# Patient Record
Sex: Male | Born: 1960
Health system: Southern US, Community
[De-identification: ages and names within clinical notes are randomized; demographics above are authoritative.]

## PROBLEM LIST (undated history)

## (undated) DIAGNOSIS — K219 Gastro-esophageal reflux disease without esophagitis: Secondary | ICD-10-CM

## (undated) DIAGNOSIS — E785 Hyperlipidemia, unspecified: Secondary | ICD-10-CM

## (undated) DIAGNOSIS — F319 Bipolar disorder, unspecified: Secondary | ICD-10-CM

## (undated) DIAGNOSIS — I10 Essential (primary) hypertension: Secondary | ICD-10-CM

## (undated) DIAGNOSIS — T7840XA Allergy, unspecified, initial encounter: Secondary | ICD-10-CM

## (undated) HISTORY — DX: Essential (primary) hypertension: I10

## (undated) HISTORY — PX: EYE SURGERY: SHX253

## (undated) HISTORY — DX: Hyperlipidemia, unspecified: E78.5

## (undated) HISTORY — PX: COLONOSCOPY: SHX174

## (undated) HISTORY — DX: Bipolar disorder, unspecified: F31.9

## (undated) HISTORY — DX: Gastro-esophageal reflux disease without esophagitis: K21.9

## (undated) HISTORY — PX: UPPER GASTROINTESTINAL ENDOSCOPY: SHX188

## (undated) HISTORY — DX: Allergy, unspecified, initial encounter: T78.40XA

---

## 1995-11-17 HISTORY — PX: BACK SURGERY: SHX140

## 2002-02-25 ENCOUNTER — Encounter: Payer: Self-pay | Admitting: Internal Medicine

## 2007-04-19 HISTORY — PX: HERNIA REPAIR: SHX51

## 2007-05-29 ENCOUNTER — Ambulatory Visit: Payer: Self-pay | Admitting: Internal Medicine

## 2007-05-29 DIAGNOSIS — J329 Chronic sinusitis, unspecified: Secondary | ICD-10-CM | POA: Insufficient documentation

## 2007-05-29 DIAGNOSIS — M545 Low back pain, unspecified: Secondary | ICD-10-CM | POA: Insufficient documentation

## 2007-05-29 DIAGNOSIS — K219 Gastro-esophageal reflux disease without esophagitis: Secondary | ICD-10-CM | POA: Insufficient documentation

## 2007-05-29 DIAGNOSIS — F339 Major depressive disorder, recurrent, unspecified: Secondary | ICD-10-CM | POA: Insufficient documentation

## 2007-05-29 DIAGNOSIS — K222 Esophageal obstruction: Secondary | ICD-10-CM | POA: Insufficient documentation

## 2007-05-29 DIAGNOSIS — E785 Hyperlipidemia, unspecified: Secondary | ICD-10-CM | POA: Insufficient documentation

## 2007-05-29 DIAGNOSIS — F329 Major depressive disorder, single episode, unspecified: Secondary | ICD-10-CM

## 2007-05-29 DIAGNOSIS — K449 Diaphragmatic hernia without obstruction or gangrene: Secondary | ICD-10-CM | POA: Insufficient documentation

## 2007-06-12 ENCOUNTER — Ambulatory Visit: Payer: Self-pay | Admitting: Internal Medicine

## 2007-06-12 ENCOUNTER — Encounter: Payer: Self-pay | Admitting: Internal Medicine

## 2007-08-27 ENCOUNTER — Telehealth: Payer: Self-pay | Admitting: Internal Medicine

## 2008-03-05 ENCOUNTER — Encounter: Payer: Self-pay | Admitting: Internal Medicine

## 2010-08-31 NOTE — Assessment & Plan Note (Signed)
[name]                         GASTROENTEROLOGY OFFICE NOTE   HAWKINS, SEAMAN                    MRN:          [id number]  DATE:05/29/2007                            DOB:          12-Apr-1961    CHIEF COMPLAINT:  Dysphagia, reflux.   ASSESSMENT:  Chronic recurrent reflux and dysphagia symptoms.  Sounds  like he has had some nocturnal aspiration.  He has a known ring-like  esophageal stricture dilated in 2003 as well as a small hiatal hernia.   PLAN:  1. Try Prilosec OTC b.i.d. to see if that improves things.  2. Schedule EGD with possible esophageal dilation.  I think stricture      dilation helped in the past for a while anyway.  I may need to try      to disrupt this membranous stricture with biopsy forceps, etc.  3. Reflux precautions including trying to avoid late meals (he works a      second shift and this is somewhat problematic).  Consider elevating      the head of the bed.  We may consider metoclopramide at that time.  4. May need to change his PPI depending upon his formulary and cost      effectiveness.   HISTORY:  [name] has known reflux and GERD with stricture.  He  is having recurrent problems where he chokes and aspirates occasionally.  This seems to be worsening. He had an episode one where they actually  called the rescue squad, though he did not go to the hospital but he had  very much in the way of difficulty with breathing.  He does describe  some vague impact dysphagia. He is avoiding spicy foods.  He drinks a  lot of caffeine, 5 or 6 cups of coffee a day. He does not smoke. He is  counseled to reduce the caffeine. He has had some periumbilical pain as  well.   When he gets home from work at about 1:15 he will often eat a bowl of  cereal, perhaps some more food and fall asleep on the couch.  The  Prilosec seems to work during the day but he thinks it seems to be  wearing off.  He had a brief sample of  Nexium which works better.  He  used to be on Protonix but that became too expensive and that was  stopped and changed to the Prilosec and it sounds like these symptoms  have worsened since that time.   REVIEW OF SYSTEMS:  He does attribute some stress about the current  financial climate, etc. to some of his symptoms.  He wears eye glasses.  He has had some sinusitis problems.  He has been a little anxious as  well. Had some sore throat and chronic low back pain treated with  Neurontin.   PAST MEDICAL HISTORY:  1. Depression.  2. Chronic back pain.  3. Sinusitis.  4. Dyslipidemia.  5. Hiatal hernia with esophageal stricture.  6. Gastroesophageal reflux disease.   MEDICATIONS:  1. Neurontin.  2. Lithobid.  3. Prilosec 20 mg daily.  4. Fish oil  daily.   FAMILY HISTORY:  Mother had breast cancer.  Lung cancer in the family as  well.  No colon cancer.   SOCIAL HISTORY:  He is married.  He is a Curator at [employer].  Three sons,  two daughters.  Lives with his wife, two daughters and a grandchild.  No  alcohol, tobacco or drugs.   PHYSICAL EXAMINATION:  Physical exam reveals a well-developed, well-  nourished white man.  Height 6 feet.  Weight 223 pounds.  Blood pressure  132/92. Pulse 60.  He is at least overweight.  The eyes are anicteric.  Mouth, posterior pharynx free of lesions.  Dentition in good repair.  The neck is supple without thyromegaly or masses.  The lungs are clear  and resonant.  The heart, S1, S2.  I hear no murmurs, rubs or gallops.  The abdomen is soft, nontender.  No organomegaly or masses.  He is  somewhat ticklish on exam.  Lymphatics with no neck or supraclavicular  nodes palpated.  Lower extremities free of edema.  Skin of the upper  trunk is without rash.  Neurological with cranial nerves II-XII intact.  Psychiatric he is alert and orient x3.  Appropriate affect.   I have reviewed the office notes sent from Dr. Kathi Der office.  They  describe his  problems as I have described above.  He was seen on May 02, 2007.  He was treated for sinusitis at that time.  He was given Depo-  Medrol, Omnicef and Mucinex for a while and that is when he received the  samples of Nexium.   I appreciate the opportunity to care for this patient.     Iva Boop, MD,FACG  Electronically Signed    CEG/MedQ  DD: 05/29/2007  DT: 05/30/2007  Job #: [id number]   cc:   Ernestina Penna, M.D.

## 2012-08-17 ENCOUNTER — Other Ambulatory Visit: Payer: Self-pay | Admitting: Family Medicine

## 2012-09-19 ENCOUNTER — Encounter: Payer: Self-pay | Admitting: Family Medicine

## 2012-11-01 ENCOUNTER — Other Ambulatory Visit: Payer: Self-pay

## 2012-11-01 MED ORDER — METAXALONE 800 MG PO TABS: 800.0000 mg | ORAL_TABLET | Freq: Three times a day (TID) | ORAL | Status: DC

## 2012-11-01 NOTE — Telephone Encounter (Signed)
Last seen 05/22/12  DWM 

## 2012-11-14 ENCOUNTER — Other Ambulatory Visit: Payer: Self-pay | Admitting: Family Medicine

## 2012-11-16 ENCOUNTER — Other Ambulatory Visit (INDEPENDENT_AMBULATORY_CARE_PROVIDER_SITE_OTHER): Payer: Managed Care, Other (non HMO)

## 2012-11-16 DIAGNOSIS — Z79899 Other long term (current) drug therapy: Secondary | ICD-10-CM

## 2012-11-16 DIAGNOSIS — E785 Hyperlipidemia, unspecified: Secondary | ICD-10-CM

## 2012-11-16 DIAGNOSIS — E559 Vitamin D deficiency, unspecified: Secondary | ICD-10-CM

## 2012-11-16 LAB — POCT CBC
HCT, POC: 42.6 % — AB (ref 43.5–53.7)
Lymph, poc: 1.8 (ref 0.6–3.4)
MCH, POC: 29.2 pg (ref 27–31.2)
MCV: 87.1 fL (ref 80–97)
Platelet Count, POC: 241 10*3/uL (ref 142–424)
RBC: 4.9 M/uL (ref 4.69–6.13)
RDW, POC: 13.7 %
WBC: 8.3 10*3/uL (ref 4.6–10.2)

## 2012-11-16 NOTE — Progress Notes (Signed)
Patient came in for labs only.

## 2012-11-16 NOTE — Telephone Encounter (Signed)
Last seen 05/22/12  DWM

## 2012-11-17 LAB — NMR, LIPOPROFILE
HDL Cholesterol by NMR: 35 mg/dL — ABNORMAL LOW (ref >=40)
HDL Particle Number: 27.6 umol/L — ABNORMAL LOW (ref >=30.5)
LDL Size: 19.6 nm — ABNORMAL LOW (ref >20.5)
LP-IR Score: 73 — ABNORMAL HIGH (ref <=45)
Small LDL Particle Number: 1508 nmol/L — ABNORMAL HIGH (ref <=527)

## 2012-11-17 LAB — BMP8+EGFR
BUN/Creatinine Ratio: 14 (ref 9–20)
BUN: 14 mg/dL (ref 6–24)
CO2: 24 mmol/L (ref 18–29)
Creatinine, Ser: 1 mg/dL (ref 0.76–1.27)
GFR calc Af Amer: 100 mL/min/{1.73_m2} (ref >59)
Sodium: 142 mmol/L (ref 134–144)

## 2012-11-17 LAB — HEPATIC FUNCTION PANEL
ALT: 30 IU/L (ref 0–44)
AST: 39 IU/L (ref 0–40)
Albumin: 4.6 g/dL (ref 3.5–5.5)
Alkaline Phosphatase: 64 IU/L (ref 39–117)
Total Bilirubin: 0.4 mg/dL (ref 0.0–1.2)

## 2012-11-17 LAB — VITAMIN D 25 HYDROXY (VIT D DEFICIENCY, FRACTURES): Vit D, 25-Hydroxy: 32.5 ng/mL (ref 30.0–100.0)

## 2012-11-20 ENCOUNTER — Telehealth: Payer: Self-pay | Admitting: *Deleted

## 2012-11-20 NOTE — Telephone Encounter (Signed)
Pt notified of results

## 2012-11-20 NOTE — Telephone Encounter (Signed)
Message copied by Bearl Mulberry on Tue Nov 20, 2012  6:00 PM ------      Message from: Ernestina Penna      Created: Sat Nov 17, 2012  8:47 PM       Blood sugar renal and electrolytes were all within normal limit      Liver Function tests were within normal limit      ON Advanced lipid testing the total LDL particle number was very elevated at 1729. This number should be less than 1000. Triglycerides were very elevated at 281. The HDL particle number was low. Small particle number was high.----------- confirm whether or not patient is adhering to diet and exercise.Confirm current medication-------If not taking anything , he should start crestor 10 one daily. Give him sample of medication which has coupon card in it. LFTs should be checked in 4 weeks. He must do better with therapeutic lifestyle changes which include diet and exercise.      Vitamin D level is low at 32.5------- confirm current dose of vitamin D. He should increase vitamin D3 x 1000 daily ------

## 2012-11-26 ENCOUNTER — Telehealth: Payer: Self-pay | Admitting: Family Medicine

## 2012-11-27 ENCOUNTER — Encounter: Payer: Self-pay | Admitting: Family Medicine

## 2012-11-27 ENCOUNTER — Ambulatory Visit (INDEPENDENT_AMBULATORY_CARE_PROVIDER_SITE_OTHER): Payer: Managed Care, Other (non HMO) | Admitting: Family Medicine

## 2012-11-27 VITALS — BP 136/84 | HR 68 | Temp 97.8°F | Wt 222.2 lb

## 2012-11-27 DIAGNOSIS — J329 Chronic sinusitis, unspecified: Secondary | ICD-10-CM

## 2012-11-27 MED ORDER — METHYLPREDNISOLONE (PAK) 4 MG PO TABS: ORAL_TABLET | ORAL | Status: DC

## 2012-11-27 MED ORDER — AMOXICILLIN-POT CLAVULANATE 875-125 MG PO TABS: 1.0000 | ORAL_TABLET | Freq: Two times a day (BID) | ORAL | Status: DC

## 2012-11-27 NOTE — Patient Instructions (Signed)

## 2012-11-27 NOTE — Progress Notes (Signed)
  Subjective:    Patient ID: Melvin Roberts, male    DOB: 21-Sep-1960, 52 y.o.   MRN: [id number]  HPI This 52 y.o. male presents for evaluation of cough and congestion for a week. He has  Hx of sinus problems.   Review of Systems No chest pain, SOB, HA, dizziness, vision change, N/V, diarrhea, constipation, dysuria, urinary urgency or frequency, myalgias, arthralgias or rash.     Objective:   Physical Exam Vital signs noted  Well developed well nourished male.  HEENT - Head atraumatic Normocephalic                Eyes - PERRLA, Conjuctiva - clear Sclera- Clear EOMI                Ears - EAC's Wnl TM's Wnl Gross Hearing WNL                Nose - Nares patent                 Throat - oropharanx wnl Respiratory - Lungs CTA bilateral Cardiac - RRR S1 and S2 without murmur GI - Abdomen soft Nontender and bowel sounds active x 4 Extremities - No edema. Neuro - Grossly intact.       Assessment & Plan:  Sinusitis - Plan: amoxicillin-clavulanate (AUGMENTIN) 875-125 MG per tablet, methylPREDNIsolone (MEDROL DOSPACK) 4 MG tablet.

## 2012-11-29 ENCOUNTER — Ambulatory Visit: Payer: Self-pay | Admitting: Family Medicine

## 2012-12-18 ENCOUNTER — Other Ambulatory Visit: Payer: Self-pay | Admitting: Family Medicine

## 2012-12-20 NOTE — Telephone Encounter (Signed)
Last lithium level 12/28/11   DWM

## 2013-01-02 ENCOUNTER — Telehealth: Payer: Self-pay | Admitting: Family Medicine

## 2013-01-02 MED ORDER — AMOXICILLIN 500 MG PO CAPS: 500.0000 mg | ORAL_CAPSULE | Freq: Three times a day (TID) | ORAL | Status: DC

## 2013-01-02 NOTE — Addendum Note (Signed)
Addended by: Bearl Mulberry on: 01/02/2013 01:17 PM   Modules accepted: Orders

## 2013-01-02 NOTE — Telephone Encounter (Signed)
If not allergic to penicillin may call in amoxicillin 500 mg 3 times daily for 10 day

## 2013-01-02 NOTE — Telephone Encounter (Signed)
Pt having recurring sinus infection x 4 days Some ear pain Drainage(yellow-green) cough productive Wants RX Please call to advise

## 2013-01-02 NOTE — Telephone Encounter (Signed)
Pt notified RX sent in 

## 2013-01-04 NOTE — Telephone Encounter (Signed)
Be happy to see him.

## 2013-01-16 ENCOUNTER — Other Ambulatory Visit: Payer: Self-pay | Admitting: Family Medicine

## 2013-02-15 ENCOUNTER — Other Ambulatory Visit: Payer: Self-pay | Admitting: Family Medicine

## 2013-02-18 ENCOUNTER — Other Ambulatory Visit: Payer: Self-pay | Admitting: *Deleted

## 2013-02-18 NOTE — Telephone Encounter (Signed)
Patient last seen on 11-27-12 for an acute visit. Had labs done on 11-16-12. Do not see a lithium level. Please advise

## 2013-02-18 NOTE — Telephone Encounter (Signed)
This is okay for 6 month 

## 2013-03-11 ENCOUNTER — Ambulatory Visit (INDEPENDENT_AMBULATORY_CARE_PROVIDER_SITE_OTHER): Payer: Managed Care, Other (non HMO)

## 2013-03-11 ENCOUNTER — Ambulatory Visit (INDEPENDENT_AMBULATORY_CARE_PROVIDER_SITE_OTHER): Payer: Managed Care, Other (non HMO) | Admitting: Family Medicine

## 2013-03-11 ENCOUNTER — Encounter: Payer: Self-pay | Admitting: Family Medicine

## 2013-03-11 VITALS — BP 143/94 | HR 62 | Temp 97.1°F | Ht 71.0 in | Wt 229.0 lb

## 2013-03-11 DIAGNOSIS — N400 Enlarged prostate without lower urinary tract symptoms: Secondary | ICD-10-CM | POA: Insufficient documentation

## 2013-03-11 DIAGNOSIS — E559 Vitamin D deficiency, unspecified: Secondary | ICD-10-CM

## 2013-03-11 DIAGNOSIS — F329 Major depressive disorder, single episode, unspecified: Secondary | ICD-10-CM

## 2013-03-11 DIAGNOSIS — Z23 Encounter for immunization: Secondary | ICD-10-CM

## 2013-03-11 DIAGNOSIS — Z0000 Encounter for general adult medical examination without abnormal findings: Secondary | ICD-10-CM

## 2013-03-11 DIAGNOSIS — E785 Hyperlipidemia, unspecified: Secondary | ICD-10-CM

## 2013-03-11 DIAGNOSIS — J309 Allergic rhinitis, unspecified: Secondary | ICD-10-CM | POA: Insufficient documentation

## 2013-03-11 DIAGNOSIS — K219 Gastro-esophageal reflux disease without esophagitis: Secondary | ICD-10-CM

## 2013-03-11 DIAGNOSIS — J329 Chronic sinusitis, unspecified: Secondary | ICD-10-CM

## 2013-03-11 DIAGNOSIS — F319 Bipolar disorder, unspecified: Secondary | ICD-10-CM | POA: Insufficient documentation

## 2013-03-11 MED ORDER — FLUTICASONE PROPIONATE 50 MCG/ACT NA SUSP: NASAL | Status: DC

## 2013-03-11 NOTE — Progress Notes (Signed)
Subjective:    Patient ID: Melvin Roberts, male    DOB: 08/05/1960, 52 y.o.   MRN: [id number]  HPI Pt here for follow up and management of chronic medical problems AND complete wellness exam. Patient is getting made to have some surgery on his left knee. This is going to be an arthroscopy by Dr. Farris Has. He continues to have chronic sinus problems. Health maintenance he is also due to receive a colonoscopy, but prefers to wait until after his pending knee surgery in January.         Patient Active Problem List   Diagnosis Date Noted  . DYSLIPIDEMIA 05/29/2007  . DEPRESSION, CHRONIC 05/29/2007  . SINUSITIS, RECURRENT 05/29/2007  . ESOPHAGEAL STRICTURE 05/29/2007  . GERD 05/29/2007  . HIATAL HERNIA 05/29/2007  . LOW BACK PAIN, CHRONIC 05/29/2007   Outpatient Encounter Prescriptions as of 03/11/2013  Medication Sig  . cholecalciferol (VITAMIN D) 1000 UNITS tablet Take 1,000 Units by mouth daily.  Marland Kitchen lithium 300 MG tablet TAKE 1 TABLET 3 TIMES A DAY  . metaxalone (SKELAXIN) 800 MG tablet Take 1 tablet (800 mg total) by mouth 3 (three) times daily.  . rosuvastatin (CRESTOR) 10 MG tablet Take 10 mg by mouth daily.  . fish oil-omega-3 fatty acids 1000 MG capsule Take 1 g by mouth daily.  . [DISCONTINUED] amoxicillin (AMOXIL) 500 MG capsule Take 1 capsule (500 mg total) by mouth 3 (three) times daily.  . [DISCONTINUED] amoxicillin-clavulanate (AUGMENTIN) 875-125 MG per tablet Take 1 tablet by mouth 2 (two) times daily.  . [DISCONTINUED] methylPREDNIsolone (MEDROL DOSPACK) 4 MG tablet follow package directions    Review of Systems  Constitutional: Negative.   HENT: Positive for sinus pressure (frequent sinus infections).   Eyes: Negative.   Respiratory: Negative.   Cardiovascular: Negative.   Gastrointestinal: Negative.   Endocrine: Negative.   Genitourinary: Negative.   Musculoskeletal: Positive for arthralgias (left knee pain).  Skin: Negative.   Allergic/Immunologic: Negative.    Neurological: Negative.   Hematological: Negative.   Psychiatric/Behavioral: Negative.        Objective:   Physical Exam  Nursing note and vitals reviewed. Constitutional: He is oriented to person, place, and time. He appears well-developed and well-nourished.  HENT:  Head: Normocephalic and atraumatic.  Right Ear: External ear normal.  Left Ear: External ear normal.  Nose: Nose normal.  Mouth/Throat: Oropharynx is clear and moist. No oropharyngeal exudate.  Eyes: Conjunctivae and EOM are normal. Pupils are equal, round, and reactive to light. Right eye exhibits no discharge. Left eye exhibits no discharge. No scleral icterus.  Neck: Normal range of motion. Neck supple. No tracheal deviation present. No thyromegaly present.  No carotid bruits  Cardiovascular: Normal rate, regular rhythm, normal heart sounds and intact distal pulses.  Exam reveals no gallop and no friction rub.   No murmur heard. At 60 per minute  Pulmonary/Chest: Effort normal and breath sounds normal. No respiratory distress. He has no wheezes. He has no rales. He exhibits no tenderness.  Abdominal: Soft. Bowel sounds are normal. He exhibits no mass. There is no tenderness. There is no rebound and no guarding.  Genitourinary: Rectum normal and penis normal.  Prostate was slightly enlarged bilaterally but smooth without polyps. No rectal masses. No inguinal hernia normal external genitalia  Musculoskeletal: Normal range of motion. He exhibits no edema and no tenderness.  Some discomfort left knee pain pending arthroscopy  Lymphadenopathy:    He has no cervical adenopathy.  Neurological: He is alert and oriented  to person, place, and time. He has normal reflexes. No cranial nerve deficit.  Skin: Skin is warm and dry. No rash noted. No erythema. No pallor.  Psychiatric: He has a normal mood and affect. His behavior is normal. Judgment and thought content normal.   BP 143/94  Pulse 62  Temp(Src) 97.1 F (36.2 C)  (Oral)  Ht 5\' 11"  (1.803 m)  Wt 229 lb (103.874 kg)  BMI 31.95 kg/m2         Assessment & Plan:   1. Annual physical exam   2. DYSLIPIDEMIA   3. GERD   4. DEPRESSION, CHRONIC   5. Vitamin D deficiency   6. Bipolar disorder   7. Allergic rhinitis   8. BPH (benign prostatic hyperplasia)    Orders Placed This Encounter  Procedures  . DG Chest 2 View    Standing Status: Future     Number of Occurrences: 1     Standing Expiration Date: 05/11/2014    Order Specific Question:  Reason for Exam (SYMPTOM  OR DIAGNOSIS REQUIRED)    Answer:  annual    Order Specific Question:  Preferred imaging location?    Answer:  Internal  . Hepatic function panel    Standing Status: Future     Number of Occurrences:      Standing Expiration Date: 03/11/2014  . BMP8+EGFR    Standing Status: Future     Number of Occurrences:      Standing Expiration Date: 03/11/2014  . NMR, lipoprofile    Standing Status: Future     Number of Occurrences:      Standing Expiration Date: 03/11/2014  . Thyroid Panel With TSH    Standing Status: Future     Number of Occurrences:      Standing Expiration Date: 03/11/2014  . Vit D  25 hydroxy (rtn osteoporosis monitoring)    Standing Status: Future     Number of Occurrences:      Standing Expiration Date: 03/11/2014  . PSA, total and free    Standing Status: Future     Number of Occurrences:      Standing Expiration Date: 03/11/2014  . POCT CBC    Standing Status: Future     Number of Occurrences:      Standing Expiration Date: 04/10/2013   Meds ordered this encounter  Medications  . cholecalciferol (VITAMIN D) 1000 UNITS tablet    Sig: Take 1,000 Units by mouth daily.  . fluticasone (FLONASE) 50 MCG/ACT nasal spray    Sig: 1-2 sprays in each nostril daily at bedtime    Dispense:  16 g    Refill:  6   Patient Instructions  Continue current medications. Continue good therapeutic lifestyle changes which include good diet and exercise. Fall  precautions discussed with patient. Schedule your flu vaccine if you haven't had it yet If you are over 51 years old - you may need Prevnar 13 or the adult Pneumonia vaccine. Please check to see if your insurance will pay for a Prevnar vaccine that is due at age 21 Remember, that you are due to get a colonoscopy for screening purposes. Also this winter always keep the house cooler, use a cool mist humidifier, and use saline nose sprays to help keep congestion loose Return the FOBT Use nose spray as directed Monitor blood pressures at home, bring readings for review in 3-4 weeks, a good blood pressure cuff at the drug store to purchase is omron,   Nyra Capes MD

## 2013-03-11 NOTE — Patient Instructions (Addendum)
Continue current medications. Continue good therapeutic lifestyle changes which include good diet and exercise. Fall precautions discussed with patient. Schedule your flu vaccine if you haven't had it yet If you are over 51 years old - you may need Prevnar 13 or the adult Pneumonia vaccine. Please check to see if your insurance will pay for a Prevnar vaccine that is due at age 65 Remember, that you are due to get a colonoscopy for screening purposes. Also this winter always keep the house cooler, use a cool mist humidifier, and use saline nose sprays to help keep congestion loose Return the FOBT Use nose spray as directed Monitor blood pressures at home, bring readings for review in 3-4 weeks, a good blood pressure cuff at the drug store to purchase is omron,

## 2013-03-11 NOTE — Addendum Note (Signed)
Addended by: Magdalene River on: 03/11/2013 11:58 AM   Modules accepted: Orders

## 2013-03-18 ENCOUNTER — Other Ambulatory Visit: Payer: Self-pay | Admitting: Family Medicine

## 2013-03-19 NOTE — Telephone Encounter (Signed)
Last seen 03/11/13  DWM

## 2013-03-21 ENCOUNTER — Other Ambulatory Visit (INDEPENDENT_AMBULATORY_CARE_PROVIDER_SITE_OTHER): Payer: Managed Care, Other (non HMO)

## 2013-03-21 DIAGNOSIS — Z1212 Encounter for screening for malignant neoplasm of rectum: Secondary | ICD-10-CM

## 2013-03-21 NOTE — Progress Notes (Signed)
Pt came in for labs only 

## 2013-03-22 ENCOUNTER — Other Ambulatory Visit (INDEPENDENT_AMBULATORY_CARE_PROVIDER_SITE_OTHER): Payer: Managed Care, Other (non HMO)

## 2013-03-22 DIAGNOSIS — Z0000 Encounter for general adult medical examination without abnormal findings: Secondary | ICD-10-CM

## 2013-03-22 DIAGNOSIS — K219 Gastro-esophageal reflux disease without esophagitis: Secondary | ICD-10-CM

## 2013-03-22 DIAGNOSIS — F329 Major depressive disorder, single episode, unspecified: Secondary | ICD-10-CM

## 2013-03-22 DIAGNOSIS — E785 Hyperlipidemia, unspecified: Secondary | ICD-10-CM

## 2013-03-22 DIAGNOSIS — F3289 Other specified depressive episodes: Secondary | ICD-10-CM

## 2013-03-22 DIAGNOSIS — E559 Vitamin D deficiency, unspecified: Secondary | ICD-10-CM

## 2013-03-22 LAB — POCT CBC
Granulocyte percent: 75.3 %G (ref 37–80)
Lymph, poc: 1.7 (ref 0.6–3.4)
POC Granulocyte: 5.7 (ref 2–6.9)
Platelet Count, POC: 228 10*3/uL (ref 142–424)
RDW, POC: 13.4 %
WBC: 7.6 10*3/uL (ref 4.6–10.2)

## 2013-03-22 NOTE — Progress Notes (Signed)
PATIENT CAME IN FOR LABS ONLY 

## 2013-03-24 LAB — BMP8+EGFR
Calcium: 9.6 mg/dL (ref 8.7–10.2)
Creatinine, Ser: 1.11 mg/dL (ref 0.76–1.27)
GFR calc Af Amer: 88 mL/min/{1.73_m2} (ref >59)
GFR calc non Af Amer: 76 mL/min/{1.73_m2} (ref >59)
Glucose: 93 mg/dL (ref 65–99)
Potassium: 4.6 mmol/L (ref 3.5–5.2)

## 2013-03-24 LAB — HEPATIC FUNCTION PANEL
ALT: 33 IU/L (ref 0–44)
AST: 31 IU/L (ref 0–40)
Bilirubin, Direct: 0.1 mg/dL (ref 0.00–0.40)
Total Bilirubin: 0.3 mg/dL (ref 0.0–1.2)
Total Protein: 6.6 g/dL (ref 6.0–8.5)

## 2013-03-24 LAB — VITAMIN D 25 HYDROXY (VIT D DEFICIENCY, FRACTURES): Vit D, 25-Hydroxy: 35.2 ng/mL (ref 30.0–100.0)

## 2013-03-24 LAB — NMR, LIPOPROFILE
Cholesterol: 159 mg/dL (ref <200)
HDL Cholesterol by NMR: 35 mg/dL — ABNORMAL LOW (ref >=40)
HDL Particle Number: 27 umol/L — ABNORMAL LOW (ref >=30.5)
LDL Particle Number: 1246 nmol/L — ABNORMAL HIGH (ref <1000)
LDL Size: 19.7 nm — ABNORMAL LOW (ref >20.5)
LDLC SERPL CALC-MCNC: 59 mg/dL (ref <100)
Triglycerides by NMR: 323 mg/dL — ABNORMAL HIGH (ref <150)

## 2013-03-24 LAB — THYROID PANEL WITH TSH
Free Thyroxine Index: 1.7 (ref 1.2–4.9)
T3 Uptake Ratio: 26 % (ref 24–39)
T4, Total: 6.7 ug/dL (ref 4.5–12.0)

## 2013-03-24 LAB — PSA, TOTAL AND FREE
PSA, Free: 0.31 ng/mL
PSA: 0.8 ng/mL (ref 0.0–4.0)

## 2013-03-25 ENCOUNTER — Encounter: Payer: Self-pay | Admitting: *Deleted

## 2013-03-25 NOTE — Progress Notes (Signed)
Quick Note:  Copy of labs sent to patient ______ 

## 2013-03-27 ENCOUNTER — Telehealth: Payer: Self-pay | Admitting: *Deleted

## 2013-03-27 NOTE — Telephone Encounter (Signed)
Need to go over labs 

## 2013-03-27 NOTE — Telephone Encounter (Signed)
Message copied by Magdalene River on Wed Mar 27, 2013  2:57 PM ------      Message from: Ernestina Penna      Created: Fri Mar 22, 2013  1:38 PM       The CBC has a normal white blood cell count. The hemoglobin is slightly decreased from before at 13.8, but this is okay. The platelet count is adequate ------

## 2013-04-09 ENCOUNTER — Other Ambulatory Visit: Payer: Self-pay | Admitting: Family Medicine

## 2013-04-25 ENCOUNTER — Other Ambulatory Visit: Payer: Self-pay | Admitting: *Deleted

## 2013-04-25 ENCOUNTER — Encounter: Payer: Self-pay | Admitting: *Deleted

## 2013-04-25 MED ORDER — ANTARA 30 MG PO CAPS: 30.0000 mg | ORAL_CAPSULE | Freq: Every day | ORAL | Status: DC

## 2013-04-25 NOTE — Progress Notes (Signed)
I reviewed you recommendations on his recent labs but I needed clarification. Do you want to add Antara to the Crestor or replace Crestor with Antara?

## 2013-04-25 NOTE — Progress Notes (Signed)
Add Antara to the Crestor

## 2013-05-01 NOTE — Telephone Encounter (Signed)
Will try to call again.  

## 2013-05-02 ENCOUNTER — Encounter: Payer: Self-pay | Admitting: *Deleted

## 2013-05-07 ENCOUNTER — Encounter: Payer: Self-pay | Admitting: Internal Medicine

## 2013-05-07 NOTE — Progress Notes (Signed)
   Subjective:    Patient ID: Melvin Roberts, male    DOB: 02/28/1961, 53 y.o.   MRN: [id number]  HPI    Review of Systems     Objective:   Physical Exam        Assessment & Plan:  erroneous

## 2013-05-27 ENCOUNTER — Ambulatory Visit (AMBULATORY_SURGERY_CENTER): Payer: Self-pay | Admitting: *Deleted

## 2013-05-27 VITALS — Ht 71.0 in | Wt 228.0 lb

## 2013-05-27 DIAGNOSIS — Z1211 Encounter for screening for malignant neoplasm of colon: Secondary | ICD-10-CM

## 2013-05-27 MED ORDER — NA SULFATE-K SULFATE-MG SULF 17.5-3.13-1.6 GM/177ML PO SOLN: ORAL | Status: DC

## 2013-05-29 ENCOUNTER — Encounter: Payer: Self-pay | Admitting: Internal Medicine

## 2013-06-05 ENCOUNTER — Encounter: Payer: Self-pay | Admitting: Internal Medicine

## 2013-06-07 ENCOUNTER — Encounter: Payer: Self-pay | Admitting: Internal Medicine

## 2013-06-07 ENCOUNTER — Ambulatory Visit (AMBULATORY_SURGERY_CENTER): Payer: BC Managed Care – PPO | Admitting: Internal Medicine

## 2013-06-07 VITALS — BP 137/85 | HR 55 | Temp 98.0°F | Resp 15 | Ht 71.0 in | Wt 228.0 lb

## 2013-06-07 DIAGNOSIS — Z1211 Encounter for screening for malignant neoplasm of colon: Secondary | ICD-10-CM

## 2013-06-07 MED ORDER — SODIUM CHLORIDE 0.9 % IV SOLN: 500.0000 mL | INTRAVENOUS | Status: DC

## 2013-06-07 NOTE — Op Note (Signed)
[facility] [address] [place], [id number]   COLONOSCOPY PROCEDURE REPORT  PATIENT: Melvin BallerKalinowski, Melvin D.  MR#: [id number] BIRTHDATE: 1960/08/11 , 52  yrs. old GENDER: Male ENDOSCOPIST: Iva Booparl E Ashlea Dusing, MD, [facility] PROCEDURE DATE:  06/07/2013 PROCEDURE:   Colonoscopy, screening First Screening Colonoscopy - Avg.  risk and is 50 yrs.  old or older Yes.  Prior Negative Screening - Now for repeat screening. N/A  History of Adenoma - Now for follow-up colonoscopy & has been > or = to 3 yrs.  N/A  Polyps Removed Today? No.  Recommend repeat exam, <10 yrs? No. ASA CLASS:   Class II INDICATIONS:average risk screening and first colonoscopy. MEDICATIONS: Propofol (Diprivan) 260 mg IV, MAC sedation, administered by CRNA, and These medications were titrated to patient response per physician's verbal order  DESCRIPTION OF PROCEDURE:   After the risks benefits and alternatives of the procedure were thoroughly explained, informed consent was obtained.  A digital rectal exam revealed no abnormalities of the rectum, A digital rectal exam revealed no prostatic nodules, and A digital rectal exam revealed the prostate was not enlarged.   The LB [id number]  endoscope was introduced through the anus and advanced to the cecum, which was identified by both the appendix and ileocecal valve. No adverse events experienced.   The quality of the prep was Suprep good  The instrument was then slowly withdrawn as the colon was fully examined.      COLON FINDINGS: A normal appearing cecum, ileocecal valve, and appendiceal orifice were identified.  The ascending, hepatic flexure, transverse, splenic flexure, descending, sigmoid colon and rectum appeared unremarkable.  No polyps or cancers were seen.   A right colon retroflexion was performed.  Retroflexed views revealed no abnormalities. The time to cecum=1 minutes 58 seconds. Withdrawal time=10 minutes 54 seconds.  The scope was  withdrawn and the procedure completed. COMPLICATIONS: There were no complications.  ENDOSCOPIC IMPRESSION: Normal colonoscopy - good prep - first screening  RECOMMENDATIONS: Repeat colonoscopy 10 years - 2025   eSigned:  Iva Booparl E Jamesrobert Ohanesian, MD, [facility] 06/07/2013 4:49 PM   cc: Rudi Heaponald Moore, MD and The Patient

## 2013-06-07 NOTE — Progress Notes (Signed)
Procedure ends, to recvery, report given and VSS.

## 2013-06-07 NOTE — Patient Instructions (Addendum)
No polyps or cancer detected!  Next routine colonoscopy in 10 years - 2025  I appreciate the opportunity to care for you. Iva Booparl E. Julissa Browning, MD, FACG  YOU HAD AN ENDOSCOPIC PROCEDURE TODAY AT THE  ENDOSCOPY CENTER: Refer to the procedure report that was given to you for any specific questions about what was found during the examination.  If the procedure report does not answer your questions, please call your gastroenterologist to clarify.  If you requested that your care partner not be given the details of your procedure findings, then the procedure report has been included in a sealed envelope for you to review at your convenience later.  YOU SHOULD EXPECT: Some feelings of bloating in the abdomen. Passage of more gas than usual.  Walking can help get rid of the air that was put into your GI tract during the procedure and reduce the bloating. If you had a lower endoscopy (such as a colonoscopy or flexible sigmoidoscopy) you may notice spotting of blood in your stool or on the toilet paper. If you underwent a bowel prep for your procedure, then you may not have a normal bowel movement for a few days.  DIET: Your first meal following the procedure should be a light meal and then it is ok to progress to your normal diet.  A half-sandwich or bowl of soup is an example of a good first meal.  Heavy or fried foods are harder to digest and may make you feel nauseous or bloated.  Likewise meals heavy in dairy and vegetables can cause extra gas to form and this can also increase the bloating.  Drink plenty of fluids but you should avoid alcoholic beverages for 24 hours.  ACTIVITY: Your care partner should take you home directly after the procedure.  You should plan to take it easy, moving slowly for the rest of the day.  You can resume normal activity the day after the procedure however you should NOT DRIVE or use heavy machinery for 24 hours (because of the sedation medicines used during the test).     SYMPTOMS TO REPORT IMMEDIATELY: A gastroenterologist can be reached at any hour.  During normal business hours, 8:30 AM to 5:00 PM Monday through Friday, call [phone].  After hours and on weekends, please call the GI answering service at [phone] who will take a message and have the physician on call contact you.   Following lower endoscopy (colonoscopy or flexible sigmoidoscopy):  Excessive amounts of blood in the stool  Significant tenderness or worsening of abdominal pains  Swelling of the abdomen that is new, acute  Fever of 100F or higher  FOLLOW UP: If any biopsies were taken you will be contacted by phone or by letter within the next 1-3 weeks.  Call your gastroenterologist if you have not heard about the biopsies in 3 weeks.  Our staff will call the home number listed on your records the next business day following your procedure to check on you and address any questions or concerns that you may have at that time regarding the information given to you following your procedure. This is a courtesy call and so if there is no answer at the home number and we have not heard from you through the emergency physician on call, we will assume that you have returned to your regular daily activities without incident.  SIGNATURES/CONFIDENTIALITY: You and/or your care partner have signed paperwork which will be entered into your electronic medical record.  These  signatures attest to the fact that that the information above on your After Visit Summary has been reviewed and is understood.  Full responsibility of the confidentiality of this discharge information lies with you and/or your care-partner.  Recommendations Repeat colonoscopy in 10 years-2025

## 2013-06-10 ENCOUNTER — Telehealth: Payer: Self-pay | Admitting: *Deleted

## 2013-06-10 NOTE — Telephone Encounter (Signed)
  Follow up Call-  Call back number 06/07/2013  Post procedure Call Back phone  # [phone]  Permission to leave phone message Yes     Patient questions:  Do you have a fever, pain , or abdominal swelling? no Pain Score  0 *  Have you tolerated food without any problems? yes  Have you been able to return to your normal activities? yes  Do you have any questions about your discharge instructions: Diet   no Medications  no Follow up visit  no  Do you have questions or concerns about your Care? no  Actions: * If pain score is 4 or above: No action needed, pain <4.

## 2013-06-15 ENCOUNTER — Other Ambulatory Visit: Payer: Self-pay | Admitting: Family Medicine

## 2013-06-17 ENCOUNTER — Telehealth: Payer: Self-pay | Admitting: Family Medicine

## 2013-06-17 ENCOUNTER — Encounter: Payer: Self-pay | Admitting: *Deleted

## 2013-06-17 ENCOUNTER — Ambulatory Visit (INDEPENDENT_AMBULATORY_CARE_PROVIDER_SITE_OTHER): Payer: BC Managed Care – PPO

## 2013-06-17 ENCOUNTER — Ambulatory Visit (INDEPENDENT_AMBULATORY_CARE_PROVIDER_SITE_OTHER): Payer: BC Managed Care – PPO | Admitting: Family Medicine

## 2013-06-17 ENCOUNTER — Encounter: Payer: Self-pay | Admitting: Family Medicine

## 2013-06-17 VITALS — BP 130/90 | HR 57 | Temp 97.9°F | Ht 71.0 in | Wt 219.0 lb

## 2013-06-17 DIAGNOSIS — S4350XA Sprain of unspecified acromioclavicular joint, initial encounter: Secondary | ICD-10-CM

## 2013-06-17 DIAGNOSIS — S46919A Strain of unspecified muscle, fascia and tendon at shoulder and upper arm level, unspecified arm, initial encounter: Secondary | ICD-10-CM

## 2013-06-17 DIAGNOSIS — M25512 Pain in left shoulder: Secondary | ICD-10-CM

## 2013-06-17 DIAGNOSIS — M25519 Pain in unspecified shoulder: Secondary | ICD-10-CM

## 2013-06-17 MED ORDER — MELOXICAM 15 MG PO TABS: ORAL_TABLET | ORAL | Status: DC

## 2013-06-17 NOTE — Telephone Encounter (Signed)
appt made for today 

## 2013-06-17 NOTE — Progress Notes (Signed)
Subjective:    Patient ID: Debroah BallerMike D Baughman, male    DOB: 04/08/1961, 53 y.o.   MRN: [id number]  HPI Patient here today for left shoulder pain- fell on ice yesterday morning.      Patient Active Problem List   Diagnosis Date Noted  . Bipolar disorder 03/11/2013  . Allergic rhinitis 03/11/2013  . BPH (benign prostatic hyperplasia) 03/11/2013  . DYSLIPIDEMIA 05/29/2007  . DEPRESSION, CHRONIC 05/29/2007  . SINUSITIS, RECURRENT 05/29/2007  . ESOPHAGEAL STRICTURE 05/29/2007  . GERD 05/29/2007  . HIATAL HERNIA 05/29/2007  . LOW BACK PAIN, CHRONIC 05/29/2007   Outpatient Encounter Prescriptions as of 06/17/2013  Medication Sig  . ANTARA 30 MG CAPS Take 30 mg by mouth daily.  . cholecalciferol (VITAMIN D) 1000 UNITS tablet Take 1,000 Units by mouth daily.  . fluticasone (FLONASE) 50 MCG/ACT nasal spray 1-2 sprays in each nostril daily at bedtime  . lithium 300 MG tablet TAKE 1 TABLET 3 TIMES A DAY  . metaxalone (SKELAXIN) 800 MG tablet TAKE 1 TABLET (800 MG TOTAL) BY MOUTH 3 (THREE) TIMES DAILY.  . Naproxen Sodium (ALEVE PO) Take 1-2 tablets by mouth as needed.  Marland Kitchen. omeprazole (PRILOSEC) 20 MG capsule Take 20 mg by mouth daily.  . rosuvastatin (CRESTOR) 10 MG tablet Take 10 mg by mouth daily.  . Zinc 50 MG CAPS Take 1 capsule by mouth daily.    Review of Systems  Constitutional: Negative.   HENT: Negative.   Eyes: Negative.   Respiratory: Negative.   Cardiovascular: Negative.   Gastrointestinal: Negative.   Endocrine: Negative.   Genitourinary: Negative.   Musculoskeletal: Positive for arthralgias (left shoulder pain).  Skin: Negative.   Allergic/Immunologic: Negative.   Neurological: Negative.   Hematological: Negative.   Psychiatric/Behavioral: Negative.        Objective:   Physical Exam  Nursing note and vitals reviewed. Constitutional: He is oriented to person, place, and time. He appears well-developed and well-nourished. No distress.  HENT:  Head: Normocephalic  and atraumatic.  Eyes: Conjunctivae and EOM are normal. Pupils are equal, round, and reactive to light. Right eye exhibits no discharge. Left eye exhibits no discharge.  Neck: Normal range of motion. Neck supple.  Musculoskeletal: Normal range of motion.  Tender at left a.c. joint  Neurological: He is alert and oriented to person, place, and time.  Skin: Skin is warm and dry. No rash noted.  Psychiatric: He has a normal mood and affect. His behavior is normal. Thought content normal.   BP 130/90  Pulse 57  Temp(Src) 97.9 F (36.6 C) (Oral)  Ht 5\' 11"  (1.803 m)  Wt 219 lb (99.338 kg)  BMI 30.56 kg/m2   [facility] reading (PRIMARY) by  Dr.Moore-no acute findings of left shoulder                                       Assessment & Plan:  1. Left shoulder pain - DG Shoulder Left; Future - meloxicam (MOBIC) 15 MG tablet; One half to one daily after eating as directed  Dispense: 30 tablet; Refill: 0  2. Strain of acromioclavicular joint  Patient Instructions  Use ice off and on for the next 24 hours then change to warm wet compresses 20 minutes 3 or 4 times daily to the affected area Take medication as directed Hold the cholesterol medicine and the antara while you are taking the meloxicam Rest the shoulder  for one to 2 days   Nyra Capes MD

## 2013-06-17 NOTE — Patient Instructions (Signed)
Use ice off and on for the next 24 hours then change to warm wet compresses 20 minutes 3 or 4 times daily to the affected area Take medication as directed Hold the cholesterol medicine and the antara while you are taking the meloxicam Rest the shoulder for one to 2 days

## 2013-06-18 NOTE — Telephone Encounter (Signed)
Last seen 06/17/13  DWM

## 2013-06-20 ENCOUNTER — Other Ambulatory Visit: Payer: Self-pay | Admitting: Family Medicine

## 2013-07-24 ENCOUNTER — Other Ambulatory Visit: Payer: Self-pay | Admitting: *Deleted

## 2013-07-24 MED ORDER — LISINOPRIL 10 MG PO TABS: 10.0000 mg | ORAL_TABLET | Freq: Every day | ORAL | Status: DC

## 2013-08-26 ENCOUNTER — Other Ambulatory Visit (INDEPENDENT_AMBULATORY_CARE_PROVIDER_SITE_OTHER): Payer: BC Managed Care – PPO

## 2013-08-26 DIAGNOSIS — K219 Gastro-esophageal reflux disease without esophagitis: Secondary | ICD-10-CM

## 2013-08-26 DIAGNOSIS — E559 Vitamin D deficiency, unspecified: Secondary | ICD-10-CM

## 2013-08-26 DIAGNOSIS — E785 Hyperlipidemia, unspecified: Secondary | ICD-10-CM

## 2013-08-26 DIAGNOSIS — N400 Enlarged prostate without lower urinary tract symptoms: Secondary | ICD-10-CM

## 2013-08-26 LAB — POCT CBC
Granulocyte percent: 77.9 %G (ref 37–80)
HEMATOCRIT: 40.2 % — AB (ref 43.5–53.7)
HEMOGLOBIN: 12.8 g/dL — AB (ref 14.1–18.1)
Lymph, poc: 1.6 (ref 0.6–3.4)
MCH: 28.3 pg (ref 27–31.2)
MCHC: 31.9 g/dL (ref 31.8–35.4)
MCV: 88.5 fL (ref 80–97)
MPV: 8 fL (ref 0–99.8)
POC Granulocyte: 5.8 (ref 2–6.9)
POC LYMPH PERCENT: 21 %L (ref 10–50)
Platelet Count, POC: 242 10*3/uL (ref 142–424)
RBC: 4.6 M/uL — AB (ref 4.69–6.13)
RDW, POC: 14.4 %
WBC: 7.5 10*3/uL (ref 4.6–10.2)

## 2013-08-26 NOTE — Progress Notes (Signed)
Pt came in for labs only 

## 2013-08-27 ENCOUNTER — Telehealth: Payer: Self-pay | Admitting: Family Medicine

## 2013-08-27 LAB — NMR, LIPOPROFILE
Cholesterol: 148 mg/dL (ref 100–199)
HDL Cholesterol by NMR: 35 mg/dL — ABNORMAL LOW (ref >39)
HDL Particle Number: 32 umol/L (ref >=30.5)
LDL Particle Number: 1074 nmol/L — ABNORMAL HIGH (ref <1000)
LDL Size: 20 nm (ref >20.5)
LDLC SERPL CALC-MCNC: 72 mg/dL (ref 0–99)
LP-IR Score: 83 — ABNORMAL HIGH (ref <=45)
Small LDL Particle Number: 793 nmol/L — ABNORMAL HIGH (ref <=527)
TRIGLYCERIDES BY NMR: 206 mg/dL — AB (ref 0–149)

## 2013-08-27 LAB — BMP8+EGFR
BUN / CREAT RATIO: 16 (ref 9–20)
BUN: 18 mg/dL (ref 6–24)
CALCIUM: 9.8 mg/dL (ref 8.7–10.2)
CO2: 23 mmol/L (ref 18–29)
CREATININE: 1.1 mg/dL (ref 0.76–1.27)
Chloride: 104 mmol/L (ref 97–108)
GFR, EST AFRICAN AMERICAN: 89 mL/min/{1.73_m2} (ref >59)
GFR, EST NON AFRICAN AMERICAN: 77 mL/min/{1.73_m2} (ref >59)
Glucose: 101 mg/dL — ABNORMAL HIGH (ref 65–99)
Potassium: 4.8 mmol/L (ref 3.5–5.2)
Sodium: 140 mmol/L (ref 134–144)

## 2013-08-27 LAB — HEPATIC FUNCTION PANEL
ALT: 28 IU/L (ref 0–44)
AST: 31 IU/L (ref 0–40)
Albumin: 4.7 g/dL (ref 3.5–5.5)
Alkaline Phosphatase: 60 IU/L (ref 39–117)
BILIRUBIN DIRECT: 0.1 mg/dL (ref 0.00–0.40)
Total Bilirubin: 0.2 mg/dL (ref 0.0–1.2)
Total Protein: 6.7 g/dL (ref 6.0–8.5)

## 2013-08-27 LAB — VITAMIN D 25 HYDROXY (VIT D DEFICIENCY, FRACTURES): Vit D, 25-Hydroxy: 41.7 ng/mL (ref 30.0–100.0)

## 2013-08-27 NOTE — Telephone Encounter (Signed)
Message copied by Azalee CourseFULP, Gearldene Fiorenza on Tue Aug 27, 2013  9:29 AM ------      Message from: Ernestina PennaMOORE, DONALD W      Created: Tue Aug 27, 2013  8:41 AM       The blood sugar slightly elevated at 101. The creatinine, the most important kidney function test is within normal limits. The electrolytes including potassium were within normal limits.      Liver function tests are within normal limits      With advanced lipid testing, a total LDL particle number is improved at lower than it was 5 months ago. It is now 1074. Previously it was 1246. The LDL C. is also good at 72. The triglycerides remain elevated but not as high as they were 5 months ago. They're now 206. The patient should continue Antara and Crestor and aggressive therapeutic lifestyle changes       The vitamin D level is good, continue current treatment ------

## 2013-09-10 ENCOUNTER — Ambulatory Visit: Payer: Managed Care, Other (non HMO) | Admitting: Family Medicine

## 2013-09-16 ENCOUNTER — Ambulatory Visit (INDEPENDENT_AMBULATORY_CARE_PROVIDER_SITE_OTHER): Payer: BC Managed Care – PPO | Admitting: Family Medicine

## 2013-09-16 ENCOUNTER — Encounter: Payer: Self-pay | Admitting: Family Medicine

## 2013-09-16 VITALS — BP 102/67 | HR 63 | Temp 99.2°F | Ht 71.0 in | Wt 220.0 lb

## 2013-09-16 DIAGNOSIS — E785 Hyperlipidemia, unspecified: Secondary | ICD-10-CM

## 2013-09-16 DIAGNOSIS — I10 Essential (primary) hypertension: Secondary | ICD-10-CM | POA: Insufficient documentation

## 2013-09-16 DIAGNOSIS — N400 Enlarged prostate without lower urinary tract symptoms: Secondary | ICD-10-CM

## 2013-09-16 DIAGNOSIS — R710 Precipitous drop in hematocrit: Secondary | ICD-10-CM

## 2013-09-16 DIAGNOSIS — F319 Bipolar disorder, unspecified: Secondary | ICD-10-CM

## 2013-09-16 DIAGNOSIS — K219 Gastro-esophageal reflux disease without esophagitis: Secondary | ICD-10-CM

## 2013-09-16 DIAGNOSIS — D649 Anemia, unspecified: Secondary | ICD-10-CM

## 2013-09-16 MED ORDER — ROSUVASTATIN CALCIUM 10 MG PO TABS: 10.0000 mg | ORAL_TABLET | Freq: Every day | ORAL | Status: DC

## 2013-09-16 MED ORDER — ANTARA 30 MG PO CAPS: 30.0000 mg | ORAL_CAPSULE | Freq: Every day | ORAL | Status: DC

## 2013-09-16 MED ORDER — LITHIUM CARBONATE 300 MG PO TABS: ORAL_TABLET | ORAL | Status: DC

## 2013-09-16 NOTE — Patient Instructions (Addendum)
Continue current medications. Continue good therapeutic lifestyle changes which include good diet and exercise. Fall precautions discussed with patient. If an FOBT was given today- please return it to our front desk. If you are over 53 years old - you may need Prevnar 13 or the adult Pneumonia vaccine.  Try to drink more water and eat less bread. This will help the triglycerides. Continue regular exercise. Because the hemoglobin is slightly decreased, we will ask you to return in another FOBT. We will plan to repeat the CBC at your next visit.

## 2013-09-16 NOTE — Progress Notes (Signed)
Subjective:    Patient ID: Melvin Roberts, male    DOB: [birth year], 53 y.o.   MRN: [id number]  HPI Pt here for follow up and management of chronic medical problems. The patient comes to the visit today with no complaints. He says he feels very good. He is taking lisinopril 10 mg daily. His blood pressure is at the low end of the range today. A repeat blood pressure by me in the office was 108/72. He indicates that his blood pressures at work he been running in the 120/80 range. His lab work was reviewed with him at the visit today.         Patient Active Problem List   Diagnosis Date Noted  . Bipolar disorder 03/11/2013  . Allergic rhinitis 03/11/2013  . BPH (benign prostatic hyperplasia) 03/11/2013  . DYSLIPIDEMIA 05/29/2007  . DEPRESSION, CHRONIC 05/29/2007  . SINUSITIS, RECURRENT 05/29/2007  . ESOPHAGEAL STRICTURE 05/29/2007  . GERD 05/29/2007  . HIATAL HERNIA 05/29/2007  . LOW BACK PAIN, CHRONIC 05/29/2007   Outpatient Encounter Prescriptions as of 09/16/2013  Medication Sig  . ANTARA 30 MG CAPS Take 30 mg by mouth daily.  . cholecalciferol (VITAMIN D) 1000 UNITS tablet Take 1,000 Units by mouth daily.  Marland Kitchen lisinopril (PRINIVIL,ZESTRIL) 10 MG tablet Take 1 tablet (10 mg total) by mouth daily.  Marland Kitchen lithium 300 MG tablet TAKE 1 TABLET 3 TIMES A DAY  . omeprazole (PRILOSEC) 20 MG capsule Take 20 mg by mouth daily.  . rosuvastatin (CRESTOR) 10 MG tablet Take 10 mg by mouth daily.  . Zinc 50 MG CAPS Take 1 capsule by mouth daily.  . [DISCONTINUED] meloxicam (MOBIC) 15 MG tablet One half to one daily after eating as directed  . metaxalone (SKELAXIN) 800 MG tablet TAKE 1 TABLET (800 MG TOTAL) BY MOUTH 3 (THREE) TIMES DAILY.  . [DISCONTINUED] fluticasone (FLONASE) 50 MCG/ACT nasal spray 1-2 sprays in each nostril daily at bedtime    Review of Systems  Constitutional: Negative.   HENT: Negative.   Eyes: Negative.   Respiratory: Negative.   Cardiovascular: Negative.     Gastrointestinal: Negative.   Endocrine: Negative.   Genitourinary: Negative.   Musculoskeletal: Negative.   Skin: Negative.   Allergic/Immunologic: Negative.   Neurological: Negative.   Hematological: Negative.   Psychiatric/Behavioral: Negative.        Objective:   Physical Exam  Nursing note and vitals reviewed. Constitutional: He is oriented to person, place, and time. He appears well-developed and well-nourished. No distress.  HENT:  Head: Normocephalic and atraumatic.  Right Ear: External ear normal.  Left Ear: External ear normal.  Nose: Nose normal.  Mouth/Throat: Oropharynx is clear and moist. No oropharyngeal exudate.  Eyes: Conjunctivae and EOM are normal. Pupils are equal, round, and reactive to light. Right eye exhibits no discharge. Left eye exhibits no discharge. No scleral icterus.  Neck: Normal range of motion. Neck supple. No thyromegaly present.  Cardiovascular: Normal rate, regular rhythm, normal heart sounds and intact distal pulses.  Exam reveals no gallop and no friction rub.   No murmur heard. At 72 per minute  Pulmonary/Chest: Effort normal and breath sounds normal. No respiratory distress. He has no wheezes. He has no rales. He exhibits no tenderness.  Abdominal: Soft. Bowel sounds are normal. He exhibits no mass. There is no tenderness. There is no rebound and no guarding.  Musculoskeletal: Normal range of motion. He exhibits no edema and no tenderness.  Lymphadenopathy:    He has no cervical  adenopathy.  Neurological: He is alert and oriented to person, place, and time. He has normal reflexes. No cranial nerve deficit.  Skin: Skin is warm and dry. No rash noted. No pallor.  Psychiatric: He has a normal mood and affect. His behavior is normal. Judgment and thought content normal.   BP 102/67  Pulse 63  Temp(Src) 99.2 F (37.3 C) (Oral)  Ht 5\' 11"  (1.803 m)  Wt 220 lb (99.791 kg)  BMI 30.70 kg/m2        Assessment & Plan:  1. Bipolar  disorder  2. BPH (benign prostatic hyperplasia)  3. DYSLIPIDEMIA  4. GERD  5. Decreased hemoglobin -Repeat FOBT -Avoid NSAIDs  Patient Instructions  Continue current medications. Continue good therapeutic lifestyle changes which include good diet and exercise. Fall precautions discussed with patient. If an FOBT was given today- please return it to our front desk. If you are over 53 years old - you may need Prevnar 13 or the adult Pneumonia vaccine.  Try to drink more water and eat less bread. This will help the triglycerides. Continue regular exercise. Because the hemoglobin is slightly decreased, we will ask you to return in another FOBT. We will plan to repeat the CBC at your next visit.     Nyra Capeson W. Deetra Booton MD

## 2013-09-18 ENCOUNTER — Other Ambulatory Visit: Payer: BC Managed Care – PPO

## 2013-09-18 DIAGNOSIS — Z1212 Encounter for screening for malignant neoplasm of rectum: Secondary | ICD-10-CM

## 2013-09-18 NOTE — Progress Notes (Signed)
Patient dropped off fobt 

## 2013-09-19 LAB — FECAL OCCULT BLOOD, IMMUNOCHEMICAL: Fecal Occult Bld: NEGATIVE

## 2013-10-31 ENCOUNTER — Other Ambulatory Visit: Payer: Self-pay | Admitting: Family Medicine

## 2013-11-18 ENCOUNTER — Other Ambulatory Visit: Payer: Self-pay | Admitting: Family Medicine

## 2013-11-19 ENCOUNTER — Other Ambulatory Visit: Payer: Self-pay | Admitting: Family Medicine

## 2013-12-05 ENCOUNTER — Ambulatory Visit (INDEPENDENT_AMBULATORY_CARE_PROVIDER_SITE_OTHER): Payer: BC Managed Care – PPO | Admitting: Family Medicine

## 2013-12-05 ENCOUNTER — Encounter: Payer: Self-pay | Admitting: *Deleted

## 2013-12-05 ENCOUNTER — Encounter: Payer: Self-pay | Admitting: Family Medicine

## 2013-12-05 ENCOUNTER — Ambulatory Visit: Payer: BC Managed Care – PPO | Admitting: Nurse Practitioner

## 2013-12-05 VITALS — BP 116/71 | HR 59 | Temp 97.4°F | Ht 71.0 in | Wt 224.0 lb

## 2013-12-05 DIAGNOSIS — M545 Low back pain, unspecified: Secondary | ICD-10-CM

## 2013-12-05 DIAGNOSIS — M19019 Primary osteoarthritis, unspecified shoulder: Secondary | ICD-10-CM

## 2013-12-05 MED ORDER — MELOXICAM 15 MG PO TABS: 15.0000 mg | ORAL_TABLET | Freq: Every day | ORAL | Status: DC

## 2013-12-05 NOTE — Patient Instructions (Addendum)
Warm wet compresses to area as needed Take meds as directed Remain out of work through Saturday Avoid heavy lifting pushing straining etc. Avoid birthday parties!

## 2013-12-05 NOTE — Progress Notes (Signed)
Subjective:    Patient ID: Melvin Roberts, male    DOB: 12/23/1960, 53 y.o.   MRN: [id number]  HPI Patient here today for left shoulder and back pain that is an on-going issue. The patient indicates that Melvin Roberts shoulder pain and low back pain were aggravated about 2 weeks ago by having to move furniture into Melvin Roberts daughter's time. This coupled with extra work duties are in Melvin Roberts opinion the reason for Melvin Roberts increased pain and discomfort. It is of note that he had left shoulder films in the spring which were normal.      Patient Active Problem List   Diagnosis Date Noted  . Hypertension 09/16/2013  . Bipolar disorder 03/11/2013  . Allergic rhinitis 03/11/2013  . BPH (benign prostatic hyperplasia) 03/11/2013  . DYSLIPIDEMIA 05/29/2007  . DEPRESSION, CHRONIC 05/29/2007  . SINUSITIS, RECURRENT 05/29/2007  . ESOPHAGEAL STRICTURE 05/29/2007  . GERD 05/29/2007  . HIATAL HERNIA 05/29/2007  . LOW BACK PAIN, CHRONIC 05/29/2007   Outpatient Encounter Prescriptions as of 12/05/2013  Medication Sig  . ANTARA 30 MG CAPS Take 30 mg by mouth daily.  . cholecalciferol (VITAMIN D) 1000 UNITS tablet Take 1,000 Units by mouth daily.  Marland Kitchen. lisinopril (PRINIVIL,ZESTRIL) 10 MG tablet TAKE 1 TABLET (10 MG TOTAL) BY MOUTH DAILY.  Marland Kitchen. lithium 300 MG tablet TAKE 1 TABLET 3 TIMES A DAY  . metaxalone (SKELAXIN) 800 MG tablet TAKE 1 TABLET (800 MG TOTAL) BY MOUTH 3 (THREE) TIMES DAILY.  Marland Kitchen. omeprazole (PRILOSEC) 20 MG capsule Take 20 mg by mouth daily.  . rosuvastatin (CRESTOR) 10 MG tablet Take 1 tablet (10 mg total) by mouth daily.  . Zinc 50 MG CAPS Take 1 capsule by mouth daily.    Review of Systems  Constitutional: Negative.   HENT: Negative.   Eyes: Negative.   Respiratory: Negative.   Cardiovascular: Negative.   Gastrointestinal: Negative.   Endocrine: Negative.   Genitourinary: Negative.   Musculoskeletal: Positive for arthralgias (left shoulder) and back pain.  Skin: Negative.   Allergic/Immunologic:  Negative.   Neurological: Negative.   Hematological: Negative.   Psychiatric/Behavioral: Negative.        Objective:   Physical Exam  Nursing note and vitals reviewed. Constitutional: He is oriented to person, place, and time. He appears well-developed and well-nourished. No distress.  HENT:  Head: Normocephalic and atraumatic.  Eyes: Conjunctivae and EOM are normal. Pupils are equal, round, and reactive to light. Right eye exhibits no discharge. Left eye exhibits no discharge. No scleral icterus.  Neck: Normal range of motion. Neck supple.  Musculoskeletal: He exhibits tenderness. He exhibits no edema.  There is limited range of motion and abduction of the left shoulder. There was specific joint tenderness at the left a.c. joint. There was minimal deltoid tendon tenderness and suprascapular tenderness. There was some slight discomfort in the paravertebral muscles of the lumbar area.  Neurological: He is alert and oriented to person, place, and time.  Skin: Skin is warm and dry. No rash noted.  Psychiatric: He has a normal mood and affect. Melvin Roberts behavior is normal. Judgment and thought content normal.   BP 116/71  Pulse 59  Temp(Src) 97.4 F (36.3 C) (Oral)  Ht 5\' 11"  (1.803 m)  Wt 224 lb (101.606 kg)  BMI 31.26 kg/m2        Assessment & Plan:  1. Acromioclavicular joint arthritis  2. Bilateral low back pain without sciatica  Meds ordered this encounter  Medications  . meloxicam (MOBIC) 15 MG tablet  Sig: Take 1 tablet (15 mg total) by mouth daily.    Dispense:  30 tablet    Refill:  1   Patient Instructions  Warm wet compresses to area as needed Take meds as directed Remain out of work through Saturday Avoid heavy lifting pushing straining etc. Avoid birthday parties!   Nyra Capes MD

## 2014-02-19 ENCOUNTER — Ambulatory Visit (INDEPENDENT_AMBULATORY_CARE_PROVIDER_SITE_OTHER): Payer: BC Managed Care – PPO | Admitting: Sports Medicine

## 2014-02-19 ENCOUNTER — Encounter: Payer: Self-pay | Admitting: Sports Medicine

## 2014-02-19 VITALS — BP 117/76 | HR 65 | Ht 71.0 in | Wt 220.0 lb

## 2014-02-19 DIAGNOSIS — M2142 Flat foot [pes planus] (acquired), left foot: Secondary | ICD-10-CM | POA: Diagnosis not present

## 2014-02-19 DIAGNOSIS — M216X9 Other acquired deformities of unspecified foot: Secondary | ICD-10-CM | POA: Diagnosis not present

## 2014-02-19 DIAGNOSIS — M2141 Flat foot [pes planus] (acquired), right foot: Secondary | ICD-10-CM | POA: Diagnosis not present

## 2014-02-20 NOTE — Progress Notes (Signed)
   Subjective:    Patient ID: Melvin Roberts, male    DOB: 09/08/1960, 53 y.o.   MRN: [id number]  HPI chief complaint: Bilateral feet pain  Patient is a very pleasant 53 year old male with comes in today for orthotics. 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 He has tried off-the-shelf orthotics with some symptom relief but would like to try custom orthotics. His pain is diffuse throughout the foot. Worse at the end of the day. No swelling. No trauma. No prior foot or ankle surgeries. No numbness or tingling. Past medical history reviewed Medications reviewed Allergies reviewed Social history reviewed    Review of Systems     Objective:   Physical Exam Well-developed, well-nourished. No acute distress. Awake alert and oriented 3. Vital signs reviewed.  Examination of each foot in the standing position shows mild pes planus. Patient does pronate with walking. He has full ankle range of motion. Full subtalar motion. No soft tissue swelling. No tenderness to palpation. No leg length discrepancy. Neurovascularly intact distally. Walking without a limp.       Assessment & Plan:  Pes planus/pronation  Custom orthotics were made today. 45 minutes was spent with the patient with greater than 50% of the time in face-to-face consultation discussing orthotic instruction, gait analysis, orthotic construction, and orthotic fitting. Patient found orthotics comfortable prior to leaving the office. Follow-up when necessary.  Patient was fitted for a : standard, cushioned, semi-rigid orthotic. The orthotic was heated and afterward the patient stood on the orthotic blank positioned on the orthotic stand. The patient was positioned in subtalar neutral position and 10 degrees of ankle dorsiflexion in a weight bearing stance. After completion of molding, a stable base was applied to the orthotic blank. The blank was ground to a stable position for weight  bearing. Size:12 Base: blue EVA Posting: none Additional orthotic padding: none

## 2014-02-24 ENCOUNTER — Other Ambulatory Visit: Payer: Self-pay | Admitting: Family Medicine

## 2014-03-19 ENCOUNTER — Other Ambulatory Visit: Payer: Self-pay | Admitting: Family Medicine

## 2014-03-24 ENCOUNTER — Encounter: Payer: Self-pay | Admitting: Family Medicine

## 2014-03-24 ENCOUNTER — Ambulatory Visit (INDEPENDENT_AMBULATORY_CARE_PROVIDER_SITE_OTHER): Payer: BC Managed Care – PPO | Admitting: Family Medicine

## 2014-03-24 VITALS — BP 117/75 | HR 68 | Temp 97.2°F | Ht 71.0 in | Wt 225.6 lb

## 2014-03-24 DIAGNOSIS — Z0000 Encounter for general adult medical examination without abnormal findings: Secondary | ICD-10-CM

## 2014-03-24 DIAGNOSIS — R5383 Other fatigue: Secondary | ICD-10-CM

## 2014-03-24 DIAGNOSIS — Z79899 Other long term (current) drug therapy: Secondary | ICD-10-CM

## 2014-03-24 DIAGNOSIS — I10 Essential (primary) hypertension: Secondary | ICD-10-CM

## 2014-03-24 DIAGNOSIS — Z23 Encounter for immunization: Secondary | ICD-10-CM

## 2014-03-24 DIAGNOSIS — E785 Hyperlipidemia, unspecified: Secondary | ICD-10-CM

## 2014-03-24 DIAGNOSIS — N400 Enlarged prostate without lower urinary tract symptoms: Secondary | ICD-10-CM

## 2014-03-24 LAB — POCT CBC
GRANULOCYTE PERCENT: 75.9 % (ref 37–80)
HCT, POC: 39 % — AB (ref 43.5–53.7)
HEMOGLOBIN: 12.7 g/dL — AB (ref 14.1–18.1)
Lymph, poc: 2 (ref 0.6–3.4)
MCH: 28.8 pg (ref 27–31.2)
MCHC: 32.5 g/dL (ref 31.8–35.4)
MCV: 88.4 fL (ref 80–97)
MPV: 7 fL (ref 0–99.8)
POC Granulocyte: 6.8 (ref 2–6.9)
POC LYMPH PERCENT: 22 %L (ref 10–50)
Platelet Count, POC: 294 10*3/uL (ref 142–424)
RBC: 4.4 M/uL — AB (ref 4.69–6.13)
RDW, POC: 13.9 %
WBC: 8.9 10*3/uL (ref 4.6–10.2)

## 2014-03-24 NOTE — Patient Instructions (Addendum)
Continue current medications. Continue good therapeutic lifestyle changes which include good diet and exercise. Fall precautions discussed with patient. If an FOBT was given today- please return it to our front desk. If you are over 53 years old - you may need Prevnar 13 or the adult Pneumonia vaccine.  Flu Shots will be available at our office starting mid- September. Please call and schedule a FLU CLINIC APPOINTMENT.   Please make an appointment with orthopedic surgeon to look at your shoulder in January Continue to work on your weight and watch your sodium intake Drink plenty of water Take meloxicam as needed

## 2014-03-24 NOTE — Progress Notes (Signed)
Subjective:    Patient ID: Melvin Roberts, male    DOB: [birth year], 53 y.o.   MRN: [id number]  HPI Pt is here today for follow up of chronic medical problems which include hypertension, hyperlipidemia, BPH, GERD and bipolar disorder. The patient is also here for his yearly exam. He will need a rectal exam today and a PSA. He will get his routine lab work. He does complain of fatigue and left shoulder pain. He is now working day shift. He is up-to-date on his FOBT. He will get lab work today.           Review of Systems  Constitutional: Positive for fatigue.  HENT: Negative for congestion and ear pain.   Eyes: Negative.   Respiratory: Negative for cough and shortness of breath.   Gastrointestinal: Negative.   Endocrine: Negative.   Genitourinary: Negative for dysuria and frequency.  Musculoskeletal: Positive for arthralgias (L shoulder pain).  Skin: Negative.   Neurological: Negative for dizziness and headaches.  Psychiatric/Behavioral: Negative.    Patient Active Problem List   Diagnosis Date Noted  . Hypertension 09/16/2013  . Bipolar disorder 03/11/2013  . Allergic rhinitis 03/11/2013  . BPH (benign prostatic hyperplasia) 03/11/2013  . DYSLIPIDEMIA 05/29/2007  . DEPRESSION, CHRONIC 05/29/2007  . SINUSITIS, RECURRENT 05/29/2007  . ESOPHAGEAL STRICTURE 05/29/2007  . GERD 05/29/2007  . HIATAL HERNIA 05/29/2007  . LOW BACK PAIN, CHRONIC 05/29/2007   Outpatient Encounter Prescriptions as of 03/24/2014  Medication Sig  . ANTARA 30 MG CAPS Take 30 mg by mouth daily.  . cholecalciferol (VITAMIN D) 1000 UNITS tablet Take 1,000 Units by mouth daily.  Marland Kitchen lisinopril (PRINIVIL,ZESTRIL) 10 MG tablet TAKE 1 TABLET (10 MG TOTAL) BY MOUTH DAILY.  Marland Kitchen lithium 300 MG tablet TAKE 1 TABLET 3 TIMES A DAY  . meloxicam (MOBIC) 15 MG tablet Take 1 tablet (15 mg total) by mouth daily.  . metaxalone (SKELAXIN) 800 MG tablet TAKE 1 TABLET (800 MG TOTAL) BY MOUTH 3 (THREE) TIMES DAILY.  Marland Kitchen  omeprazole (PRILOSEC) 20 MG capsule Take 20 mg by mouth daily.  . [DISCONTINUED] rosuvastatin (CRESTOR) 10 MG tablet Take 1 tablet (10 mg total) by mouth daily. (Patient not taking: Reported on 03/24/2014)  . [DISCONTINUED] Zinc 50 MG CAPS Take 1 capsule by mouth daily.      Objective:   Physical Exam  Constitutional: He is oriented to person, place, and time. He appears well-developed and well-nourished. No distress.  Patient is pleasant alert and cooperative  HENT:  Head: Normocephalic and atraumatic.  Right Ear: External ear normal.  Left Ear: External ear normal.  Mouth/Throat: Oropharynx is clear and moist. No oropharyngeal exudate.  Some nasal congestion bilaterally  Eyes: Conjunctivae and EOM are normal. Pupils are equal, round, and reactive to light. Right eye exhibits no discharge. Left eye exhibits no discharge. No scleral icterus.  Neck: Normal range of motion. Neck supple. No thyromegaly present.  No carotid bruits or adenopathy  Cardiovascular: Normal rate, regular rhythm and intact distal pulses.   No murmur heard. At 72/m  Pulmonary/Chest: Effort normal and breath sounds normal. No respiratory distress. He has no wheezes. He has no rales. He exhibits no tenderness.  Axillary is negative for adenopathy  Abdominal: Soft. Bowel sounds are normal. He exhibits no mass. There is no tenderness. There is no rebound and no guarding.  No abdominal tenderness or inguinal adenopathy  Genitourinary: Rectum normal and penis normal. No penile tenderness.  The prostate is slightly enlarged but smooth  right greater than left no rectal masses. There were no inguinal hernias. External genitalia were within normal limits.  Musculoskeletal: Normal range of motion. He exhibits no edema or tenderness.  Left Shoulder discomfort with movements in certain positions.  Lymphadenopathy:    He has no cervical adenopathy.  Neurological: He is alert and oriented to person, place, and time. He has normal  reflexes. No cranial nerve deficit.  Skin: Skin is warm and dry. No rash noted. No erythema. No pallor.  Psychiatric: He has a normal mood and affect. His behavior is normal. Judgment and thought content normal.  Mood appears upbeat and positive  Nursing note and vitals reviewed.   BP 117/75 mmHg  Pulse 68  Temp(Src) 97.2 F (36.2 C) (Oral)  Ht 5' 11" (1.803 m)  Wt 225 lb 9.6 oz (102.331 kg)  BMI 31.48 kg/m2       Assessment & Plan:  1. Hyperlipemia - BMP8+EGFR - Hepatic function panel - NMR, lipoprofile  2. Other fatigue - POCT CBC - Vit D  25 hydroxy (rtn osteoporosis monitoring) - Thyroid Panel With TSH  3. Essential hypertension, benign  4. BPH (benign prostatic hypertrophy) - POCT CBC - PSA, total and free  5. High risk medication use - Lithium level  6. Encounter for immunization  7. Annual physical exam  No orders of the defined types were placed in this encounter.   Patient Instructions  Continue current medications. Continue good therapeutic lifestyle changes which include good diet and exercise. Fall precautions discussed with patient. If an FOBT was given today- please return it to our front desk. If you are over 50 years old - you may need Prevnar 13 or the adult Pneumonia vaccine.  Flu Shots will be available at our office starting mid- September. Please call and schedule a FLU CLINIC APPOINTMENT.   Please make an appointment with orthopedic surgeon to look at your shoulder in January Continue to work on your weight and watch your sodium intake Drink plenty of water Take meloxicam as needed   Don W. Moore MD   

## 2014-03-25 ENCOUNTER — Telehealth: Payer: Self-pay | Admitting: *Deleted

## 2014-03-25 LAB — HEPATIC FUNCTION PANEL
ALBUMIN: 4.5 g/dL (ref 3.5–5.5)
ALT: 41 IU/L (ref 0–44)
AST: 38 IU/L (ref 0–40)
Alkaline Phosphatase: 56 IU/L (ref 39–117)
BILIRUBIN DIRECT: 0.09 mg/dL (ref 0.00–0.40)
TOTAL PROTEIN: 7 g/dL (ref 6.0–8.5)
Total Bilirubin: 0.4 mg/dL (ref 0.0–1.2)

## 2014-03-25 LAB — BMP8+EGFR
BUN/Creatinine Ratio: 12 (ref 9–20)
BUN: 13 mg/dL (ref 6–24)
CALCIUM: 9.8 mg/dL (ref 8.7–10.2)
CO2: 23 mmol/L (ref 18–29)
CREATININE: 1.11 mg/dL (ref 0.76–1.27)
Chloride: 103 mmol/L (ref 97–108)
GFR calc non Af Amer: 75 mL/min/{1.73_m2} (ref >59)
GFR, EST AFRICAN AMERICAN: 87 mL/min/{1.73_m2} (ref >59)
GLUCOSE: 90 mg/dL (ref 65–99)
Potassium: 4.8 mmol/L (ref 3.5–5.2)
Sodium: 139 mmol/L (ref 134–144)

## 2014-03-25 LAB — THYROID PANEL WITH TSH
FREE THYROXINE INDEX: 1.8 (ref 1.2–4.9)
T3 UPTAKE RATIO: 25 % (ref 24–39)
T4 TOTAL: 7 ug/dL (ref 4.5–12.0)
TSH: 0.992 u[IU]/mL (ref 0.450–4.500)

## 2014-03-25 LAB — NMR, LIPOPROFILE
CHOLESTEROL: 186 mg/dL (ref 100–199)
HDL CHOLESTEROL BY NMR: 38 mg/dL — AB (ref >39)
HDL Particle Number: 28 umol/L — ABNORMAL LOW (ref >=30.5)
LDL PARTICLE NUMBER: 1422 nmol/L — AB (ref <1000)
LDL Size: 20.1 nm (ref >20.5)
LDL-C: 108 mg/dL — AB (ref 0–99)
LP-IR Score: 82 — ABNORMAL HIGH (ref <=45)
Small LDL Particle Number: 935 nmol/L — ABNORMAL HIGH (ref <=527)
TRIGLYCERIDES BY NMR: 200 mg/dL — AB (ref 0–149)

## 2014-03-25 LAB — PSA, TOTAL AND FREE
PSA, Free Pct: 19.2 %
PSA, Free: 0.46 ng/mL
PSA: 2.4 ng/mL (ref 0.0–4.0)

## 2014-03-25 LAB — VITAMIN D 25 HYDROXY (VIT D DEFICIENCY, FRACTURES): Vit D, 25-Hydroxy: 41.4 ng/mL (ref 30.0–100.0)

## 2014-03-25 LAB — LITHIUM LEVEL: LITHIUM LVL: 0.8 mmol/L (ref 0.6–1.4)

## 2014-03-25 MED ORDER — PRAVASTATIN SODIUM 40 MG PO TABS: 40.0000 mg | ORAL_TABLET | Freq: Every day | ORAL | Status: DC

## 2014-03-25 NOTE — Addendum Note (Signed)
Addended by: Almeta MonasSTONE, Luverna Degenhart M on: 03/25/2014 04:51 PM   Modules accepted: Orders

## 2014-03-25 NOTE — Addendum Note (Signed)
Addended by: Tommas OlpHANDY, ASHLEY N on: 03/25/2014 05:03 PM   Modules accepted: Orders

## 2014-03-25 NOTE — Telephone Encounter (Signed)
Please call.   Prostate number is going up a little each year but still in the normal range. Cholesterol is still up some .  You can start a fairly cheap medication to help with this.  Pravastatin  40 mg  will be called in if you are willing to start it.     Would like you to come back in a month for a repeat PSA,  Urine.    Should do a  Liver  Test if you begin the cholesterol medications.(  These orders can be put in for just a lab visit.)

## 2014-05-23 ENCOUNTER — Encounter: Payer: Self-pay | Admitting: Family Medicine

## 2014-05-23 ENCOUNTER — Ambulatory Visit (INDEPENDENT_AMBULATORY_CARE_PROVIDER_SITE_OTHER): Payer: BLUE CROSS/BLUE SHIELD | Admitting: Family Medicine

## 2014-05-23 VITALS — BP 119/79 | HR 58 | Temp 97.0°F | Ht 71.0 in | Wt 231.4 lb

## 2014-05-23 DIAGNOSIS — J0100 Acute maxillary sinusitis, unspecified: Secondary | ICD-10-CM

## 2014-05-23 MED ORDER — METHYLPREDNISOLONE (PAK) 4 MG PO TABS: ORAL_TABLET | ORAL | Status: DC

## 2014-05-23 MED ORDER — AMOXICILLIN-POT CLAVULANATE 875-125 MG PO TABS
1.0000 | ORAL_TABLET | Freq: Two times a day (BID) | ORAL | Status: DC
Start: 2014-05-23 — End: 2014-09-29

## 2014-05-23 NOTE — Progress Notes (Signed)
   Subjective:    Patient ID: Melvin Roberts, male    DOB: 12/25/1960, 54 y.o.   MRN: [id number]  HPI C/o uri sx's and facial pain.  He has hx of chronic sinusitis.  He is seeing ENT.  Review of Systems  Constitutional: Negative for fever.  HENT: Negative for ear pain.   Eyes: Negative for discharge.  Respiratory: Negative for cough.   Cardiovascular: Negative for chest pain.  Gastrointestinal: Negative for abdominal distention.  Endocrine: Negative for polyuria.  Genitourinary: Negative for difficulty urinating.  Musculoskeletal: Negative for gait problem and neck pain.  Skin: Negative for color change and rash.  Neurological: Negative for speech difficulty and headaches.  Psychiatric/Behavioral: Negative for agitation.       Objective:    BP 119/79 mmHg  Pulse 58  Temp(Src) 97 F (36.1 C) (Oral)  Ht 5\' 11"  (1.803 m)  Wt 231 lb 6.4 oz (104.962 kg)  BMI 32.29 kg/m2 Physical Exam  Constitutional: He is oriented to person, place, and time. He appears well-developed and well-nourished.  HENT:  Head: Normocephalic and atraumatic.  Mouth/Throat: Oropharynx is clear and moist.  Eyes: Pupils are equal, round, and reactive to light.  Neck: Normal range of motion. Neck supple.  Cardiovascular: Normal rate and regular rhythm.   No murmur heard. Pulmonary/Chest: Effort normal and breath sounds normal.  Abdominal: Soft. Bowel sounds are normal. There is no tenderness.  Neurological: He is alert and oriented to person, place, and time.  Skin: Skin is warm and dry.  Psychiatric: He has a normal mood and affect.          Assessment & Plan:     ICD-9-CM ICD-10-CM   1. Subacute maxillary sinusitis 461.0 J01.00 amoxicillin-clavulanate (AUGMENTIN) 875-125 MG per tablet     methylPREDNIsolone (MEDROL DOSPACK) 4 MG tablet   Push po fluids, rest, tylenol and motrin otc prn as directed for fever, arthralgias, and myalgias.  Follow up prn if sx's continue or persist.  No  Follow-up on file.  Deatra CanterWilliam J Harlo Jaso FNP

## 2014-05-28 ENCOUNTER — Other Ambulatory Visit: Payer: Self-pay | Admitting: Family Medicine

## 2014-06-24 ENCOUNTER — Other Ambulatory Visit: Payer: Self-pay | Admitting: Family Medicine

## 2014-09-08 ENCOUNTER — Other Ambulatory Visit (INDEPENDENT_AMBULATORY_CARE_PROVIDER_SITE_OTHER): Payer: BLUE CROSS/BLUE SHIELD

## 2014-09-08 DIAGNOSIS — I10 Essential (primary) hypertension: Secondary | ICD-10-CM

## 2014-09-08 DIAGNOSIS — N400 Enlarged prostate without lower urinary tract symptoms: Secondary | ICD-10-CM | POA: Diagnosis not present

## 2014-09-08 LAB — POCT UA - MICROSCOPIC ONLY
Bacteria, U Microscopic: NEGATIVE
CASTS, UR, LPF, POC: NEGATIVE
Crystals, Ur, HPF, POC: NEGATIVE
Mucus, UA: NEGATIVE
RBC, URINE, MICROSCOPIC: NEGATIVE
Yeast, UA: NEGATIVE

## 2014-09-08 LAB — POCT URINALYSIS DIPSTICK
BILIRUBIN UA: NEGATIVE
Glucose, UA: NEGATIVE
KETONES UA: NEGATIVE
LEUKOCYTES UA: NEGATIVE
NITRITE UA: NEGATIVE
PH UA: 6.5
Protein, UA: NEGATIVE
RBC UA: NEGATIVE
Spec Grav, UA: 1.015
UROBILINOGEN UA: NEGATIVE

## 2014-09-08 NOTE — Progress Notes (Signed)
Lab only 

## 2014-09-09 LAB — HEPATIC FUNCTION PANEL
ALBUMIN: 4.6 g/dL (ref 3.5–5.5)
ALK PHOS: 68 IU/L (ref 39–117)
ALT: 43 IU/L (ref 0–44)
AST: 32 IU/L (ref 0–40)
Bilirubin Total: 0.2 mg/dL (ref 0.0–1.2)
Bilirubin, Direct: 0.07 mg/dL (ref 0.00–0.40)
Total Protein: 6.8 g/dL (ref 6.0–8.5)

## 2014-09-09 LAB — PSA, TOTAL AND FREE
PSA, Free Pct: 21.8 %
PSA, Free: 0.37 ng/mL
Prostate Specific Ag, Serum: 1.7 ng/mL (ref 0.0–4.0)

## 2014-09-14 ENCOUNTER — Other Ambulatory Visit: Payer: Self-pay | Admitting: Family Medicine

## 2014-09-15 ENCOUNTER — Other Ambulatory Visit: Payer: Self-pay | Admitting: Family Medicine

## 2014-09-22 ENCOUNTER — Other Ambulatory Visit: Payer: Self-pay | Admitting: Family Medicine

## 2014-09-26 ENCOUNTER — Other Ambulatory Visit: Payer: Self-pay | Admitting: Family Medicine

## 2014-09-29 ENCOUNTER — Ambulatory Visit (INDEPENDENT_AMBULATORY_CARE_PROVIDER_SITE_OTHER): Payer: BLUE CROSS/BLUE SHIELD | Admitting: Family Medicine

## 2014-09-29 ENCOUNTER — Encounter: Payer: Self-pay | Admitting: Family Medicine

## 2014-09-29 VITALS — BP 109/74 | HR 62 | Temp 97.5°F | Ht 71.0 in | Wt 228.0 lb

## 2014-09-29 DIAGNOSIS — N400 Enlarged prostate without lower urinary tract symptoms: Secondary | ICD-10-CM | POA: Diagnosis not present

## 2014-09-29 DIAGNOSIS — J301 Allergic rhinitis due to pollen: Secondary | ICD-10-CM | POA: Diagnosis not present

## 2014-09-29 DIAGNOSIS — K219 Gastro-esophageal reflux disease without esophagitis: Secondary | ICD-10-CM

## 2014-09-29 DIAGNOSIS — E785 Hyperlipidemia, unspecified: Secondary | ICD-10-CM

## 2014-09-29 DIAGNOSIS — I10 Essential (primary) hypertension: Secondary | ICD-10-CM

## 2014-09-29 DIAGNOSIS — F3161 Bipolar disorder, current episode mixed, mild: Secondary | ICD-10-CM | POA: Diagnosis not present

## 2014-09-29 MED ORDER — LISINOPRIL 10 MG PO TABS: ORAL_TABLET | ORAL | Status: DC

## 2014-09-29 MED ORDER — FLUTICASONE PROPIONATE 50 MCG/ACT NA SUSP: NASAL | Status: DC

## 2014-09-29 NOTE — Patient Instructions (Addendum)
Continue current medications. Continue good therapeutic lifestyle changes which include good diet and exercise. Fall precautions discussed with patient. If an FOBT was given today- please return it to our front desk. If you are over 54 years old - you may need Prevnar 13 or the adult Pneumonia vaccine.  Flu Shots are still available at our office. If you still haven't had one please call to set up a nurse visit to get one.   After your visit with [place] today you will receive a survey in the mail or online from [employer] regarding your care with [place]. Please take a moment to fill this out. Your feedback is very important to [place] as you can help [place] better understand your patient needs as well as improve your experience and satisfaction. WE CARE ABOUT YOU!!!   The patient should take either Allegra or Claritin once daily as an antihistamine He should use nasal Flonase 1 or 2 sprays each nostril once daily and this prescription will be called in for him. He should avoid irritating environments as much as possible

## 2014-09-29 NOTE — Addendum Note (Signed)
Addended by: Magdalene River on: 09/29/2014 09:53 AM   Modules accepted: Orders

## 2014-09-29 NOTE — Progress Notes (Signed)
Subjective:    Patient ID: Melvin Roberts, male    DOB: 1960-06-17, 54 y.o.   MRN: [id number]  HPI Pt here for follow up and management of chronic medical problems which includes hypertension and hyperlipidemia. He is taking medications regularly. The patient comes in today and is feeling well other than continued sinus problems and congestion and allergy. He is recently been on Augmentin and prednisone. He is taking an antihistamine. He is not using a nasal steroid. He denies chest pain shortness of breath trouble swallowing blood in the stool or black tarry bowel movements. He is passing his water without problems. He is doing well with his bipolar disorder. His recent lab work was reviewed with him and he is missing having a BMP and a traditional lipid panel and return to the office for this all of the other lab work including a urinalysis was good.      Patient Active Problem List   Diagnosis Date Noted  . Hypertension 09/16/2013  . Bipolar disorder 03/11/2013  . Allergic rhinitis 03/11/2013  . BPH (benign prostatic hyperplasia) 03/11/2013  . DYSLIPIDEMIA 05/29/2007  . DEPRESSION, CHRONIC 05/29/2007  . SINUSITIS, RECURRENT 05/29/2007  . ESOPHAGEAL STRICTURE 05/29/2007  . GERD 05/29/2007  . HIATAL HERNIA 05/29/2007  . LOW BACK PAIN, CHRONIC 05/29/2007   Outpatient Encounter Prescriptions as of 09/29/2014  Medication Sig  . cholecalciferol (VITAMIN D) 1000 UNITS tablet Take 1,000 Units by mouth daily.  Marland Kitchen lisinopril (PRINIVIL,ZESTRIL) 10 MG tablet TAKE 1 TABLET (10 MG TOTAL) BY MOUTH DAILY.  Marland Kitchen lithium 300 MG tablet TAKE 1 TABLET 3 TIMES A DAY  . metaxalone (SKELAXIN) 800 MG tablet TAKE 1 TABLET (800 MG TOTAL) BY MOUTH 3 (THREE) TIMES DAILY.  Marland Kitchen omeprazole (PRILOSEC) 20 MG capsule Take 20 mg by mouth daily.  . [DISCONTINUED] amoxicillin-clavulanate (AUGMENTIN) 875-125 MG per tablet Take 1 tablet by mouth 2 (two) times daily.  . [DISCONTINUED] ANTARA 30 MG CAPS Take 30 mg by mouth  daily.  . [DISCONTINUED] meloxicam (MOBIC) 15 MG tablet Take 1 tablet (15 mg total) by mouth daily.  . [DISCONTINUED] methylPREDNIsolone (MEDROL DOSPACK) 4 MG tablet follow package directions  . [DISCONTINUED] pravastatin (PRAVACHOL) 40 MG tablet Take 1 tablet (40 mg total) by mouth daily.   No facility-administered encounter medications on file as of 09/29/2014.      Review of Systems  Constitutional: Negative.   HENT: Positive for sinus pressure (frequently occuring).   Eyes: Negative.   Respiratory: Negative.   Cardiovascular: Negative.   Gastrointestinal: Negative.   Endocrine: Negative.   Genitourinary: Negative.   Musculoskeletal: Negative.   Skin: Negative.   Allergic/Immunologic: Negative.   Neurological: Negative.   Hematological: Negative.   Psychiatric/Behavioral: Negative.        Objective:   Physical Exam  Constitutional: He is oriented to person, place, and time. He appears well-developed and well-nourished. No distress.  HENT:  Head: Normocephalic and atraumatic.  Right Ear: External ear normal.  Left Ear: External ear normal.  Mouth/Throat: Oropharynx is clear and moist. No oropharyngeal exudate.  There is some nasal congestion left greater than right  Eyes: Conjunctivae and EOM are normal. Pupils are equal, round, and reactive to light. Right eye exhibits no discharge. Left eye exhibits no discharge. No scleral icterus.  His last eye exam was in the past year.  Neck: Normal range of motion. Neck supple. No tracheal deviation present. No thyromegaly present.  There are no anterior or posterior cervical nodes. There is no thyromegaly.  Cardiovascular: Normal rate, regular rhythm, normal heart sounds and intact distal pulses.  Exam reveals no gallop and no friction rub.   No murmur heard. The heart had a regular rate and rhythm without murmurs at 72/m  Pulmonary/Chest: Effort normal and breath sounds normal. No respiratory distress. He has no wheezes. He has no  rales. He exhibits no tenderness.  Lungs were clear anteriorly and posteriorly  Abdominal: Soft. Bowel sounds are normal. He exhibits no mass. There is no tenderness. There is no rebound and no guarding.  Normal bowel sounds without bruits  Musculoskeletal: Normal range of motion. He exhibits no edema or tenderness.  Lymphadenopathy:    He has no cervical adenopathy.  Neurological: He is alert and oriented to person, place, and time. He has normal reflexes. No cranial nerve deficit.  Skin: Skin is warm and dry. No rash noted. No erythema. No pallor.  Psychiatric: He has a normal mood and affect. His behavior is normal. Judgment and thought content normal.  Nursing note and vitals reviewed.  BP 109/74 mmHg  Pulse 62  Temp(Src) 97.5 F (36.4 C) (Oral)  Ht  (1.803 m)  Wt 228 lb (103.42 kg)  BMI 31.81 kg/m2        Assessment & Plan:  1. Hyperlipemia -Patient is currently just following his diet to control his cholesterol and whether we add medication will be dependent upon the lab work that we get when he returns.  2. Essential hypertension, benign -The patient should continue with his current blood pressure medication and the blood pressure readings today were good.  3. BPH (benign prostatic hypertrophy) -He is having no problems with voiding and his PSA was recently checked and is back to a more normal level and the rate of rise is within normal limits.  4. Gastroesophageal reflux disease, esophagitis presence not specified -As long as he takes his omeprazole his GERD is well controlled  5. Bipolar disorder, current episode mixed, mild -He is doing well with his bipolar disorder he will continue with his lithium  6. Allergic rhinitis due to pollen -We will add Flonase along with taking Claritin or Allegra for his allergic rhinitis - fluticasone (FLONASE) 50 MCG/ACT nasal spray; One to 2 sprays each nostril daily  Dispense: 16 g; Refill: 6  Patient Instructions     Continue current medications. Continue good therapeutic lifestyle changes which include good diet and exercise. Fall precautions discussed with patient. If an FOBT was given today- please return it to our front desk. If you are over 33 years old - you may need Prevnar 13 or the adult Pneumonia vaccine.  Flu Shots are still available at our office. If you still haven't had one please call to set up a nurse visit to get one.   After your visit with [place] today you will receive a survey in the mail or online from [employer] regarding your care with [place]. Please take a moment to fill this out. Your feedback is very important to [place] as you can help [place] better understand your patient needs as well as improve your experience and satisfaction. WE CARE ABOUT YOU!!!   The patient should take either Allegra or Claritin once daily as an antihistamine He should use nasal Flonase 1 or 2 sprays each nostril once daily and this prescription will be called in for him. He should avoid irritating environments as much as possible   Nyra Capes MD

## 2014-10-06 ENCOUNTER — Other Ambulatory Visit (INDEPENDENT_AMBULATORY_CARE_PROVIDER_SITE_OTHER): Payer: BLUE CROSS/BLUE SHIELD

## 2014-10-06 DIAGNOSIS — F3161 Bipolar disorder, current episode mixed, mild: Secondary | ICD-10-CM

## 2014-10-06 DIAGNOSIS — I10 Essential (primary) hypertension: Secondary | ICD-10-CM | POA: Diagnosis not present

## 2014-10-06 DIAGNOSIS — E785 Hyperlipidemia, unspecified: Secondary | ICD-10-CM

## 2014-10-06 DIAGNOSIS — Z1212 Encounter for screening for malignant neoplasm of rectum: Secondary | ICD-10-CM

## 2014-10-06 NOTE — Progress Notes (Signed)
Lab only 

## 2014-10-07 LAB — LIPID PANEL
CHOLESTEROL TOTAL: 184 mg/dL (ref 100–199)
Chol/HDL Ratio: 5 ratio units (ref 0.0–5.0)
HDL: 37 mg/dL — AB (ref >39)
LDL CALC: 98 mg/dL (ref 0–99)
TRIGLYCERIDES: 247 mg/dL — AB (ref 0–149)
VLDL CHOLESTEROL CAL: 49 mg/dL — AB (ref 5–40)

## 2014-10-07 LAB — BMP8+EGFR
BUN / CREAT RATIO: 11 (ref 9–20)
BUN: 13 mg/dL (ref 6–24)
CO2: 23 mmol/L (ref 18–29)
CREATININE: 1.2 mg/dL (ref 0.76–1.27)
Calcium: 9.7 mg/dL (ref 8.7–10.2)
Chloride: 103 mmol/L (ref 97–108)
GFR calc Af Amer: 79 mL/min/{1.73_m2} (ref >59)
GFR calc non Af Amer: 69 mL/min/{1.73_m2} (ref >59)
GLUCOSE: 96 mg/dL (ref 65–99)
Potassium: 5 mmol/L (ref 3.5–5.2)
SODIUM: 142 mmol/L (ref 134–144)

## 2014-10-07 LAB — NMR, LIPOPROFILE

## 2014-10-07 LAB — LITHIUM LEVEL: LITHIUM LVL: 0.5 mmol/L — AB (ref 0.6–1.4)

## 2014-10-07 LAB — SPECIMEN STATUS REPORT

## 2014-10-08 LAB — FECAL OCCULT BLOOD, IMMUNOCHEMICAL: Fecal Occult Bld: NEGATIVE

## 2014-10-10 ENCOUNTER — Encounter: Payer: Self-pay | Admitting: Family Medicine

## 2014-10-21 ENCOUNTER — Telehealth: Payer: Self-pay | Admitting: *Deleted

## 2014-10-21 NOTE — Telephone Encounter (Signed)
Pt notified of results Verbalizes understanding Pt was fasting Informed of Dr Kathi DerMoore's recommendation

## 2014-10-21 NOTE — Telephone Encounter (Signed)
-----   Message from Ernestina Pennaonald W Moore, MD sent at 10/07/2014  2:47 PM EDT ----- The blood sugar is good at 96. The creatinine, the most important kidney function test was within normal limits. The electrolytes including potassium are good. Cholesterol numbers with advanced lipid testing were canceled.-------- please repeat this if necessary.LAB+++++ The lithium level was slightly decreased.------ as long as the patient is doing well and not having any problems we will not need to repeat this but check it again in 6 months April---specimen status report

## 2014-11-12 ENCOUNTER — Ambulatory Visit (INDEPENDENT_AMBULATORY_CARE_PROVIDER_SITE_OTHER): Payer: Worker's Compensation | Admitting: Family

## 2014-11-12 ENCOUNTER — Encounter: Payer: Self-pay | Admitting: Family

## 2014-11-12 VITALS — BP 122/80 | HR 61 | Temp 97.8°F | Ht 71.0 in | Wt 230.0 lb

## 2014-11-12 DIAGNOSIS — M545 Low back pain, unspecified: Secondary | ICD-10-CM

## 2014-11-12 MED ORDER — MELOXICAM 15 MG PO TABS: 15.0000 mg | ORAL_TABLET | Freq: Every day | ORAL | Status: DC

## 2014-11-12 MED ORDER — KETOROLAC TROMETHAMINE 60 MG/2ML IM SOLN
60.0000 mg | Freq: Once | INTRAMUSCULAR | Status: AC
  Administered 2014-11-12: 60 mg via INTRAMUSCULAR

## 2014-11-12 MED ORDER — METHYLPREDNISOLONE ACETATE 80 MG/ML IJ SUSP
80.0000 mg | Freq: Once | INTRAMUSCULAR | Status: AC
  Administered 2014-11-12: 80 mg via INTRAMUSCULAR

## 2014-11-12 NOTE — Patient Instructions (Signed)
Back Pain, Adult Low back pain is very common. About 1 in 5 people have back pain.The cause of low back pain is rarely dangerous. The pain often gets better over time.About half of people with a sudden onset of back pain feel better in just 2 weeks. About 8 in 10 people feel better by 6 weeks.  CAUSES Some common causes of back pain include:  Strain of the muscles or ligaments supporting the spine.  Wear and tear (degeneration) of the spinal discs.  Arthritis.  Direct injury to the back. DIAGNOSIS Most of the time, the direct cause of low back pain is not known.However, back pain can be treated effectively even when the exact cause of the pain is unknown.Answering your caregiver's questions about your overall health and symptoms is one of the most accurate ways to make sure the cause of your pain is not dangerous. If your caregiver needs more information, he or she may order lab work or imaging tests (X-rays or MRIs).However, even if imaging tests show changes in your back, this usually does not require surgery. HOME CARE INSTRUCTIONS For many people, back pain returns.Since low back pain is rarely dangerous, it is often a condition that people can learn to manageon their own.   Remain active. It is stressful on the back to sit or stand in one place. Do not sit, drive, or stand in one place for more than 30 minutes at a time. Take short walks on level surfaces as soon as pain allows.Try to increase the length of time you walk each day.  Do not stay in bed.Resting more than 1 or 2 days can delay your recovery.  Do not avoid exercise or work.Your body is made to move.It is not dangerous to be active, even though your back may hurt.Your back will likely heal faster if you return to being active before your pain is gone.  Pay attention to your body when you bend and lift. Many people have less discomfortwhen lifting if they bend their knees, keep the load close to their bodies,and  avoid twisting. Often, the most comfortable positions are those that put less stress on your recovering back.  Find a comfortable position to sleep. Use a firm mattress and lie on your side with your knees slightly bent. If you lie on your back, put a pillow under your knees.  Only take over-the-counter or prescription medicines as directed by your caregiver. Over-the-counter medicines to reduce pain and inflammation are often the most helpful.Your caregiver may prescribe muscle relaxant drugs.These medicines help dull your pain so you can more quickly return to your normal activities and healthy exercise.  Put ice on the injured area.  Put ice in a plastic bag.  Place a towel between your skin and the bag.  Leave the ice on for 15-20 minutes, 03-04 times a day for the first 2 to 3 days. After that, ice and heat may be alternated to reduce pain and spasms.  Ask your caregiver about trying back exercises and gentle massage. This may be of some benefit.  Avoid feeling anxious or stressed.Stress increases muscle tension and can worsen back pain.It is important to recognize when you are anxious or stressed and learn ways to manage it.Exercise is a great option. SEEK MEDICAL CARE IF:  You have pain that is not relieved with rest or medicine.  You have pain that does not improve in 1 week.  You have new symptoms.  You are generally not feeling well. SEEK   IMMEDIATE MEDICAL CARE IF:   You have pain that radiates from your back into your legs.  You develop new bowel or bladder control problems.  You have unusual weakness or numbness in your arms or legs.  You develop nausea or vomiting.  You develop abdominal pain.  You feel faint. Document Released: 04/04/2005 Document Revised: 10/04/2011 Document Reviewed: 08/06/2013 ExitCare Patient Information 2015 [employer]. This information is not intended to replace advice given to you by your health care provider. Make sure you  discuss any questions you have with your health care provider.  

## 2014-11-12 NOTE — Progress Notes (Signed)
   Subjective:    Patient ID: Melvin Roberts, male    DOB: 1960-05-05, 54 y.o.   MRN: [id number]  Back Pain This is a new problem. The current episode started yesterday. The problem occurs constantly. The problem is unchanged. The pain is present in the gluteal and lumbar spine. The quality of the pain is described as aching. The pain is at a severity of 7/10. The symptoms are aggravated by bending. Pertinent negatives include no bladder incontinence, bowel incontinence, dysuria, leg pain, numbness, tingling or weakness. He has tried muscle relaxant for the symptoms. The treatment provided mild relief.      Review of Systems  Constitutional: Negative.   HENT: Negative.   Respiratory: Negative.   Cardiovascular: Negative.   Gastrointestinal: Negative.  Negative for bowel incontinence.  Endocrine: Negative.   Genitourinary: Negative.  Negative for bladder incontinence and dysuria.  Musculoskeletal: Positive for back pain.  Neurological: Negative.  Negative for tingling, weakness and numbness.  Hematological: Negative.   Psychiatric/Behavioral: Negative.   All other systems reviewed and are negative.      Objective:   Physical Exam  Constitutional: He is oriented to person, place, and time. He appears well-developed and well-nourished. No distress.  HENT:  Head: Normocephalic.  Right Ear: External ear normal.  Left Ear: External ear normal.  Nose: Nose normal.  Mouth/Throat: Oropharynx is clear and moist.  Eyes: Pupils are equal, round, and reactive to light. Right eye exhibits no discharge. Left eye exhibits no discharge.  Neck: Normal range of motion. Neck supple. No thyromegaly present.  Cardiovascular: Normal rate, regular rhythm, normal heart sounds and intact distal pulses.   No murmur heard. Pulmonary/Chest: Effort normal and breath sounds normal. No respiratory distress. He has no wheezes.  Abdominal: Soft. Bowel sounds are normal. He exhibits no distension. There is no  tenderness.  Musculoskeletal: Normal range of motion. He exhibits no edema or tenderness.  Neurological: He is alert and oriented to person, place, and time. He has normal reflexes. No cranial nerve deficit.  Skin: Skin is warm and dry. No rash noted. No erythema.  Psychiatric: He has a normal mood and affect. His behavior is normal. Judgment and thought content normal.  Vitals reviewed.    BP 122/80 mmHg  Pulse 61  Temp(Src) 97.8 F (36.6 C) (Oral)  Ht  (1.803 m)  Wt 230 lb (104.327 kg)  BMI 32.09 kg/m2      Assessment & Plan:  1. Left-sided low back pain without sciatica -Rest -Ice and heat as needed -NO other NSAID"s while taking Mobic -Continue skelaxin as needed -RTO prn  - ketorolac (TORADOL) injection 60 mg; Inject 2 mLs (60 mg total) into the muscle once. - methylPREDNISolone acetate (DEPO-MEDROL) injection 80 mg; Inject 1 mL (80 mg total) into the muscle once. - meloxicam (MOBIC) 15 MG tablet; Take 1 tablet (15 mg total) by mouth daily.  Dispense: 30 tablet; Refill: 0  Jannifer Rodney, FNP

## 2014-11-13 ENCOUNTER — Encounter: Payer: Self-pay | Admitting: *Deleted

## 2014-11-14 ENCOUNTER — Encounter: Payer: Self-pay | Admitting: Nurse Practitioner

## 2014-11-14 ENCOUNTER — Ambulatory Visit: Payer: Worker's Compensation | Admitting: Nurse Practitioner

## 2014-11-14 VITALS — BP 119/78 | HR 61 | Temp 97.4°F | Ht 71.0 in | Wt 232.0 lb

## 2014-11-14 DIAGNOSIS — M545 Low back pain, unspecified: Secondary | ICD-10-CM

## 2014-11-14 NOTE — Patient Instructions (Signed)
Back Pain, Adult °Back pain is very common. The pain often gets better over time. The cause of back pain is usually not dangerous. Most people can learn to manage their back pain on their own.  °HOME CARE  °· Stay active. Start with short walks on flat ground if you can. Try to walk farther each day. °· Do not sit, drive, or stand in one place for more than 30 minutes. Do not stay in bed. °· Do not avoid exercise or work. Activity can help your back heal faster. °· Be careful when you bend or lift an object. Bend at your knees, keep the object close to you, and do not twist. °· Sleep on a firm mattress. Lie on your side, and bend your knees. If you lie on your back, put a pillow under your knees. °· Only take medicines as told by your doctor. °· Put ice on the injured area. °¨ Put ice in a plastic bag. °¨ Place a towel between your skin and the bag. °¨ Leave the ice on for 15-20 minutes, 03-04 times a day for the first 2 to 3 days. After that, you can switch between ice and heat packs. °· Ask your doctor about back exercises or massage. °· Avoid feeling anxious or stressed. Find good ways to deal with stress, such as exercise. °GET HELP RIGHT AWAY IF:  °· Your pain does not go away with rest or medicine. °· Your pain does not go away in 1 week. °· You have new problems. °· You do not feel well. °· The pain spreads into your legs. °· You cannot control when you poop (bowel movement) or pee (urinate). °· Your arms or legs feel weak or lose feeling (numbness). °· You feel sick to your stomach (nauseous) or throw up (vomit). °· You have belly (abdominal) pain. °· You feel like you may pass out (faint). °MAKE SURE YOU:  °· Understand these instructions. °· Will watch your condition. °· Will get help right away if you are not doing well or get worse. °Document Released: 09/21/2007 Document Revised: 06/27/2011 Document Reviewed: 08/06/2013 °ExitCare® Patient Information ©2015 [employer]. This information is not intended  to replace advice given to you by your health care provider. Make sure you discuss any questions you have with your health care provider. ° °

## 2014-11-14 NOTE — Progress Notes (Signed)
   Subjective:    Patient ID: Uziel Covault, male    DOB: 12-Oct-1960, 54 y.o.   MRN: [id number]  HPI Patient in today c/o low back pain- saw C.Hawks 2 days ago and was given steroid shot, toradol shot and mobic rx. Was also told to continue skelexin as rx. 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 Still having pain in mid lower back- describes it as a burning sensation that does not radiate down leg. he really just needs a note to be out of work.    Review of Systems  Constitutional: Negative.   HENT: Negative.   Respiratory: Negative.   Cardiovascular: Negative.   Genitourinary: Negative.   Musculoskeletal: Positive for back pain.  Neurological: Negative.   Psychiatric/Behavioral: Negative.   All other systems reviewed and are negative.      Objective:   Physical Exam  Constitutional: He is oriented to person, place, and time. He appears well-developed and well-nourished. No distress.  Cardiovascular: Normal rate, regular rhythm and normal heart sounds.   Pulmonary/Chest: Effort normal and breath sounds normal.  Musculoskeletal:  FROM of lumbar spine with pain on flexion (+) SLR on left at 90 degrees Motor strength and sensation distally intact  Neurological: He is alert and oriented to person, place, and time.  Skin: Skin is warm.  Psychiatric: He has a normal mood and affect. His behavior is normal. Judgment and thought content normal.    BP 119/78 mmHg  Pulse 61  Temp(Src) 97.4 F (36.3 C) (Oral)  Ht  (1.803 m)  Wt 232 lb (105.235 kg)  BMI 32.37 kg/m2       Assessment & Plan:   1. Midline low back pain without sciatica    Continue all meds as rx Rest Moist heat to back as needed RTO Prn  Mary-Margaret Daphine Deutscher, FNP

## 2014-12-15 ENCOUNTER — Ambulatory Visit (INDEPENDENT_AMBULATORY_CARE_PROVIDER_SITE_OTHER): Payer: Worker's Compensation

## 2014-12-15 ENCOUNTER — Encounter: Payer: Self-pay | Admitting: Family Medicine

## 2014-12-15 ENCOUNTER — Ambulatory Visit: Payer: Worker's Compensation | Admitting: Family Medicine

## 2014-12-15 VITALS — BP 137/78 | HR 58 | Temp 97.0°F | Ht 71.0 in | Wt 227.0 lb

## 2014-12-15 DIAGNOSIS — M545 Low back pain, unspecified: Secondary | ICD-10-CM

## 2014-12-15 DIAGNOSIS — M4120 Other idiopathic scoliosis, site unspecified: Secondary | ICD-10-CM

## 2014-12-15 DIAGNOSIS — M5136 Other intervertebral disc degeneration, lumbar region: Secondary | ICD-10-CM

## 2014-12-15 MED ORDER — MELOXICAM 15 MG PO TABS: 15.0000 mg | ORAL_TABLET | Freq: Every day | ORAL | Status: DC

## 2014-12-15 NOTE — Progress Notes (Signed)
Subjective:    Patient ID: Melvin Roberts, male    DOB: Jun 16, 1960, 54 y.o.   MRN: [id number]  HPI Patient here today for lower left side back pain. This happened about 3 weeks ago as well, but had gotten better. It has however started bothering him again. The pain is mostly located in the back. It does not radiate down the leg currently. It did radiate down the leg initially. He ran out of anti-inflammatory medicine.      Patient Active Problem List   Diagnosis Date Noted  . Hypertension 09/16/2013  . Bipolar disorder 03/11/2013  . Allergic rhinitis 03/11/2013  . BPH (benign prostatic hyperplasia) 03/11/2013  . DYSLIPIDEMIA 05/29/2007  . DEPRESSION, CHRONIC 05/29/2007  . SINUSITIS, RECURRENT 05/29/2007  . ESOPHAGEAL STRICTURE 05/29/2007  . GERD 05/29/2007  . HIATAL HERNIA 05/29/2007  . LOW BACK PAIN, CHRONIC 05/29/2007   Outpatient Encounter Prescriptions as of 12/15/2014  Medication Sig  . cholecalciferol (VITAMIN D) 1000 UNITS tablet Take 1,000 Units by mouth daily.  . fluticasone (FLONASE) 50 MCG/ACT nasal spray One to 2 sprays each nostril daily  . lisinopril (PRINIVIL,ZESTRIL) 10 MG tablet TAKE 1 TABLET (10 MG TOTAL) BY MOUTH DAILY.  Marland Kitchen lithium 300 MG tablet TAKE 1 TABLET 3 TIMES A DAY  . meloxicam (MOBIC) 15 MG tablet Take 1 tablet (15 mg total) by mouth daily.  . metaxalone (SKELAXIN) 800 MG tablet TAKE 1 TABLET (800 MG TOTAL) BY MOUTH 3 (THREE) TIMES DAILY.  Marland Kitchen Omega-3 Fatty Acids (FISH OIL PO) Take by mouth.  Marland Kitchen omeprazole (PRILOSEC) 20 MG capsule Take 20 mg by mouth daily.   No facility-administered encounter medications on file as of 12/15/2014.     Review of Systems  Constitutional: Negative.   HENT: Negative.   Eyes: Negative.   Respiratory: Negative.   Cardiovascular: Negative.   Gastrointestinal: Negative.   Endocrine: Negative.   Genitourinary: Negative.   Musculoskeletal: Positive for back pain (lower left side ).  Skin: Negative.     Allergic/Immunologic: Negative.   Neurological: Negative.   Hematological: Negative.   Psychiatric/Behavioral: Negative.        Objective:   Physical Exam  Constitutional: He is oriented to person, place, and time. He appears well-developed and well-nourished. No distress.  HENT:  Head: Normocephalic and atraumatic.  Eyes: Conjunctivae and EOM are normal. Pupils are equal, round, and reactive to light. Right eye exhibits no discharge. Left eye exhibits no discharge. No scleral icterus.  Neck: Normal range of motion.  Musculoskeletal: Normal range of motion. He exhibits tenderness. He exhibits no edema.  There is tenderness to the lateral aspect of the lumbar spine in the L4 L5 area.  Neurological: He is alert and oriented to person, place, and time. He has normal reflexes.  Skin: Skin is warm and dry. No rash noted.  Psychiatric: He has a normal mood and affect. His behavior is normal. Judgment and thought content normal.  Nursing note and vitals reviewed.   BP 137/78 mmHg  Pulse 58  Temp(Src) 97 F (36.1 C) (Oral)  Ht  (1.803 m)  Wt 227 lb (102.967 kg)  BMI 31.67 kg/m2       Assessment & Plan:  1. Left low back pain, with sciatica presence unspecified - DG Lumbar Spine 2-3 Views; Future  2. Left-sided low back pain without sciatica - meloxicam (MOBIC) 15 MG tablet; Take 1 tablet (15 mg total) by mouth daily. Actually take one half pills as directed Dispense: 30 tablet; Refill:  0  3. Idiopathic scoliosis  4. Degenerative disc disease, lumbar -Use warm wet compresses and take NSAID as directed  Patient Instructions  Take medicine regularly for 4 weeks after eating and only take one half of a.pill after eating. Be sure and watch stomach closely and if this bothers his stomach the patient should stop taking the medication. To protect the stomach he can take and omeprazole before the meal and take the medication after the meal Use warm wet compresses 20 minutes 3  or 4 times daily if possible Try to do the lifting with the knees and not with the back   Nyra Capes MD

## 2014-12-15 NOTE — Patient Instructions (Addendum)
Take medicine regularly for 4 weeks after eating and only take one half of a.pill after eating. Be sure and watch stomach closely and if this bothers his stomach the patient should stop taking the medication. To protect the stomach he can take and omeprazole before the meal and take the medication after the meal Use warm wet compresses 20 minutes 3 or 4 times daily if possible Try to do the lifting with the knees and not with the back

## 2015-01-06 ENCOUNTER — Other Ambulatory Visit: Payer: Self-pay | Admitting: Family Medicine

## 2015-01-07 ENCOUNTER — Telehealth: Payer: Self-pay | Admitting: Family Medicine

## 2015-01-07 DIAGNOSIS — M545 Low back pain: Secondary | ICD-10-CM

## 2015-01-07 NOTE — Telephone Encounter (Signed)
Pt called and aware referral was placed

## 2015-01-09 ENCOUNTER — Telehealth: Payer: Self-pay | Admitting: Family Medicine

## 2015-01-09 ENCOUNTER — Ambulatory Visit (INDEPENDENT_AMBULATORY_CARE_PROVIDER_SITE_OTHER): Payer: Worker's Compensation | Admitting: Family Medicine

## 2015-01-09 ENCOUNTER — Encounter: Payer: Self-pay | Admitting: Family Medicine

## 2015-01-09 VITALS — BP 113/74 | HR 64 | Temp 97.2°F | Ht 71.0 in | Wt 226.0 lb

## 2015-01-09 DIAGNOSIS — M545 Low back pain, unspecified: Secondary | ICD-10-CM

## 2015-01-09 MED ORDER — PREDNISONE 20 MG PO TABS: ORAL_TABLET | ORAL | Status: DC

## 2015-01-09 NOTE — Patient Instructions (Signed)
Back Pain, Adult Low back pain is very common. About 1 in 5 people have back pain.The cause of low back pain is rarely dangerous. The pain often gets better over time.About half of people with a sudden onset of back pain feel better in just 2 weeks. About 8 in 10 people feel better by 6 weeks.  CAUSES Some common causes of back pain include:  Strain of the muscles or ligaments supporting the spine.  Wear and tear (degeneration) of the spinal discs.  Arthritis.  Direct injury to the back. DIAGNOSIS Most of the time, the direct cause of low back pain is not known.However, back pain can be treated effectively even when the exact cause of the pain is unknown.Answering your caregiver's questions about your overall health and symptoms is one of the most accurate ways to make sure the cause of your pain is not dangerous. If your caregiver needs more information, he or she may order lab work or imaging tests (X-rays or MRIs).However, even if imaging tests show changes in your back, this usually does not require surgery. HOME CARE INSTRUCTIONS For many people, back pain returns.Since low back pain is rarely dangerous, it is often a condition that people can learn to manageon their own.   Remain active. It is stressful on the back to sit or stand in one place. Do not sit, drive, or stand in one place for more than 30 minutes at a time. Take short walks on level surfaces as soon as pain allows.Try to increase the length of time you walk each day.  Do not stay in bed.Resting more than 1 or 2 days can delay your recovery.  Do not avoid exercise or work.Your body is made to move.It is not dangerous to be active, even though your back may hurt.Your back will likely heal faster if you return to being active before your pain is gone.  Pay attention to your body when you bend and lift. Many people have less discomfortwhen lifting if they bend their knees, keep the load close to their bodies,and  avoid twisting. Often, the most comfortable positions are those that put less stress on your recovering back.  Find a comfortable position to sleep. Use a firm mattress and lie on your side with your knees slightly bent. If you lie on your back, put a pillow under your knees.  Only take over-the-counter or prescription medicines as directed by your caregiver. Over-the-counter medicines to reduce pain and inflammation are often the most helpful.Your caregiver may prescribe muscle relaxant drugs.These medicines help dull your pain so you can more quickly return to your normal activities and healthy exercise.  Put ice on the injured area.  Put ice in a plastic bag.  Place a towel between your skin and the bag.  Leave the ice on for 15-20 minutes, 03-04 times a day for the first 2 to 3 days. After that, ice and heat may be alternated to reduce pain and spasms.  Ask your caregiver about trying back exercises and gentle massage. This may be of some benefit.  Avoid feeling anxious or stressed.Stress increases muscle tension and can worsen back pain.It is important to recognize when you are anxious or stressed and learn ways to manage it.Exercise is a great option. SEEK MEDICAL CARE IF:  You have pain that is not relieved with rest or medicine.  You have pain that does not improve in 1 week.  You have new symptoms.  You are generally not feeling well. SEEK   IMMEDIATE MEDICAL CARE IF:   You have pain that radiates from your back into your legs.  You develop new bowel or bladder control problems.  You have unusual weakness or numbness in your arms or legs.  You develop nausea or vomiting.  You develop abdominal pain.  You feel faint. Document Released: 04/04/2005 Document Revised: 10/04/2011 Document Reviewed: 08/06/2013 ExitCare Patient Information 2015 [employer]. This information is not intended to replace advice given to you by your health care provider. Make sure you  discuss any questions you have with your health care provider.  

## 2015-01-09 NOTE — Assessment & Plan Note (Signed)
Likely muscular strain. Patient had x-ray and would like referral to orthopedics. No red flags of numbness or weakness or tingling. Continue physical therapy and gave a short course of steroids.

## 2015-01-09 NOTE — Progress Notes (Signed)
BP 113/74 mmHg  Pulse 64  Temp(Src) 97.2 F (36.2 C) (Oral)  Ht '5\' 11"'  (1.803 m)  Wt 226 lb (102.513 kg)  BMI 31.53 kg/m2   Subjective:    Patient ID: Melvin Roberts, male    DOB: [birth year], 54 y.o.   MRN: [id number]  HPI: Melvin Roberts is a 54 y.o. male presenting on 01/09/2015 for Back Pain   HPI Back pain Injured 11/11/2014. Heavy block of steel( engine cradle) slipped off and tried to catch it before smashing onto his feet. He felt a tight pull in his back right away and has pain since. He works at [employer]. He has a referral for orthopedic already but has not had a chance to go there. He also had x-rays of his lower back which showed some mild degenerative changes but no other abnormalities. He has been using Mobic and Skelaxin with some help. Feels like a steroid shot helped the most when they gave him a few months ago. He has no radiation down into his legs and there is also no numbness or weakness down his legs either. He feels like he can lift still up to 40-50 pounds just not consistently.  Relevant past medical, surgical, family and social history reviewed and updated as indicated. Interim medical history since our last visit reviewed. Allergies and medications reviewed and updated.  Review of Systems  Constitutional: Negative for fever and chills.  HENT: Negative for ear discharge and ear pain.   Eyes: Negative for discharge and visual disturbance.  Respiratory: Negative for shortness of breath and wheezing.   Cardiovascular: Negative for chest pain and leg swelling.  Gastrointestinal: Negative for abdominal pain, diarrhea and constipation.  Genitourinary: Negative for difficulty urinating.  Musculoskeletal: Positive for back pain. Negative for gait problem.  Skin: Negative for rash.  Neurological: Negative for syncope, light-headedness and headaches.  All other systems reviewed and are negative.   Per HPI unless specifically indicated above     Medication List         This list is accurate as of: 01/09/15 11:28 AM.  Always use your most recent med list.               cholecalciferol 1000 UNITS tablet  Commonly known as:  VITAMIN D  Take 1,000 Units by mouth daily.     FISH OIL PO  Take by mouth.     fluticasone 50 MCG/ACT nasal spray  Commonly known as:  FLONASE  One to 2 sprays each nostril daily     lisinopril 10 MG tablet  Commonly known as:  PRINIVIL,ZESTRIL  TAKE 1 TABLET (10 MG TOTAL) BY MOUTH DAILY.     lithium 300 MG tablet  TAKE 1 TABLET 3 TIMES A DAY     meloxicam 15 MG tablet  Commonly known as:  MOBIC  Take 1 tablet (15 mg total) by mouth daily.     metaxalone 800 MG tablet  Commonly known as:  SKELAXIN  TAKE 1 TABLET (800 MG TOTAL) BY MOUTH 3 (THREE) TIMES DAILY.     omeprazole 20 MG capsule  Commonly known as:  PRILOSEC  Take 20 mg by mouth daily.     predniSONE 20 MG tablet  Commonly known as:  DELTASONE  2 po at same time daily for 5 days           Objective:    BP 113/74 mmHg  Pulse 64  Temp(Src) 97.2 F (36.2 C) (Oral)  Ht '5\' 11"'  (1.803 m)  Wt 226 lb (102.513 kg)  BMI 31.53 kg/m2  Wt Readings from Last 3 Encounters:  01/09/15 226 lb (102.513 kg)  12/15/14 227 lb (102.967 kg)  11/14/14 232 lb (105.235 kg)    Physical Exam  Constitutional: He is oriented to person, place, and time. He appears well-developed and well-nourished. No distress.  Eyes: Conjunctivae and EOM are normal. Right eye exhibits no discharge. No scleral icterus.  Cardiovascular: Normal rate, regular rhythm, normal heart sounds and intact distal pulses.   No murmur heard. Pulmonary/Chest: Effort normal and breath sounds normal. No respiratory distress. He has no wheezes.  Abdominal: He exhibits no distension.  Musculoskeletal: Normal range of motion. He exhibits no edema.       Lumbar back: He exhibits tenderness (Left lower back, negative straight leg raise). He exhibits no bony tenderness, no swelling and no edema.        Back:  Neurological: He is alert and oriented to person, place, and time. Coordination normal.  Skin: Skin is warm and dry. No rash noted. He is not diaphoretic.  Psychiatric: He has a normal mood and affect. His behavior is normal.  Vitals reviewed.   Results for orders placed or performed in visit on 10/06/14  Fecal occult blood, imunochemical  Result Value Ref Range   Fecal Occult Bld Negative Negative  BMP8+EGFR  Result Value Ref Range   Glucose 96 65 - 99 mg/dL   BUN 13 6 - 24 mg/dL   Creatinine, Ser 1.20 0.76 - 1.27 mg/dL   GFR calc non Af Amer 69 >59 mL/min/1.73   GFR calc Af Amer 79 >59 mL/min/1.73   BUN/Creatinine Ratio 11 9 - 20   Sodium 142 134 - 144 mmol/L   Potassium 5.0 3.5 - 5.2 mmol/L   Chloride 103 97 - 108 mmol/L   CO2 23 18 - 29 mmol/L   Calcium 9.7 8.7 - 10.2 mg/dL  NMR, lipoprofile  Result Value Ref Range   LDL Particle Number CANCELED nmol/L   LDL-C CANCELED mg/dL   HDL Cholesterol by NMR CANCELED    Triglycerides by NMR CANCELED    Cholesterol CANCELED    HDL Particle Number CANCELED   Lithium level  Result Value Ref Range   Lithium Lvl 0.5 (L) 0.6 - 1.4 mmol/L  Specimen status report  Result Value Ref Range   specimen status report Comment   Lipid panel  Result Value Ref Range   Cholesterol, Total 184 100 - 199 mg/dL   Triglycerides 247 (H) 0 - 149 mg/dL   HDL 37 (L) >39 mg/dL   VLDL Cholesterol Cal 49 (H) 5 - 40 mg/dL   LDL Calculated 98 0 - 99 mg/dL   Chol/HDL Ratio 5.0 0.0 - 5.0 ratio units  Specimen status report  Result Value Ref Range   specimen status report Comment       Assessment & Plan:   Problem List Items Addressed This Visit      Other   LOW BACK PAIN, CHRONIC - Primary    Likely muscular strain. Patient had x-ray and would like referral to orthopedics. No red flags of numbness or weakness or tingling. Continue physical therapy and gave a short course of steroids.      Relevant Medications   predniSONE (DELTASONE) 20  MG tablet       Follow up plan: Return if symptoms worsen or fail to improve.  Caryl Pina, MD [facility] 01/09/2015, 11:28 AM

## 2015-01-19 ENCOUNTER — Telehealth: Payer: Self-pay | Admitting: Family Medicine

## 2015-01-19 NOTE — Telephone Encounter (Signed)
Patient was informed that referral had been requested but we have not had a reply yet with an appointment.  Melvin Roberts, will you please check on this and contact the patient with an update.

## 2015-03-30 ENCOUNTER — Ambulatory Visit: Payer: BLUE CROSS/BLUE SHIELD | Admitting: Family Medicine

## 2015-03-31 ENCOUNTER — Ambulatory Visit: Payer: BLUE CROSS/BLUE SHIELD | Admitting: Family Medicine

## 2015-04-06 ENCOUNTER — Ambulatory Visit: Payer: BLUE CROSS/BLUE SHIELD | Admitting: Family Medicine

## 2015-04-16 ENCOUNTER — Ambulatory Visit (INDEPENDENT_AMBULATORY_CARE_PROVIDER_SITE_OTHER): Payer: BLUE CROSS/BLUE SHIELD | Admitting: Family Medicine

## 2015-04-16 ENCOUNTER — Encounter: Payer: Self-pay | Admitting: Family Medicine

## 2015-04-16 VITALS — BP 128/87 | HR 57 | Temp 99.4°F | Ht 71.0 in | Wt 235.2 lb

## 2015-04-16 DIAGNOSIS — J019 Acute sinusitis, unspecified: Secondary | ICD-10-CM

## 2015-04-16 MED ORDER — AZITHROMYCIN 250 MG PO TABS: ORAL_TABLET | ORAL | Status: DC

## 2015-04-16 NOTE — Progress Notes (Addendum)
BP 128/87 mmHg  Pulse 57  Temp(Src) 99.4 F (37.4 C) (Oral)  Ht 5\' 11"  (1.803 m)  Wt 235 lb 3.2 oz (106.686 kg)  BMI 32.82 kg/m2   Subjective:    Patient ID: Melvin Roberts, male    DOB: 05/16/1960, 54 y.o.   MRN: [id number]  HPI: Melvin Roberts is a 54 y.o. male presenting on 04/16/2015 for Sinusitis   HPI Sinus congestion Patient has developed sinus congestion and pressure especially under his eyes. This is like when he usually gets when he gets his sinus infections recurrently. He is going to see an ENT Dr. and an allergist. The ENT doctor did confirm that he gets recurrent chronic sinusitis. After the last round of antibiotics since that everything cleared up but this current episode has been going on for a week and a half to 2 weeks. He denies any fevers or chills. He is getting a lot of postnasal drainage and drainage front of his nose. His drainage is mostly thick and yellow.  Relevant past medical, surgical, family and social history reviewed and updated as indicated. Interim medical history since our last visit reviewed. Allergies and medications reviewed and updated.  Review of Systems  Constitutional: Negative for fever and chills.  HENT: Positive for congestion, postnasal drip, rhinorrhea, sinus pressure and sore throat. Negative for ear discharge, ear pain, sneezing and voice change.   Eyes: Negative for pain, discharge, redness and visual disturbance.  Respiratory: Positive for cough. Negative for shortness of breath and wheezing.   Cardiovascular: Negative for chest pain and leg swelling.  Gastrointestinal: Negative for abdominal pain, diarrhea and constipation.  Genitourinary: Negative for difficulty urinating.  Musculoskeletal: Negative for back pain and gait problem.  Skin: Negative for rash.  Neurological: Negative for syncope, light-headedness and headaches.  All other systems reviewed and are negative.   Per HPI unless specifically indicated above       Medication List       This list is accurate as of: 04/16/15 10:22 AM.  Always use your most recent med list.               azithromycin 250 MG tablet  Commonly known as:  ZITHROMAX  Take 2 the first day and then one each day after.     cholecalciferol 1000 units tablet  Commonly known as:  VITAMIN D  Take 1,000 Units by mouth daily.     FISH OIL PO  Take by mouth.     fluticasone 50 MCG/ACT nasal spray  Commonly known as:  FLONASE  One to 2 sprays each nostril daily     lisinopril 10 MG tablet  Commonly known as:  PRINIVIL,ZESTRIL  TAKE 1 TABLET (10 MG TOTAL) BY MOUTH DAILY.     lithium 300 MG tablet  TAKE 1 TABLET 3 TIMES A DAY     meloxicam 15 MG tablet  Commonly known as:  MOBIC  Take 1 tablet (15 mg total) by mouth daily.     metaxalone 800 MG tablet  Commonly known as:  SKELAXIN  TAKE 1 TABLET (800 MG TOTAL) BY MOUTH 3 (THREE) TIMES DAILY.     omeprazole 20 MG capsule  Commonly known as:  PRILOSEC  Take 20 mg by mouth daily.           Objective:    BP 128/87 mmHg  Pulse 57  Temp(Src) 99.4 F (37.4 C) (Oral)  Ht 5\' 11"  (1.803 m)  Wt 235 lb 3.2 oz (106.686 kg)  BMI 32.82 kg/m2  Wt Readings from Last 3 Encounters:  04/16/15 235 lb 3.2 oz (106.686 kg)  01/09/15 226 lb (102.513 kg)  12/15/14 227 lb (102.967 kg)    Physical Exam  Constitutional: He is oriented to person, place, and time. He appears well-developed and well-nourished. No distress.  HENT:  Right Ear: Tympanic membrane, external ear and ear canal normal.  Left Ear: Tympanic membrane, external ear and ear canal normal.  Nose: Mucosal edema and rhinorrhea present. No sinus tenderness. No epistaxis. Right sinus exhibits maxillary sinus tenderness. Right sinus exhibits no frontal sinus tenderness. Left sinus exhibits maxillary sinus tenderness. Left sinus exhibits no frontal sinus tenderness.  Mouth/Throat: Uvula is midline and mucous membranes are normal. Posterior oropharyngeal edema and  posterior oropharyngeal erythema present. No oropharyngeal exudate or tonsillar abscesses.  Eyes: Conjunctivae and EOM are normal. Pupils are equal, round, and reactive to light. Right eye exhibits no discharge. No scleral icterus.  Neck: Neck supple. No thyromegaly present.  Cardiovascular: Normal rate, regular rhythm, normal heart sounds and intact distal pulses.   No murmur heard. Pulmonary/Chest: Effort normal and breath sounds normal. No respiratory distress. He has no wheezes.  Musculoskeletal: Normal range of motion. He exhibits no edema.  Lymphadenopathy:    He has no cervical adenopathy.  Neurological: He is alert and oriented to person, place, and time. Coordination normal.  Skin: Skin is warm and dry. No rash noted. He is not diaphoretic.  Psychiatric: He has a normal mood and affect. His behavior is normal.  Vitals reviewed.     Assessment & Plan:   Problem List Items Addressed This Visit    None    Visit Diagnoses    Acute rhinosinusitis    -  Primary    Patient gets this chronically but has temperature elevation, has been using Flonase, will try azithromycin    Relevant Medications    azithromycin (ZITHROMAX) 250 MG tablet        Follow up plan: Return if symptoms worsen or fail to improve.  Counseling provided for all of the vaccine components No orders of the defined types were placed in this encounter.    Arville Care, MD [facility] 04/16/2015, 10:22 AM

## 2015-05-04 ENCOUNTER — Ambulatory Visit: Payer: BLUE CROSS/BLUE SHIELD | Admitting: Family Medicine

## 2015-05-07 ENCOUNTER — Ambulatory Visit: Payer: BLUE CROSS/BLUE SHIELD | Admitting: Family Medicine

## 2015-05-18 ENCOUNTER — Encounter: Payer: Self-pay | Admitting: Family Medicine

## 2015-05-18 ENCOUNTER — Ambulatory Visit (INDEPENDENT_AMBULATORY_CARE_PROVIDER_SITE_OTHER): Payer: BLUE CROSS/BLUE SHIELD

## 2015-05-18 ENCOUNTER — Ambulatory Visit (INDEPENDENT_AMBULATORY_CARE_PROVIDER_SITE_OTHER): Payer: BLUE CROSS/BLUE SHIELD | Admitting: Family Medicine

## 2015-05-18 VITALS — BP 124/81 | HR 67 | Temp 97.7°F | Ht 71.0 in | Wt 232.0 lb

## 2015-05-18 DIAGNOSIS — I10 Essential (primary) hypertension: Secondary | ICD-10-CM

## 2015-05-18 DIAGNOSIS — F3170 Bipolar disorder, currently in remission, most recent episode unspecified: Secondary | ICD-10-CM | POA: Diagnosis not present

## 2015-05-18 DIAGNOSIS — N400 Enlarged prostate without lower urinary tract symptoms: Secondary | ICD-10-CM | POA: Diagnosis not present

## 2015-05-18 DIAGNOSIS — K219 Gastro-esophageal reflux disease without esophagitis: Secondary | ICD-10-CM

## 2015-05-18 DIAGNOSIS — E559 Vitamin D deficiency, unspecified: Secondary | ICD-10-CM

## 2015-05-18 DIAGNOSIS — E785 Hyperlipidemia, unspecified: Secondary | ICD-10-CM | POA: Diagnosis not present

## 2015-05-18 NOTE — Progress Notes (Signed)
 Subjective:    Patient ID: Melvin Roberts, male    DOB: 04/05/1961, 55 y.o.   MRN: [id number]  HPI  Pt here for follow up and management of chronic medical problems which includes bipolar, hyperlipidemia and hypertension. He is taking medications regularly. The patient has no complaints. He is happy at work. His moods are good. No complaints from his wife. His family history is stable he has an older sister and younger sister and they're in good health. His father died of small cell lung cancer and was a smoker. His mother still living and in good health. The patient denies chest pain shortness of breath trouble swallowing and heartburn indigestion and nausea vomiting diarrhea or blood in the stool. He is passing his water without problems.      Patient Active Problem List   Diagnosis Date Noted  . Hypertension 09/16/2013  . Bipolar disorder (HCC) 03/11/2013  . Allergic rhinitis 03/11/2013  . BPH (benign prostatic hyperplasia) 03/11/2013  . DYSLIPIDEMIA 05/29/2007  . DEPRESSION, CHRONIC 05/29/2007  . SINUSITIS, RECURRENT 05/29/2007  . ESOPHAGEAL STRICTURE 05/29/2007  . GERD 05/29/2007  . HIATAL HERNIA 05/29/2007  . LOW BACK PAIN, CHRONIC 05/29/2007   Outpatient Encounter Prescriptions as of 05/18/2015  Medication Sig  . cholecalciferol (VITAMIN D) 1000 UNITS tablet Take 1,000 Units by mouth daily.  . fluticasone (FLONASE) 50 MCG/ACT nasal spray One to 2 sprays each nostril daily  . lisinopril (PRINIVIL,ZESTRIL) 10 MG tablet TAKE 1 TABLET (10 MG TOTAL) BY MOUTH DAILY.  . lithium 300 MG tablet TAKE 1 TABLET 3 TIMES A DAY  . meloxicam (MOBIC) 15 MG tablet Take 1 tablet (15 mg total) by mouth daily. (Patient taking differently: Take 15 mg by mouth daily. As needed)  . metaxalone (SKELAXIN) 800 MG tablet TAKE 1 TABLET (800 MG TOTAL) BY MOUTH 3 (THREE) TIMES DAILY. (Patient taking differently: as needed)  . Omega-3 Fatty Acids (FISH OIL PO) Take by mouth.  . omeprazole (PRILOSEC) 20 MG  capsule Take 20 mg by mouth daily.  . [DISCONTINUED] azithromycin (ZITHROMAX) 250 MG tablet Take 2 the first day and then one each day after.   No facility-administered encounter medications on file as of 05/18/2015.      Review of Systems  Constitutional: Negative.   HENT: Negative.   Eyes: Negative.   Respiratory: Negative.   Cardiovascular: Negative.   Gastrointestinal: Negative.   Endocrine: Negative.   Genitourinary: Negative.   Musculoskeletal: Negative.   Skin: Negative.   Allergic/Immunologic: Negative.   Neurological: Negative.   Hematological: Negative.   Psychiatric/Behavioral: Negative.        Objective:   Physical Exam  Constitutional: He is oriented to person, place, and time. He appears well-developed and well-nourished. No distress.  HENT:  Head: Normocephalic and atraumatic.  Right Ear: External ear normal.  Left Ear: External ear normal.  Nose: Nose normal.  Mouth/Throat: Oropharynx is clear and moist. No oropharyngeal exudate.  Eyes: Conjunctivae and EOM are normal. Pupils are equal, round, and reactive to light. Right eye exhibits no discharge. Left eye exhibits no discharge. No scleral icterus.  Neck: Normal range of motion. Neck supple. No thyromegaly present.  Cardiovascular: Normal rate, regular rhythm, normal heart sounds and intact distal pulses.   No murmur heard. At 60/m  Pulmonary/Chest: Effort normal and breath sounds normal. No respiratory distress. He has no wheezes. He has no rales. He exhibits no tenderness.  Clear anteriorly and posteriorly no axillary adenopathy  Abdominal: Soft. Bowel sounds are normal.   He exhibits no mass. There is no tenderness. There is no rebound and no guarding.  No liver or spleen abnormality  and no masses. No inguinal adenopathy  Musculoskeletal: Normal range of motion. He exhibits no edema or tenderness.  Lymphadenopathy:    He has no cervical adenopathy.  Neurological: He is alert and oriented to person,  place, and time.  Skin: Skin is warm and dry. No rash noted.  Psychiatric: He has a normal mood and affect. His behavior is normal. Judgment and thought content normal.  Nursing note and vitals reviewed.  BP 124/81 mmHg  Pulse 67  Temp(Src) 97.7 F (36.5 C) (Oral)  Ht 5' 11" (1.803 m)  Wt 232 lb (105.235 kg)  BMI 32.37 kg/m2  [facility] reading (PRIMARY) by  Dr. Moore-chest x-ray-  no active disease                                      Assessment & Plan:  1. Essential hypertension, benign -The blood pressure is good today the patient will continue with current treatment - BMP8+EGFR - CBC with Differential/Platelet - Hepatic function panel - DG Chest 2 View; Future  2. Hyperlipemia -Patient will continue with omega-3 fatty acids and aggressive therapeutic lifestyle changes to help keep his cholesterol under control. - CBC with Differential/Platelet - Lipid panel  3. BPH (benign prostatic hyperplasia) -No complaints with voiding. - CBC with Differential/Platelet  4. Gastroesophageal reflux disease, esophagitis presence not specified -No GERD symptoms and he will continue with his omeprazole as doing - CBC with Differential/Platelet  5. Bipolar affective disorder in remission (HCC) -Blood levels are pending on lithium and patient is doing well currently and no change is planned at this time with medication - CBC with Differential/Platelet - Lithium level  6. Hyperlipidemia -Continue with aggressive therapeutic lifestyle changes and omega-3 fatty acids - CBC with Differential/Platelet - Lipid panel  7. Vitamin D deficiency -Continue vitamin D replacement pending results of lab work - CBC with Differential/Platelet - VITAMIN D 25 Hydroxy (Vit-D Deficiency, Fractures)  Patient Instructions  Continue current medications. Continue good therapeutic lifestyle changes which include good diet and exercise. Fall precautions discussed with patient. If an FOBT was given today-  please return it to our front desk. If you are over 50 years old - you may need Prevnar 13 or the adult Pneumonia vaccine.  **Flu shots are available--- please call and schedule a FLU-CLINIC appointment**  After your visit with us today you will receive a survey in the mail or online from [employer] regarding your care with us. Please take a moment to fill this out. Your feedback is very important to us as you can help us better understand your patient needs as well as improve your experience and satisfaction. WE CARE ABOUT YOU!!!   We will call with lab work results and chest x-ray results as soon as these results become available Check with your insurance regarding the Prevnar vaccine This winter continue to drink plenty of fluids and stay well hydrated and use nasal saline   Don W. Moore MD   

## 2015-05-18 NOTE — Patient Instructions (Addendum)
Continue current medications. Continue good therapeutic lifestyle changes which include good diet and exercise. Fall precautions discussed with patient. If an FOBT was given today- please return it to our front desk. If you are over 55 years old - you may need Prevnar 13 or the adult Pneumonia vaccine.  **Flu shots are available--- please call and schedule a FLU-CLINIC appointment**  After your visit with [place] today you will receive a survey in the mail or online from [employer] regarding your care with [place]. Please take a moment to fill this out. Your feedback is very important to [place] as you can help [place] better understand your patient needs as well as improve your experience and satisfaction. WE CARE ABOUT YOU!!!   We will call with lab work results and chest x-ray results as soon as these results become available Check with your insurance regarding the Prevnar vaccine This winter continue to drink plenty of fluids and stay well hydrated and use nasal saline

## 2015-05-19 LAB — HEPATIC FUNCTION PANEL
ALBUMIN: 4.5 g/dL (ref 3.5–5.5)
ALK PHOS: 67 IU/L (ref 39–117)
ALT: 34 IU/L (ref 0–44)
AST: 29 IU/L (ref 0–40)
Bilirubin, Direct: 0.07 mg/dL (ref 0.00–0.40)
TOTAL PROTEIN: 6.8 g/dL (ref 6.0–8.5)

## 2015-05-19 LAB — LIPID PANEL
CHOL/HDL RATIO: 6.8 ratio — AB (ref 0.0–5.0)
Cholesterol, Total: 203 mg/dL — ABNORMAL HIGH (ref 100–199)
HDL: 30 mg/dL — ABNORMAL LOW (ref >39)
TRIGLYCERIDES: 738 mg/dL — AB (ref 0–149)

## 2015-05-19 LAB — CBC WITH DIFFERENTIAL/PLATELET
BASOS: 0 %
Basophils Absolute: 0 10*3/uL (ref 0.0–0.2)
EOS (ABSOLUTE): 0.1 10*3/uL (ref 0.0–0.4)
EOS: 1 %
HEMATOCRIT: 39.2 % (ref 37.5–51.0)
Hemoglobin: 13.1 g/dL (ref 12.6–17.7)
IMMATURE GRANS (ABS): 0 10*3/uL (ref 0.0–0.1)
IMMATURE GRANULOCYTES: 0 %
LYMPHS: 26 %
Lymphocytes Absolute: 2.5 10*3/uL (ref 0.7–3.1)
MCH: 28.5 pg (ref 26.6–33.0)
MCHC: 33.4 g/dL (ref 31.5–35.7)
MCV: 85 fL (ref 79–97)
Monocytes Absolute: 0.4 10*3/uL (ref 0.1–0.9)
Monocytes: 5 %
NEUTROS PCT: 68 %
Neutrophils Absolute: 6.5 10*3/uL (ref 1.4–7.0)
PLATELETS: 265 10*3/uL (ref 150–379)
RBC: 4.59 x10E6/uL (ref 4.14–5.80)
RDW: 14.4 % (ref 12.3–15.4)
WBC: 9.7 10*3/uL (ref 3.4–10.8)

## 2015-05-19 LAB — LITHIUM LEVEL: Lithium Lvl: 0.7 mmol/L (ref 0.6–1.4)

## 2015-05-19 LAB — VITAMIN D 25 HYDROXY (VIT D DEFICIENCY, FRACTURES): VIT D 25 HYDROXY: 22 ng/mL — AB (ref 30.0–100.0)

## 2015-05-19 LAB — BMP8+EGFR
BUN/Creatinine Ratio: 15 (ref 9–20)
BUN: 13 mg/dL (ref 6–24)
CALCIUM: 9.7 mg/dL (ref 8.7–10.2)
CO2: 22 mmol/L (ref 18–29)
CREATININE: 0.89 mg/dL (ref 0.76–1.27)
Chloride: 100 mmol/L (ref 96–106)
GFR calc Af Amer: 112 mL/min/{1.73_m2} (ref >59)
GFR, EST NON AFRICAN AMERICAN: 97 mL/min/{1.73_m2} (ref >59)
Glucose: 92 mg/dL (ref 65–99)
Potassium: 4.2 mmol/L (ref 3.5–5.2)
Sodium: 138 mmol/L (ref 134–144)

## 2015-05-21 ENCOUNTER — Telehealth: Payer: Self-pay

## 2015-05-21 NOTE — Telephone Encounter (Signed)
[facility] to x-ray for CXR results

## 2015-05-27 ENCOUNTER — Telehealth: Payer: Self-pay | Admitting: Family Medicine

## 2015-06-02 ENCOUNTER — Encounter: Payer: Self-pay | Admitting: Pediatrics

## 2015-06-02 ENCOUNTER — Ambulatory Visit (INDEPENDENT_AMBULATORY_CARE_PROVIDER_SITE_OTHER): Payer: BLUE CROSS/BLUE SHIELD | Admitting: Pediatrics

## 2015-06-02 VITALS — BP 113/77 | HR 62 | Temp 97.0°F | Ht 71.0 in | Wt 233.0 lb

## 2015-06-02 DIAGNOSIS — J069 Acute upper respiratory infection, unspecified: Secondary | ICD-10-CM | POA: Diagnosis not present

## 2015-06-02 DIAGNOSIS — J309 Allergic rhinitis, unspecified: Secondary | ICD-10-CM | POA: Diagnosis not present

## 2015-06-02 MED ORDER — CETIRIZINE HCL 10 MG PO CAPS
10.0000 mg | ORAL_CAPSULE | Freq: Every day | ORAL | Status: DC
Start: 2015-06-02 — End: 2015-11-10

## 2015-06-02 MED ORDER — MECLIZINE HCL 12.5 MG PO TABS: 12.5000 mg | ORAL_TABLET | Freq: Three times a day (TID) | ORAL | Status: DC | PRN

## 2015-06-02 NOTE — Progress Notes (Signed)
Subjective:    Patient ID: Melvin Roberts, male    DOB: 1960/08/05, 55 y.o.   MRN: [id number]  CC: Sinusitis   HPI: Melvin Roberts is a 55 y.o. male presenting for Sinusitis  Started havign symptoms yesterday, felt slightly tired two days ago Head plugged up Feeling slightly dizzy at times, doesn't come on with head movements regularly, lasts for seconds No falls Had a sinus scan several years ago No fevers Some sore throat No cough Headaches sometimes  Had zpak a few weeks ago for congestion Using flonase regularly, has been helping   Depression screen [facility] 2/9 05/18/2015 04/16/2015 01/09/2015 09/29/2014 03/24/2014  Decreased Interest 0 0 0 0 0  Down, Depressed, Hopeless 0 0 0 1 0  PHQ - 2 Score 0 0 0 1 0     Relevant past medical, surgical, family and social history reviewed and updated as indicated. Interim medical history since our last visit reviewed. Allergies and medications reviewed and updated.    ROS: Per HPI unless specifically indicated above  History  Smoking status  . Never Smoker   Smokeless tobacco  . Never Used    Past Medical History Patient Active Problem List   Diagnosis Date Noted  . Hypertension 09/16/2013  . Bipolar disorder (HCC) 03/11/2013  . Allergic rhinitis 03/11/2013  . BPH (benign prostatic hyperplasia) 03/11/2013  . Hyperlipidemia 05/29/2007  . DEPRESSION, CHRONIC 05/29/2007  . SINUSITIS, RECURRENT 05/29/2007  . ESOPHAGEAL STRICTURE 05/29/2007  . GERD 05/29/2007  . HIATAL HERNIA 05/29/2007  . LOW BACK PAIN, CHRONIC 05/29/2007    Current Outpatient Prescriptions  Medication Sig Dispense Refill  . cholecalciferol (VITAMIN D) 1000 UNITS tablet Take 1,000 Units by mouth daily.    . fluticasone (FLONASE) 50 MCG/ACT nasal spray One to 2 sprays each nostril daily 16 g 6  . lisinopril (PRINIVIL,ZESTRIL) 10 MG tablet TAKE 1 TABLET (10 MG TOTAL) BY MOUTH DAILY. 30 tablet 11  . lithium 300 MG tablet TAKE 1 TABLET 3 TIMES A DAY 270  tablet 3  . meloxicam (MOBIC) 15 MG tablet Take 1 tablet (15 mg total) by mouth daily. (Patient taking differently: Take 15 mg by mouth daily. As needed) 30 tablet 0  . metaxalone (SKELAXIN) 800 MG tablet TAKE 1 TABLET (800 MG TOTAL) BY MOUTH 3 (THREE) TIMES DAILY. (Patient taking differently: as needed) 90 tablet 1  . Omega-3 Fatty Acids (FISH OIL PO) Take by mouth.    Marland Kitchen omeprazole (PRILOSEC) 20 MG capsule Take 20 mg by mouth daily.    . Cetirizine HCl 10 MG CAPS Take 1 capsule (10 mg total) by mouth daily. 30 capsule 6  . meclizine (ANTIVERT) 12.5 MG tablet Take 1 tablet (12.5 mg total) by mouth 3 (three) times daily as needed for dizziness. 30 tablet 0   No current facility-administered medications for this visit.       Objective:    BP 113/77 mmHg  Pulse 62  Temp(Src) 97 F (36.1 C) (Oral)  Ht  (1.803 m)  Wt 233 lb (105.688 kg)  BMI 32.51 kg/m2  Wt Readings from Last 3 Encounters:  06/02/15 233 lb (105.688 kg)  05/18/15 232 lb (105.235 kg)  04/16/15 235 lb 3.2 oz (106.686 kg)     Gen: NAD, alert, cooperative with exam, NCAT EYES: EOMI, no scleral injection or icterus ENT:  TMs slightly pink b/l with nl LR. OP without erythema, slighlty tender over frontal sinuses b/l LYMPH: no cervical LAD CV: NRRR, normal S1/S2, no murmur,  distal pulses 2+ b/l Resp: CTABL, no wheezes, normal WOB Abd: +BS, soft, NTND.  Ext: No edema, warm Neuro: Alert and oriented, strength equal b/l UE and LE, coordination grossly normal MSK: normal muscle bulk     Assessment & Plan:    Melvin Roberts was seen today for congestion for past two days, likely related to acute URI vs allergies. Discussed symptomatic care, start sinus rinses, treat allergies with flonase and zyrtec. If not improving next week let me know. No indications for antibiotics at this time.  Diagnoses and all orders for this visit:  Allergic rhinitis, unspecified allergic rhinitis type -     meclizine (ANTIVERT) 12.5 MG tablet;  Take 1 tablet (12.5 mg total) by mouth 3 (three) times daily as needed for dizziness. -     Cetirizine HCl 10 MG CAPS; Take 1 capsule (10 mg total) by mouth daily.  Acute URI     Follow up plan: prn  Melvin Kras, MD [facility] 06/02/2015, 6:03 PM

## 2015-06-02 NOTE — Patient Instructions (Signed)
Netipot with distilled water 2-3 times a day to clear out sinuses Or Normal saline nasal spray Flonase steroid nasal spray Ibuprofen 600mg three times a day Lots of fluids   

## 2015-06-04 IMAGING — CR DG CHEST 2V
2 series · 2 of 2 positions shown · non-contrast
Comparison: None.

CLINICAL DATA: Hyperlipidemia, bipolar disorder, annual exam

EXAM:
CHEST  2 VIEW

[view not recorded (1 of 2)]
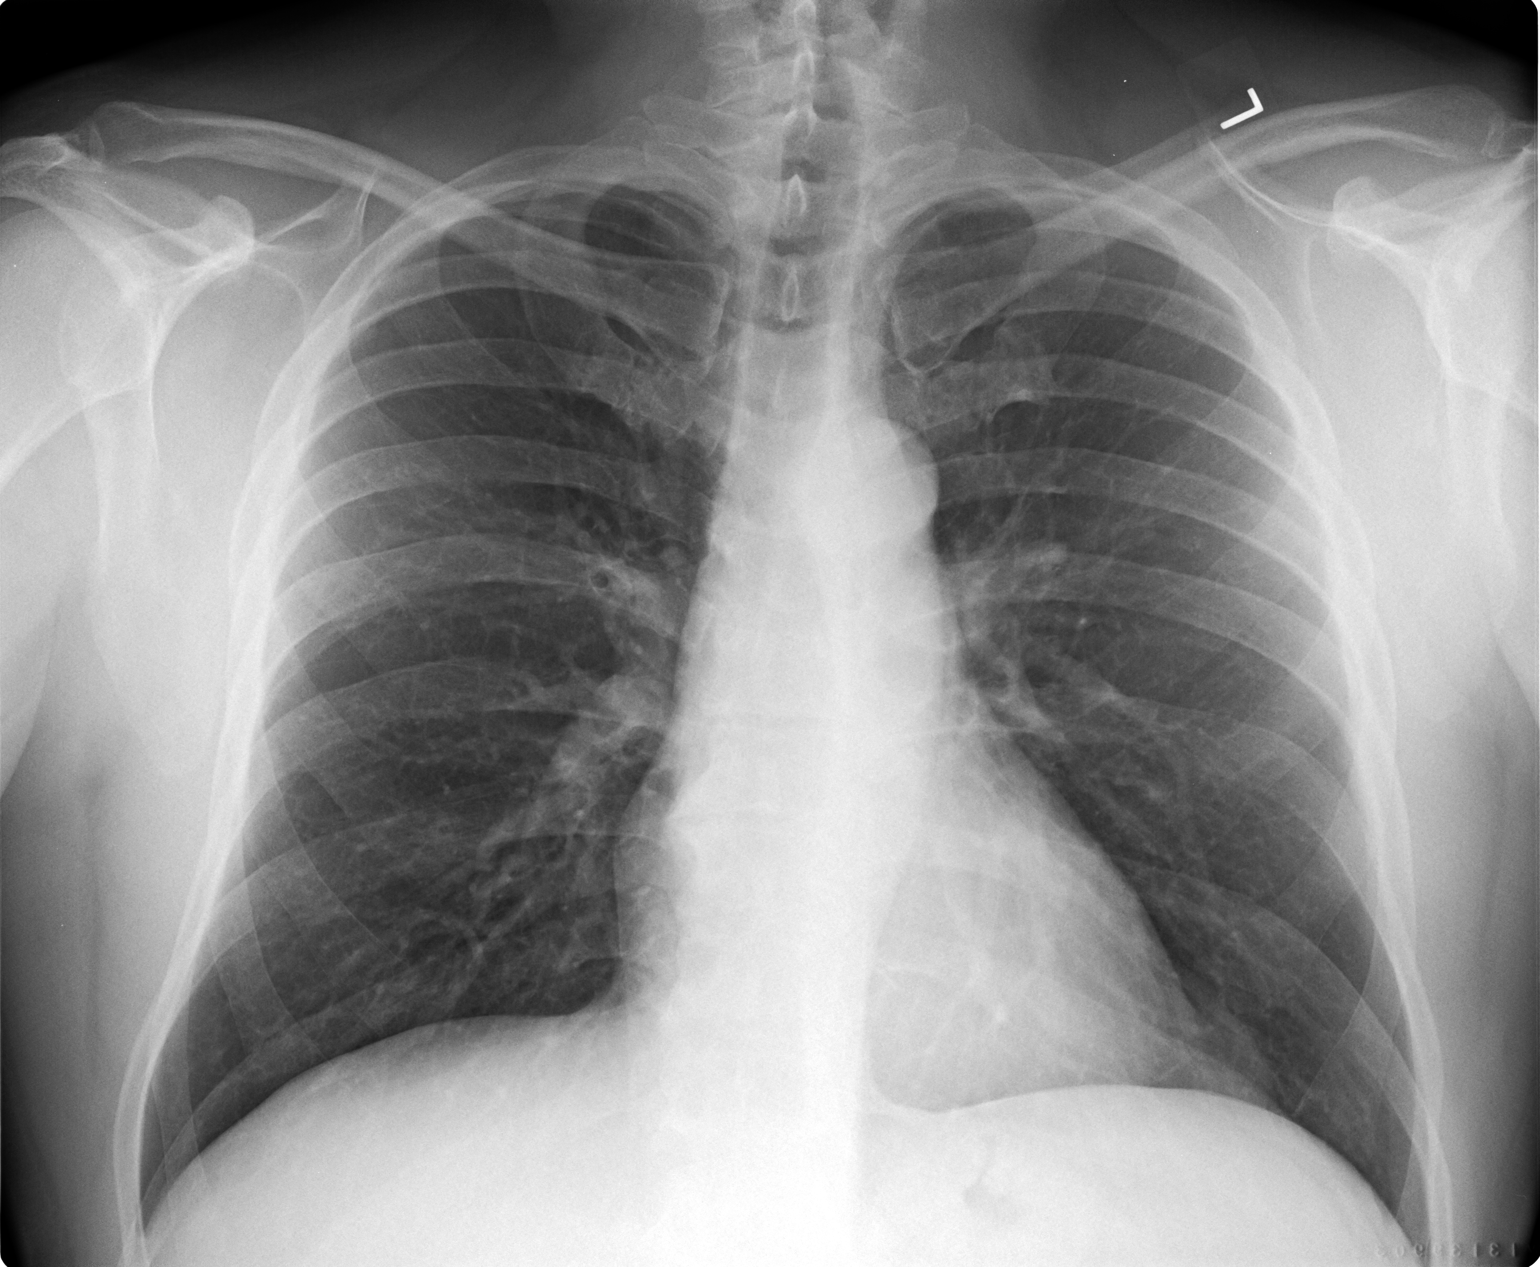

[view not recorded (2 of 2)]
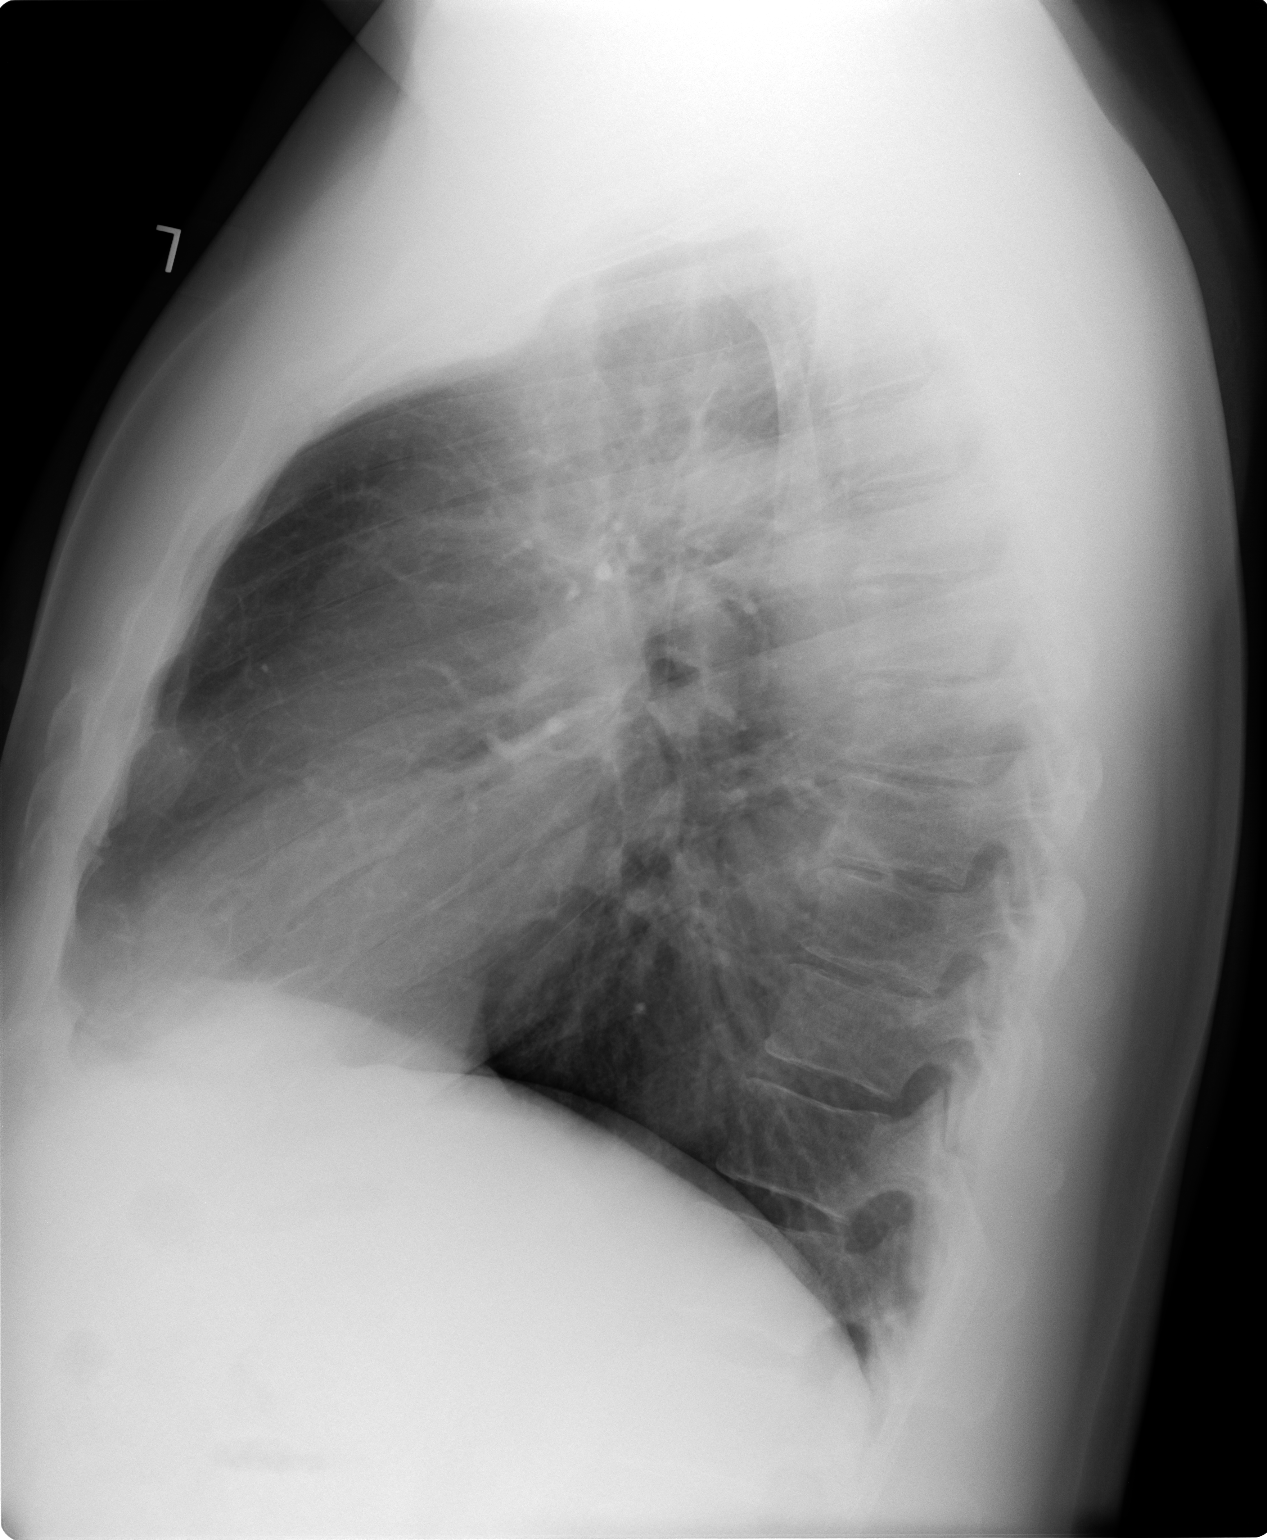

[2 of 2 positions shown; findings below may reference images not displayed]

FINDINGS: The heart size and mediastinal contours are within normal limits.
Both lungs are clear. The visualized skeletal structures are
unremarkable.
IMPRESSION: No active cardiopulmonary disease.

## 2015-07-21 ENCOUNTER — Encounter: Payer: Self-pay | Admitting: Family Medicine

## 2015-07-21 ENCOUNTER — Encounter: Payer: Self-pay | Admitting: *Deleted

## 2015-07-21 ENCOUNTER — Ambulatory Visit (INDEPENDENT_AMBULATORY_CARE_PROVIDER_SITE_OTHER): Payer: BLUE CROSS/BLUE SHIELD | Admitting: Family Medicine

## 2015-07-21 VITALS — BP 132/78 | HR 82 | Temp 96.7°F | Ht 71.0 in | Wt 229.0 lb

## 2015-07-21 DIAGNOSIS — J019 Acute sinusitis, unspecified: Secondary | ICD-10-CM | POA: Diagnosis not present

## 2015-07-21 MED ORDER — AZITHROMYCIN 250 MG PO TABS: ORAL_TABLET | ORAL | Status: DC

## 2015-07-21 MED ORDER — PREDNISONE 20 MG PO TABS: ORAL_TABLET | ORAL | Status: DC

## 2015-07-21 NOTE — Progress Notes (Signed)
BP 132/78 mmHg  Pulse 82  Temp(Src) 96.7 F (35.9 C) (Oral)  Ht  (1.803 m)  Wt 229 lb (103.874 kg)  BMI 31.95 kg/m2   Subjective:    Patient ID: Melvin Roberts, male    DOB: 13-Feb-1961, 55 y.o.   MRN: [id number]  HPI: Melvin Roberts is a 55 y.o. male presenting on 07/21/2015 for Cough and chest congestion   HPI Cough and chest congestion Patient comes in today with cough and chest congestion this been going on for 5 days. He denies any shortness of breath or wheezing or fevers or chills. His cough is worse at night and is productive of yellow-green sputum. He has been using Flonase and an antihistamine and Mucinex and local honey to try and help with the cough and nothing seems to be fully regaining right of it. He does feel like he is slightly improved on these but would like something stronger to help curative. He denies any sick contacts that he knows of. He does tend to get a lot of allergies this time year. His cough is worse at night. He also feels like he is having postnasal drainage.  Relevant past medical, surgical, family and social history reviewed and updated as indicated. Interim medical history since our last visit reviewed. Allergies and medications reviewed and updated.  Review of Systems  Constitutional: Negative for fever and chills.  HENT: Positive for congestion, postnasal drip, rhinorrhea, sinus pressure, sneezing and sore throat. Negative for ear discharge, ear pain and voice change.   Eyes: Negative for pain, discharge, redness and visual disturbance.  Respiratory: Positive for cough. Negative for shortness of breath and wheezing.   Cardiovascular: Negative for chest pain and leg swelling.  Gastrointestinal: Negative for abdominal pain, diarrhea and constipation.  Genitourinary: Negative for difficulty urinating.  Musculoskeletal: Negative for back pain and gait problem.  Skin: Negative for rash.  Neurological: Negative for syncope, light-headedness and  headaches.  All other systems reviewed and are negative.   Per HPI unless specifically indicated above     Medication List       This list is accurate as of: 07/21/15 10:09 AM.  Always use your most recent med list.               azithromycin 250 MG tablet  Commonly known as:  ZITHROMAX  Take 2 the first day and then one each day after.     Cetirizine HCl 10 MG Caps  Take 1 capsule (10 mg total) by mouth daily.     cholecalciferol 1000 units tablet  Commonly known as:  VITAMIN D  Take 1,000 Units by mouth daily.     FISH OIL PO  Take by mouth.     fluticasone 50 MCG/ACT nasal spray  Commonly known as:  FLONASE  One to 2 sprays each nostril daily     lisinopril 10 MG tablet  Commonly known as:  PRINIVIL,ZESTRIL  TAKE 1 TABLET (10 MG TOTAL) BY MOUTH DAILY.     lithium 300 MG tablet  TAKE 1 TABLET 3 TIMES A DAY     metaxalone 800 MG tablet  Commonly known as:  SKELAXIN  TAKE 1 TABLET (800 MG TOTAL) BY MOUTH 3 (THREE) TIMES DAILY.     omeprazole 20 MG capsule  Commonly known as:  PRILOSEC  Take 20 mg by mouth daily.     predniSONE 20 MG tablet  Commonly known as:  DELTASONE  2 po at same time daily for 5  days           Objective:    BP 132/78 mmHg  Pulse 82  Temp(Src) 96.7 F (35.9 C) (Oral)  Ht 5\' 11"  (1.803 m)  Wt 229 lb (103.874 kg)  BMI 31.95 kg/m2  Wt Readings from Last 3 Encounters:  07/21/15 229 lb (103.874 kg)  06/02/15 233 lb (105.688 kg)  05/18/15 232 lb (105.235 kg)    Physical Exam  Constitutional: He is oriented to person, place, and time. He appears well-developed and well-nourished. No distress.  HENT:  Right Ear: Tympanic membrane, external ear and ear canal normal.  Left Ear: Tympanic membrane, external ear and ear canal normal.  Nose: Mucosal edema and rhinorrhea present. No sinus tenderness. No epistaxis. Right sinus exhibits maxillary sinus tenderness. Right sinus exhibits no frontal sinus tenderness. Left sinus exhibits  maxillary sinus tenderness. Left sinus exhibits no frontal sinus tenderness.  Mouth/Throat: Uvula is midline and mucous membranes are normal. Posterior oropharyngeal edema and posterior oropharyngeal erythema present. No oropharyngeal exudate or tonsillar abscesses.  Eyes: Conjunctivae and EOM are normal. Pupils are equal, round, and reactive to light. Right eye exhibits no discharge. No scleral icterus.  Neck: Neck supple. No thyromegaly present.  Cardiovascular: Normal rate, regular rhythm, normal heart sounds and intact distal pulses.   No murmur heard. Pulmonary/Chest: Effort normal and breath sounds normal. No respiratory distress. He has no wheezes. He has no rales.  Abdominal: He exhibits no distension.  Musculoskeletal: Normal range of motion. He exhibits no edema.  Lymphadenopathy:    He has no cervical adenopathy.  Neurological: He is alert and oriented to person, place, and time. Coordination normal.  Skin: Skin is warm and dry. No rash noted. He is not diaphoretic.  Psychiatric: He has a normal mood and affect. His behavior is normal.  Nursing note and vitals reviewed.     Assessment & Plan:   Problem List Items Addressed This Visit    None    Visit Diagnoses    Acute rhinosinusitis    -  Primary    Relevant Medications    azithromycin (ZITHROMAX) 250 MG tablet    predniSONE (DELTASONE) 20 MG tablet        Follow up plan: Return if symptoms worsen or fail to improve.  Counseling provided for all of the vaccine components No orders of the defined types were placed in this encounter.    Arville CareJoshua Jeanmarc Viernes, MD [facility] 07/21/2015, 10:09 AM

## 2015-08-17 ENCOUNTER — Other Ambulatory Visit: Payer: Self-pay | Admitting: Family Medicine

## 2015-08-24 ENCOUNTER — Encounter: Payer: Self-pay | Admitting: *Deleted

## 2015-09-03 ENCOUNTER — Other Ambulatory Visit: Payer: Self-pay | Admitting: Family Medicine

## 2015-09-24 DIAGNOSIS — S83242A Other tear of medial meniscus, current injury, left knee, initial encounter: Secondary | ICD-10-CM | POA: Diagnosis not present

## 2015-11-10 ENCOUNTER — Ambulatory Visit (INDEPENDENT_AMBULATORY_CARE_PROVIDER_SITE_OTHER): Payer: BLUE CROSS/BLUE SHIELD | Admitting: Family Medicine

## 2015-11-10 ENCOUNTER — Encounter: Payer: Self-pay | Admitting: Family Medicine

## 2015-11-10 VITALS — BP 106/69 | HR 76 | Temp 97.7°F | Ht 71.0 in | Wt 231.0 lb

## 2015-11-10 DIAGNOSIS — N400 Enlarged prostate without lower urinary tract symptoms: Secondary | ICD-10-CM

## 2015-11-10 DIAGNOSIS — K219 Gastro-esophageal reflux disease without esophagitis: Secondary | ICD-10-CM

## 2015-11-10 DIAGNOSIS — F3170 Bipolar disorder, currently in remission, most recent episode unspecified: Secondary | ICD-10-CM

## 2015-11-10 DIAGNOSIS — E559 Vitamin D deficiency, unspecified: Secondary | ICD-10-CM

## 2015-11-10 DIAGNOSIS — Z0000 Encounter for general adult medical examination without abnormal findings: Secondary | ICD-10-CM

## 2015-11-10 DIAGNOSIS — E785 Hyperlipidemia, unspecified: Secondary | ICD-10-CM

## 2015-11-10 DIAGNOSIS — Z1211 Encounter for screening for malignant neoplasm of colon: Secondary | ICD-10-CM

## 2015-11-10 DIAGNOSIS — I10 Essential (primary) hypertension: Secondary | ICD-10-CM

## 2015-11-10 NOTE — Progress Notes (Signed)
Subjective:    Patient ID: Melvin Roberts, male    DOB: [birth year], 55 y.o.   MRN: [id number]  HPI Pt here for annual physical and follow up and management of chronic medical problems which includes hyperlipidemia and bipolar. He is taking medications regularly.The patient is doing well overall. He complains of fatigue. This is especially been worse [place] and it this is been worse this summer. He's been drinking plenty of fluids. He denies any chest pain shortness of breath chest pressure trouble swallowing heartburn indigestion nausea vomiting diarrhea blood in the stool or black tarry bowel movements. He is passing his water without problems. His family history is positive for breast cancer and lung cancer. He has 2 sisters and he is in the middle and both of his sisters are in good health. He is up-to-date on his colonoscopies. He denies any problems with his sex life.    Patient Active Problem List   Diagnosis Date Noted  . Hypertension 09/16/2013  . Bipolar disorder ([place]) 03/11/2013  . Allergic rhinitis 03/11/2013  . BPH (benign prostatic hyperplasia) 03/11/2013  . Hyperlipidemia 05/29/2007  . DEPRESSION, CHRONIC 05/29/2007  . SINUSITIS, RECURRENT 05/29/2007  . ESOPHAGEAL STRICTURE 05/29/2007  . GERD 05/29/2007  . HIATAL HERNIA 05/29/2007  . LOW BACK PAIN, CHRONIC 05/29/2007   Outpatient Encounter Prescriptions as of 11/10/2015  Medication Sig  . cholecalciferol (VITAMIN D) 1000 UNITS tablet Take 1,000 Units by mouth daily.  . fluticasone (FLONASE) 50 MCG/ACT nasal spray One to 2 sprays each nostril daily  . lisinopril (PRINIVIL,ZESTRIL) 10 MG tablet TAKE 1 TABLET (10 MG TOTAL) BY MOUTH DAILY.  Marland Kitchen lithium 300 MG tablet TAKE 1 TABLET 3 TIMES A DAY  . metaxalone (SKELAXIN) 800 MG tablet TAKE 1 TABLET (800 MG TOTAL) BY MOUTH 3 (THREE) TIMES DAILY.  Marland Kitchen Omega-3 Fatty Acids (FISH OIL PO) Take by mouth.  Marland Kitchen omeprazole (PRILOSEC) 20 MG capsule Take 20  mg by mouth daily.  . [DISCONTINUED] azithromycin (ZITHROMAX) 250 MG tablet Take 2 the first day and then one each day after.  . [DISCONTINUED] Cetirizine HCl 10 MG CAPS Take 1 capsule (10 mg total) by mouth daily.  . [DISCONTINUED] predniSONE (DELTASONE) 20 MG tablet 2 po at same time daily for 5 days   No facility-administered encounter medications on file as of 11/10/2015.        Review of Systems  Constitutional: Positive for fatigue (heat and overtime at work).  HENT: Negative.   Eyes: Negative.   Respiratory: Negative.   Cardiovascular: Negative.   Gastrointestinal: Negative.   Endocrine: Negative.   Genitourinary: Negative.   Musculoskeletal: Negative.   Skin: Negative.   Allergic/Immunologic: Negative.   Neurological: Negative.   Hematological: Negative.   Psychiatric/Behavioral: Negative.        Objective:   Physical Exam  Constitutional: He is oriented to person, place, and time. He appears well-developed and well-nourished. No distress.  Patient is alert and pleasant  HENT:  Head: Normocephalic and atraumatic.  Right Ear: External ear normal.  Left Ear: External ear normal.  Nose: Nose normal.  Mouth/Throat: Oropharynx is clear and moist. No oropharyngeal exudate.  Eyes: Conjunctivae and EOM are normal. Pupils are equal, round, and reactive to light. Right eye exhibits no discharge. Left eye exhibits no discharge. No scleral icterus.  Neck: Normal range of motion. Neck supple. No thyromegaly present.  No bruits or thyromegaly  Cardiovascular: Normal rate, regular rhythm, normal heart sounds and  intact distal pulses.   No murmur heard. The heart has a regular rate and rhythm at 72/m  Pulmonary/Chest: Effort normal and breath sounds normal. No respiratory distress. He has no wheezes. He has no rales. He exhibits no tenderness.  Clear anteriorly and posteriorly without axillary adenopathy  Abdominal: Soft. Bowel sounds are normal. He exhibits no mass. There is no  tenderness. There is no rebound and no guarding.  No liver or spleen enlargement no masses and normal bowel sounds  Genitourinary: Rectum normal and penis normal.  Genitourinary Comments: The prostate was enlarged but soft and smooth. There are no rectal masses. There are no inguinal hernias. External genitalia were within normal limits.  Musculoskeletal: Normal range of motion. He exhibits no edema.  Lymphadenopathy:    He has no cervical adenopathy.  Neurological: He is alert and oriented to person, place, and time. He has normal reflexes. No cranial nerve deficit.  Skin: Skin is warm and dry. No rash noted.  Psychiatric: He has a normal mood and affect. His behavior is normal. Judgment and thought content normal.  Nursing note and vitals reviewed.  BP 106/69 (BP Location: Left Arm)   Pulse 76   Temp 97.7 F (36.5 C) (Oral)   Ht '5\' 11"'  (1.803 m)   Wt 231 lb (104.8 kg)   BMI 32.22 kg/m         Assessment & Plan:  1. Annual physical exam -Stay well hydrated. We will call her lab work results as soon as these results become available. - Urinalysis, Complete; Future - BMP8+EGFR; Future - CBC with Differential/Platelet; Future - Hepatic function panel; Future - PSA, total and free; Future - Lipid panel; Future - VITAMIN D 25 Hydroxy (Vit-D Deficiency, Fractures); Future - Lithium level; Future  2. BPH (benign prostatic hyperplasia) -The prostate is enlarged but soft and smooth the patient is having no symptoms with this. - Urinalysis, Complete; Future - CBC with Differential/Platelet; Future - PSA, total and free; Future  3. Gastroesophageal reflux disease, esophagitis presence not specified -The patient is having no symptoms with his reflux. - CBC with Differential/Platelet; Future - Hepatic function panel; Future  4. Hyperlipidemia -Continue with aggressive therapeutic lifestyle changes and omega-3 fatty acids. - CBC with Differential/Platelet; Future - Lipid panel;  Future  5. Bipolar affective disorder in remission ([place]) -Continue with lithium pending results of blood levels - CBC with Differential/Platelet; Future - Lithium level; Future  6. Vitamin D deficiency -Continue current treatment pending results of lab work - CBC with Differential/Platelet; Future - VITAMIN D 25 Hydroxy (Vit-D Deficiency, Fractures); Future  7. Essential hypertension, benign -The blood pressure is good today and he will continue with current treatment - BMP8+EGFR; Future - CBC with Differential/Platelet; Future - Hepatic function panel; Future  8. BPH  Patient Instructions  Continue current medications. Continue good therapeutic lifestyle changes which include good diet and exercise. Fall precautions discussed with patient. If an FOBT was given today- please return it to our front desk. If you are over 76 years old - you may need Prevnar 29 or the adult Pneumonia vaccine.   After your visit with [place] today you will receive a survey in the mail or online from [employer] regarding your care with [place]. Please take a moment to fill this out. Your feedback is very important to [place] as you can help [place] better understand your patient needs as well as improve your experience and satisfaction. WE CARE ABOUT YOU!!!   Return to clinic for  fasting blood work In the heat of the summer continue to drink plenty of fluids and stay well hydrated We will call with lab work results as soon as these become available    Arrie Senate MD

## 2015-11-10 NOTE — Patient Instructions (Addendum)
Continue current medications. Continue good therapeutic lifestyle changes which include good diet and exercise. Fall precautions discussed with patient. If an FOBT was given today- please return it to our front desk. If you are over 55 years old - you may need Prevnar 13 or the adult Pneumonia vaccine.   After your visit with [place] today you will receive a survey in the mail or online from [employer] regarding your care with [place]. Please take a moment to fill this out. Your feedback is very important to [place] as you can help [place] better understand your patient needs as well as improve your experience and satisfaction. WE CARE ABOUT YOU!!!   Return to clinic for fasting blood work In the heat of the summer continue to drink plenty of fluids and stay well hydrated We will call with lab work results as soon as these become available

## 2015-11-13 ENCOUNTER — Ambulatory Visit: Payer: Self-pay | Admitting: Family Medicine

## 2015-11-14 ENCOUNTER — Other Ambulatory Visit: Payer: Self-pay | Admitting: Family Medicine

## 2015-11-16 DIAGNOSIS — K219 Gastro-esophageal reflux disease without esophagitis: Secondary | ICD-10-CM | POA: Diagnosis not present

## 2015-11-16 DIAGNOSIS — N400 Enlarged prostate without lower urinary tract symptoms: Secondary | ICD-10-CM | POA: Diagnosis not present

## 2015-11-16 DIAGNOSIS — I10 Essential (primary) hypertension: Secondary | ICD-10-CM | POA: Diagnosis not present

## 2015-11-16 DIAGNOSIS — E559 Vitamin D deficiency, unspecified: Secondary | ICD-10-CM | POA: Diagnosis not present

## 2015-11-16 DIAGNOSIS — Z0000 Encounter for general adult medical examination without abnormal findings: Secondary | ICD-10-CM | POA: Diagnosis not present

## 2015-11-16 DIAGNOSIS — F3170 Bipolar disorder, currently in remission, most recent episode unspecified: Secondary | ICD-10-CM | POA: Diagnosis not present

## 2015-11-16 DIAGNOSIS — E785 Hyperlipidemia, unspecified: Secondary | ICD-10-CM | POA: Diagnosis not present

## 2015-11-16 LAB — URINALYSIS, COMPLETE
Bilirubin, UA: NEGATIVE
GLUCOSE, UA: NEGATIVE
KETONES UA: NEGATIVE
LEUKOCYTES UA: NEGATIVE
Nitrite, UA: NEGATIVE
Protein, UA: NEGATIVE
RBC, UA: NEGATIVE
SPEC GRAV UA: 1.015 (ref 1.005–1.030)
Urobilinogen, Ur: 0.2 mg/dL (ref 0.2–1.0)
pH, UA: 7 (ref 5.0–7.5)

## 2015-11-16 LAB — MICROSCOPIC EXAMINATION
Bacteria, UA: NONE SEEN
EPITHELIAL CELLS (NON RENAL): NONE SEEN /HPF (ref 0–10)
RBC, UA: NONE SEEN /hpf (ref 0–?)
WBC, UA: NONE SEEN /hpf (ref 0–?)

## 2015-11-16 NOTE — Addendum Note (Signed)
Addended by: Prescott Gum on: 11/16/2015 08:40 AM   Modules accepted: Orders

## 2015-11-16 NOTE — Addendum Note (Signed)
Addended by: Magdalene River on: 11/16/2015 12:36 PM   Modules accepted: Orders

## 2015-11-16 NOTE — Addendum Note (Signed)
Addended by: Prescott Gum on: 11/16/2015 03:09 PM   Modules accepted: Orders

## 2015-11-17 LAB — LIPID PANEL
CHOLESTEROL TOTAL: 187 mg/dL (ref 100–199)
Chol/HDL Ratio: 5.3 ratio units — ABNORMAL HIGH (ref 0.0–5.0)
HDL: 35 mg/dL — AB (ref >39)
LDL Calculated: 94 mg/dL (ref 0–99)
TRIGLYCERIDES: 289 mg/dL — AB (ref 0–149)
VLDL CHOLESTEROL CAL: 58 mg/dL — AB (ref 5–40)

## 2015-11-17 LAB — CBC WITH DIFFERENTIAL/PLATELET
BASOS ABS: 0 10*3/uL (ref 0.0–0.2)
Basos: 0 %
EOS (ABSOLUTE): 0.1 10*3/uL (ref 0.0–0.4)
EOS: 2 %
HEMATOCRIT: 38.8 % (ref 37.5–51.0)
Hemoglobin: 12.8 g/dL (ref 12.6–17.7)
IMMATURE GRANULOCYTES: 0 %
Immature Grans (Abs): 0 10*3/uL (ref 0.0–0.1)
Lymphocytes Absolute: 1.5 10*3/uL (ref 0.7–3.1)
Lymphs: 21 %
MCH: 29 pg (ref 26.6–33.0)
MCHC: 33 g/dL (ref 31.5–35.7)
MCV: 88 fL (ref 79–97)
MONOS ABS: 0.3 10*3/uL (ref 0.1–0.9)
Monocytes: 5 %
NEUTROS PCT: 72 %
Neutrophils Absolute: 5.1 10*3/uL (ref 1.4–7.0)
PLATELETS: 246 10*3/uL (ref 150–379)
RBC: 4.42 x10E6/uL (ref 4.14–5.80)
RDW: 14.7 % (ref 12.3–15.4)
WBC: 7.1 10*3/uL (ref 3.4–10.8)

## 2015-11-17 LAB — BMP8+EGFR
BUN/Creatinine Ratio: 12 (ref 9–20)
BUN: 13 mg/dL (ref 6–24)
CHLORIDE: 104 mmol/L (ref 96–106)
CO2: 21 mmol/L (ref 18–29)
CREATININE: 1.06 mg/dL (ref 0.76–1.27)
Calcium: 9.7 mg/dL (ref 8.7–10.2)
GFR calc Af Amer: 92 mL/min/{1.73_m2} (ref >59)
GFR calc non Af Amer: 79 mL/min/{1.73_m2} (ref >59)
GLUCOSE: 96 mg/dL (ref 65–99)
Potassium: 4.9 mmol/L (ref 3.5–5.2)
SODIUM: 138 mmol/L (ref 134–144)

## 2015-11-17 LAB — HEPATIC FUNCTION PANEL
ALBUMIN: 4.3 g/dL (ref 3.5–5.5)
ALT: 39 IU/L (ref 0–44)
AST: 42 IU/L — ABNORMAL HIGH (ref 0–40)
Alkaline Phosphatase: 64 IU/L (ref 39–117)
BILIRUBIN TOTAL: 0.4 mg/dL (ref 0.0–1.2)
Bilirubin, Direct: 0.11 mg/dL (ref 0.00–0.40)
TOTAL PROTEIN: 6.7 g/dL (ref 6.0–8.5)

## 2015-11-17 LAB — FECAL OCCULT BLOOD, IMMUNOCHEMICAL: Fecal Occult Bld: NEGATIVE

## 2015-11-17 LAB — LITHIUM LEVEL: LITHIUM LVL: 0.8 mmol/L (ref 0.6–1.2)

## 2015-11-17 LAB — VITAMIN D 25 HYDROXY (VIT D DEFICIENCY, FRACTURES): Vit D, 25-Hydroxy: 46.7 ng/mL (ref 30.0–100.0)

## 2015-11-17 LAB — PSA, TOTAL AND FREE
PSA FREE PCT: 20 %
PSA FREE: 0.36 ng/mL
Prostate Specific Ag, Serum: 1.8 ng/mL (ref 0.0–4.0)

## 2015-11-23 ENCOUNTER — Telehealth: Payer: Self-pay | Admitting: Family Medicine

## 2015-11-23 NOTE — Telephone Encounter (Signed)
Pt aware.

## 2015-12-03 ENCOUNTER — Ambulatory Visit (INDEPENDENT_AMBULATORY_CARE_PROVIDER_SITE_OTHER): Payer: BLUE CROSS/BLUE SHIELD | Admitting: Family Medicine

## 2015-12-03 ENCOUNTER — Encounter: Payer: Self-pay | Admitting: Family Medicine

## 2015-12-03 VITALS — BP 122/74 | HR 65 | Temp 97.1°F | Ht 71.0 in | Wt 227.4 lb

## 2015-12-03 DIAGNOSIS — R040 Epistaxis: Secondary | ICD-10-CM

## 2015-12-03 NOTE — Progress Notes (Signed)
   Subjective:    Patient ID: Melvin Roberts, male    DOB: 12/28/1960, 55 y.o.   MRN: [id number]  HPI 55 year old gentleman with a two-week history of nosebleeds. Usually left-sided and bleeding is always anterior by history or there is no blood running down his throat if he is upright. He works with [employer] and work is strenuous at times. He also has some sinus congestion but the bleeding is not mucus blood-tinged but just bleeding.  Patient Active Problem List   Diagnosis Date Noted  . Hypertension 09/16/2013  . Bipolar disorder (HCC) 03/11/2013  . Allergic rhinitis 03/11/2013  . BPH (benign prostatic hyperplasia) 03/11/2013  . Hyperlipidemia 05/29/2007  . DEPRESSION, CHRONIC 05/29/2007  . SINUSITIS, RECURRENT 05/29/2007  . ESOPHAGEAL STRICTURE 05/29/2007  . GERD 05/29/2007  . HIATAL HERNIA 05/29/2007  . LOW BACK PAIN, CHRONIC 05/29/2007   Outpatient Encounter Prescriptions as of 12/03/2015  Medication Sig  . cholecalciferol (VITAMIN D) 1000 UNITS tablet Take 1,000 Units by mouth daily.  . fluticasone (FLONASE) 50 MCG/ACT nasal spray One to 2 sprays each nostril daily  . lisinopril (PRINIVIL,ZESTRIL) 10 MG tablet TAKE 1 TABLET (10 MG TOTAL) BY MOUTH DAILY.  Marland Kitchen. lithium 300 MG tablet TAKE 1 TABLET 3 TIMES A DAY  . metaxalone (SKELAXIN) 800 MG tablet TAKE 1 TABLET (800 MG TOTAL) BY MOUTH 3 (THREE) TIMES DAILY.  Marland Kitchen. Omega-3 Fatty Acids (FISH OIL PO) Take by mouth.  Marland Kitchen. omeprazole (PRILOSEC) 20 MG capsule Take 20 mg by mouth daily.   No facility-administered encounter medications on file as of 12/03/2015.       Review of Systems  Constitutional: Negative.   HENT: Positive for nosebleeds.   Respiratory: Negative.   Cardiovascular: Negative.   Hematological: Negative.        Objective:   Physical Exam  Constitutional: He appears well-developed and well-nourished.  HENT:  Small area and left nostril with a scab that is likely source of bleeding when the scab gets knocked  off and it rebleeds.  Cardiovascular: Normal rate.   Pulmonary/Chest: Effort normal.  Neurological: He is alert.   BP 122/74 (BP Location: Right Arm, Patient Position: Sitting, Cuff Size: Normal)   Pulse 65   Temp 97.1 F (36.2 C) (Oral)   Ht 5\' 11"  (1.803 m)   Wt 227 lb 6.4 oz (103.1 kg)   BMI 31.72 kg/m         Assessment & Plan:  1. Epistaxis No active bleeding today but scab suggested there was an area that bled. I think if he keeps is moist with some Polysporin or Neosporin and avoids strenuous activities for 1 week but the problem will resolve. If it does not would like him to see ENT  Frederica KusterStephen M Abron Neddo MD

## 2016-01-11 DIAGNOSIS — S83242A Other tear of medial meniscus, current injury, left knee, initial encounter: Secondary | ICD-10-CM | POA: Diagnosis not present

## 2016-02-24 ENCOUNTER — Encounter: Payer: Self-pay | Admitting: Nurse Practitioner

## 2016-02-24 ENCOUNTER — Ambulatory Visit (INDEPENDENT_AMBULATORY_CARE_PROVIDER_SITE_OTHER): Payer: BLUE CROSS/BLUE SHIELD | Admitting: Nurse Practitioner

## 2016-02-24 VITALS — BP 119/72 | HR 68 | Temp 97.3°F | Ht 71.0 in | Wt 233.2 lb

## 2016-02-24 DIAGNOSIS — J0101 Acute recurrent maxillary sinusitis: Secondary | ICD-10-CM | POA: Diagnosis not present

## 2016-02-24 MED ORDER — METHYLPREDNISOLONE ACETATE 80 MG/ML IJ SUSP
80.0000 mg | Freq: Once | INTRAMUSCULAR | Status: AC
  Administered 2016-02-24: 80 mg via INTRAMUSCULAR

## 2016-02-24 NOTE — Patient Instructions (Signed)

## 2016-02-24 NOTE — Progress Notes (Signed)
Subjective:     Melvin EldersMike Roberts is a 55 y.o. male who presents for evaluation of sinus pain. Symptoms include: congestion, facial pain, headaches, nasal congestion, sinus pressure and sore throat. Onset of symptoms was 4 days ago. Symptoms have been unchanged since that time. Past history is significant for no history of pneumonia or bronchitis. Patient is a non-smoker. His dentist called in rx  Of zithromax 4 dayas ago but he is no better. The following portions of the patient's history were reviewed and updated as appropriate: allergies, current medications, past family history, past medical history, past social history, past surgical history and problem list.  Review of Systems Pertinent items noted in HPI and remainder of comprehensive ROS otherwise negative.   Objective:    BP 119/72   Pulse 68   Temp 97.3 F (36.3 C) (Oral)   Ht 5\' 11"  (1.803 m)   Wt 233 lb 3.2 oz (105.8 kg)   BMI 32.52 kg/m  General appearance: alert and cooperative Eyes: conjunctivae/corneas clear. PERRL, EOM's intact. Fundi benign. Ears: normal TM's and external ear canals both ears Nose: clear discharge, moderate congestion, turbinates red, sinus tenderness bilateral Throat: lips, mucosa, and tongue normal; teeth and gums normal Neck: no adenopathy, no carotid bruit, no JVD, supple, symmetrical, trachea midline and thyroid not enlarged, symmetric, no tenderness/mass/nodules Lungs: clear to auscultation bilaterally and dry deep cough Heart: regular rate and rhythm, S1, S2 normal, no murmur, click, rub or gallop    Assessment:    Acute bacterial sinusitis.    Plan:   1. Take meds as prescribed 2. Use a cool mist humidifier especially during the winter months and when heat has been humid. 3. Use saline nose sprays frequently 4. Saline irrigations of the nose can be very helpful if done frequently.  * 4X daily for 1 week*  * Use of a nettie pot can be helpful with this. Follow directions with this* 5.  Drink plenty of fluids 6. Keep thermostat turn down low 7.For any cough or congestion  Use plain Mucinex- regular strength or max strength is fine   * Children- consult with Pharmacist for dosing 8. For fever or aces or pains- take tylenol or ibuprofen appropriate for age and weight.  * for fevers greater than 101 orally you may alternate ibuprofen and tylenol every  3 hours.   Meds ordered this encounter  Medications  . azithromycin (ZITHROMAX) 1 g powder    Sig: Take 1 g by mouth once.  . methylPREDNISolone acetate (DEPO-MEDROL) injection 80 mg   Finish zithromax previuously ordered RTO prn  Melvin Daphine DeutscherMartin, FNP

## 2016-03-21 ENCOUNTER — Other Ambulatory Visit: Payer: Self-pay | Admitting: Family Medicine

## 2016-04-19 ENCOUNTER — Ambulatory Visit (INDEPENDENT_AMBULATORY_CARE_PROVIDER_SITE_OTHER): Payer: BLUE CROSS/BLUE SHIELD | Admitting: Family Medicine

## 2016-04-19 ENCOUNTER — Encounter: Payer: Self-pay | Admitting: Family Medicine

## 2016-04-19 VITALS — BP 117/78 | HR 75 | Temp 97.7°F | Ht 71.0 in | Wt 230.0 lb

## 2016-04-19 DIAGNOSIS — J019 Acute sinusitis, unspecified: Secondary | ICD-10-CM | POA: Diagnosis not present

## 2016-04-19 MED ORDER — AMOXICILLIN-POT CLAVULANATE 875-125 MG PO TABS: 1.0000 | ORAL_TABLET | Freq: Two times a day (BID) | ORAL | 0 refills | Status: DC

## 2016-04-19 NOTE — Progress Notes (Signed)
Subjective:    Patient ID: Melvin Roberts, male    DOB: 02/03/1961, 56 y.o.   MRN: [id number]  HPI  Patient here today for sinus pressure and teeth hurting. He has had a recent cold, but the cough and congestion is better today.This is been going on for several days. The headaches have been frontal and nature    Patient Active Problem List   Diagnosis Date Noted  . Hypertension 09/16/2013  . Bipolar disorder (HCC) 03/11/2013  . Allergic rhinitis 03/11/2013  . BPH (benign prostatic hyperplasia) 03/11/2013  . Hyperlipidemia 05/29/2007  . DEPRESSION, CHRONIC 05/29/2007  . SINUSITIS, RECURRENT 05/29/2007  . ESOPHAGEAL STRICTURE 05/29/2007  . GERD 05/29/2007  . HIATAL HERNIA 05/29/2007  . LOW BACK PAIN, CHRONIC 05/29/2007   Outpatient Encounter Prescriptions as of 04/19/2016  Medication Sig  . azithromycin (ZITHROMAX) 1 g powder Take 1 g by mouth once.  . cholecalciferol (VITAMIN D) 1000 UNITS tablet Take 1,000 Units by mouth daily.  . fluticasone (FLONASE) 50 MCG/ACT nasal spray One to 2 sprays each nostril daily  . lisinopril (PRINIVIL,ZESTRIL) 10 MG tablet TAKE 1 TABLET (10 MG TOTAL) BY MOUTH DAILY.  Marland Kitchen. lithium 300 MG tablet TAKE 1 TABLET 3 TIMES A DAY  . metaxalone (SKELAXIN) 800 MG tablet TAKE 1 TABLET (800 MG TOTAL) BY MOUTH 3 (THREE) TIMES DAILY.  Marland Kitchen. Omega-3 Fatty Acids (FISH OIL PO) Take by mouth.  Marland Kitchen. omeprazole (PRILOSEC) 20 MG capsule Take 20 mg by mouth daily.   No facility-administered encounter medications on file as of 04/19/2016.      Review of Systems  Constitutional: Negative.   HENT: Positive for sinus pressure (teeth hurt).   Eyes: Negative.   Respiratory: Negative.   Cardiovascular: Negative.   Gastrointestinal: Negative.   Endocrine: Negative.   Genitourinary: Negative.   Musculoskeletal: Negative.   Skin: Negative.   Allergic/Immunologic: Negative.   Neurological: Negative.   Hematological: Negative.   Psychiatric/Behavioral: Negative.          Objective:   Physical Exam  Constitutional: He is oriented to person, place, and time. He appears well-developed and well-nourished. No distress.  HENT:  Head: Normocephalic and atraumatic.  Right Ear: External ear normal.  Left Ear: External ear normal.  Mouth/Throat: Oropharynx is clear and moist. No oropharyngeal exudate.  Nasal congestion and turbinate congestion bilaterally there is ethmoid and maxillary sinus tenderness to palpation  Eyes: Conjunctivae and EOM are normal. Pupils are equal, round, and reactive to light. Right eye exhibits no discharge. Left eye exhibits no discharge. No scleral icterus.  Neck: Normal range of motion. Neck supple. No thyromegaly present.  Cardiovascular: Normal rate, regular rhythm and normal heart sounds.  Exam reveals no friction rub.   No murmur heard. Pulmonary/Chest: Effort normal and breath sounds normal. No respiratory distress. He has no wheezes. He has no rales.  Dry cough with minimal congestion with coughing  Musculoskeletal: Normal range of motion. He exhibits no edema.  Lymphadenopathy:    He has no cervical adenopathy.  Neurological: He is alert and oriented to person, place, and time. No cranial nerve deficit.  Skin: Skin is warm and dry. No rash noted.  Psychiatric: He has a normal mood and affect. His behavior is normal. Judgment and thought content normal.  Nursing note and vitals reviewed.  BP 117/78 (BP Location: Left Arm)   Pulse 75   Temp 97.7 F (36.5 C) (Oral)   Ht 5\' 11"  (1.803 m)   Wt 230 lb (104.3 kg)  BMI 32.08 kg/m         Assessment & Plan:  1. Acute rhinosinusitis -Take Augmentin as directed with food until completed -Drink plenty of fluids, use nasal saline and continue with Mucinex -Use cool mist humidification and no overhead fan  Meds ordered this encounter  Medications  . amoxicillin-clavulanate (AUGMENTIN) 875-125 MG tablet    Sig: Take 1 tablet by mouth 2 (two) times daily.    Dispense:  20  tablet    Refill:  0   Patient Instructions  Continue to use Mucinex Use nasal saline frequently by spring in each nostril 45 Times daily Drink plenty of fluids  Keep the house as cool as possible and do not use any overhead fans Take Augmentin twice daily with food until completed  Nyra Capes MD

## 2016-04-19 NOTE — Patient Instructions (Signed)
Continue to use Mucinex Use nasal saline frequently by spring in each nostril 45 Times daily Drink plenty of fluids  Keep the house as cool as possible and do not use any overhead fans Take Augmentin twice daily with food until completed

## 2016-04-25 ENCOUNTER — Other Ambulatory Visit: Payer: Self-pay | Admitting: Family Medicine

## 2016-05-16 ENCOUNTER — Ambulatory Visit: Payer: Self-pay | Admitting: Family Medicine

## 2016-05-27 ENCOUNTER — Encounter: Payer: Self-pay | Admitting: Family Medicine

## 2016-05-27 ENCOUNTER — Ambulatory Visit (INDEPENDENT_AMBULATORY_CARE_PROVIDER_SITE_OTHER): Payer: BLUE CROSS/BLUE SHIELD | Admitting: Family Medicine

## 2016-05-27 VITALS — BP 119/77 | HR 76 | Temp 97.9°F | Ht 71.0 in | Wt 227.4 lb

## 2016-05-27 DIAGNOSIS — A084 Viral intestinal infection, unspecified: Secondary | ICD-10-CM

## 2016-05-27 MED ORDER — ONDANSETRON 4 MG PO TBDP: 4.0000 mg | ORAL_TABLET | Freq: Three times a day (TID) | ORAL | 0 refills | Status: DC | PRN

## 2016-05-27 NOTE — Progress Notes (Signed)
BP 119/77   Pulse 76   Temp 97.9 F (36.6 C) (Oral)   Ht 5\' 11"  (1.803 m)   Wt 227 lb 6.4 oz (103.1 kg)   BMI 31.72 kg/m    Subjective:    Patient ID: Melvin Roberts, male    DOB: 02/27/1961, 56 y.o.   MRN: [id number]  HPI: Melvin Roberts is a 56 y.o. male presenting on 05/27/2016 for Nausea (started Tuesday night - Wednesday, feels like its coming back today); Emesis; Diarrhea; and Chills   HPI Nausea and vomiting and diarrhea Patient is coming in for nausea and vomiting diarrhea that has been going on for the past 3 days. He felt like he was improving and the vomiting was gone and now the diarrhea has worsened over the past day. He also has some nausea coming back today. He denies any vomiting blood or any diarrhea with blood. He does have some epigastric abdominal discomfort and says it is low-grade and does not radiate anywhere else. He was the first when they got it and now there are other members of his family that has illness now including his wife and one of his sons.  Relevant past medical, surgical, family and social history reviewed and updated as indicated. Interim medical history since our last visit reviewed. Allergies and medications reviewed and updated.  Review of Systems  Constitutional: Negative for chills and fever.  Respiratory: Negative for shortness of breath and wheezing.   Cardiovascular: Negative for chest pain and leg swelling.  Gastrointestinal: Positive for abdominal pain, diarrhea, nausea and vomiting. Negative for blood in stool and constipation.  Musculoskeletal: Negative for back pain and gait problem.  Skin: Negative for rash.  All other systems reviewed and are negative.   Per HPI unless specifically indicated above   Allergies as of 05/27/2016      Reactions   Lipitor [atorvastatin]    Niaspan [niacin Er]       Medication List       Accurate as of 05/27/16  4:36 PM. Always use your most recent med list.          cholecalciferol 1000  units tablet Commonly known as:  VITAMIN D Take 1,000 Units by mouth daily.   FISH OIL PO Take by mouth.   fluticasone 50 MCG/ACT nasal spray Commonly known as:  FLONASE One to 2 sprays each nostril daily   lisinopril 10 MG tablet Commonly known as:  PRINIVIL,ZESTRIL TAKE 1 TABLET (10 MG TOTAL) BY MOUTH DAILY.   lithium 300 MG tablet TAKE 1 TABLET 3 TIMES A DAY   metaxalone 800 MG tablet Commonly known as:  SKELAXIN TAKE 1 TABLET (800 MG TOTAL) BY MOUTH 3 (THREE) TIMES DAILY.   omeprazole 20 MG capsule Commonly known as:  PRILOSEC Take 20 mg by mouth daily.   ondansetron 4 MG disintegrating tablet Commonly known as:  ZOFRAN ODT Take 1 tablet (4 mg total) by mouth every 8 (eight) hours as needed for nausea or vomiting.          Objective:    BP 119/77   Pulse 76   Temp 97.9 F (36.6 C) (Oral)   Ht 5\' 11"  (1.803 m)   Wt 227 lb 6.4 oz (103.1 kg)   BMI 31.72 kg/m   Wt Readings from Last 3 Encounters:  05/27/16 227 lb 6.4 oz (103.1 kg)  04/19/16 230 lb (104.3 kg)  02/24/16 233 lb 3.2 oz (105.8 kg)    Physical Exam  Constitutional: He is  oriented to person, place, and time. He appears well-developed and well-nourished. No distress.  Eyes: Conjunctivae are normal. Right eye exhibits no discharge. Left eye exhibits no discharge. No scleral icterus.  Cardiovascular: Normal rate, regular rhythm, normal heart sounds and intact distal pulses.   No murmur heard. Pulmonary/Chest: Effort normal and breath sounds normal. No respiratory distress. He has no wheezes. He has no rales.  Abdominal: Soft. Bowel sounds are normal. He exhibits no distension. There is tenderness in the epigastric area. There is no rigidity, no rebound, no guarding and no CVA tenderness.  Musculoskeletal: Normal range of motion. He exhibits no edema.  Neurological: He is alert and oriented to person, place, and time. Coordination normal.  Skin: Skin is warm and dry. No rash noted. He is not  diaphoretic.  Psychiatric: He has a normal mood and affect. His behavior is normal.  Nursing note and vitals reviewed.     Assessment & Plan:   Problem List Items Addressed This Visit    None    Visit Diagnoses    Viral gastroenteritis    -  Primary   Recommended Zofran and hydration and Imodium.   Relevant Medications   ondansetron (ZOFRAN ODT) 4 MG disintegrating tablet       Follow up plan: Return if symptoms worsen or fail to improve.  Counseling provided for all of the vaccine components No orders of the defined types were placed in this encounter.   Arville Care, MD [facility] 05/27/2016, 4:36 PM

## 2016-07-25 ENCOUNTER — Ambulatory Visit (INDEPENDENT_AMBULATORY_CARE_PROVIDER_SITE_OTHER): Payer: BLUE CROSS/BLUE SHIELD | Admitting: Family Medicine

## 2016-07-25 ENCOUNTER — Encounter: Payer: Self-pay | Admitting: Family Medicine

## 2016-07-25 VITALS — BP 111/75 | HR 71 | Temp 97.4°F | Ht 71.0 in | Wt 232.0 lb

## 2016-07-25 DIAGNOSIS — E7800 Pure hypercholesterolemia, unspecified: Secondary | ICD-10-CM

## 2016-07-25 DIAGNOSIS — F3170 Bipolar disorder, currently in remission, most recent episode unspecified: Secondary | ICD-10-CM | POA: Diagnosis not present

## 2016-07-25 DIAGNOSIS — K219 Gastro-esophageal reflux disease without esophagitis: Secondary | ICD-10-CM

## 2016-07-25 DIAGNOSIS — I10 Essential (primary) hypertension: Secondary | ICD-10-CM

## 2016-07-25 DIAGNOSIS — E559 Vitamin D deficiency, unspecified: Secondary | ICD-10-CM

## 2016-07-25 DIAGNOSIS — N400 Enlarged prostate without lower urinary tract symptoms: Secondary | ICD-10-CM | POA: Diagnosis not present

## 2016-07-25 NOTE — Progress Notes (Signed)
Subjective:    Patient ID: Melvin Roberts, male    DOB: [birth year], 56 y.o.   MRN: [id number]  HPI  Pt here for follow up and management of chronic medical problems which includes hyperlipidemia. He is taking medication regualry.The patient is doing well overall and has no specific complaints today. He does have bipolar disorder and we'll make sure that we get a lithium level on his blood work this time. He is taking omeprazole for stomach and lisinopril for his blood pressure. He is also taking omega-3 fatty acids and Prilosec for his reflux. Patient is pleasant and alert. He is been quite busy with helping care for his grandchildren as well as his work. He is going to try to take more time to relax. He denies any chest pain or shortness of breath. He denies any trouble with his stomach including nausea vomiting diarrhea or blood in the stool other than the episode he had with an intestinal bug this winter. He denies any trouble with passing his water. He says his mood has been a little bit irritable but not bad. His wife usually reminds him when he gets out of sorts. He will come back and get his lithium level along with his blood work.    Patient Active Problem List   Diagnosis Date Noted  . Hypertension 09/16/2013  . Bipolar disorder ([place]) 03/11/2013  . Allergic rhinitis 03/11/2013  . BPH (benign prostatic hyperplasia) 03/11/2013  . Hyperlipidemia 05/29/2007  . DEPRESSION, CHRONIC 05/29/2007  . SINUSITIS, RECURRENT 05/29/2007  . ESOPHAGEAL STRICTURE 05/29/2007  . GERD 05/29/2007  . HIATAL HERNIA 05/29/2007  . LOW BACK PAIN, CHRONIC 05/29/2007   Outpatient Encounter Prescriptions as of 07/25/2016  Medication Sig  . cholecalciferol (VITAMIN D) 1000 UNITS tablet Take 1,000 Units by mouth daily.  . fluticasone (FLONASE) 50 MCG/ACT nasal spray One to 2 sprays each nostril daily  . lisinopril (PRINIVIL,ZESTRIL) 10 MG tablet TAKE 1 TABLET (10 MG TOTAL) BY MOUTH DAILY.  Marland Kitchen lithium 300 MG  tablet TAKE 1 TABLET 3 TIMES A DAY  . metaxalone (SKELAXIN) 800 MG tablet TAKE 1 TABLET (800 MG TOTAL) BY MOUTH 3 (THREE) TIMES DAILY.  Marland Kitchen Omega-3 Fatty Acids (FISH OIL PO) Take by mouth.  Marland Kitchen omeprazole (PRILOSEC) 20 MG capsule Take 20 mg by mouth daily.  . [DISCONTINUED] ondansetron (ZOFRAN ODT) 4 MG disintegrating tablet Take 1 tablet (4 mg total) by mouth every 8 (eight) hours as needed for nausea or vomiting.   No facility-administered encounter medications on file as of 07/25/2016.       Review of Systems  Constitutional: Negative.   HENT: Negative.   Eyes: Negative.   Respiratory: Negative.   Cardiovascular: Negative.   Gastrointestinal: Negative.   Endocrine: Negative.   Genitourinary: Negative.   Musculoskeletal: Negative.   Skin: Negative.   Allergic/Immunologic: Negative.   Neurological: Negative.   Hematological: Negative.   Psychiatric/Behavioral: Negative.        Objective:   Physical Exam  Constitutional: He is oriented to person, place, and time. He appears well-developed and well-nourished.  The patient is pleasant and alert and has gained 5 pounds since the last visit  HENT:  Head: Normocephalic and atraumatic.  Right Ear: External ear normal.  Left Ear: External ear normal.  Nose: Nose normal.  Mouth/Throat: Oropharynx is clear and moist. No oropharyngeal exudate.  Eyes: Conjunctivae and EOM are normal. Pupils are equal, round, and reactive to light. Right eye exhibits no discharge. Left eye exhibits no discharge.  No scleral icterus.  Neck: Normal range of motion. Neck supple. No thyromegaly present.  No bruits thyromegaly or anterior cervical adenopathy  Cardiovascular: Normal rate, regular rhythm, normal heart sounds and intact distal pulses.   No murmur heard. The heart has a regular rate and rhythm at 72/m  Pulmonary/Chest: Effort normal and breath sounds normal. No respiratory distress. He has no wheezes. He has no rales. He exhibits no tenderness.  The  lungs are clear anteriorly and posteriorly and no axillary adenopathy  Abdominal: Soft. Bowel sounds are normal. He exhibits no mass. There is no tenderness. There is no rebound and no guarding.  No abdominal tenderness masses or organ enlargement or bruits or inguinal adenopathy  Musculoskeletal: Normal range of motion. He exhibits no edema.  Lymphadenopathy:    He has no cervical adenopathy.  Neurological: He is alert and oriented to person, place, and time. He has normal reflexes. No cranial nerve deficit.  Skin: Skin is warm and dry. No rash noted.  Psychiatric: He has a normal mood and affect. His behavior is normal. Judgment and thought content normal.  Nursing note and vitals reviewed.   BP 111/75 (BP Location: Left Arm)   Pulse 71   Temp 97.4 F (36.3 C) (Oral)   Ht '5\' 11"'$  (1.803 m)   Wt 232 lb (105.2 kg)   BMI 32.36 kg/m        Assessment & Plan:  1. Benign prostatic hyperplasia, unspecified whether lower urinary tract symptoms present -No complaints today with voiding. - CBC with Differential/Platelet; Future  2. Pure hypercholesterolemia -The patient is currently using omega-3 fatty acids and try to watch his diet even though he is gaining weight over the past few months. - CBC with Differential/Platelet; Future - Lipid panel; Future  3. Bipolar affective disorder in remission [facility]) -Patient will get a lithium level with his blood work when he comes in for the blood work. He is doing fairly well with the bipolar disorder although his wife says he gets a little moody at times. - CBC with Differential/Platelet; Future  4. Vitamin D deficiency -Continue current treatment pending results of lab work - CBC with Differential/Platelet; Future - VITAMIN D 25 Hydroxy (Vit-D Deficiency, Fractures); Future  5. Essential hypertension, benign -The blood pressure is excellent today and he will continue with current treatment - BMP8+EGFR; Future - CBC with  Differential/Platelet; Future - Hepatic function panel; Future  6. Gastroesophageal reflux disease, esophagitis presence not specified -Continue with omeprazole and watching his diet closely - CBC with Differential/Platelet; Future - Hepatic function panel; Future  Patient Instructions  Continue current medications. Continue good therapeutic lifestyle changes which include good diet and exercise. Fall precautions discussed with patient. If an FOBT was given today- please return it to our front desk. If you are over 71 years old - you may need Prevnar 31 or the adult Pneumonia vaccine.  **Flu shots are available--- please call and schedule a FLU-CLINIC appointment**  After your visit with [place] today you will receive a survey in the mail or online from [employer] regarding your care with [place]. Please take a moment to fill this out. Your feedback is very important to [place] as you can help [place] better understand your patient needs as well as improve your experience and satisfaction. WE CARE ABOUT YOU!!!   Work on losing the weight Try to get out and get more physical activity on the weekends Return to the office for fasting lab work is planned  Arrie Senate MD

## 2016-07-25 NOTE — Patient Instructions (Addendum)
Continue current medications. Continue good therapeutic lifestyle changes which include good diet and exercise. Fall precautions discussed with patient. If an FOBT was given today- please return it to our front desk. If you are over 55 years old - you may need Prevnar 13 or the adult Pneumonia vaccine.  **Flu shots are available--- please call and schedule a FLU-CLINIC appointment**  After your visit with [place] today you will receive a survey in the mail or online from [employer] regarding your care with [place]. Please take a moment to fill this out. Your feedback is very important to [place] as you can help [place] better understand your patient needs as well as improve your experience and satisfaction. WE CARE ABOUT YOU!!!   Work on losing the weight Try to get out and get more physical activity on the weekends Return to the office for fasting lab work is planned

## 2016-08-08 ENCOUNTER — Other Ambulatory Visit: Payer: BLUE CROSS/BLUE SHIELD

## 2016-08-08 DIAGNOSIS — K219 Gastro-esophageal reflux disease without esophagitis: Secondary | ICD-10-CM

## 2016-08-08 DIAGNOSIS — F3170 Bipolar disorder, currently in remission, most recent episode unspecified: Secondary | ICD-10-CM

## 2016-08-08 DIAGNOSIS — E7800 Pure hypercholesterolemia, unspecified: Secondary | ICD-10-CM | POA: Diagnosis not present

## 2016-08-08 DIAGNOSIS — I10 Essential (primary) hypertension: Secondary | ICD-10-CM | POA: Diagnosis not present

## 2016-08-08 DIAGNOSIS — E559 Vitamin D deficiency, unspecified: Secondary | ICD-10-CM | POA: Diagnosis not present

## 2016-08-08 DIAGNOSIS — N400 Enlarged prostate without lower urinary tract symptoms: Secondary | ICD-10-CM

## 2016-08-09 ENCOUNTER — Telehealth: Payer: Self-pay | Admitting: Family Medicine

## 2016-08-09 ENCOUNTER — Other Ambulatory Visit: Payer: Self-pay | Admitting: *Deleted

## 2016-08-09 DIAGNOSIS — D649 Anemia, unspecified: Secondary | ICD-10-CM

## 2016-08-09 DIAGNOSIS — E7800 Pure hypercholesterolemia, unspecified: Secondary | ICD-10-CM

## 2016-08-09 LAB — LIPID PANEL
CHOLESTEROL TOTAL: 178 mg/dL (ref 100–199)
Chol/HDL Ratio: 5.2 ratio — ABNORMAL HIGH (ref 0.0–5.0)
HDL: 34 mg/dL — AB (ref >39)
LDL Calculated: 89 mg/dL (ref 0–99)
TRIGLYCERIDES: 274 mg/dL — AB (ref 0–149)
VLDL CHOLESTEROL CAL: 55 mg/dL — AB (ref 5–40)

## 2016-08-09 LAB — CBC WITH DIFFERENTIAL/PLATELET
BASOS ABS: 0 10*3/uL (ref 0.0–0.2)
Basos: 0 %
EOS (ABSOLUTE): 0.1 10*3/uL (ref 0.0–0.4)
Eos: 2 %
Hematocrit: 36.7 % — ABNORMAL LOW (ref 37.5–51.0)
Hemoglobin: 11.3 g/dL — ABNORMAL LOW (ref 13.0–17.7)
Immature Grans (Abs): 0 10*3/uL (ref 0.0–0.1)
Immature Granulocytes: 0 %
LYMPHS ABS: 1.6 10*3/uL (ref 0.7–3.1)
Lymphs: 22 %
MCH: 24.5 pg — AB (ref 26.6–33.0)
MCHC: 30.8 g/dL — AB (ref 31.5–35.7)
MCV: 80 fL (ref 79–97)
MONOS ABS: 0.4 10*3/uL (ref 0.1–0.9)
Monocytes: 6 %
Neutrophils Absolute: 4.9 10*3/uL (ref 1.4–7.0)
Neutrophils: 70 %
Platelets: 312 10*3/uL (ref 150–379)
RBC: 4.61 x10E6/uL (ref 4.14–5.80)
RDW: 16.7 % — AB (ref 12.3–15.4)
WBC: 7.1 10*3/uL (ref 3.4–10.8)

## 2016-08-09 LAB — BMP8+EGFR
BUN / CREAT RATIO: 10 (ref 9–20)
BUN: 12 mg/dL (ref 6–24)
CHLORIDE: 104 mmol/L (ref 96–106)
CO2: 23 mmol/L (ref 18–29)
Calcium: 10 mg/dL (ref 8.7–10.2)
Creatinine, Ser: 1.18 mg/dL (ref 0.76–1.27)
GFR calc Af Amer: 80 mL/min/{1.73_m2} (ref >59)
GFR calc non Af Amer: 69 mL/min/{1.73_m2} (ref >59)
GLUCOSE: 100 mg/dL — AB (ref 65–99)
POTASSIUM: 5.4 mmol/L — AB (ref 3.5–5.2)
SODIUM: 139 mmol/L (ref 134–144)

## 2016-08-09 LAB — LITHIUM LEVEL: Lithium Lvl: 0.5 mmol/L — ABNORMAL LOW (ref 0.6–1.2)

## 2016-08-09 LAB — VITAMIN D 25 HYDROXY (VIT D DEFICIENCY, FRACTURES): VIT D 25 HYDROXY: 43.4 ng/mL (ref 30.0–100.0)

## 2016-08-09 LAB — HEPATIC FUNCTION PANEL
ALBUMIN: 4.4 g/dL (ref 3.5–5.5)
ALT: 25 IU/L (ref 0–44)
AST: 33 IU/L (ref 0–40)
Alkaline Phosphatase: 73 IU/L (ref 39–117)
Bilirubin Total: 0.3 mg/dL (ref 0.0–1.2)
Bilirubin, Direct: 0.07 mg/dL (ref 0.00–0.40)
TOTAL PROTEIN: 7 g/dL (ref 6.0–8.5)

## 2016-08-09 MED ORDER — FENOFIBRATE 150 MG PO CAPS: 150.0000 mg | ORAL_CAPSULE | Freq: Every day | ORAL | 5 refills | Status: DC

## 2016-08-09 NOTE — Telephone Encounter (Signed)
Call given to nurse °

## 2016-08-11 ENCOUNTER — Telehealth: Payer: Self-pay

## 2016-08-12 MED ORDER — FENOFIBRATE 145 MG PO TABS: 145.0000 mg | ORAL_TABLET | Freq: Every day | ORAL | 5 refills | Status: DC

## 2016-08-12 NOTE — Telephone Encounter (Signed)
Left message- medication has been change and to call back with details.

## 2016-08-12 NOTE — Telephone Encounter (Signed)
Changed fenofibrate to tricor due  To insurance

## 2016-08-15 ENCOUNTER — Other Ambulatory Visit: Payer: BLUE CROSS/BLUE SHIELD

## 2016-08-15 DIAGNOSIS — Z1211 Encounter for screening for malignant neoplasm of colon: Secondary | ICD-10-CM | POA: Diagnosis not present

## 2016-08-22 LAB — FECAL OCCULT BLOOD, IMMUNOCHEMICAL: Fecal Occult Bld: NEGATIVE

## 2016-09-02 ENCOUNTER — Other Ambulatory Visit: Payer: Self-pay | Admitting: Family Medicine

## 2016-10-17 ENCOUNTER — Other Ambulatory Visit: Payer: Self-pay | Admitting: Family Medicine

## 2016-10-17 ENCOUNTER — Telehealth: Payer: Self-pay | Admitting: Family Medicine

## 2016-10-17 MED ORDER — METAXALONE 800 MG PO TABS: ORAL_TABLET | ORAL | 1 refills | Status: DC

## 2016-10-17 NOTE — Telephone Encounter (Signed)
What is the name of the medication? metaxalone  Have you contacted your pharmacy to request a refill? yes  Which pharmacy would you like this sent to? [facility] in [place].   Patient notified that their request is being sent to the clinical staff for review and that they should receive a call once it is complete. If they do not receive a call within 24 hours they can check with their pharmacy or our office.

## 2016-10-17 NOTE — Telephone Encounter (Signed)
Refill sent in

## 2017-01-09 ENCOUNTER — Encounter: Payer: Self-pay | Admitting: *Deleted

## 2017-01-23 ENCOUNTER — Ambulatory Visit: Payer: BLUE CROSS/BLUE SHIELD | Admitting: Family Medicine

## 2017-02-01 ENCOUNTER — Ambulatory Visit: Payer: BLUE CROSS/BLUE SHIELD | Admitting: Family Medicine

## 2017-02-20 ENCOUNTER — Ambulatory Visit (INDEPENDENT_AMBULATORY_CARE_PROVIDER_SITE_OTHER): Payer: BLUE CROSS/BLUE SHIELD | Admitting: Family Medicine

## 2017-02-20 ENCOUNTER — Encounter: Payer: Self-pay | Admitting: Family Medicine

## 2017-02-20 ENCOUNTER — Encounter (INDEPENDENT_AMBULATORY_CARE_PROVIDER_SITE_OTHER): Payer: Self-pay

## 2017-02-20 DIAGNOSIS — E559 Vitamin D deficiency, unspecified: Secondary | ICD-10-CM

## 2017-02-20 DIAGNOSIS — I10 Essential (primary) hypertension: Secondary | ICD-10-CM

## 2017-02-20 DIAGNOSIS — Z0000 Encounter for general adult medical examination without abnormal findings: Secondary | ICD-10-CM

## 2017-02-20 DIAGNOSIS — N400 Enlarged prostate without lower urinary tract symptoms: Secondary | ICD-10-CM | POA: Diagnosis not present

## 2017-02-20 DIAGNOSIS — F3170 Bipolar disorder, currently in remission, most recent episode unspecified: Secondary | ICD-10-CM | POA: Diagnosis not present

## 2017-02-20 DIAGNOSIS — K219 Gastro-esophageal reflux disease without esophagitis: Secondary | ICD-10-CM

## 2017-02-20 LAB — URINALYSIS, COMPLETE
BILIRUBIN UA: NEGATIVE
Glucose, UA: NEGATIVE
Ketones, UA: NEGATIVE
Nitrite, UA: NEGATIVE
PH UA: 6 (ref 5.0–7.5)
Protein, UA: NEGATIVE
RBC UA: NEGATIVE
Specific Gravity, UA: 1.01 (ref 1.005–1.030)
UUROB: 0.2 mg/dL (ref 0.2–1.0)

## 2017-02-20 LAB — MICROSCOPIC EXAMINATION
BACTERIA UA: NONE SEEN
EPITHELIAL CELLS (NON RENAL): NONE SEEN /HPF (ref 0–10)
RBC, UA: NONE SEEN /hpf (ref 0–?)
RENAL EPITHEL UA: NONE SEEN /HPF

## 2017-02-20 NOTE — Progress Notes (Signed)
Subjective:    Patient ID: Melvin Roberts, male    DOB: 30-Oct-1960, 56 y.o.   MRN: [id number]  HPI Patient is here today for annual wellness exam and follow up of chronic medical problems which includes hypertension and hyperlipidemia. He is taking medication regularly.  Patient is here today for his yearly physical exam.  The patient is doing well today with no specific complaints.  Patient works with Hospital doctor.  There is always a lot of stress at work.  They have lost some good people recently to higher paying jobs.  Patient will be getting an eye exam soon and he understands to have [place] send a copy of that report.  He denies any chest pain or shortness of breath.  He denies any trouble with his stomach including nausea vomiting diarrhea blood in the stool or black tarry bowel movements.  He says his bowel habits have not changed any.  He is not having any trouble passing his water.  He will check with his insurance to see if and when he can take a shingles shot.  He had the flu shot at work.  The family history is positive for breast cancer in his mother and she is still living and his father died with lung cancer.      Patient Active Problem List   Diagnosis Date Noted  . Hypertension 09/16/2013  . Bipolar disorder ([place]) 03/11/2013  . Allergic rhinitis 03/11/2013  . BPH (benign prostatic hyperplasia) 03/11/2013  . Hyperlipidemia 05/29/2007  . DEPRESSION, CHRONIC 05/29/2007  . SINUSITIS, RECURRENT 05/29/2007  . ESOPHAGEAL STRICTURE 05/29/2007  . GERD 05/29/2007  . HIATAL HERNIA 05/29/2007  . LOW BACK PAIN, CHRONIC 05/29/2007   Outpatient Encounter Medications as of 02/20/2017  Medication Sig  . cholecalciferol (VITAMIN D) 1000 UNITS tablet Take 1,000 Units by mouth daily.  . fenofibrate (TRICOR) 145 MG tablet Take 1 tablet (145 mg total) by mouth daily.  . fluticasone (FLONASE) 50 MCG/ACT nasal spray One to 2 sprays each nostril daily  . lisinopril (PRINIVIL,ZESTRIL) 10  MG tablet TAKE 1 TABLET (10 MG TOTAL) BY MOUTH DAILY.  Marland Kitchen lithium 300 MG tablet TAKE 1 TABLET 3 TIMES A DAY  . metaxalone (SKELAXIN) 800 MG tablet TAKE 1 TABLET (800 MG TOTAL) BY MOUTH 3 (THREE) TIMES DAILY.  Marland Kitchen Omega-3 Fatty Acids (FISH OIL PO) Take by mouth.  Marland Kitchen omeprazole (PRILOSEC) 20 MG capsule Take 20 mg by mouth daily.   No facility-administered encounter medications on file as of 02/20/2017.      Review of Systems  Constitutional: Negative.   HENT: Negative.   Eyes: Negative.   Respiratory: Negative.   Cardiovascular: Negative.   Gastrointestinal: Negative.   Endocrine: Negative.   Genitourinary: Negative.   Musculoskeletal: Negative.   Skin: Negative.   Allergic/Immunologic: Negative.   Neurological: Negative.   Hematological: Negative.   Psychiatric/Behavioral: Negative.        Objective:   Physical Exam  Constitutional: He is oriented to person, place, and time. He appears well-developed and well-nourished. No distress.  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  HENT:  Head: Normocephalic and atraumatic.  Right Ear: External ear normal.  Left Ear: External ear normal.  Mouth/Throat: Oropharynx is clear and moist. No oropharyngeal exudate.  Slight nasal congestion bilaterally  Eyes: Conjunctivae and EOM are normal. Pupils are equal, round, and reactive to light. Right eye exhibits no  discharge. Left eye exhibits no discharge. No scleral icterus.  Neck: Normal range of motion. Neck supple. No thyromegaly present.  No bruits thyromegaly or anterior cervical adenopathy  Cardiovascular: Normal rate, regular rhythm, normal heart sounds and intact distal pulses.  No murmur heard. Heart is regular at 60/min  Pulmonary/Chest: Effort normal and breath sounds normal. No respiratory distress. He has no wheezes. He has no rales. He exhibits no tenderness.  Clear anteriorly and posteriorly with  no axillary adenopathy or chest wall tenderness or masses.  Abdominal: Soft. Bowel sounds are normal. He exhibits no mass. There is no tenderness. There is no rebound and no guarding.  No liver or spleen enlargement no organ enlargement no bruits no inguinal adenopathy  Genitourinary: Rectum normal and penis normal.  Genitourinary Comments: Prostate is enlarged but soft and smooth.  There were no hernias palpable.  Rectal exam was normal.  External genitalia was within normal limits.  Musculoskeletal: Normal range of motion. He exhibits no edema.  Lymphadenopathy:    He has no cervical adenopathy.  Neurological: He is alert and oriented to person, place, and time. He has normal reflexes. No cranial nerve deficit.  Skin: Skin is warm and dry. No rash noted.  Psychiatric: He has a normal mood and affect. His behavior is normal. Judgment and thought content normal.  Nursing note and vitals reviewed.   BP 117/78 (BP Location: Left Arm)   Pulse (!) 58   Temp 97.7 F (36.5 C) (Oral)   Ht '5\' 11"'  (1.803 m)   Wt 234 lb (106.1 kg)   BMI 32.64 kg/m    EKG with results pending===    Assessment & Plan:  1. Annual physical exam -Colonoscopy is not due until 2025 - CBC with Differential/Platelet - BMP8+EGFR - Lipid panel - VITAMIN D 25 Hydroxy (Vit-D Deficiency, Fractures) - Hepatic function panel - PSA, total and free - Lithium level - Urinalysis, Complete - EKG 12-Lead  2. Benign prostatic hyperplasia, unspecified whether lower urinary tract symptoms present -The prostate is enlarged but soft and smooth the patient is asymptomatic with this. - CBC with Differential/Platelet - PSA, total and free - Urinalysis, Complete  3. Vitamin D deficiency -Continue current treatment pending results of lab work - CBC with Differential/Platelet - VITAMIN D 25 Hydroxy (Vit-D Deficiency, Fractures)  4. Essential hypertension, benign -Blood pressure is good today and he will continue with current  treatment - CBC with Differential/Platelet - BMP8+EGFR - Hepatic function panel - EKG 12-Lead  5. Bipolar affective disorder in remission [facility]) -This is stable and in remission and the patient is doing well with his current treatment regimen - CBC with Differential/Platelet - Lithium level  6. Gastroesophageal reflux disease, esophagitis presence not specified - his omeprazole and watching his diet closely. - CBC with Differential/Platelet - Hepatic function panel  Patient Instructions  Continue current medications. Continue good therapeutic lifestyle changes which include good diet and exercise. Fall precautions discussed with patient. If an FOBT was given today- please return it to our front desk. If you are over 80 years old - you may need Prevnar 88 or the adult Pneumonia vaccine.  **Flu shots are available--- please call and schedule a FLU-CLINIC appointment**  After your visit with [place] today you will receive a survey in the mail or online from [employer] regarding your care with [place]. Please take a moment to fill this out. Your feedback is very important to [place] as you can help [place] better understand your patient needs  as well as improve your experience and satisfaction. WE CARE ABOUT YOU!!!  Continue to eat healthy stay active physically.  Drink plenty of water and lose as much weight as possible Will call you with your lab results as soon as they become available as well as with a chest x-ray reading once that becomes available   Arrie Senate MD

## 2017-02-20 NOTE — Patient Instructions (Addendum)
Continue current medications. Continue good therapeutic lifestyle changes which include good diet and exercise. Fall precautions discussed with patient. If an FOBT was given today- please return it to our front desk. If you are over 56 years old - you may need Prevnar 13 or the adult Pneumonia vaccine.  **Flu shots are available--- please call and schedule a FLU-CLINIC appointment**  After your visit with us today you will receive a survey in the mail or online from [employer] regarding your care with us. Please take a moment to fill this out. Your feedback is very important to us as you can help us better understand your patient needs as well as improve your experience and satisfaction. WE CARE ABOUT YOU!!!  Continue to eat healthy stay active physically.  Drink plenty of water and lose as much weight as possible Will call you with your lab results as soon as they become available

## 2017-02-21 LAB — BMP8+EGFR
BUN/Creatinine Ratio: 11 (ref 9–20)
BUN: 13 mg/dL (ref 6–24)
CALCIUM: 9.8 mg/dL (ref 8.7–10.2)
CHLORIDE: 100 mmol/L (ref 96–106)
CO2: 21 mmol/L (ref 20–29)
Creatinine, Ser: 1.14 mg/dL (ref 0.76–1.27)
GFR calc non Af Amer: 72 mL/min/{1.73_m2} (ref >59)
GFR, EST AFRICAN AMERICAN: 83 mL/min/{1.73_m2} (ref >59)
Glucose: 86 mg/dL (ref 65–99)
POTASSIUM: 4.8 mmol/L (ref 3.5–5.2)
Sodium: 136 mmol/L (ref 134–144)

## 2017-02-21 LAB — PSA, TOTAL AND FREE
PROSTATE SPECIFIC AG, SERUM: 1.7 ng/mL (ref 0.0–4.0)
PSA, Free Pct: 24.1 %
PSA, Free: 0.41 ng/mL

## 2017-02-21 LAB — CBC WITH DIFFERENTIAL/PLATELET
BASOS: 0 %
Basophils Absolute: 0 10*3/uL (ref 0.0–0.2)
EOS (ABSOLUTE): 0.1 10*3/uL (ref 0.0–0.4)
EOS: 1 %
Hematocrit: 37 % — ABNORMAL LOW (ref 37.5–51.0)
Hemoglobin: 12.3 g/dL — ABNORMAL LOW (ref 13.0–17.7)
IMMATURE GRANS (ABS): 0 10*3/uL (ref 0.0–0.1)
IMMATURE GRANULOCYTES: 0 %
LYMPHS: 25 %
Lymphocytes Absolute: 2.3 10*3/uL (ref 0.7–3.1)
MCH: 28.6 pg (ref 26.6–33.0)
MCHC: 33.2 g/dL (ref 31.5–35.7)
MCV: 86 fL (ref 79–97)
Monocytes Absolute: 0.5 10*3/uL (ref 0.1–0.9)
Monocytes: 5 %
NEUTROS PCT: 69 %
Neutrophils Absolute: 6.4 10*3/uL (ref 1.4–7.0)
PLATELETS: 267 10*3/uL (ref 150–379)
RBC: 4.3 x10E6/uL (ref 4.14–5.80)
RDW: 16.6 % — AB (ref 12.3–15.4)
WBC: 9.4 10*3/uL (ref 3.4–10.8)

## 2017-02-21 LAB — LIPID PANEL
CHOLESTEROL TOTAL: 192 mg/dL (ref 100–199)
Chol/HDL Ratio: 5.6 ratio — ABNORMAL HIGH (ref 0.0–5.0)
HDL: 34 mg/dL — AB (ref >39)
TRIGLYCERIDES: 445 mg/dL — AB (ref 0–149)

## 2017-02-21 LAB — HEPATIC FUNCTION PANEL
ALK PHOS: 68 IU/L (ref 39–117)
ALT: 28 IU/L (ref 0–44)
AST: 33 IU/L (ref 0–40)
Albumin: 4.7 g/dL (ref 3.5–5.5)
BILIRUBIN TOTAL: 0.2 mg/dL (ref 0.0–1.2)
BILIRUBIN, DIRECT: 0.05 mg/dL (ref 0.00–0.40)
Total Protein: 7.4 g/dL (ref 6.0–8.5)

## 2017-02-21 LAB — LITHIUM LEVEL: LITHIUM LVL: 0.8 mmol/L (ref 0.6–1.2)

## 2017-02-21 LAB — VITAMIN D 25 HYDROXY (VIT D DEFICIENCY, FRACTURES): VIT D 25 HYDROXY: 61.2 ng/mL (ref 30.0–100.0)

## 2017-03-07 ENCOUNTER — Other Ambulatory Visit: Payer: Self-pay | Admitting: Family Medicine

## 2017-03-07 ENCOUNTER — Encounter: Payer: Self-pay | Admitting: *Deleted

## 2017-04-26 ENCOUNTER — Other Ambulatory Visit: Payer: Self-pay | Admitting: Family Medicine

## 2017-04-27 ENCOUNTER — Other Ambulatory Visit: Payer: Self-pay | Admitting: Family Medicine

## 2017-06-05 ENCOUNTER — Other Ambulatory Visit: Payer: Self-pay

## 2017-06-05 MED ORDER — LITHIUM CARBONATE 300 MG PO TABS
300.0000 mg | ORAL_TABLET | Freq: Three times a day (TID) | ORAL | 0 refills | Status: DC
Start: 2017-06-05 — End: 2017-08-29

## 2017-06-05 NOTE — Telephone Encounter (Signed)
Last seen 02/20/17  DWM 

## 2017-07-25 ENCOUNTER — Other Ambulatory Visit: Payer: Self-pay | Admitting: Family Medicine

## 2017-08-21 ENCOUNTER — Encounter: Payer: Self-pay | Admitting: Family Medicine

## 2017-08-21 ENCOUNTER — Ambulatory Visit: Payer: BLUE CROSS/BLUE SHIELD | Admitting: Family Medicine

## 2017-08-21 ENCOUNTER — Ambulatory Visit (INDEPENDENT_AMBULATORY_CARE_PROVIDER_SITE_OTHER): Payer: BLUE CROSS/BLUE SHIELD

## 2017-08-21 VITALS — BP 125/79 | HR 58 | Temp 97.5°F | Ht 71.0 in | Wt 225.0 lb

## 2017-08-21 DIAGNOSIS — F3170 Bipolar disorder, currently in remission, most recent episode unspecified: Secondary | ICD-10-CM

## 2017-08-21 DIAGNOSIS — R6889 Other general symptoms and signs: Secondary | ICD-10-CM | POA: Diagnosis not present

## 2017-08-21 DIAGNOSIS — E7800 Pure hypercholesterolemia, unspecified: Secondary | ICD-10-CM | POA: Diagnosis not present

## 2017-08-21 DIAGNOSIS — F439 Reaction to severe stress, unspecified: Secondary | ICD-10-CM

## 2017-08-21 DIAGNOSIS — I10 Essential (primary) hypertension: Secondary | ICD-10-CM

## 2017-08-21 DIAGNOSIS — N400 Enlarged prostate without lower urinary tract symptoms: Secondary | ICD-10-CM | POA: Diagnosis not present

## 2017-08-21 DIAGNOSIS — R5383 Other fatigue: Secondary | ICD-10-CM

## 2017-08-21 DIAGNOSIS — E559 Vitamin D deficiency, unspecified: Secondary | ICD-10-CM | POA: Diagnosis not present

## 2017-08-21 DIAGNOSIS — K219 Gastro-esophageal reflux disease without esophagitis: Secondary | ICD-10-CM

## 2017-08-21 NOTE — Patient Instructions (Addendum)
Continue current medications. Continue good therapeutic lifestyle changes which include good diet and exercise. Fall precautions discussed with patient. If an FOBT was given today- please return it to our front desk. If you are over 57 years old - you may need Prevnar 13 or the adult Pneumonia vaccine.  **Flu shots are available--- please call and schedule a FLU-CLINIC appointment**  After your visit with [place] today you will receive a survey in the mail or online from [employer] regarding your care with [place]. Please take a moment to fill this out. Your feedback is very important to [place] as you can help [place] better understand your patient needs as well as improve your experience and satisfaction. WE CARE ABOUT YOU!!!   Reduce stress as much as possible Avoid caffeine and fried foods as much as possible Continue with omeprazole Pray regularly for your safety and that of others We will call with lab work results as soon as these results become available Return the FOBT

## 2017-08-21 NOTE — Progress Notes (Signed)
Subjective:    Patient ID: Melvin Roberts, male    DOB: 07/02/1960, 57 y.o.   MRN: [id number]  HPI Pt here for follow up and management of chronic medical problems which includes hyperlipidemia and hypertension. He is taking medication regularly.  The patient is doing well overall other than some complaints with fatigue which he thinks are work related.  He is due to return in FOBT.  His last colonoscopy was in 2015.  His chest x-ray is due today and lab work is due today.  He did not take a flu shot because he refused.  He will check with his insurance regarding the Prevnar vaccine.  The patient indicates there is a lot of stress at work and that this is very fatiguing.  A lot of people are retiring and there are a lot of new hires.  He denies any chest pain pressure tightness or shortness of breath.  He denies any trouble with his intestinal tract including heartburn indigestion nausea vomiting diarrhea blood in the stool or black tarry bowel movements.  He does indicate that he took some ibuprofen for an abscessed tooth and had some dark stools during that time but they are back to normal now.  His last colonoscopy was good in 2015 and he says the next was to be done 10 years after that time with no family history of colon cancer or colon polyps.  He is passing his water without problems.     Patient Active Problem List   Diagnosis Date Noted  . Hypertension 09/16/2013  . Bipolar disorder ([place]) 03/11/2013  . Allergic rhinitis 03/11/2013  . BPH (benign prostatic hyperplasia) 03/11/2013  . Hyperlipidemia 05/29/2007  . DEPRESSION, CHRONIC 05/29/2007  . SINUSITIS, RECURRENT 05/29/2007  . ESOPHAGEAL STRICTURE 05/29/2007  . GERD 05/29/2007  . HIATAL HERNIA 05/29/2007  . LOW BACK PAIN, CHRONIC 05/29/2007   Outpatient Encounter Medications as of 08/21/2017  Medication Sig  . cholecalciferol (VITAMIN D) 1000 UNITS tablet Take 1,000 Units by mouth daily.  . fenofibrate (TRICOR) 145 MG tablet  Take 1 tablet (145 mg total) by mouth daily.  . fluticasone (FLONASE) 50 MCG/ACT nasal spray One to 2 sprays each nostril daily  . lisinopril (PRINIVIL,ZESTRIL) 10 MG tablet TAKE 1 TABLET (10 MG TOTAL) BY MOUTH DAILY.  Marland Kitchen lithium 300 MG tablet Take 1 tablet (300 mg total) by mouth 3 (three) times daily.  . metaxalone (SKELAXIN) 800 MG tablet TAKE 1 TABLET BY MOUTH THREE TIMES A DAY  . Omega-3 Fatty Acids (FISH OIL PO) Take by mouth.  Marland Kitchen omeprazole (PRILOSEC) 20 MG capsule Take 20 mg by mouth daily.   No facility-administered encounter medications on file as of 08/21/2017.      Review of Systems  Constitutional: Positive for fatigue.  HENT: Negative.   Eyes: Negative.   Respiratory: Negative.   Cardiovascular: Negative.   Gastrointestinal: Negative.   Endocrine: Negative.   Genitourinary: Negative.   Musculoskeletal: Negative.   Skin: Negative.   Allergic/Immunologic: Negative.   Neurological: Negative.   Hematological: Negative.   Psychiatric/Behavioral: Negative.        Objective:   Physical Exam  Constitutional: He is oriented to person, place, and time. He appears well-developed and well-nourished. No distress.  Patient is pleasant and alert and somewhat stressed by his place of employment.  HENT:  Head: Normocephalic and atraumatic.  Right Ear: External ear normal.  Left Ear: External ear normal.  Mouth/Throat: Oropharynx is clear and moist. No oropharyngeal exudate.  Nasal  turbinate congestion right greater than left  Eyes: Pupils are equal, round, and reactive to light. Conjunctivae and EOM are normal. Right eye exhibits no discharge. Left eye exhibits no discharge.  Neck: Normal range of motion. Neck supple. No thyromegaly present.  No bruits thyromegaly or anterior cervical adenopathy  Cardiovascular: Normal rate, regular rhythm, normal heart sounds and intact distal pulses.  No murmur heard. Heart is regular at 60/min  Pulmonary/Chest: Effort normal and breath sounds  normal. No respiratory distress. He has no wheezes. He has no rales. He exhibits no tenderness.  Clear anteriorly and posteriorly with no chest wall masses or axillary adenopathy  Abdominal: Soft. Bowel sounds are normal. He exhibits no mass. There is no tenderness. There is no guarding.  No liver or spleen enlargement.  No epigastric tenderness.  No masses.  No bruits.  Musculoskeletal: Normal range of motion. He exhibits no edema.  Lymphadenopathy:    He has no cervical adenopathy.  Neurological: He is alert and oriented to person, place, and time. He has normal reflexes. No cranial nerve deficit.  Skin: Skin is warm and dry. No rash noted.  Psychiatric: He has a normal mood and affect. His behavior is normal. Judgment and thought content normal.  We discussed stress in his life and stress at work and the patient understands to reduce any contributing factors to the stress as much as possible.  Nursing note and vitals reviewed.  BP 125/79 (BP Location: Left Arm)   Pulse (!) 58   Temp (!) 97.5 F (36.4 C) (Oral)   Ht '5\' 11"'  (1.803 m)   Wt 225 lb (102.1 kg)   BMI 31.38 kg/m   Chest x-ray with results pending===      Assessment & Plan:  1. Pure hypercholesterolemia -Continue with aggressive therapeutic lifestyle changes and current treatment pending results of lab work - CBC with Differential/Platelet - Lipid panel - DG Chest 2 View; Future  2. Benign prostatic hyperplasia, unspecified whether lower urinary tract symptoms present -No complaints today with voiding.  Continue to drink plenty of fluids and stay well-hydrated and avoid caffeine as much as possible - CBC with Differential/Platelet  3. Vitamin D deficiency -Continue with current treatment pending results of lab work - CBC with Differential/Platelet - VITAMIN D 25 Hydroxy (Vit-D Deficiency, Fractures)  4. Essential hypertension, benign -Blood pressure is good today and patient is to be applauded with his efforts at  losing weight and watching sodium intake - BMP8+EGFR - CBC with Differential/Platelet - Hepatic function panel - DG Chest 2 View; Future  5. Gastroesophageal reflux disease, esophagitis presence not specified -Watch diet reduce caffeine and continue with omeprazole - CBC with Differential/Platelet - Hepatic function panel  6. Other fatigue - Thyroid Panel With TSH - Lithium level  7. Bipolar affective disorder in remission [facility]) -Continue with current treatment pending results of lab work - Lithium level  8. Situational stress -Reduce stress as much as possible through diet exercise and prayer  Patient Instructions  Continue current medications. Continue good therapeutic lifestyle changes which include good diet and exercise. Fall precautions discussed with patient. If an FOBT was given today- please return it to our front desk. If you are over 94 years old - you may need Prevnar 37 or the adult Pneumonia vaccine.  **Flu shots are available--- please call and schedule a FLU-CLINIC appointment**  After your visit with [place] today you will receive a survey in the mail or online from [employer] regarding your care with  [place]. Please take a moment to fill this out. Your feedback is very important to [place] as you can help [place] better understand your patient needs as well as improve your experience and satisfaction. WE CARE ABOUT YOU!!!   Reduce stress as much as possible Avoid caffeine and fried foods as much as possible Continue with omeprazole Pray regularly for your safety and that of others We will call with lab work results as soon as these results become available Return the FOBT   Arrie Senate MD

## 2017-08-22 ENCOUNTER — Other Ambulatory Visit: Payer: Self-pay

## 2017-08-22 LAB — CBC WITH DIFFERENTIAL/PLATELET
BASOS ABS: 0 10*3/uL (ref 0.0–0.2)
Basos: 0 %
EOS (ABSOLUTE): 0.1 10*3/uL (ref 0.0–0.4)
Eos: 2 %
HEMOGLOBIN: 12.6 g/dL — AB (ref 13.0–17.7)
Hematocrit: 38.6 % (ref 37.5–51.0)
IMMATURE GRANULOCYTES: 0 %
Immature Grans (Abs): 0 10*3/uL (ref 0.0–0.1)
Lymphocytes Absolute: 1.4 10*3/uL (ref 0.7–3.1)
Lymphs: 21 %
MCH: 29.1 pg (ref 26.6–33.0)
MCHC: 32.6 g/dL (ref 31.5–35.7)
MCV: 89 fL (ref 79–97)
MONOCYTES: 4 %
MONOS ABS: 0.3 10*3/uL (ref 0.1–0.9)
NEUTROS PCT: 73 %
Neutrophils Absolute: 4.6 10*3/uL (ref 1.4–7.0)
Platelets: 277 10*3/uL (ref 150–379)
RBC: 4.33 x10E6/uL (ref 4.14–5.80)
RDW: 14.4 % (ref 12.3–15.4)
WBC: 6.4 10*3/uL (ref 3.4–10.8)

## 2017-08-22 LAB — LIPID PANEL
CHOL/HDL RATIO: 5.5 ratio — AB (ref 0.0–5.0)
CHOLESTEROL TOTAL: 187 mg/dL (ref 100–199)
HDL: 34 mg/dL — AB (ref >39)
LDL Calculated: 93 mg/dL (ref 0–99)
TRIGLYCERIDES: 300 mg/dL — AB (ref 0–149)
VLDL Cholesterol Cal: 60 mg/dL — ABNORMAL HIGH (ref 5–40)

## 2017-08-22 LAB — HEPATIC FUNCTION PANEL
ALBUMIN: 4.4 g/dL (ref 3.5–5.5)
ALK PHOS: 74 IU/L (ref 39–117)
ALT: 25 IU/L (ref 0–44)
AST: 28 IU/L (ref 0–40)
BILIRUBIN TOTAL: 0.3 mg/dL (ref 0.0–1.2)
BILIRUBIN, DIRECT: 0.1 mg/dL (ref 0.00–0.40)
Total Protein: 7 g/dL (ref 6.0–8.5)

## 2017-08-22 LAB — THYROID PANEL WITH TSH
FREE THYROXINE INDEX: 1.3 (ref 1.2–4.9)
T3 Uptake Ratio: 23 % — ABNORMAL LOW (ref 24–39)
T4, Total: 5.5 ug/dL (ref 4.5–12.0)
TSH: 1.35 u[IU]/mL (ref 0.450–4.500)

## 2017-08-22 LAB — BMP8+EGFR
BUN/Creatinine Ratio: 13 (ref 9–20)
BUN: 15 mg/dL (ref 6–24)
CO2: 21 mmol/L (ref 20–29)
CREATININE: 1.2 mg/dL (ref 0.76–1.27)
Calcium: 9.8 mg/dL (ref 8.7–10.2)
Chloride: 104 mmol/L (ref 96–106)
GFR calc Af Amer: 78 mL/min/{1.73_m2} (ref >59)
GFR calc non Af Amer: 67 mL/min/{1.73_m2} (ref >59)
Glucose: 97 mg/dL (ref 65–99)
Potassium: 5 mmol/L (ref 3.5–5.2)
Sodium: 141 mmol/L (ref 134–144)

## 2017-08-22 LAB — VITAMIN D 25 HYDROXY (VIT D DEFICIENCY, FRACTURES): Vit D, 25-Hydroxy: 40.4 ng/mL (ref 30.0–100.0)

## 2017-08-22 LAB — LITHIUM LEVEL: LITHIUM LVL: 0.6 mmol/L (ref 0.6–1.2)

## 2017-08-22 MED ORDER — ICOSAPENT ETHYL 1 G PO CAPS: 1.0000 g | ORAL_CAPSULE | Freq: Two times a day (BID) | ORAL | 2 refills | Status: DC

## 2017-08-22 MED ORDER — ICOSAPENT ETHYL 1 G PO CAPS: 2.0000 | ORAL_CAPSULE | Freq: Two times a day (BID) | ORAL | 0 refills | Status: DC

## 2017-08-24 ENCOUNTER — Other Ambulatory Visit: Payer: BLUE CROSS/BLUE SHIELD

## 2017-08-24 DIAGNOSIS — Z1212 Encounter for screening for malignant neoplasm of rectum: Secondary | ICD-10-CM

## 2017-08-26 LAB — FECAL OCCULT BLOOD, IMMUNOCHEMICAL: Fecal Occult Bld: NEGATIVE

## 2017-08-28 ENCOUNTER — Other Ambulatory Visit: Payer: Self-pay | Admitting: Family Medicine

## 2017-08-29 ENCOUNTER — Other Ambulatory Visit: Payer: Self-pay | Admitting: Nurse Practitioner

## 2017-09-02 DIAGNOSIS — J069 Acute upper respiratory infection, unspecified: Secondary | ICD-10-CM | POA: Diagnosis not present

## 2017-09-06 ENCOUNTER — Encounter: Payer: Self-pay | Admitting: Physician Assistant

## 2017-09-06 ENCOUNTER — Ambulatory Visit: Payer: BLUE CROSS/BLUE SHIELD | Admitting: Physician Assistant

## 2017-09-06 VITALS — BP 108/75 | HR 59 | Temp 96.8°F | Ht 71.0 in | Wt 226.1 lb

## 2017-09-06 DIAGNOSIS — J069 Acute upper respiratory infection, unspecified: Secondary | ICD-10-CM

## 2017-09-06 MED ORDER — AMOXICILLIN 875 MG PO TABS: 875.0000 mg | ORAL_TABLET | Freq: Two times a day (BID) | ORAL | 0 refills | Status: DC

## 2017-09-06 NOTE — Patient Instructions (Signed)
Upper Respiratory Infection, Adult Most upper respiratory infections (URIs) are caused by a virus. A URI affects the nose, throat, and upper air passages. The most common type of URI is often called "the common cold." Follow these instructions at home:  Take medicines only as told by your doctor.  Gargle warm saltwater or take cough drops to comfort your throat as told by your doctor.  Use a warm mist humidifier or inhale steam from a shower to increase air moisture. This may make it easier to breathe.  Drink enough fluid to keep your pee (urine) clear or pale yellow.  Eat soups and other clear broths.  Have a healthy diet.  Rest as needed.  Go back to work when your fever is gone or your doctor says it is okay. ? You may need to stay home longer to avoid giving your URI to others. ? You can also wear a face mask and wash your hands often to prevent spread of the virus.  Use your inhaler more if you have asthma.  Do not use any tobacco products, including cigarettes, chewing tobacco, or electronic cigarettes. If you need help quitting, ask your doctor. Contact a doctor if:  You are getting worse, not better.  Your symptoms are not helped by medicine.  You have chills.  You are getting more short of breath.  You have brown or red mucus.  You have yellow or brown discharge from your nose.  You have pain in your face, especially when you bend forward.  You have a fever.  You have puffy (swollen) neck glands.  You have pain while swallowing.  You have white areas in the back of your throat. Get help right away if:  You have very bad or constant: ? Headache. ? Ear pain. ? Pain in your forehead, behind your eyes, and over your cheekbones (sinus pain). ? Chest pain.  You have long-lasting (chronic) lung disease and any of the following: ? Wheezing. ? Long-lasting cough. ? Coughing up blood. ? A change in your usual mucus.  You have a stiff neck.  You have  changes in your: ? Vision. ? Hearing. ? Thinking. ? Mood. This information is not intended to replace advice given to you by your health care provider. Make sure you discuss any questions you have with your health care provider. Document Released: 09/21/2007 Document Revised: 12/06/2015 Document Reviewed: 07/10/2013 Elsevier Interactive Patient Education  2018 [employer] Inc[employer]  

## 2017-09-06 NOTE — Progress Notes (Signed)
  Subjective:     Patient ID: Melvin Roberts, male   DOB: 11/02/1960, 57 y.o.   MRN: [id number]  HPI Pt with 1 week hx of fatigue, malaise, and prod cough Seen at Urgent Care on Sat and given rx cough med Sx have continued despite med  Review of Systems  Constitutional: Positive for activity change, appetite change and fatigue. Negative for chills, diaphoresis, fever and unexpected weight change.  HENT: Positive for congestion and postnasal drip. Negative for ear pain, facial swelling, nosebleeds, rhinorrhea, sinus pressure, sinus pain, sneezing, sore throat and voice change.   Eyes: Negative.   Respiratory: Positive for cough. Negative for shortness of breath and wheezing.   Cardiovascular: Negative.        Objective:   Physical Exam  Constitutional: He appears well-developed and well-nourished. No distress.  HENT:  Right Ear: External ear normal.  Left Ear: External ear normal.  Mouth/Throat: Oropharynx is clear and moist. No oropharyngeal exudate.  No sinus TTP Ear canals and TM's nl  Neck: Neck supple.  Cardiovascular: Normal rate, regular rhythm and normal heart sounds.  Pulmonary/Chest: Effort normal.  Coarse lungs sounds that clear with cough  Lymphadenopathy:    He has no cervical adenopathy.  Skin: He is not diaphoretic.  Nursing note and vitals reviewed.      Assessment:     1. Upper respiratory tract infection, unspecified type        Plan:     Fluids Rest Work note Cough med prn Amox 875 mg bid x 10 days F/U prn

## 2017-11-20 ENCOUNTER — Other Ambulatory Visit: Payer: Self-pay | Admitting: Family Medicine

## 2017-11-24 ENCOUNTER — Other Ambulatory Visit: Payer: Self-pay | Admitting: Family Medicine

## 2017-11-29 ENCOUNTER — Encounter: Payer: Self-pay | Admitting: Family Medicine

## 2017-11-29 ENCOUNTER — Ambulatory Visit: Payer: BLUE CROSS/BLUE SHIELD | Admitting: Family Medicine

## 2017-11-29 VITALS — BP 113/72 | HR 70 | Temp 97.2°F | Ht 71.0 in | Wt 227.8 lb

## 2017-11-29 DIAGNOSIS — J320 Chronic maxillary sinusitis: Secondary | ICD-10-CM

## 2017-11-29 MED ORDER — AMOXICILLIN-POT CLAVULANATE 875-125 MG PO TABS: 1.0000 | ORAL_TABLET | Freq: Two times a day (BID) | ORAL | 0 refills | Status: DC

## 2017-11-29 NOTE — Progress Notes (Signed)
BP 113/72   Pulse 70   Temp (!) 97.2 F (36.2 C) (Oral)   Ht 5\' 11"  (1.803 m)   Wt 227 lb 12.8 oz (103.3 kg)   SpO2 96%   BMI 31.77 kg/m    Subjective:    Patient ID: Melvin EldersMike Montante, male    DOB: 04/22/1960, 57 y.o.   MRN: [id number]  HPI: Melvin Roberts is a 57 y.o. male presenting on 11/29/2017 for Cough (x 3 days- otc mucinex); Nasal Congestion; Headache; and Fatigue   HPI Cough congestion headache and fatigue and sinus pressure Patient comes in complaining of 3 days worth of sinus pressure and congestion and headache that is similar to what he had previously.  He has been using Mucinex and Flonase and Tessalon Perles and nasal saline sprays and does not feel like it is getting better and feels like it is worsening.  He has fatigue and achiness that started to develop over the past day.  Has a cough that is productive of yellow-green sputum.  Relevant past medical, surgical, family and social history reviewed and updated as indicated. Interim medical history since our last visit reviewed. Allergies and medications reviewed and updated.  Review of Systems  Constitutional: Positive for fatigue. Negative for chills and fever.  HENT: Positive for congestion, postnasal drip, rhinorrhea, sinus pressure, sneezing and sore throat. Negative for ear discharge, ear pain and voice change.   Eyes: Negative for pain, discharge, redness and visual disturbance.  Respiratory: Positive for cough. Negative for shortness of breath and wheezing.   Cardiovascular: Negative for chest pain and leg swelling.  Musculoskeletal: Negative for back pain and gait problem.  Skin: Negative for rash.  Neurological: Positive for headaches.  All other systems reviewed and are negative.   Per HPI unless specifically indicated above   Allergies as of 11/29/2017      Reactions   Lipitor [atorvastatin]    Niaspan [niacin Er]       Medication List        Accurate as of 11/29/17 10:36 AM. Always use your  most recent med list.          amoxicillin-clavulanate 875-125 MG tablet Commonly known as:  AUGMENTIN Take 1 tablet by mouth 2 (two) times daily.   benzonatate 200 MG capsule Commonly known as:  TESSALON TAKE 1 CAPSULE BY MOUTH EVERY DAY 3 TIMES A DAY AS NEEDED   cholecalciferol 1000 units tablet Commonly known as:  VITAMIN D Take 1,000 Units by mouth daily.   fenofibrate 145 MG tablet Commonly known as:  TRICOR Take 1 tablet (145 mg total) by mouth daily.   FISH OIL PO Take by mouth.   fluticasone 50 MCG/ACT nasal spray Commonly known as:  FLONASE One to 2 sprays each nostril daily   lisinopril 10 MG tablet Commonly known as:  PRINIVIL,ZESTRIL TAKE 1 TABLET (10 MG TOTAL) BY MOUTH DAILY.   lithium 300 MG tablet TAKE 1 TABLET (300 MG TOTAL) BY MOUTH 3 (THREE) TIMES DAILY.   metaxalone 800 MG tablet Commonly known as:  SKELAXIN TAKE 1 TABLET BY MOUTH THREE TIMES A DAY   omeprazole 20 MG capsule Commonly known as:  PRILOSEC Take 20 mg by mouth daily.   VASCEPA 1 g Caps Generic drug:  Icosapent Ethyl TAKE 2 CAPSULES (2 G TOTAL) BY MOUTH 2 (TWO) TIMES DAILY.          Objective:    BP 113/72   Pulse 70   Temp (!) 97.2 F (36.2  C) (Oral)   Ht 5\' 11"  (1.803 m)   Wt 227 lb 12.8 oz (103.3 kg)   SpO2 96%   BMI 31.77 kg/m   Wt Readings from Last 3 Encounters:  11/29/17 227 lb 12.8 oz (103.3 kg)  09/06/17 226 lb 2 oz (102.6 kg)  08/21/17 225 lb (102.1 kg)    Physical Exam  Constitutional: He is oriented to person, place, and time. He appears well-developed and well-nourished. No distress.  HENT:  Right Ear: Tympanic membrane, external ear and ear canal normal.  Left Ear: Tympanic membrane, external ear and ear canal normal.  Nose: Mucosal edema present. No rhinorrhea or sinus tenderness. No epistaxis. Right sinus exhibits maxillary sinus tenderness. Right sinus exhibits no frontal sinus tenderness. Left sinus exhibits maxillary sinus tenderness. Left sinus  exhibits no frontal sinus tenderness.  Mouth/Throat: Uvula is midline and mucous membranes are normal. Posterior oropharyngeal edema present. No oropharyngeal exudate, posterior oropharyngeal erythema or tonsillar abscesses.  Eyes: Conjunctivae are normal. Right eye exhibits no discharge. No scleral icterus.  Neck: Neck supple. No thyromegaly present.  Cardiovascular: Normal rate, regular rhythm, normal heart sounds and intact distal pulses.  No murmur heard. Pulmonary/Chest: Effort normal and breath sounds normal. No respiratory distress. He has no wheezes. He has no rales.  Musculoskeletal: Normal range of motion. He exhibits no edema.  Lymphadenopathy:    He has no cervical adenopathy.  Neurological: He is alert and oriented to person, place, and time. Coordination normal.  Skin: Skin is warm and dry. No rash noted. He is not diaphoretic.  Psychiatric: He has a normal mood and affect. His behavior is normal.  Nursing note and vitals reviewed.       Assessment & Plan:   Problem List Items Addressed This Visit      Respiratory   Sinusitis, chronic - Primary   Relevant Medications   amoxicillin-clavulanate (AUGMENTIN) 875-125 MG tablet       Follow up plan: Return if symptoms worsen or fail to improve.  Counseling provided for all of the vaccine components No orders of the defined types were placed in this encounter.   Arville CareJoshua Ilija Maxim, MD [facility] 11/29/2017, 10:36 AM

## 2018-02-17 ENCOUNTER — Encounter: Payer: Self-pay | Admitting: Pediatrics

## 2018-02-17 ENCOUNTER — Ambulatory Visit: Payer: BLUE CROSS/BLUE SHIELD | Admitting: Pediatrics

## 2018-02-17 VITALS — BP 110/74 | HR 54 | Temp 97.1°F | Ht 71.0 in | Wt 228.0 lb

## 2018-02-17 DIAGNOSIS — J0111 Acute recurrent frontal sinusitis: Secondary | ICD-10-CM | POA: Diagnosis not present

## 2018-02-17 MED ORDER — AMOXICILLIN-POT CLAVULANATE 875-125 MG PO TABS: 1.0000 | ORAL_TABLET | Freq: Two times a day (BID) | ORAL | 0 refills | Status: DC

## 2018-02-17 NOTE — Progress Notes (Signed)
  Subjective:   Patient ID: Melvin Roberts, male    DOB: November 19, 1960, 57 y.o.   MRN: [id number] CC: Sinusitis (left sided maxillary and frontal facial pressure/pain for several weeks. Has this 3 times a year and doesn't take anything OTC. Has seen an ENT and was told that he has narrowed sinus passages )  HPI: Melvin Roberts is a 57 y.o. male   Often symptoms go away with time, this time they seem to be lingering, sometimes getting worse.  No fevers.  Appetite is been fine.  Pain has been getting worse, especially over left eyebrow, left side of his nose.  Has tried sinus rinses, Flonase, antihistamine in the past.  Not regularly doing sinus rinses now, has been taking nasal steroid spray.  Symptoms tend to get worse when his allergies flare, especially bad in spring.  Relevant past medical, surgical, family and social history reviewed. Allergies and medications reviewed and updated. Social History   Tobacco Use  Smoking Status Never Smoker  Smokeless Tobacco Never Used   ROS: Per HPI   Objective:    BP 110/74 (BP Location: Left Arm, Patient Position: Sitting, Cuff Size: Large)   Pulse (!) 54   Temp (!) 97.1 F (36.2 C) (Oral)   Ht 5\' 11"  (1.803 m)   Wt 228 lb (103.4 kg)   BMI 31.80 kg/m   Wt Readings from Last 3 Encounters:  02/17/18 228 lb (103.4 kg)  11/29/17 227 lb 12.8 oz (103.3 kg)  09/06/17 226 lb 2 oz (102.6 kg)    Gen: NAD, alert, cooperative with exam, NCAT EYES: EOMI, no conjunctival injection, or no icterus ENT:  TMs dull gray b/l, OP with mild erythema, ttp L max sinus and over L over LYMPH: no cervical LAD CV: NRRR, normal S1/S2, no murmur, distal pulses 2+ b/l Resp: CTABL, no wheezes, normal WOB Abd: +BS, soft, NTND.  Ext: No edema, warm Neuro: Alert and oriented, strength equal b/l UE and LE, coordination grossly normal MSK: normal muscle bulk  Assessment & Plan:  Melvin Roberts was seen today for sinusitis.  Diagnoses and all orders for this visit:  Acute  recurrent frontal sinusitis Continue symptom care, sinus rinses.  May benefit from seeing allergist in future.  Declines referral for now.  Treat with below. -     amoxicillin-clavulanate (AUGMENTIN) 875-125 MG tablet; Take 1 tablet by mouth 2 (two) times daily.   Follow up plan: Return if symptoms worsen or fail to improve. Melvin Kras, MD Melvin Roberts [facility]

## 2018-02-18 ENCOUNTER — Other Ambulatory Visit: Payer: Self-pay | Admitting: Family Medicine

## 2018-02-19 NOTE — Telephone Encounter (Signed)
OV 03/05/18

## 2018-03-05 ENCOUNTER — Ambulatory Visit: Payer: BLUE CROSS/BLUE SHIELD | Admitting: Family Medicine

## 2018-03-05 ENCOUNTER — Encounter: Payer: Self-pay | Admitting: Family Medicine

## 2018-03-05 ENCOUNTER — Ambulatory Visit (INDEPENDENT_AMBULATORY_CARE_PROVIDER_SITE_OTHER): Payer: BLUE CROSS/BLUE SHIELD | Admitting: Family Medicine

## 2018-03-05 VITALS — BP 119/81 | HR 60 | Temp 97.3°F | Ht 71.0 in | Wt 234.0 lb

## 2018-03-05 DIAGNOSIS — I10 Essential (primary) hypertension: Secondary | ICD-10-CM | POA: Diagnosis not present

## 2018-03-05 DIAGNOSIS — E559 Vitamin D deficiency, unspecified: Secondary | ICD-10-CM

## 2018-03-05 DIAGNOSIS — N400 Enlarged prostate without lower urinary tract symptoms: Secondary | ICD-10-CM

## 2018-03-05 DIAGNOSIS — K219 Gastro-esophageal reflux disease without esophagitis: Secondary | ICD-10-CM | POA: Diagnosis not present

## 2018-03-05 DIAGNOSIS — F439 Reaction to severe stress, unspecified: Secondary | ICD-10-CM

## 2018-03-05 DIAGNOSIS — E7800 Pure hypercholesterolemia, unspecified: Secondary | ICD-10-CM | POA: Diagnosis not present

## 2018-03-05 DIAGNOSIS — Z0000 Encounter for general adult medical examination without abnormal findings: Secondary | ICD-10-CM

## 2018-03-05 DIAGNOSIS — F3170 Bipolar disorder, currently in remission, most recent episode unspecified: Secondary | ICD-10-CM

## 2018-03-05 NOTE — Progress Notes (Signed)
Subjective:    Patient ID: Melvin Roberts, male    DOB: November 06, 1960, 57 y.o.   MRN: [id number]  HPI Patient is here today for annual wellness exam and follow up of chronic medical problems which includes hypertension. He is taking medication regularly.  The patient comes in today for complete physical.  He has no specific complaints.  He does take medicine for his blood pressure and his cholesterol as well as for his bipolar disorder.  He has hyperlipidemia and hypertension.  His vital signs are stable and unfortunately his weight is up 6 pounds since his last visit.  Patient is pleasant and doing well and has no complaints.  He says that his wife says that his behavior is appropriate and does not eating and adjustment in his medications.  He has put on 6 pounds of weight he plans to work on that.  He will have to work even harder because of the holiday season that is coming up.  Today he denies any chest pain pressure tightness or shortness of breath.  He denies any trouble with swallowing heartburn indigestion nausea vomiting diarrhea blood in the stool black tarry bowel movements or change in bowel habits.  He is passing his water without problems.  He recently saw the ophthalmologist and his prescription was changed and he is seeing better.     Patient Active Problem List   Diagnosis Date Noted  . Hypertension 09/16/2013  . Bipolar disorder (HCC) 03/11/2013  . Allergic rhinitis 03/11/2013  . BPH (benign prostatic hyperplasia) 03/11/2013  . Hyperlipidemia 05/29/2007  . DEPRESSION, CHRONIC 05/29/2007  . Sinusitis, chronic 05/29/2007  . ESOPHAGEAL STRICTURE 05/29/2007  . GERD 05/29/2007  . HIATAL HERNIA 05/29/2007  . LOW BACK PAIN, CHRONIC 05/29/2007   Outpatient Encounter Medications as of 03/05/2018  Medication Sig  . cholecalciferol (VITAMIN D) 1000 UNITS tablet Take 1,000 Units by mouth daily.  . fenofibrate (TRICOR) 145 MG tablet Take 1 tablet (145 mg total) by mouth daily.  .  fluticasone (FLONASE) 50 MCG/ACT nasal spray One to 2 sprays each nostril daily  . lisinopril (PRINIVIL,ZESTRIL) 10 MG tablet TAKE 1 TABLET BY MOUTH EVERY DAY  . lithium 300 MG tablet TAKE 1 TABLET (300 MG TOTAL) BY MOUTH 3 (THREE) TIMES DAILY.  . metaxalone (SKELAXIN) 800 MG tablet TAKE 1 TABLET BY MOUTH THREE TIMES A DAY  . Omega-3 Fatty Acids (FISH OIL PO) Take by mouth.  Marland Kitchen omeprazole (PRILOSEC) 20 MG capsule Take 20 mg by mouth daily.  Marland Kitchen VASCEPA 1 g CAPS TAKE 2 CAPSULES (2 G TOTAL) BY MOUTH 2 (TWO) TIMES DAILY.  . [DISCONTINUED] amoxicillin-clavulanate (AUGMENTIN) 875-125 MG tablet Take 1 tablet by mouth 2 (two) times daily.   No facility-administered encounter medications on file as of 03/05/2018.      Review of Systems  Constitutional: Negative.   HENT: Negative.   Eyes: Negative.   Respiratory: Negative.   Cardiovascular: Negative.   Gastrointestinal: Negative.   Endocrine: Negative.   Genitourinary: Negative.   Musculoskeletal: Negative.   Skin: Negative.   Allergic/Immunologic: Negative.   Neurological: Negative.   Hematological: Negative.   Psychiatric/Behavioral: Negative.        Objective:   Physical Exam  Constitutional: He is oriented to person, place, and time. He appears well-developed and well-nourished. No distress.  The patient is pleasant and alert and apologizes for his weight gain.  HENT:  Head: Normocephalic and atraumatic.  Right Ear: External ear normal.  Left Ear: External ear normal.  Nose: Nose normal.  Mouth/Throat: Oropharynx is clear and moist. No oropharyngeal exudate.  Eyes: Pupils are equal, round, and reactive to light. Conjunctivae and EOM are normal. Right eye exhibits no discharge. Left eye exhibits no discharge. No scleral icterus.  Just saw ophthalmologist  Neck: Normal range of motion. Neck supple. No thyromegaly present.  No bruits thyromegaly or anterior cervical adenopathy  Cardiovascular: Normal rate, regular rhythm, normal  heart sounds and intact distal pulses.  No murmur heard. The heart is regular at 72/min with good pedal pulses and no edema  Pulmonary/Chest: Effort normal and breath sounds normal. He has no wheezes. He has no rales. He exhibits no tenderness.  Clear anteriorly and posteriorly with no axillary adenopathy or chest wall masses  Abdominal: Soft. Bowel sounds are normal. He exhibits no mass. There is no tenderness.  The abdomen is soft without liver or spleen enlargement epigastric or suprapubic tenderness masses or bruits.  No inguinal adenopathy  Genitourinary: Rectum normal and penis normal.  Genitourinary Comments: The prostate is smooth and slightly enlarged without lumps or masses.  There are no rectal masses.  External genitalia were within normal limits and no inguinal hernias were palpable and no inguinal adenopathy was present.  Musculoskeletal: Normal range of motion. He exhibits no edema.  Lymphadenopathy:    He has no cervical adenopathy.  Neurological: He is alert and oriented to person, place, and time. He has normal reflexes. No cranial nerve deficit.  Skin: Skin is warm and dry. No rash noted.  Psychiatric: He has a normal mood and affect. His behavior is normal. Judgment and thought content normal.  The patient's mood affect and behavior were all stable and normal compared to past visits.  Nursing note and vitals reviewed.   BP 119/81 (BP Location: Left Arm)   Pulse 60   Temp (!) 97.3 F (36.3 C) (Oral)   Ht 5\' 11"  (1.803 m)   Wt 234 lb (106.1 kg)   BMI 32.64 kg/m        Assessment & Plan:  1. Pure hypercholesterolemia -Continue with fast sleep and fenofibrate.  Pending results of lab work  2. Annual physical exam -Lab work pending and will call patient with results as soon as that is returned  3. Vitamin D deficiency -Continue vitamin D replacement pending results of lab work  4. Benign prostatic hyperplasia, unspecified whether lower urinary tract symptoms  present -Patient is having no symptoms with prostate enlargement  5. Essential hypertension, benign -Blood pressure is good today and he will continue with current treatment  6. Gastroesophageal reflux disease, esophagitis presence not specified -Continue with omeprazole and close adherence to diet and anything that would be causing adverse effects on his GI tract  7. Situational stress -This seems to be stable currently  8. Bipolar affective disorder in remission (HCC) -Continue with lithium and check level as we usually do with this.  Patient Instructions  Continue current medications. Continue good therapeutic lifestyle changes which include good diet and exercise. Fall precautions discussed with patient. If an FOBT was given today- please return it to our front desk. If you are over 675 years old - you may need Prevnar 13 or the adult Pneumonia vaccine.  **Flu shots are available--- please call and schedule a FLU-CLINIC appointment**  After your visit with us today you will receive a survey in the mail or online from [employer] regarding your care with us. Please take a moment to fill this out. Your feedback is very  important to [place] as you can help [place] better understand your patient needs as well as improve your experience and satisfaction. WE CARE ABOUT YOU!!!   Make every effort possible to lose as much weight as possible. If you need any nutrition guidance please call back and schedule visit with our clinical pharmacist Drink plenty of water and less drinks Stay well-hydrated We will call with blood work results as soon as those results become available  Nyra Capes MD

## 2018-03-05 NOTE — Patient Instructions (Addendum)
Continue current medications. Continue good therapeutic lifestyle changes which include good diet and exercise. Fall precautions discussed with patient. If an FOBT was given today- please return it to our front desk. If you are over 57 years old - you may need Prevnar 13 or the adult Pneumonia vaccine.  **Flu shots are available--- please call and schedule a FLU-CLINIC appointment**  After your visit with us today you will receive a survey in the mail or online from [employer] regarding your care with us. Please take a moment to fill this out. Your feedback is very important to us as you can help us better understand your patient needs as well as improve your experience and satisfaction. WE CARE ABOUT YOU!!!   Make every effort possible to lose as much weight as possible. If you need any nutrition guidance please call back and schedule visit with our clinical pharmacist Drink plenty of water and less drinks Stay well-hydrated We will call with blood work results as soon as those results become available

## 2018-03-06 ENCOUNTER — Telehealth: Payer: Self-pay | Admitting: Family Medicine

## 2018-03-06 LAB — LIPID PANEL
CHOL/HDL RATIO: 6.3 ratio — AB (ref 0.0–5.0)
Cholesterol, Total: 203 mg/dL — ABNORMAL HIGH (ref 100–199)
HDL: 32 mg/dL — AB (ref >39)
Triglycerides: 551 mg/dL (ref 0–149)

## 2018-03-06 LAB — BMP8+EGFR
BUN / CREAT RATIO: 13 (ref 9–20)
BUN: 14 mg/dL (ref 6–24)
CALCIUM: 9.7 mg/dL (ref 8.7–10.2)
CO2: 21 mmol/L (ref 20–29)
CREATININE: 1.09 mg/dL (ref 0.76–1.27)
Chloride: 101 mmol/L (ref 96–106)
GFR, EST AFRICAN AMERICAN: 87 mL/min/{1.73_m2} (ref >59)
GFR, EST NON AFRICAN AMERICAN: 75 mL/min/{1.73_m2} (ref >59)
Glucose: 80 mg/dL (ref 65–99)
Potassium: 4.4 mmol/L (ref 3.5–5.2)
Sodium: 136 mmol/L (ref 134–144)

## 2018-03-06 LAB — CBC WITH DIFFERENTIAL/PLATELET
BASOS: 1 %
Basophils Absolute: 0.1 10*3/uL (ref 0.0–0.2)
EOS (ABSOLUTE): 0.1 10*3/uL (ref 0.0–0.4)
EOS: 1 %
HEMATOCRIT: 36.6 % — AB (ref 37.5–51.0)
HEMOGLOBIN: 12.5 g/dL — AB (ref 13.0–17.7)
IMMATURE GRANS (ABS): 0 10*3/uL (ref 0.0–0.1)
Immature Granulocytes: 0 %
LYMPHS: 24 %
Lymphocytes Absolute: 2.6 10*3/uL (ref 0.7–3.1)
MCH: 30.2 pg (ref 26.6–33.0)
MCHC: 34.2 g/dL (ref 31.5–35.7)
MCV: 88 fL (ref 79–97)
MONOCYTES: 6 %
Monocytes Absolute: 0.6 10*3/uL (ref 0.1–0.9)
NEUTROS ABS: 7.3 10*3/uL — AB (ref 1.4–7.0)
Neutrophils: 68 %
Platelets: 295 10*3/uL (ref 150–450)
RBC: 4.14 x10E6/uL (ref 4.14–5.80)
RDW: 13.3 % (ref 12.3–15.4)
WBC: 10.8 10*3/uL (ref 3.4–10.8)

## 2018-03-06 LAB — HEPATIC FUNCTION PANEL
ALT: 29 IU/L (ref 0–44)
AST: 27 IU/L (ref 0–40)
Albumin: 4.4 g/dL (ref 3.5–5.5)
Alkaline Phosphatase: 69 IU/L (ref 39–117)
Bilirubin Total: 0.2 mg/dL (ref 0.0–1.2)
Bilirubin, Direct: 0.1 mg/dL (ref 0.00–0.40)
TOTAL PROTEIN: 7.2 g/dL (ref 6.0–8.5)

## 2018-03-06 LAB — LITHIUM LEVEL: LITHIUM LVL: 0.8 mmol/L (ref 0.6–1.2)

## 2018-03-06 LAB — VITAMIN D 25 HYDROXY (VIT D DEFICIENCY, FRACTURES): VIT D 25 HYDROXY: 26 ng/mL — AB (ref 30.0–100.0)

## 2018-03-06 NOTE — Telephone Encounter (Signed)
lmtcb

## 2018-03-07 ENCOUNTER — Telehealth: Payer: Self-pay | Admitting: Family Medicine

## 2018-03-07 NOTE — Telephone Encounter (Signed)
Patient notified. See lab result note 

## 2018-03-07 NOTE — Telephone Encounter (Signed)
Patient notified of lab results. See lab result note 

## 2018-03-12 ENCOUNTER — Other Ambulatory Visit: Payer: Self-pay | Admitting: Family Medicine

## 2018-03-27 DIAGNOSIS — M2241 Chondromalacia patellae, right knee: Secondary | ICD-10-CM | POA: Diagnosis not present

## 2018-04-27 ENCOUNTER — Encounter: Payer: Self-pay | Admitting: Family Medicine

## 2018-04-27 ENCOUNTER — Ambulatory Visit: Payer: BLUE CROSS/BLUE SHIELD | Admitting: Family Medicine

## 2018-04-27 VITALS — BP 113/73 | HR 62 | Temp 97.1°F | Ht 71.0 in | Wt 229.0 lb

## 2018-04-27 DIAGNOSIS — J069 Acute upper respiratory infection, unspecified: Secondary | ICD-10-CM | POA: Diagnosis not present

## 2018-04-27 NOTE — Progress Notes (Signed)
    Subjective:     Melvin Roberts is a 58 y.o. male who presents for evaluation of symptoms of a URI. Symptoms include congestion, no  fever, post nasal drip and productive cough with  yellow colored sputum. Onset of symptoms was 5 days ago, and has been gradually improving since that time. Treatment to date: ZiCam.  The following portions of the patient's history were reviewed and updated as appropriate: allergies, current medications, past family history, past medical history, past social history, past surgical history and problem list.  Review of Systems Constitutional: positive for chills and malaise Eyes: negative Ears, nose, mouth, throat, and face: positive for nasal congestion Respiratory: positive for cough and sputum Cardiovascular: negative Genitourinary:negative Musculoskeletal:positive for myalgias Neurological: positive for headaches   Objective:    BP 113/73   Pulse 62   Temp (!) 97.1 F (36.2 C) (Oral)   Ht 5\' 11"  (1.803 m)   Wt 229 lb (103.9 kg)   BMI 31.94 kg/m  General appearance: alert, cooperative, appears stated age and no distress Head: Normocephalic, without obvious abnormality, atraumatic Eyes: negative Ears: normal TM's and external ear canals both ears Nose: clear discharge, mild congestion, no sinus tenderness Throat: normal findings: lips normal without lesions, buccal mucosa normal and oropharynx pink & moist without lesions or evidence of thrush Neck: no adenopathy, no carotid bruit, no JVD, supple, symmetrical, trachea midline and thyroid not enlarged, symmetric, no tenderness/mass/nodules Lungs: clear to auscultation bilaterally Heart: regular rate and rhythm, S1, S2 normal, no murmur, click, rub or gallop Abdomen: soft, non-tender; bowel sounds normal; no masses,  no organomegaly Skin: Skin color, texture, turgor normal. No rashes or lesions Lymph nodes: Cervical, supraclavicular, and axillary nodes normal. Neurologic: Grossly normal    Assessment:     Melvin Roberts was seen today for uri.  Diagnoses and all orders for this visit:  Viral upper respiratory tract infection Symptomatic care discussed. Increase fluid intake and humidity in the air. Can use mucinex if beneficial. Tylenol or motrin as needed for fever and pain control. Report any new or worsening symptoms.     Plan:    Discussed diagnosis and treatment of URI. Discussed the importance of avoiding unnecessary antibiotic therapy. Suggested symptomatic OTC remedies. Nasal saline spray for congestion. Follow up as needed. Call in 4 days if symptoms aren't resolving.    The above assessment and management plan was discussed with the patient. The patient verbalized understanding of and has agreed to the management plan. Patient is aware to call the clinic if symptoms fail to improve or worsen. Patient is aware when to return to the clinic for a follow-up visit. Patient educated on when it is appropriate to go to the emergency department.   Kari Baars, FNP-C [facility] [address] [place], [place] [address] [phone]

## 2018-04-27 NOTE — Patient Instructions (Addendum)
It appears that you have a viral upper respiratory infection (cold).  Cold symptoms can last up to 2 weeks.    - Get plenty of rest and drink plenty of fluids. - Try to breathe moist air. Use a cold mist humidifier. - Consume warm fluids (soup or tea) to provide relief for a stuffy nose and to loosen phlegm. - For cough and congestion you can use plain Mucinex, regular or max strength, follow box directions.  - For nasal stuffiness, try saline nasal spray or a Neti Pot. You can use saline nasal spray 4 times daily. Do not use tap water in the Neti Pot, follow instructions on box for proper use. Afrin nasal spray can also be used but this product should not be used longer than 3 days or it will cause rebound nasal stuffiness (worsening nasal congestion). - For sore throat pain relief: use chloraseptic spray, suck on throat lozenges, hard candy or popsicles; gargle with warm salt water (1/4 tsp. salt per 8 oz. of water); and eat soft, bland foods. - For fever or aches and pains take tylenol or motrin as appropriate for age and weight.  - Eat a well-balanced diet. If you cannot, ensure you are getting enough nutrients by taking a daily multivitamin. - Avoid dairy products, as they can thicken phlegm. - Avoid alcohol, as it impairs your body's immune system. - Change your toothbrush in 3 days.   CONTACT YOUR DOCTOR IF YOU EXPERIENCE ANY OF THE FOLLOWING: - High fever - Ear pain - Sinus-type headache - Unusually severe cold symptoms - Cough that gets worse while other cold symptoms improve - Flare up of any chronic lung problem, such as asthma - Your symptoms persist longer than 2 weeks   

## 2018-05-19 ENCOUNTER — Other Ambulatory Visit: Payer: Self-pay | Admitting: Family Medicine

## 2018-06-03 ENCOUNTER — Other Ambulatory Visit: Payer: Self-pay | Admitting: Family Medicine

## 2018-07-13 DIAGNOSIS — M2241 Chondromalacia patellae, right knee: Secondary | ICD-10-CM | POA: Diagnosis not present

## 2018-08-28 ENCOUNTER — Other Ambulatory Visit: Payer: Self-pay | Admitting: Family Medicine

## 2018-08-31 ENCOUNTER — Telehealth: Payer: Self-pay | Admitting: Family Medicine

## 2018-09-03 ENCOUNTER — Other Ambulatory Visit: Payer: Self-pay

## 2018-09-03 ENCOUNTER — Ambulatory Visit (INDEPENDENT_AMBULATORY_CARE_PROVIDER_SITE_OTHER): Payer: BLUE CROSS/BLUE SHIELD | Admitting: Family Medicine

## 2018-09-03 ENCOUNTER — Encounter: Payer: Self-pay | Admitting: Family Medicine

## 2018-09-03 ENCOUNTER — Other Ambulatory Visit: Payer: Self-pay | Admitting: Family Medicine

## 2018-09-03 DIAGNOSIS — K219 Gastro-esophageal reflux disease without esophagitis: Secondary | ICD-10-CM

## 2018-09-03 DIAGNOSIS — E559 Vitamin D deficiency, unspecified: Secondary | ICD-10-CM

## 2018-09-03 DIAGNOSIS — F3170 Bipolar disorder, currently in remission, most recent episode unspecified: Secondary | ICD-10-CM

## 2018-09-03 DIAGNOSIS — I10 Essential (primary) hypertension: Secondary | ICD-10-CM | POA: Diagnosis not present

## 2018-09-03 DIAGNOSIS — N400 Enlarged prostate without lower urinary tract symptoms: Secondary | ICD-10-CM

## 2018-09-03 DIAGNOSIS — E782 Mixed hyperlipidemia: Secondary | ICD-10-CM

## 2018-09-03 DIAGNOSIS — Z1211 Encounter for screening for malignant neoplasm of colon: Secondary | ICD-10-CM

## 2018-09-03 NOTE — Patient Instructions (Signed)
Continue to practice good hand and respiratory hygiene Drink plenty of water and fluids Come to the lab and let my nurse know when you are coming so they can get your lab work done At your next visit you will need to have a rectal exam and a PSA Watch sodium intake and make every effort through diet and exercise to reduce weight

## 2018-09-03 NOTE — Progress Notes (Signed)
Virtual Visit Via telephone Note I connected with@ on 09/03/18 by telephone and verified that I am speaking with the correct person or authorized healthcare agent using two identifiers. Melvin Roberts is currently located at home and there are no unauthorized people in close proximity. I completed this visit while in a private location in my home office.  This visit type was conducted due to national recommendations for restrictions regarding the COVID-19 Pandemic (e.g. social distancing).  This format is felt to be most appropriate for this patient at this time.  All issues noted in this document were discussed and addressed.  No physical exam was performed.    I discussed the limitations, risks, security and privacy concerns of performing an evaluation and management service by telephone and the availability of in person appointments. I also discussed with the patient that there may be a patient responsible charge related to this service. The patient expressed understanding and agreed to proceed.   Date:  09/03/2018    ID:  [name]      08-12-1960        [id number]   Patient Care Team Patient Care Team: Ernestina Penna, MD as PCP - General (Family Medicine)  Reason for Visit: Primary Care Follow-up     History of Present Illness & Review of Systems:     Melvin Roberts is a 57 y.o. year old male primary care patient that presents today for a telehealth visit.  The patient is alert and responded appropriately to questions asked of him.  Today he denies any chest pain pressure tightness or shortness of breath.  He denies any trouble with swallowing heartburn indigestion nausea vomiting diarrhea blood in the stool or black tarry bowel movements.  He is up-to-date on his colonoscopies and was told when he had the last one that he did not need to have another one for 10 years.  I reminded the patient he should always check for any changes in bowel habits or blood in  the stool and if he sees any he should get back in touch with [place].  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  He is under stress at work but no more than usual.  Review of systems as stated, otherwise negative.  The patient does not have symptoms concerning for COVID-19 infection (fever, chills, cough, or new shortness of breath).      Current Medications (Verified) Allergies as of 09/03/2018      Reactions   Lipitor [atorvastatin]    Niaspan [niacin Er]       Medication List       Accurate as of Sep 03, 2018 10:40 AM. If you have any questions, ask your nurse or doctor.        cholecalciferol 1000 units tablet Commonly known as:  VITAMIN D Take 1,000 Units by mouth daily.   fenofibrate 145 MG tablet Commonly known as:  TRICOR Take 1 tablet (145 mg total) by mouth daily.   FISH OIL PO Take by mouth.   lisinopril 10 MG tablet Commonly known as:  ZESTRIL TAKE 1 TABLET BY MOUTH EVERY DAY   lithium 300 MG tablet TAKE 1 TABLET (300 MG TOTAL) BY MOUTH 3 (THREE) TIMES DAILY.   metaxalone 800 MG tablet Commonly known as:  SKELAXIN TAKE 1 TABLET BY MOUTH THREE TIMES A DAY   omeprazole 20 MG capsule Commonly known as:  PRILOSEC Take 20 mg by mouth daily.   Vascepa  1 g Caps Generic drug:  Icosapent Ethyl TAKE 2 CAPSULES (2 G TOTAL) BY MOUTH 2 (TWO) TIMES DAILY.           Allergies (Verified)    Lipitor [atorvastatin] and Niaspan [niacin er]  Past Medical History Past Medical History:  Diagnosis Date  . Bipolar 1 disorder (HCC)   . Hyperlipidemia   . Hypertension      Past Surgical History:  Procedure Laterality Date  . BACK SURGERY  11/17/95   Dr. Shelle IronBeane  . EYE SURGERY    . HERNIA REPAIR  2009   umbilical  . UPPER GASTROINTESTINAL ENDOSCOPY  x2   w/ esophageal dilation    Social History   Socioeconomic History  . Marital status: Married    Spouse name: Not on file  . Number of children: Not on file  . Years of education: Not on file  . Highest  education level: Not on file  Occupational History  . Not on file  Social Needs  . Financial resource strain: Not on file  . Food insecurity:    Worry: Not on file    Inability: Not on file  . Transportation needs:    Medical: Not on file    Non-medical: Not on file  Tobacco Use  . Smoking status: Never Smoker  . Smokeless tobacco: Never Used  Substance and Sexual Activity  . Alcohol use: No  . Drug use: No  . Sexual activity: Not on file  Lifestyle  . Physical activity:    Days per week: Not on file    Minutes per session: Not on file  . Stress: Not on file  Relationships  . Social connections:    Talks on phone: Not on file    Gets together: Not on file    Attends religious service: Not on file    Active member of club or organization: Not on file    Attends meetings of clubs or organizations: Not on file    Relationship status: Not on file  Other Topics Concern  . Not on file  Social History Narrative  . Not on file     Family History  Problem Relation Age of Onset  . Cancer Mother        breast  . Cancer Father        lug  . Cancer Maternal Grandfather   . Cancer Paternal Grandfather   . Colon cancer Neg Hx       Labs/Other Tests and Data Reviewed:    Wt Readings from Last 3 Encounters:  04/27/18 229 lb (103.9 kg)  03/05/18 234 lb (106.1 kg)  02/17/18 228 lb (103.4 kg)   Temp Readings from Last 3 Encounters:  04/27/18 (!) 97.1 F (36.2 C) (Oral)  03/05/18 (!) 97.3 F (36.3 C) (Oral)  02/17/18 (!) 97.1 F (36.2 C) (Oral)   BP Readings from Last 3 Encounters:  04/27/18 113/73  03/05/18 119/81  02/17/18 110/74   Pulse Readings from Last 3 Encounters:  04/27/18 62  03/05/18 60  02/17/18 (!) 54     No results found for: HGBA1C Lab Results  Component Value Date   LDLCALC Comment 03/05/2018   CREATININE 1.09 03/05/2018       Chemistry      Component Value Date/Time   NA 136 03/05/2018 1657   K 4.4 03/05/2018 1657   CL 101  03/05/2018 1657   CO2 21 03/05/2018 1657   BUN 14 03/05/2018 1657   CREATININE 1.09 03/05/2018  1657      Component Value Date/Time   CALCIUM 9.7 03/05/2018 1657   ALKPHOS 69 03/05/2018 1657   AST 27 03/05/2018 1657   ALT 29 03/05/2018 1657   BILITOT 0.2 03/05/2018 1657         OBSERVATIONS/ OBJECTIVE:     The patient says that his weight is 230 pounds.  His blood pressure most recently was 110/70.  He has not been running any fever.  His temperament is good but he seems to bring some slight agitation home from work where it is more stressful.  He is alert and pleasant over the phone.  He understands that he will need to come to the office and get fasting blood work and he would also need a PSA and FOBT.  He would need his rectal exam this fall.  Physical exam deferred due to nature of telephonic visit.  ASSESSMENT & PLAN    Time:   Today, I have spent 22 minutes with the patient via telephone discussing the above including Covid precautions.     Visit Diagnoses: 1. Benign prostatic hyperplasia, unspecified whether lower urinary tract symptoms present -Is doing well currently with no problems passing his water.  2. Gastroesophageal reflux disease, esophagitis presence not specified -The omeprazole is doing well with controlling his reflux symptoms.  3. Bipolar affective disorder in remission [facility]) -Patient is under a lot of stress at work as usual but he is learning to live with this.  His wife says his temperament is stable at home.  4. Essential hypertension, benign -Blood pressures have been good and better since taking the lisinopril 10 mg daily.  5. Vitamin D deficiency -Continue with vitamin D replacement pending results of lab work  6. Mixed hyperlipidemia -Continue taking Vascepa and fenofibrate.  The patient has been intolerant to some of the statins.  He will continue with as aggressive therapeutic lifestyle changes as possible including diet and exercise to  achieve weight loss     The above assessment and management plan was discussed with the patient. The patient verbalized understanding of and has agreed to the management plan. Patient is aware to call the clinic if symptoms persist or worsen. Patient is aware when to return to the clinic for a follow-up visit. Patient educated on when it is appropriate to go to the emergency department.    Ernestina Penna, MD [facility] [address] [place], [place], [place] [address] Ph [phone]   Nyra Capes MD

## 2018-09-03 NOTE — Addendum Note (Signed)
Addended by: Magdalene River on: 09/03/2018 01:04 PM   Modules accepted: Orders

## 2018-09-30 DIAGNOSIS — Z20828 Contact with and (suspected) exposure to other viral communicable diseases: Secondary | ICD-10-CM | POA: Diagnosis not present

## 2018-10-16 DIAGNOSIS — Z1159 Encounter for screening for other viral diseases: Secondary | ICD-10-CM | POA: Diagnosis not present

## 2018-11-25 ENCOUNTER — Other Ambulatory Visit: Payer: Self-pay | Admitting: Family Medicine

## 2018-11-26 ENCOUNTER — Other Ambulatory Visit: Payer: Self-pay | Admitting: Family Medicine

## 2018-11-30 DIAGNOSIS — M545 Low back pain: Secondary | ICD-10-CM | POA: Diagnosis not present

## 2018-12-05 ENCOUNTER — Other Ambulatory Visit: Payer: Self-pay | Admitting: Family Medicine

## 2018-12-13 DIAGNOSIS — M545 Low back pain: Secondary | ICD-10-CM | POA: Diagnosis not present

## 2019-02-21 ENCOUNTER — Other Ambulatory Visit: Payer: Self-pay | Admitting: Physician Assistant

## 2019-03-01 ENCOUNTER — Other Ambulatory Visit: Payer: Self-pay | Admitting: Physician Assistant

## 2019-03-01 NOTE — Telephone Encounter (Signed)
I am sending one script in, patient needs to get established with new PCP

## 2019-03-01 NOTE — Telephone Encounter (Signed)
Mail box not set up needs to set up new pcp

## 2019-03-15 ENCOUNTER — Other Ambulatory Visit: Payer: Self-pay | Admitting: Physician Assistant

## 2019-03-18 NOTE — Telephone Encounter (Signed)
Former [place] 30 days given 02/21/19

## 2019-03-18 NOTE — Telephone Encounter (Signed)
Patient would like to est with Selinda Eon. Would like to know if he could do it as a tele visit due to [place] ? Please advise and send back to pools.

## 2019-03-20 NOTE — Telephone Encounter (Signed)
He needs repeat labs, last labs 02/2018.

## 2019-03-20 NOTE — Telephone Encounter (Signed)
Patient does not have [facility] set up

## 2019-04-15 ENCOUNTER — Encounter: Payer: Self-pay | Admitting: Family Medicine

## 2019-04-15 ENCOUNTER — Other Ambulatory Visit: Payer: Self-pay

## 2019-04-15 ENCOUNTER — Ambulatory Visit: Payer: BC Managed Care – PPO | Admitting: Family Medicine

## 2019-04-15 VITALS — BP 118/67 | HR 63 | Temp 99.0°F | Resp 20 | Ht 71.0 in | Wt 235.0 lb

## 2019-04-15 DIAGNOSIS — G8929 Other chronic pain: Secondary | ICD-10-CM

## 2019-04-15 DIAGNOSIS — I10 Essential (primary) hypertension: Secondary | ICD-10-CM

## 2019-04-15 DIAGNOSIS — F3170 Bipolar disorder, currently in remission, most recent episode unspecified: Secondary | ICD-10-CM | POA: Diagnosis not present

## 2019-04-15 DIAGNOSIS — E782 Mixed hyperlipidemia: Secondary | ICD-10-CM

## 2019-04-15 DIAGNOSIS — E559 Vitamin D deficiency, unspecified: Secondary | ICD-10-CM | POA: Diagnosis not present

## 2019-04-15 DIAGNOSIS — Z6832 Body mass index (BMI) 32.0-32.9, adult: Secondary | ICD-10-CM

## 2019-04-15 DIAGNOSIS — F339 Major depressive disorder, recurrent, unspecified: Secondary | ICD-10-CM | POA: Diagnosis not present

## 2019-04-15 DIAGNOSIS — Z125 Encounter for screening for malignant neoplasm of prostate: Secondary | ICD-10-CM

## 2019-04-15 DIAGNOSIS — M545 Low back pain: Secondary | ICD-10-CM

## 2019-04-15 DIAGNOSIS — K219 Gastro-esophageal reflux disease without esophagitis: Secondary | ICD-10-CM | POA: Diagnosis not present

## 2019-04-15 MED ORDER — METHOCARBAMOL 500 MG PO TABS: ORAL_TABLET | ORAL | 3 refills | Status: DC

## 2019-04-15 MED ORDER — LISINOPRIL 10 MG PO TABS: 10.0000 mg | ORAL_TABLET | Freq: Every day | ORAL | 0 refills | Status: DC

## 2019-04-15 NOTE — Progress Notes (Signed)
Subjective:  Patient ID: Melvin Roberts, male    DOB: 05/21/1960, 58 y.o.   MRN: [id number]  Patient Care Team: Baruch Gouty, FNP as PCP - General (Family Medicine)   Chief Complaint:  Medical Management of Chronic Issues (4 mo ), Hyperlipidemia, and Hypertension   HPI: Melvin Roberts is a 58 y.o. male presenting on 04/15/2019 for Medical Management of Chronic Issues (4 mo ), Hyperlipidemia, and Hypertension   1. Essential hypertension Well controlled on lisinopril. No associated side effects. No chest pain, shortness of breath, leg swelling, headaches, weakness, confusion, or dizziness. Not checking blood pressure at home but does watch salt intake.   2. Mixed hyperlipidemia Taking omega 3 fatty acids daily. No longer taking Tricor or Vascepa. Is trying to watch diet. Active daily but no regular exercise.   3. Gastroesophageal reflux disease without esophagitis Compliant with medications - Yes Current medications - Protonix Adverse side effects - No Cough - No Sore throat - No Voice change - No Hemoptysis - No Dysphagia or dyspepsia - No Water brash - No Red Flags (weight loss, hematochezia, melena, weight loss, early satiety, fevers, odynophagia, or persistent vomiting) - No   4. Vitamin D deficiency Pt is taking oral repletion therapy. Denies bone pain and tenderness, muscle weakness, fracture, and difficulty walking. Lab Results  Component Value Date   VD25OH 26.0 (L) 03/05/2018   VD25OH 40.4 08/21/2017   VD25OH 61.2 02/20/2017   Lab Results  Component Value Date   CALCIUM 9.7 03/05/2018      5. Chronic left-sided low back pain without sciatica Ongoing lower back pain with muscle spasm. States worse after working long hours and having to work with arms raised over his head. No paresthesias, saddle anesthesia, bowel or bladder incontinence, or abdominal pain. No weight changes.   6. Depression, recurrent ([place]) 7. Bipolar affective disorder in  remission [facility]) States his symptoms have been very well controlled with lithium. No associated side effects. Will check levels today.      Relevant past medical, surgical, family, and social history reviewed and updated as indicated.  Allergies and medications reviewed and updated. Date reviewed: Chart in Epic.   Past Medical History:  Diagnosis Date  . Bipolar 1 disorder ([place])   . Hyperlipidemia   . Hypertension     Past Surgical History:  Procedure Laterality Date  . BACK SURGERY  11/17/95   Dr. Tonita Cong  . EYE SURGERY    . HERNIA REPAIR  [id number]   umbilical  . UPPER GASTROINTESTINAL ENDOSCOPY  x2   w/ esophageal dilation    Social History   Socioeconomic History  . Marital status: Married    Spouse name: Not on file  . Number of children: Not on file  . Years of education: Not on file  . Highest education level: Not on file  Occupational History  . Not on file  Tobacco Use  . Smoking status: Never Smoker  . Smokeless tobacco: Never Used  Substance and Sexual Activity  . Alcohol use: No  . Drug use: No  . Sexual activity: Not on file  Other Topics Concern  . Not on file  Social History Narrative  . Not on file   Social Determinants of Health   Financial Resource Strain:   . Difficulty of Paying Living Expenses: Not on file  Food Insecurity:   . Worried About Charity fundraiser in the Last Year: Not on file  . Ran Out of  Food in the Last Year: Not on file  Transportation Needs:   . Lack of Transportation (Medical): Not on file  . Lack of Transportation (Non-Medical): Not on file  Physical Activity:   . Days of Exercise per Week: Not on file  . Minutes of Exercise per Session: Not on file  Stress:   . Feeling of Stress : Not on file  Social Connections:   . Frequency of Communication with Friends and Family: Not on file  . Frequency of Social Gatherings with Friends and Family: Not on file  . Attends Religious Services: Not on file  . Active Member of  Clubs or Organizations: Not on file  . Attends Archivist Meetings: Not on file  . Marital Status: Not on file  Intimate Partner Violence:   . Fear of Current or Ex-Partner: Not on file  . Emotionally Abused: Not on file  . Physically Abused: Not on file  . Sexually Abused: Not on file    Outpatient Encounter Medications as of 04/15/2019  Medication Sig  . cholecalciferol (VITAMIN D) 1000 UNITS tablet Take 1,000 Units by mouth daily.  Marland Kitchen lisinopril (ZESTRIL) 10 MG tablet Take 1 tablet (10 mg total) by mouth daily. (Need to seen new provider)  . lithium 300 MG tablet TAKE 1 TABLET (300 MG TOTAL) BY MOUTH 3 (THREE) TIMES DAILY.  . methocarbamol (ROBAXIN) 500 MG tablet TAKE 1 TABLET BY MOUTH TWICE A DAY AS NEEDED FOR SPASMS (DAYTIME)  . pantoprazole (PROTONIX) 40 MG tablet Take 40 mg by mouth daily.  . [DISCONTINUED] lisinopril (ZESTRIL) 10 MG tablet Take 1 tablet (10 mg total) by mouth daily. (Need to seen new provider)  . [DISCONTINUED] methocarbamol (ROBAXIN) 500 MG tablet TAKE 1 TABLET BY MOUTH TWICE A DAY AS NEEDED FOR SPASMS (DAYTIME)  . [DISCONTINUED] fenofibrate (TRICOR) 145 MG tablet Take 1 tablet (145 mg total) by mouth daily.  . [DISCONTINUED] metaxalone (SKELAXIN) 800 MG tablet TAKE 1 TABLET BY MOUTH THREE TIMES A DAY  . [DISCONTINUED] Omega-3 Fatty Acids (FISH OIL PO) Take by mouth.  . [DISCONTINUED] omeprazole (PRILOSEC) 20 MG capsule Take 20 mg by mouth daily.  . [DISCONTINUED] VASCEPA 1 g CAPS TAKE 2 CAPSULES (2 G TOTAL) BY MOUTH 2 (TWO) TIMES DAILY.   No facility-administered encounter medications on file as of 04/15/2019.    Allergies  Allergen Reactions  . Lipitor [Atorvastatin]   . Niaspan [Niacin Er]     Review of Systems  Constitutional: Negative for activity change, appetite change, chills, diaphoresis and fatigue.  HENT: Negative.   Eyes: Negative.  Negative for photophobia and visual disturbance.  Respiratory: Negative for cough, chest tightness  and shortness of breath.   Cardiovascular: Negative for chest pain, palpitations and leg swelling.  Gastrointestinal: Negative for abdominal distention, abdominal pain, anal bleeding, blood in stool, constipation, diarrhea, nausea, rectal pain and vomiting.  Endocrine: Negative.  Negative for polydipsia, polyphagia and polyuria.  Genitourinary: Negative for decreased urine volume, difficulty urinating, dysuria, flank pain, frequency, hematuria, scrotal swelling, testicular pain and urgency.  Musculoskeletal: Positive for arthralgias and back pain. Negative for myalgias.  Skin: Negative.   Allergic/Immunologic: Negative.   Neurological: Negative for dizziness, tremors, seizures, syncope, facial asymmetry, speech difficulty, weakness, light-headedness, numbness and headaches.  Hematological: Negative.   Psychiatric/Behavioral: Negative for agitation, behavioral problems, confusion, decreased concentration, dysphoric mood, hallucinations, self-injury, sleep disturbance and suicidal ideas. The patient is not nervous/anxious and is not hyperactive.   All other systems reviewed and are negative.  Objective:  BP 118/67   Pulse 63   Temp 99 F (37.2 C)   Resp 20   Ht '5\' 11"'  (1.803 m)   Wt 235 lb (106.6 kg)   SpO2 97%   BMI 32.78 kg/m    Wt Readings from Last 3 Encounters:  04/15/19 235 lb (106.6 kg)  04/27/18 229 lb (103.9 kg)  03/05/18 234 lb (106.1 kg)    Physical Exam Vitals and nursing note reviewed.  Constitutional:      General: He is not in acute distress.    Appearance: Normal appearance. He is well-developed and well-groomed. He is obese. He is not ill-appearing, toxic-appearing or diaphoretic.  HENT:     Head: Normocephalic and atraumatic.     Jaw: There is normal jaw occlusion.     Right Ear: Hearing normal.     Left Ear: Hearing normal.     Nose: Nose normal.     Mouth/Throat:     Lips: Pink.     Mouth: Mucous membranes are moist.     Pharynx: Oropharynx is  clear. Uvula midline.  Eyes:     General: Lids are normal.     Extraocular Movements: Extraocular movements intact.     Conjunctiva/sclera: Conjunctivae normal.     Pupils: Pupils are equal, round, and reactive to light.  Neck:     Thyroid: No thyroid mass, thyromegaly or thyroid tenderness.     Vascular: No carotid bruit or JVD.     Trachea: Trachea and phonation normal.  Cardiovascular:     Rate and Rhythm: Normal rate and regular rhythm.     Chest Wall: PMI is not displaced.     Pulses: Normal pulses.     Heart sounds: Normal heart sounds. No murmur. No friction rub. No gallop.   Pulmonary:     Effort: Pulmonary effort is normal. No respiratory distress.     Breath sounds: Normal breath sounds. No wheezing.  Abdominal:     General: Bowel sounds are normal. There is no distension or abdominal bruit.     Palpations: Abdomen is soft. There is no hepatomegaly or splenomegaly.     Tenderness: There is no abdominal tenderness. There is no right CVA tenderness or left CVA tenderness.     Hernia: No hernia is present.  Musculoskeletal:        General: Normal range of motion.     Cervical back: Normal range of motion and neck supple.     Lumbar back: Spasms present.     Right lower leg: No edema.     Left lower leg: No edema.  Lymphadenopathy:     Cervical: No cervical adenopathy.  Skin:    General: Skin is warm and dry.     Capillary Refill: Capillary refill takes less than 2 seconds.     Coloration: Skin is not cyanotic, jaundiced or pale.     Findings: No rash.  Neurological:     General: No focal deficit present.     Mental Status: He is alert and oriented to person, place, and time.     Cranial Nerves: Cranial nerves are intact. No cranial nerve deficit.     Sensory: Sensation is intact. No sensory deficit.     Motor: Motor function is intact. No weakness.     Coordination: Coordination is intact. Coordination normal.     Gait: Gait is intact. Gait normal.     Deep Tendon  Reflexes: Reflexes are normal and symmetric. Reflexes normal.  Psychiatric:  Attention and Perception: Attention and perception normal.        Mood and Affect: Mood and affect normal.        Speech: Speech normal.        Behavior: Behavior normal. Behavior is cooperative.        Thought Content: Thought content normal.        Cognition and Memory: Cognition and memory normal.        Judgment: Judgment normal.     Results for orders placed or performed in visit on 03/05/18  CBC with Differential/Platelet  Result Value Ref Range   WBC 10.8 3.4 - 10.8 x10E3/uL   RBC 4.14 4.14 - 5.80 x10E6/uL   Hemoglobin 12.5 (L) 13.0 - 17.7 g/dL   Hematocrit 36.6 (L) 37.5 - 51.0 %   MCV 88 79 - 97 fL   MCH 30.2 26.6 - 33.0 pg   MCHC 34.2 31.5 - 35.7 g/dL   RDW 13.3 12.3 - 15.4 %   Platelets 295 150 - 450 x10E3/uL   Neutrophils 68 Not Estab. %   Lymphs 24 Not Estab. %   Monocytes 6 Not Estab. %   Eos 1 Not Estab. %   Basos 1 Not Estab. %   Neutrophils Absolute 7.3 (H) 1.4 - 7.0 x10E3/uL   Lymphocytes Absolute 2.6 0.7 - 3.1 x10E3/uL   Monocytes Absolute 0.6 0.1 - 0.9 x10E3/uL   EOS (ABSOLUTE) 0.1 0.0 - 0.4 x10E3/uL   Basophils Absolute 0.1 0.0 - 0.2 x10E3/uL   Immature Granulocytes 0 Not Estab. %   Immature Grans (Abs) 0.0 0.0 - 0.1 x10E3/uL  BMP8+EGFR  Result Value Ref Range   Glucose 80 65 - 99 mg/dL   BUN 14 6 - 24 mg/dL   Creatinine, Ser 1.09 0.76 - 1.27 mg/dL   GFR calc non Af Amer 75 >59 mL/min/1.73   GFR calc Af Amer 87 >59 mL/min/1.73   BUN/Creatinine Ratio 13 9 - 20   Sodium 136 134 - 144 mmol/L   Potassium 4.4 3.5 - 5.2 mmol/L   Chloride 101 96 - 106 mmol/L   CO2 21 20 - 29 mmol/L   Calcium 9.7 8.7 - 10.2 mg/dL  Lipid panel  Result Value Ref Range   Cholesterol, Total 203 (H) 100 - 199 mg/dL   Triglycerides 551 (HH) 0 - 149 mg/dL   HDL 32 (L) >39 mg/dL   VLDL Cholesterol Cal Comment 5 - 40 mg/dL   LDL Calculated Comment 0 - 99 mg/dL   Chol/HDL Ratio 6.3 (H) 0.0 -  5.0 ratio  VITAMIN D 25 Hydroxy (Vit-D Deficiency, Fractures)  Result Value Ref Range   Vit D, 25-Hydroxy 26.0 (L) 30.0 - 100.0 ng/mL  Hepatic function panel  Result Value Ref Range   Total Protein 7.2 6.0 - 8.5 g/dL   Albumin 4.4 3.5 - 5.5 g/dL   Bilirubin Total 0.2 0.0 - 1.2 mg/dL   Bilirubin, Direct 0.10 0.00 - 0.40 mg/dL   Alkaline Phosphatase 69 39 - 117 IU/L   AST 27 0 - 40 IU/L   ALT 29 0 - 44 IU/L  Lithium level  Result Value Ref Range   Lithium Lvl 0.8 0.6 - 1.2 mmol/L       Pertinent labs & imaging results that were available during my care of the patient were reviewed by me and considered in my medical decision making.  Assessment & Plan:  Cecilia was seen today for medical management of chronic issues, hyperlipidemia and hypertension.  Diagnoses  and all orders for this visit:  Essential hypertension BP well controlled. Changes were not made in regimen today. Goal BP is 130/80. Pt aware to report any persistent high or low readings. DASH diet and exercise encouraged. Exercise at least 150 minutes per week and increase as tolerated. Goal BMI > 25. Stress management encouraged. Avoid nicotine and tobacco product use. Avoid excessive alcohol and NSAID's. Avoid more than 2000 mg of sodium daily. Medications as prescribed. Follow up as scheduled.  -     lisinopril (ZESTRIL) 10 MG tablet; Take 1 tablet (10 mg total) by mouth daily. (Need to seen new provider) -     CMP14+EGFR -     CBC with Differential/Platelet -     Thyroid Panel With TSH  Mixed hyperlipidemia Diet encouraged - increase intake of fresh fruits and vegetables, increase intake of lean proteins. Bake, broil, or grill foods. Avoid fried, greasy, and fatty foods. Avoid fast foods. Increase intake of fiber-rich whole grains. Exercise encouraged - at least 150 minutes per week and advance as tolerated.  Goal BMI < 25. Continue medications as prescribed. Follow up in 3-6 months as discussed.  -     Lipid  panel  Gastroesophageal reflux disease without esophagitis No red flags present. Diet discussed. Avoid fried, spicy, fatty, greasy, and acidic foods. Avoid caffeine, nicotine, and alcohol. Do not eat 2-3 hours before bedtime and stay upright for at least 1-2 hours after eating. Eat small frequent meals. Avoid NSAID's like motrin and aleve. Medications as prescribed. Report any new or worsening symptoms. Follow up as discussed or sooner if needed.   -     CBC with Differential/Platelet  Vitamin D deficiency Labs pending. Continue repletion therapy. If indicated, will change repletion dosage. Eat foods rich in Vit D including milk, orange juice, yogurt with vitamin D added, salmon or mackerel, canned tuna fish, cereals with vitamin D added, and cod liver oil. Get out in the sun but make sure to wear at least SPF 30 sunscreen.  -     Vitamin D 25 hydroxy  Chronic left-sided low back pain without sciatica -     methocarbamol (ROBAXIN) 500 MG tablet; TAKE 1 TABLET BY MOUTH TWICE A DAY AS NEEDED FOR SPASMS (DAYTIME)  Depression, recurrent ([facility]) Bipolar affective disorder in remission ([place]) Well controlled with Lithium. No SI, HI, mania, or depression. Will continue Lithium as prescribed. Pt aware to report any new or worsening symptoms.  -     Thyroid Panel With TSH   Total time spent with patient 40 minutes.  Greater than 50% of encounter spent in coordination of care/counseling.   Continue all other maintenance medications.  Follow up plan: Return in about 6 months (around 10/14/2019), or if symptoms worsen or fail to improve.  Continue healthy lifestyle choices, including diet (rich in fruits, vegetables, and lean proteins, and low in salt and simple carbohydrates) and exercise (at least 30 minutes of moderate physical activity daily).  Educational handout given for DASH  The above assessment and management plan was discussed with the patient. The patient verbalized understanding of and has  agreed to the management plan. Patient is aware to call the clinic if they develop any new symptoms or if symptoms persist or worsen. Patient is aware when to return to the clinic for a follow-up visit. Patient educated on when it is appropriate to go to the emergency department.   Monia Pouch, FNP-C [facility] [phone]

## 2019-04-15 NOTE — Patient Instructions (Signed)
DASH Eating Plan DASH stands for "Dietary Approaches to Stop Hypertension." The DASH eating plan is a healthy eating plan that has been shown to reduce high blood pressure (hypertension). Additional health benefits may include reducing the risk of type 2 diabetes mellitus, heart disease, and stroke. The DASH eating plan may also help with weight loss.  WHAT DO I NEED TO KNOW ABOUT THE DASH EATING PLAN? For the DASH eating plan, you will follow these general guidelines:  Choose foods with a percent daily value for sodium of less than 5% (as listed on the food label).  Use salt-free seasonings or herbs instead of table salt or sea salt.  Check with your health care provider or pharmacist before using salt substitutes.  Eat lower-sodium products, often labeled as "lower sodium" or "no salt added."  Eat fresh foods.  Eat more vegetables, fruits, and low-fat dairy products.  Choose whole grains. Look for the word "whole" as the first word in the ingredient list.  Choose fish and skinless chicken or turkey more often than red meat. Limit fish, poultry, and meat to 6 oz (170 g) each day.  Limit sweets, desserts, sugars, and sugary drinks.  Choose heart-healthy fats.  Limit cheese to 1 oz (28 g) per day.  Eat more home-cooked food and less restaurant, buffet, and fast food.  Limit fried foods.  Cook foods using methods other than frying.  Limit canned vegetables. If you do use them, rinse them well to decrease the sodium.  When eating at a restaurant, ask that your food be prepared with less salt, or no salt if possible.  WHAT FOODS CAN I EAT? Seek help from a dietitian for individual calorie needs.  Grains Whole grain or whole wheat bread. Brown rice. Whole grain or whole wheat pasta. Quinoa, bulgur, and whole grain cereals. Low-sodium cereals. Corn or whole wheat flour tortillas. Whole grain cornbread. Whole grain crackers. Low-sodium crackers.  Vegetables Fresh or frozen  vegetables (raw, steamed, roasted, or grilled). Low-sodium or reduced-sodium tomato and vegetable juices. Low-sodium or reduced-sodium tomato sauce and paste. Low-sodium or reduced-sodium canned vegetables.   Fruits All fresh, canned (in natural juice), or frozen fruits.  Meat and Other Protein Products Ground beef (85% or leaner), grass-fed beef, or beef trimmed of fat. Skinless chicken or turkey. Ground chicken or turkey. Pork trimmed of fat. All fish and seafood. Eggs. Dried beans, peas, or lentils. Unsalted nuts and seeds. Unsalted canned beans.  Dairy Low-fat dairy products, such as skim or 1% milk, 2% or reduced-fat cheeses, low-fat ricotta or cottage cheese, or plain low-fat yogurt. Low-sodium or reduced-sodium cheeses.  Fats and Oils Tub margarines without trans fats. Light or reduced-fat mayonnaise and salad dressings (reduced sodium). Avocado. Safflower, olive, or canola oils. Natural peanut or almond butter.  Other Unsalted popcorn and pretzels. The items listed above may not be a complete list of recommended foods or beverages. Contact your dietitian for more options.  WHAT FOODS ARE NOT RECOMMENDED?  Grains White bread. White pasta. White rice. Refined cornbread. Bagels and croissants. Crackers that contain trans fat.  Vegetables Creamed or fried vegetables. Vegetables in a cheese sauce. Regular canned vegetables. Regular canned tomato sauce and paste. Regular tomato and vegetable juices.  Fruits Dried fruits. Canned fruit in light or heavy syrup. Fruit juice.  Meat and Other Protein Products Fatty cuts of meat. Ribs, chicken wings, bacon, sausage, bologna, salami, chitterlings, fatback, hot dogs, bratwurst, and packaged luncheon meats. Salted nuts and seeds. Canned beans with salt.    Dairy Whole or 2% milk, cream, half-and-half, and cream cheese. Whole-fat or sweetened yogurt. Full-fat cheeses or blue cheese. Nondairy creamers and whipped toppings. Processed cheese,  cheese spreads, or cheese curds.  Condiments Onion and garlic salt, seasoned salt, table salt, and sea salt. Canned and packaged gravies. Worcestershire sauce. Tartar sauce. Barbecue sauce. Teriyaki sauce. Soy sauce, including reduced sodium. Steak sauce. Fish sauce. Oyster sauce. Cocktail sauce. Horseradish. Ketchup and mustard. Meat flavorings and tenderizers. Bouillon cubes. Hot sauce. Tabasco sauce. Marinades. Taco seasonings. Relishes.  Fats and Oils Butter, stick margarine, lard, shortening, ghee, and bacon fat. Coconut, palm kernel, or palm oils. Regular salad dressings.  Other Pickles and olives. Salted popcorn and pretzels.  The items listed above may not be a complete list of foods and beverages to avoid. Contact your dietitian for more information.  WHERE CAN I FIND MORE INFORMATION? National Heart, Lung, and Blood Institute: www.nhlbi.nih.gov/health/health-topics/topics/dash/ Document Released: 03/24/2011 Document Revised: 08/19/2013 Document Reviewed: 02/06/2013 ExitCare Patient Information 2015 [employer]. This information is not intended to replace advice given to you by your health care provider. Make sure you discuss any questions you have with your health care provider.   I think that you would greatly benefit from seeing a nutritionist.  If you are interested, please call Dr Sykes at [phone] to schedule an appointment.   

## 2019-04-16 LAB — CBC WITH DIFFERENTIAL/PLATELET
Basophils Absolute: 0 10*3/uL (ref 0.0–0.2)
Basos: 0 %
EOS (ABSOLUTE): 0.3 10*3/uL (ref 0.0–0.4)
Eos: 3 %
Hematocrit: 39.5 % (ref 37.5–51.0)
Hemoglobin: 13.3 g/dL (ref 13.0–17.7)
Immature Grans (Abs): 0 10*3/uL (ref 0.0–0.1)
Immature Granulocytes: 1 %
Lymphocytes Absolute: 1.9 10*3/uL (ref 0.7–3.1)
Lymphs: 22 %
MCH: 29.8 pg (ref 26.6–33.0)
MCHC: 33.7 g/dL (ref 31.5–35.7)
MCV: 89 fL (ref 79–97)
Monocytes Absolute: 0.5 10*3/uL (ref 0.1–0.9)
Monocytes: 6 %
Neutrophils Absolute: 6 10*3/uL (ref 1.4–7.0)
Neutrophils: 68 %
Platelets: 268 10*3/uL (ref 150–450)
RBC: 4.46 x10E6/uL (ref 4.14–5.80)
RDW: 13.3 % (ref 11.6–15.4)
WBC: 8.7 10*3/uL (ref 3.4–10.8)

## 2019-04-16 LAB — CMP14+EGFR
ALT: 38 IU/L (ref 0–44)
AST: 36 IU/L (ref 0–40)
Albumin/Globulin Ratio: 2 (ref 1.2–2.2)
Albumin: 4.5 g/dL (ref 3.8–4.9)
Alkaline Phosphatase: 79 IU/L (ref 39–117)
BUN/Creatinine Ratio: 9 (ref 9–20)
BUN: 12 mg/dL (ref 6–24)
Bilirubin Total: 0.4 mg/dL (ref 0.0–1.2)
CO2: 22 mmol/L (ref 20–29)
Calcium: 10.1 mg/dL (ref 8.7–10.2)
Chloride: 103 mmol/L (ref 96–106)
Creatinine, Ser: 1.28 mg/dL — ABNORMAL HIGH (ref 0.76–1.27)
GFR calc Af Amer: 71 mL/min/{1.73_m2} (ref >59)
GFR calc non Af Amer: 61 mL/min/{1.73_m2} (ref >59)
Globulin, Total: 2.3 g/dL (ref 1.5–4.5)
Glucose: 88 mg/dL (ref 65–99)
Potassium: 5.3 mmol/L — ABNORMAL HIGH (ref 3.5–5.2)
Sodium: 139 mmol/L (ref 134–144)
Total Protein: 6.8 g/dL (ref 6.0–8.5)

## 2019-04-16 LAB — LIPID PANEL
Chol/HDL Ratio: 5.4 ratio — ABNORMAL HIGH (ref 0.0–5.0)
Cholesterol, Total: 194 mg/dL (ref 100–199)
HDL: 36 mg/dL — ABNORMAL LOW (ref >39)
LDL Chol Calc (NIH): 111 mg/dL — ABNORMAL HIGH (ref 0–99)
Triglycerides: 274 mg/dL — ABNORMAL HIGH (ref 0–149)
VLDL Cholesterol Cal: 47 mg/dL — ABNORMAL HIGH (ref 5–40)

## 2019-04-16 LAB — LITHIUM LEVEL: Lithium Lvl: 1.1 mmol/L (ref 0.6–1.2)

## 2019-04-16 LAB — THYROID PANEL WITH TSH
Free Thyroxine Index: 1.3 (ref 1.2–4.9)
T3 Uptake Ratio: 22 % — ABNORMAL LOW (ref 24–39)
T4, Total: 6 ug/dL (ref 4.5–12.0)
TSH: 1.65 u[IU]/mL (ref 0.450–4.500)

## 2019-04-16 LAB — PSA, TOTAL AND FREE
PSA, Free Pct: 26.9 %
PSA, Free: 0.43 ng/mL
Prostate Specific Ag, Serum: 1.6 ng/mL (ref 0.0–4.0)

## 2019-04-16 LAB — VITAMIN D 25 HYDROXY (VIT D DEFICIENCY, FRACTURES): Vit D, 25-Hydroxy: 38.3 ng/mL (ref 30.0–100.0)

## 2019-04-29 ENCOUNTER — Encounter: Payer: Self-pay | Admitting: Family Medicine

## 2019-05-06 ENCOUNTER — Other Ambulatory Visit: Payer: Self-pay | Admitting: Family Medicine

## 2019-05-06 ENCOUNTER — Other Ambulatory Visit: Payer: Self-pay

## 2019-05-06 ENCOUNTER — Other Ambulatory Visit: Payer: BC Managed Care – PPO

## 2019-05-06 DIAGNOSIS — R7989 Other specified abnormal findings of blood chemistry: Secondary | ICD-10-CM

## 2019-05-07 ENCOUNTER — Other Ambulatory Visit: Payer: Self-pay | Admitting: Family Medicine

## 2019-05-07 DIAGNOSIS — I10 Essential (primary) hypertension: Secondary | ICD-10-CM

## 2019-05-07 DIAGNOSIS — M542 Cervicalgia: Secondary | ICD-10-CM | POA: Diagnosis not present

## 2019-05-07 LAB — BMP8+EGFR
BUN/Creatinine Ratio: 11 (ref 9–20)
BUN: 14 mg/dL (ref 6–24)
CO2: 22 mmol/L (ref 20–29)
Calcium: 10 mg/dL (ref 8.7–10.2)
Chloride: 105 mmol/L (ref 96–106)
Creatinine, Ser: 1.29 mg/dL — ABNORMAL HIGH (ref 0.76–1.27)
GFR calc Af Amer: 70 mL/min/{1.73_m2} (ref >59)
GFR calc non Af Amer: 61 mL/min/{1.73_m2} (ref >59)
Glucose: 97 mg/dL (ref 65–99)
Potassium: 4.9 mmol/L (ref 3.5–5.2)
Sodium: 138 mmol/L (ref 134–144)

## 2019-06-02 ENCOUNTER — Other Ambulatory Visit: Payer: Self-pay | Admitting: Physician Assistant

## 2019-06-03 NOTE — Telephone Encounter (Signed)
Last office visit and lithium level 04/15/2019

## 2019-06-23 ENCOUNTER — Ambulatory Visit: Payer: Self-pay | Attending: Internal Medicine

## 2019-06-23 DIAGNOSIS — Z23 Encounter for immunization: Secondary | ICD-10-CM | POA: Insufficient documentation

## 2019-06-23 NOTE — Progress Notes (Signed)
   Covid-19 Vaccination Clinic  Name:  Melvin Roberts    MRN: [id number] DOB: [birth year]  06/23/2019  [name] was observed post Covid-19 immunization for 15 minutes without incident. He was provided with Vaccine Information Sheet and instruction to access the V-Safe system.   [name] was instructed to call 911 with any severe reactions post vaccine: Marland Kitchen Difficulty breathing  . Swelling of face and throat  . A fast heartbeat  . A bad rash all over body  . Dizziness and weakness   Immunizations Administered    Name Date Dose VIS Date Route   Pfizer COVID-19 Vaccine 06/23/2019  2:56 PM 0.3 mL 03/29/2019 Intramuscular   Manufacturer: [employer], [employer]   Lot: [id number]   NDC: [address]-1

## 2019-06-24 DIAGNOSIS — M2241 Chondromalacia patellae, right knee: Secondary | ICD-10-CM | POA: Diagnosis not present

## 2019-07-09 DIAGNOSIS — M25571 Pain in right ankle and joints of right foot: Secondary | ICD-10-CM | POA: Diagnosis not present

## 2019-07-14 ENCOUNTER — Ambulatory Visit: Payer: Self-pay | Attending: Internal Medicine

## 2019-07-14 ENCOUNTER — Other Ambulatory Visit: Payer: Self-pay

## 2019-07-14 DIAGNOSIS — Z23 Encounter for immunization: Secondary | ICD-10-CM

## 2019-07-14 NOTE — Progress Notes (Signed)
   Covid-19 Vaccination Clinic  Name:  Melvin Roberts    MRN: [id number] DOB: 1960/04/28  07/14/2019  [name] was observed post Covid-19 immunization for 15 minutes without incident. He was provided with Vaccine Information Sheet and instruction to access the V-Safe system.   [name] was instructed to call 911 with any severe reactions post vaccine: Marland Kitchen Difficulty breathing  . Swelling of face and throat  . A fast heartbeat  . A bad rash all over body  . Dizziness and weakness   Immunizations Administered    Name Date Dose VIS Date Route   Pfizer COVID-19 Vaccine 07/14/2019 12:58 PM 0.3 mL 03/29/2019 Intramuscular   Manufacturer: [employer], [employer]   Lot: [id number]   NDC: [address]-0

## 2019-07-16 DIAGNOSIS — M79671 Pain in right foot: Secondary | ICD-10-CM | POA: Diagnosis not present

## 2019-07-22 DIAGNOSIS — M79671 Pain in right foot: Secondary | ICD-10-CM | POA: Diagnosis not present

## 2019-07-26 ENCOUNTER — Encounter: Payer: Self-pay | Admitting: *Deleted

## 2019-07-31 DIAGNOSIS — M79671 Pain in right foot: Secondary | ICD-10-CM | POA: Diagnosis not present

## 2019-08-27 ENCOUNTER — Other Ambulatory Visit: Payer: Self-pay

## 2019-08-27 ENCOUNTER — Telehealth (INDEPENDENT_AMBULATORY_CARE_PROVIDER_SITE_OTHER): Payer: BC Managed Care – PPO | Admitting: Family

## 2019-08-27 ENCOUNTER — Encounter: Payer: Self-pay | Admitting: Family

## 2019-08-27 DIAGNOSIS — J019 Acute sinusitis, unspecified: Secondary | ICD-10-CM | POA: Diagnosis not present

## 2019-08-27 MED ORDER — AMOXICILLIN-POT CLAVULANATE 875-125 MG PO TABS: 1.0000 | ORAL_TABLET | Freq: Two times a day (BID) | ORAL | 0 refills | Status: DC

## 2019-08-27 NOTE — Progress Notes (Signed)
   Virtual Visit via telephone Note Due to COVID-19 pandemic this visit was conducted virtually. This visit type was conducted due to national recommendations for restrictions regarding the COVID-19 Pandemic (e.g. social distancing, sheltering in place) in an effort to limit this patient's exposure and mitigate transmission in our community. All issues noted in this document were discussed and addressed.  A physical exam was not performed with this format.  I connected with Melvin Roberts on 08/27/19 at 11:49 AM by telephone  And video and verified that I am speaking with the correct person using two identifiers. Melvin Roberts is currently located at home and no one  is currently with him  during visit. The provider, Jannifer Rodney, FNP is located in their office at time of visit.  I discussed the limitations, risks, security and privacy concerns of performing an evaluation and management service by telephone and the availability of in person appointments. I also discussed with the patient that there may be a patient responsible charge related to this service. The patient expressed understanding and agreed to proceed.   History and Present Illness:  Sinusitis This is a new problem. The current episode started 1 to 4 weeks ago. The problem has been gradually worsening since onset. There has been no fever. His pain is at a severity of 5/10. The pain is mild. Associated symptoms include congestion, coughing, ear pain, headaches, a hoarse voice, sinus pressure and a sore throat. Pertinent negatives include no sneezing. Past treatments include oral decongestants and acetaminophen. The treatment provided mild relief.      Review of Systems  HENT: Positive for congestion, ear pain, hoarse voice, sinus pressure and sore throat. Negative for sneezing.   Respiratory: Positive for cough.   Neurological: Positive for headaches.  All other systems reviewed and are  negative.    Observations/Objective: No SOB or distress noted, intermittent coarse cough  Assessment and Plan: 1. Acute sinusitis, recurrence not specified, unspecified location - Take meds as prescribed - Use a cool mist humidifier  -Use saline nose sprays frequently -Force fluids -For any cough or congestion  Use plain Mucinex- regular strength or max strength is fine -For fever or aces or pains- take tylenol or ibuprofen. -Throat lozenges if help -New toothbrush in 3 days -RTO if symptoms worsen or do not improve  - amoxicillin-clavulanate (AUGMENTIN) 875-125 MG tablet; Take 1 tablet by mouth 2 (two) times daily.  Dispense: 14 tablet; Refill: 0     I discussed the assessment and treatment plan with the patient. The patient was provided an opportunity to ask questions and all were answered. The patient agreed with the plan and demonstrated an understanding of the instructions.   The patient was advised to call back or seek an in-person evaluation if the symptoms worsen or if the condition fails to improve as anticipated.  The above assessment and management plan was discussed with the patient. The patient verbalized understanding of and has agreed to the management plan. Patient is aware to call the clinic if symptoms persist or worsen. Patient is aware when to return to the clinic for a follow-up visit. Patient educated on when it is appropriate to go to the emergency department.   Time call ended: 12:00 pm    I provided 11 minutes of non- and face-to-face time during this encounter.    Jannifer Rodney, FNP

## 2019-09-10 ENCOUNTER — Other Ambulatory Visit: Payer: Self-pay | Admitting: *Deleted

## 2019-09-10 MED ORDER — LITHIUM CARBONATE 300 MG PO TABS: 300.0000 mg | ORAL_TABLET | Freq: Three times a day (TID) | ORAL | 0 refills | Status: DC

## 2019-09-23 ENCOUNTER — Ambulatory Visit (INDEPENDENT_AMBULATORY_CARE_PROVIDER_SITE_OTHER): Payer: BC Managed Care – PPO | Admitting: Family Medicine

## 2019-09-23 ENCOUNTER — Encounter: Payer: Self-pay | Admitting: Family Medicine

## 2019-09-23 DIAGNOSIS — J019 Acute sinusitis, unspecified: Secondary | ICD-10-CM

## 2019-09-23 MED ORDER — AMOXICILLIN-POT CLAVULANATE 875-125 MG PO TABS: 1.0000 | ORAL_TABLET | Freq: Two times a day (BID) | ORAL | 0 refills | Status: DC

## 2019-09-23 NOTE — Progress Notes (Signed)
Virtual Visit via telephone Note  I connected with Melvin Roberts on 09/23/19 at 1531 by telephone and verified that I am speaking with the correct person using two identifiers. Melvin Roberts is currently located at home and no other people are currently with her during visit. The provider, Fransisca Kaufmann Tylar Merendino, MD is located in their office at time of visit.  Call ended at 1538  I discussed the limitations, risks, security and privacy concerns of performing an evaluation and management service by telephone and the availability of in person appointments. I also discussed with the patient that there may be a patient responsible charge related to this service. The patient expressed understanding and agreed to proceed.   History and Present Illness: Patient is calling in for upper teeth pain and took the augmentin and it almost cleared it but not completely and saw ENT in the past for this.  He denies any fevers or chills.  He also has a dental problem in his upper teeth. He denies fevers or chills. He hurts on both sides but one side is worse. The right side is worse.   No diagnosis found.  Outpatient Encounter Medications as of 09/23/2019  Medication Sig  . amoxicillin-clavulanate (AUGMENTIN) 875-125 MG tablet Take 1 tablet by mouth 2 (two) times daily.  . cholecalciferol (VITAMIN D) 1000 UNITS tablet Take 1,000 Units by mouth daily.  Marland Kitchen lisinopril (ZESTRIL) 10 MG tablet TAKE 1 TABLET (10 MG TOTAL) BY MOUTH DAILY. (NEED TO SEEN NEW PROVIDER)  . lithium 300 MG tablet Take 1 tablet (300 mg total) by mouth 3 (three) times daily.  . methocarbamol (ROBAXIN) 500 MG tablet TAKE 1 TABLET BY MOUTH TWICE A DAY AS NEEDED FOR SPASMS (DAYTIME)  . pantoprazole (PROTONIX) 40 MG tablet Take 40 mg by mouth daily.   No facility-administered encounter medications on file as of 09/23/2019.    Review of Systems  Constitutional: Negative for chills and fever.  HENT: Positive for congestion, postnasal  drip, rhinorrhea, sinus pressure and sneezing. Negative for ear discharge, ear pain, sore throat and voice change.   Eyes: Negative for pain, discharge, redness and visual disturbance.  Respiratory: Positive for cough. Negative for shortness of breath and wheezing.   Cardiovascular: Negative for chest pain and leg swelling.  Musculoskeletal: Negative for gait problem.  Skin: Negative for rash.  All other systems reviewed and are negative.   Observations/Objective: Patient sounds comfortable and in no acute distress  Assessment and Plan: Problem List Items Addressed This Visit    None    Visit Diagnoses    Acute sinusitis, recurrence not specified, unspecified location       Relevant Medications   amoxicillin-clavulanate (AUGMENTIN) 875-125 MG tablet      Recurrent sinus infections, has seen ENT infrequently needs to be treated a couple times a year for similar symptoms, he took an antibiotic last month and it almost cleared up but not all the way. Follow up plan: Return if symptoms worsen or fail to improve.     I discussed the assessment and treatment plan with the patient. The patient was provided an opportunity to ask questions and all were answered. The patient agreed with the plan and demonstrated an understanding of the instructions.   The patient was advised to call back or seek an in-person evaluation if the symptoms worsen or if the condition fails to improve as anticipated.  The above assessment and management plan was discussed with the patient. The patient verbalized understanding  of and has agreed to the management plan. Patient is aware to call the clinic if symptoms persist or worsen. Patient is aware when to return to the clinic for a follow-up visit. Patient educated on when it is appropriate to go to the emergency department.    I provided 7 minutes of non-face-to-face time during this encounter.    Nils Pyle, MD

## 2019-10-17 ENCOUNTER — Ambulatory Visit: Payer: BC Managed Care – PPO | Admitting: Family Medicine

## 2019-10-18 ENCOUNTER — Encounter: Payer: Self-pay | Admitting: Family Medicine

## 2019-10-28 ENCOUNTER — Telehealth: Payer: Self-pay | Admitting: Family Medicine

## 2019-10-28 NOTE — Telephone Encounter (Signed)
Patient is in need of a physical and needs late in the day. Appt made for next Monday at 4pm

## 2019-11-04 ENCOUNTER — Ambulatory Visit (INDEPENDENT_AMBULATORY_CARE_PROVIDER_SITE_OTHER): Payer: BC Managed Care – PPO | Admitting: Nurse Practitioner

## 2019-11-04 ENCOUNTER — Other Ambulatory Visit: Payer: Self-pay

## 2019-11-04 ENCOUNTER — Encounter: Payer: Self-pay | Admitting: Nurse Practitioner

## 2019-11-04 VITALS — BP 116/75 | HR 91 | Temp 98.1°F | Ht 71.0 in | Wt 235.0 lb

## 2019-11-04 DIAGNOSIS — Z0000 Encounter for general adult medical examination without abnormal findings: Secondary | ICD-10-CM | POA: Diagnosis not present

## 2019-11-04 DIAGNOSIS — E785 Hyperlipidemia, unspecified: Secondary | ICD-10-CM | POA: Diagnosis not present

## 2019-11-04 DIAGNOSIS — R7309 Other abnormal glucose: Secondary | ICD-10-CM | POA: Diagnosis not present

## 2019-11-04 NOTE — Assessment & Plan Note (Signed)
Patient is a 59 year old male who presents to clinic today for an annual wellness visit.  Physical exam completed.  Patient provided with education on health maintenance. Printed handouts given to patient. Labs ordered today CBC, CMP, TSH, and lipid panel.

## 2019-11-04 NOTE — Progress Notes (Signed)
Established Patient Office Visit  Subjective:  Patient ID: Melvin Roberts, male    DOB: 1960/12/12  Age: 59 y.o. MRN: [id number]  CC:  Chief Complaint  Patient presents with   Annual Exam    HPI MIRZA FESSEL presents for annual wellness visit Encounter for general adult medical examination without abnormal findings  Physical: Patient's last physical exam was 1 year ago .  Weight: not appropriate for height (BMI greater than 27%) ;  Blood Pressure: Normal (BP less than 120/80) ;  Medical History: Patient history reviewed ; Family history reviewed ;  Allergies Reviewed: No change in current allergies ;  Medications Reviewed: Medications reviewed - no changes ;  Lipids: Abnormal lipid levels ;  Smoking: Life-long non-smoker ;  Physical Activity:yes Exercises at least 3 times per week ; (walking) Alcohol/Drug Use: Is a non-drinker ; No illicit drug use ;  Patient is not afflicted from Stress Incontinence and Urge Incontinence  Safety: reviewed ; Patient wears a seat belt, has smoke detectors, has carbon monoxide detectors, practices appropriate gun safety, and wears sunscreen with extended sun exposure. Dental Care: biannual cleanings, brushes and flosses daily. Ophthalmology/Optometry: Annual visit.  Hearing loss: Some hearing loss present Vision impairments: none  Past Medical History:  Diagnosis Date   Bipolar 1 disorder ([place])    Hyperlipidemia    Hypertension     Past Surgical History:  Procedure Laterality Date   BACK SURGERY  11/17/95   Dr. Tonita Cong   EYE SURGERY     HERNIA REPAIR  6226   umbilical   UPPER GASTROINTESTINAL ENDOSCOPY  x2   w/ esophageal dilation    Family History  Problem Relation Age of Onset   Cancer Mother        breast   Cancer Father        lug   Cancer Maternal Grandfather    Cancer Paternal Grandfather    Colon cancer Neg Hx     Social History   Socioeconomic History   Marital status: Married    Spouse  name: Not on file   Number of children: Not on file   Years of education: Not on file   Highest education level: Not on file  Occupational History   Not on file  Tobacco Use   Smoking status: Never Smoker   Smokeless tobacco: Never Used  Vaping Use   Vaping Use: Never used  Substance and Sexual Activity   Alcohol use: No   Drug use: No   Sexual activity: Not on file  Other Topics Concern   Not on file  Social History Narrative   Not on file   Social Determinants of Health   Financial Resource Strain:    Difficulty of Paying Living Expenses:   Food Insecurity:    Worried About Charity fundraiser in the Last Year:    Arboriculturist in the Last Year:   Transportation Needs:    Film/video editor (Medical):    Lack of Transportation (Non-Medical):   Physical Activity:    Days of Exercise per Week:    Minutes of Exercise per Session:   Stress:    Feeling of Stress :   Social Connections:    Frequency of Communication with Friends and Family:    Frequency of Social Gatherings with Friends and Family:    Attends Religious Services:    Active Member of Clubs or Organizations:    Attends Archivist Meetings:  Marital Status:   Intimate Partner Violence:    Fear of Current or Ex-Partner:    Emotionally Abused:    Physically Abused:    Sexually Abused:       Allergies  Allergen Reactions   Lipitor [Atorvastatin]    Niaspan [Niacin Er]     ROS Review of Systems  Constitutional: Negative.   HENT: Negative.   Eyes: Negative.   Respiratory: Negative.   Cardiovascular: Negative.   Gastrointestinal: Negative.   Genitourinary: Negative.   Musculoskeletal: Negative.   Skin: Negative.   Neurological: Negative.   Psychiatric/Behavioral: Negative.       Objective:    Physical Exam Constitutional:      Appearance: Normal appearance. He is obese.  HENT:     Head: Normocephalic.     Right Ear: External ear  normal.     Left Ear: External ear normal.     Mouth/Throat:     Mouth: Mucous membranes are moist.     Pharynx: Oropharynx is clear.  Eyes:     Conjunctiva/sclera: Conjunctivae normal.     Pupils: Pupils are equal, round, and reactive to light.  Cardiovascular:     Rate and Rhythm: Normal rate and regular rhythm.     Pulses: Normal pulses.     Heart sounds: Normal heart sounds.  Pulmonary:     Effort: Pulmonary effort is normal. No respiratory distress.     Breath sounds: Normal breath sounds. No wheezing.  Abdominal:     General: Bowel sounds are normal. There is no distension.     Tenderness: There is no abdominal tenderness.  Musculoskeletal:        General: No swelling or tenderness. Normal range of motion.     Cervical back: Normal range of motion and neck supple. No rigidity or tenderness.  Skin:    General: Skin is warm.     Findings: No erythema or rash.  Neurological:     Mental Status: He is alert and oriented to person, place, and time.  Psychiatric:        Mood and Affect: Mood normal.        Behavior: Behavior normal.     BP 116/75    Pulse 91    Temp 98.1 F (36.7 C)    Ht '5\' 11"'  (1.803 m)    Wt 235 lb (106.6 kg)    SpO2 97%    BMI 32.78 kg/m  Wt Readings from Last 3 Encounters:  11/04/19 235 lb (106.6 kg)  04/15/19 235 lb (106.6 kg)  04/27/18 229 lb (103.9 kg)     There are no preventive care reminders to display for this patient.  There are no preventive care reminders to display for this patient.  Lab Results  Component Value Date   TSH 1.650 04/15/2019   Lab Results  Component Value Date   WBC 8.7 04/15/2019   HGB 13.3 04/15/2019   HCT 39.5 04/15/2019   MCV 89 04/15/2019   PLT 268 04/15/2019   Lab Results  Component Value Date   NA 138 05/06/2019   K 4.9 05/06/2019   CO2 22 05/06/2019   GLUCOSE 97 05/06/2019   BUN 14 05/06/2019   CREATININE 1.29 (H) 05/06/2019   BILITOT 0.4 04/15/2019   ALKPHOS 79 04/15/2019   AST 36 04/15/2019    ALT 38 04/15/2019   PROT 6.8 04/15/2019   ALBUMIN 4.5 04/15/2019   CALCIUM 10.0 05/06/2019   Lab Results  Component Value Date   CHOL  194 04/15/2019   Lab Results  Component Value Date   HDL 36 (L) 04/15/2019   Lab Results  Component Value Date   LDLCALC 111 (H) 04/15/2019   Lab Results  Component Value Date   TRIG 274 (H) 04/15/2019   Lab Results  Component Value Date   CHOLHDL 5.4 (H) 04/15/2019   No results found for: HGBA1C    Assessment & Plan:   Problem List Items Addressed This Visit      Other   Annual physical exam - Primary    Patient is a 59 year old male who presents to clinic today for an annual wellness visit.  Physical exam completed.  Patient provided with education on health maintenance. Printed handouts given to patient. Labs ordered today CBC, CMP, TSH, and lipid panel.      Relevant Orders   Lipid panel   CBC with Differential   CMP14+EGFR   TSH        Follow-up: Return in about 1 year (around 11/03/2020), or if symptoms worsen or fail to improve.    Ivy Lynn, NP

## 2019-11-04 NOTE — Patient Instructions (Signed)
Healthy Eating Following a healthy eating pattern may help you to achieve and maintain a healthy body weight, reduce the risk of chronic disease, and live a long and productive life. It is important to follow a healthy eating pattern at an appropriate calorie level for your body. Your nutritional needs should be met primarily through food by choosing a variety of nutrient-rich foods. What are tips for following this plan? Reading food labels  Read labels and choose the following: ? Reduced or low sodium. ? Juices with 100% fruit juice. ? Foods with low saturated fats and high polyunsaturated and monounsaturated fats. ? Foods with whole grains, such as whole wheat, cracked wheat, brown rice, and wild rice. ? Whole grains that are fortified with folic acid. This is recommended for women who are pregnant or who want to become pregnant.  Read labels and avoid the following: ? Foods with a lot of added sugars. These include foods that contain brown sugar, corn sweetener, corn syrup, dextrose, fructose, glucose, high-fructose corn syrup, honey, invert sugar, lactose, malt syrup, maltose, molasses, raw sugar, sucrose, trehalose, or turbinado sugar.  Do not eat more than the following amounts of added sugar per day:  6 teaspoons (25 g) for women.  9 teaspoons (38 g) for men. ? Foods that contain processed or refined starches and grains. ? Refined grain products, such as white flour, degermed cornmeal, white bread, and white rice. Shopping  Choose nutrient-rich snacks, such as vegetables, whole fruits, and nuts. Avoid high-calorie and high-sugar snacks, such as potato chips, fruit snacks, and candy.  Use oil-based dressings and spreads on foods instead of solid fats such as butter, stick margarine, or cream cheese.  Limit pre-made sauces, mixes, and "instant" products such as flavored rice, instant noodles, and ready-made pasta.  Try more plant-protein sources, such as tofu, tempeh, black beans,  edamame, lentils, nuts, and seeds.  Explore eating plans such as the Mediterranean diet or vegetarian diet. Cooking  Use oil to saut or stir-fry foods instead of solid fats such as butter, stick margarine, or lard.  Try baking, boiling, grilling, or broiling instead of frying.  Remove the fatty part of meats before cooking.  Steam vegetables in water or broth. Meal planning   At meals, imagine dividing your plate into fourths: ? One-half of your plate is fruits and vegetables. ? One-fourth of your plate is whole grains. ? One-fourth of your plate is protein, especially lean meats, poultry, eggs, tofu, beans, or nuts.  Include low-fat dairy as part of your daily diet. Lifestyle  Choose healthy options in all settings, including home, work, school, restaurants, or stores.  Prepare your food safely: ? Wash your hands after handling raw meats. ? Keep food preparation surfaces clean by regularly washing with hot, soapy water. ? Keep raw meats separate from ready-to-eat foods, such as fruits and vegetables. ? Cook seafood, meat, poultry, and eggs to the recommended internal temperature. ? Store foods at safe temperatures. In general:  Keep cold foods at 59F (4.4C) or below.  Keep hot foods at 159F (60C) or above.  Keep your freezer at [facility] (-17.8C) or below.  Foods are no longer safe to eat when they have been between the temperatures of 40-159F (4.4-60C) for more than 2 hours. What foods should I eat? Fruits Aim to eat 2 cup-equivalents of fresh, canned (in natural juice), or frozen fruits each day. Examples of 1 cup-equivalent of fruit include 1 small apple, 8 large strawberries, 1 cup canned fruit,  cup  dried fruit, or 1 cup 100% juice. Vegetables Aim to eat 2-3 cup-equivalents of fresh and frozen vegetables each day, including different varieties and colors. Examples of 1 cup-equivalent of vegetables include 2 medium carrots, 2 cups raw, leafy greens, 1 cup chopped  vegetable (raw or cooked), or 1 medium baked potato. Grains Aim to eat 6 ounce-equivalents of whole grains each day. Examples of 1 ounce-equivalent of grains include 1 slice of bread, 1 cup ready-to-eat cereal, 3 cups popcorn, or  cup cooked rice, pasta, or cereal. Meats and other proteins Aim to eat 5-6 ounce-equivalents of protein each day. Examples of 1 ounce-equivalent of protein include 1 egg, 1/2 cup nuts or seeds, or 1 tablespoon (16 g) peanut butter. A cut of meat or fish that is the size of a deck of cards is about 3-4 ounce-equivalents.  Of the protein you eat each week, try to have at least 8 ounces come from seafood. This includes salmon, trout, herring, and anchovies. Dairy Aim to eat 3 cup-equivalents of fat-free or low-fat dairy each day. Examples of 1 cup-equivalent of dairy include 1 cup (240 mL) milk, 8 ounces (250 g) yogurt, 1 ounces (44 g) natural cheese, or 1 cup (240 mL) fortified soy milk. Fats and oils  Aim for about 5 teaspoons (21 g) per day. Choose monounsaturated fats, such as canola and olive oils, avocados, peanut butter, and most nuts, or polyunsaturated fats, such as sunflower, corn, and soybean oils, walnuts, pine nuts, sesame seeds, sunflower seeds, and flaxseed. Beverages  Aim for six 8-oz glasses of water per day. Limit coffee to three to five 8-oz cups per day.  Limit caffeinated beverages that have added calories, such as soda and energy drinks.  Limit alcohol intake to no more than 1 drink a day for nonpregnant women and 2 drinks a day for men. One drink equals 12 oz of beer (355 mL), 5 oz of wine (148 mL), or 1 oz of hard liquor (44 mL). Seasoning and other foods  Avoid adding excess amounts of salt to your foods. Try flavoring foods with herbs and spices instead of salt.  Avoid adding sugar to foods.  Try using oil-based dressings, sauces, and spreads instead of solid fats. This information is based on general [place] nutrition guidelines. For more  information, visit [employer]. Exact amounts may vary based on your nutrition needs. Summary  A healthy eating plan may help you to maintain a healthy weight, reduce the risk of chronic diseases, and stay active throughout your life.  Plan your meals. Make sure you eat the right portions of a variety of nutrient-rich foods.  Try baking, boiling, grilling, or broiling instead of frying.  Choose healthy options in all settings, including home, work, school, restaurants, or stores. This information is not intended to replace advice given to you by your health care provider. Make sure you discuss any questions you have with your health care provider. Document Revised: 07/17/2017 Document Reviewed: 07/17/2017 Elsevier Patient Education  [place].

## 2019-11-05 ENCOUNTER — Other Ambulatory Visit: Payer: Self-pay | Admitting: Nurse Practitioner

## 2019-11-05 DIAGNOSIS — R7309 Other abnormal glucose: Secondary | ICD-10-CM

## 2019-11-05 LAB — HGB A1C W/O EAG: Hgb A1c MFr Bld: 5.2 % (ref 4.8–5.6)

## 2019-11-05 LAB — SPECIMEN STATUS REPORT

## 2019-11-05 MED ORDER — FENOFIBRATE 48 MG PO TABS: 48.0000 mg | ORAL_TABLET | Freq: Every day | ORAL | 2 refills | Status: DC

## 2019-11-06 ENCOUNTER — Telehealth: Payer: Self-pay | Admitting: Family Medicine

## 2019-11-06 LAB — CMP14+EGFR
ALT: 38 IU/L (ref 0–44)
AST: 71 IU/L — ABNORMAL HIGH (ref 0–40)
Albumin/Globulin Ratio: 1.7 (ref 1.2–2.2)
Albumin: 4.5 g/dL (ref 3.8–4.9)
Alkaline Phosphatase: 84 IU/L (ref 48–121)
BUN/Creatinine Ratio: 12 (ref 9–20)
BUN: 15 mg/dL (ref 6–24)
Bilirubin Total: <0.2 mg/dL (ref 0.0–1.2)
CO2: 18 mmol/L — ABNORMAL LOW (ref 20–29)
Calcium: 9.4 mg/dL (ref 8.7–10.2)
Chloride: 105 mmol/L (ref 96–106)
Creatinine, Ser: 1.29 mg/dL — ABNORMAL HIGH (ref 0.76–1.27)
GFR calc Af Amer: 70 mL/min/{1.73_m2} (ref >59)
GFR calc non Af Amer: 61 mL/min/{1.73_m2} (ref >59)
Globulin, Total: 2.6 g/dL (ref 1.5–4.5)
Glucose: 103 mg/dL — ABNORMAL HIGH (ref 65–99)
Potassium: 4.4 mmol/L (ref 3.5–5.2)
Sodium: 137 mmol/L (ref 134–144)
Total Protein: 7.1 g/dL (ref 6.0–8.5)

## 2019-11-06 LAB — CBC WITH DIFFERENTIAL/PLATELET
Basophils Absolute: 0 10*3/uL (ref 0.0–0.2)
Basos: 0 %
EOS (ABSOLUTE): 0.2 10*3/uL (ref 0.0–0.4)
Eos: 2 %
Hematocrit: 36.1 % — ABNORMAL LOW (ref 37.5–51.0)
Hemoglobin: 12.6 g/dL — ABNORMAL LOW (ref 13.0–17.7)
Immature Grans (Abs): 0 10*3/uL (ref 0.0–0.1)
Immature Granulocytes: 0 %
Lymphocytes Absolute: 2.3 10*3/uL (ref 0.7–3.1)
Lymphs: 24 %
MCH: 31.4 pg (ref 26.6–33.0)
MCHC: 34.9 g/dL (ref 31.5–35.7)
MCV: 90 fL (ref 79–97)
Monocytes Absolute: 0.5 10*3/uL (ref 0.1–0.9)
Monocytes: 5 %
Neutrophils Absolute: 6.5 10*3/uL (ref 1.4–7.0)
Neutrophils: 69 %
Platelets: 323 10*3/uL (ref 150–450)
RBC: 4.01 x10E6/uL — ABNORMAL LOW (ref 4.14–5.80)
RDW: 13.5 % (ref 11.6–15.4)
WBC: 9.5 10*3/uL (ref 3.4–10.8)

## 2019-11-06 LAB — TSH: TSH: 2.09 u[IU]/mL (ref 0.450–4.500)

## 2019-11-06 LAB — LIPID PANEL
Chol/HDL Ratio: 6.7 ratio — ABNORMAL HIGH (ref 0.0–5.0)
Cholesterol, Total: 200 mg/dL — ABNORMAL HIGH (ref 100–199)
HDL: 30 mg/dL — ABNORMAL LOW (ref >39)
Triglycerides: 872 mg/dL (ref 0–149)

## 2019-11-14 IMAGING — DX DG CHEST 2V
2 series · 2 of 2 positions shown · non-contrast
Comparison: 05/18/2015, 03/12/2003.

CLINICAL DATA: Essential hypertension. Hypercholesterolemia. Health
maintenance.

EXAM:
CHEST - 2 VIEW

[chest pa]
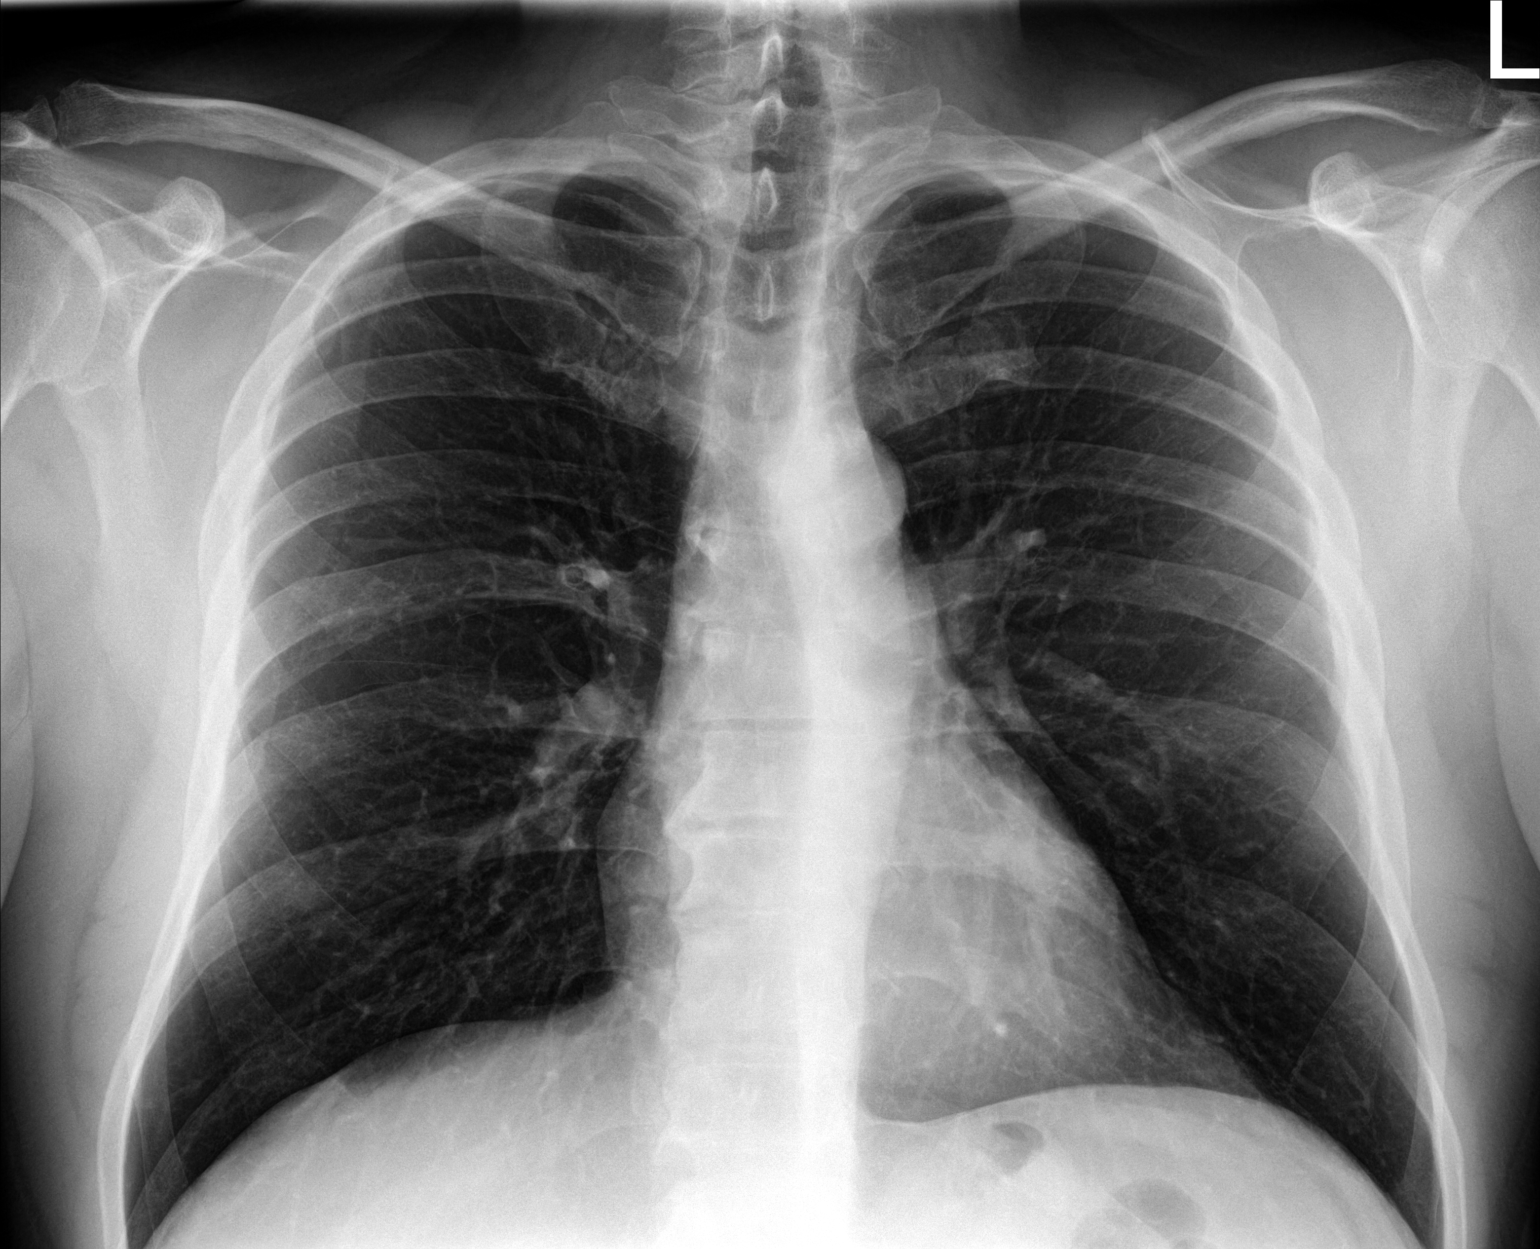

[chest lat]
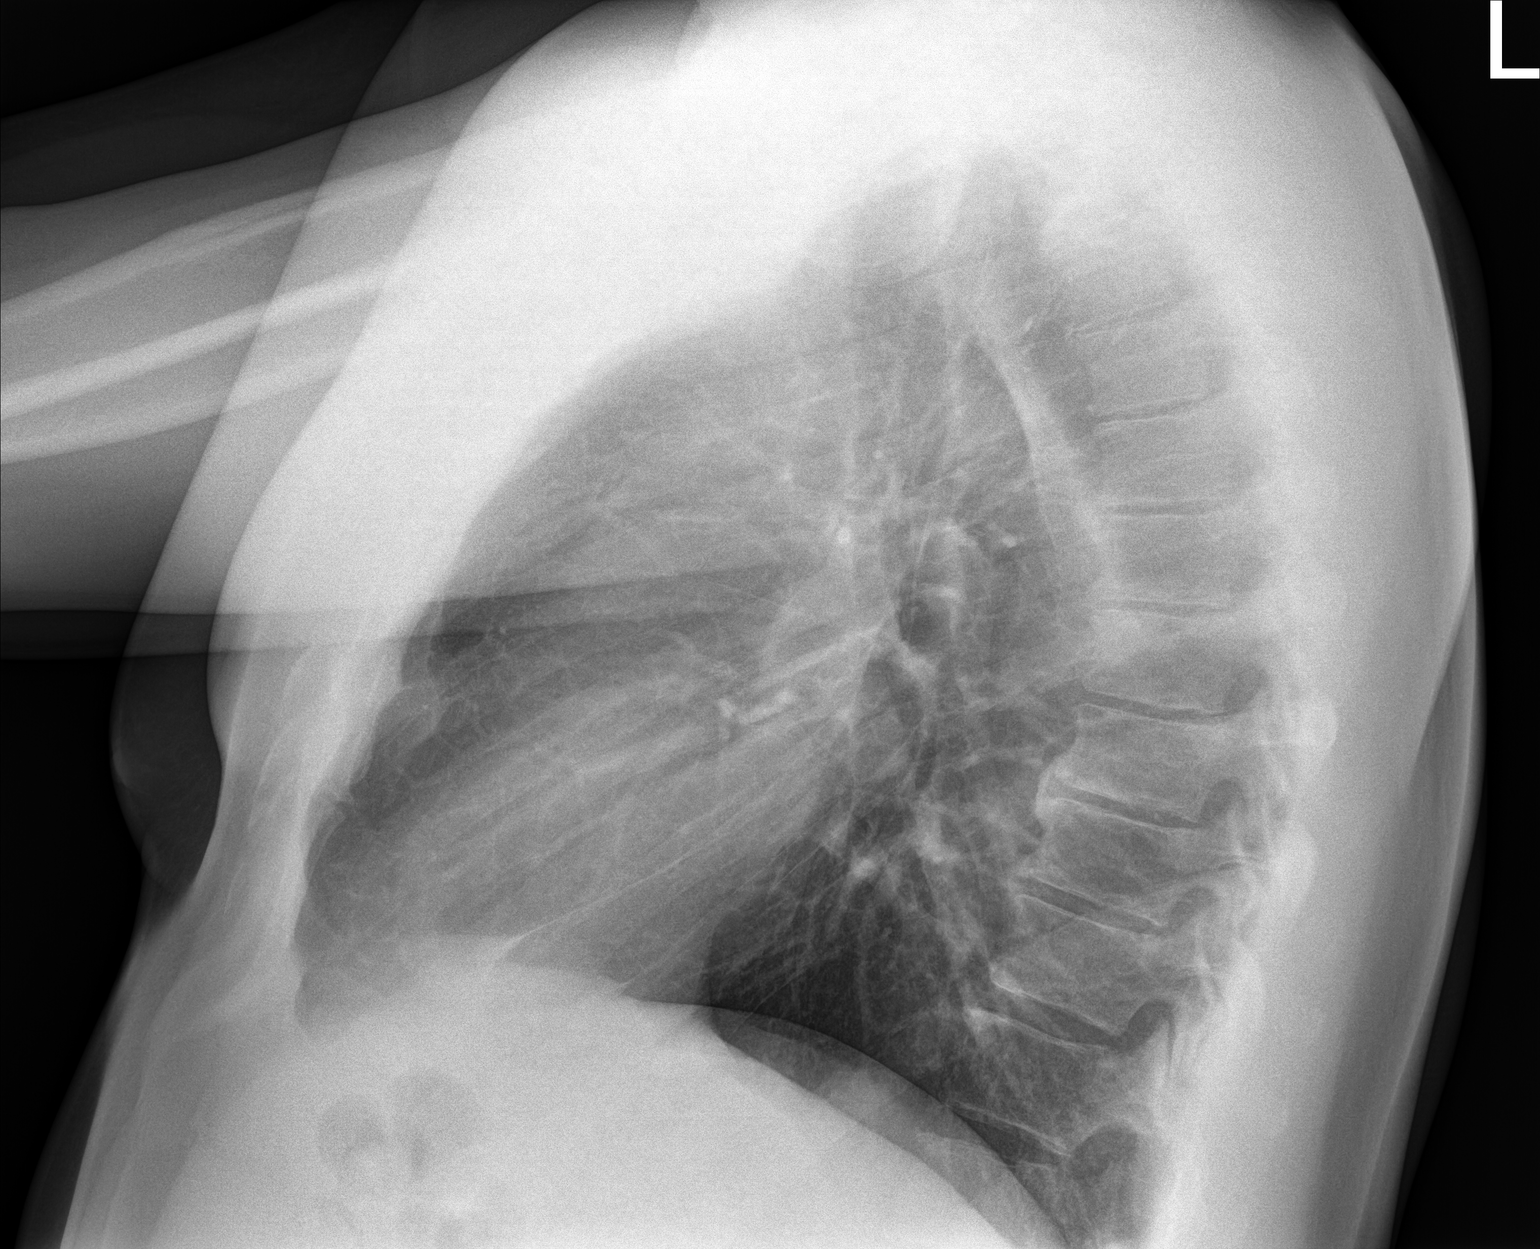

[2 of 2 positions shown; findings below may reference images not displayed]

FINDINGS: Cardiomediastinal silhouette unremarkable, unchanged. Lungs clear.
Bronchovascular markings normal. Pulmonary vascularity normal. No
visible pleural effusions. No pneumothorax. Degenerative changes
involving the thoracic spine. No interval change.
IMPRESSION: No acute cardiopulmonary disease.  Stable examination.

## 2019-11-27 DIAGNOSIS — M79671 Pain in right foot: Secondary | ICD-10-CM | POA: Diagnosis not present

## 2019-12-11 DIAGNOSIS — M25571 Pain in right ankle and joints of right foot: Secondary | ICD-10-CM | POA: Diagnosis not present

## 2019-12-11 DIAGNOSIS — M79671 Pain in right foot: Secondary | ICD-10-CM | POA: Diagnosis not present

## 2019-12-12 ENCOUNTER — Other Ambulatory Visit: Payer: Self-pay | Admitting: Family

## 2019-12-12 NOTE — Telephone Encounter (Signed)
Last seen by Rakes for this-has only seen you for acute visits since then.

## 2020-01-20 DIAGNOSIS — M25571 Pain in right ankle and joints of right foot: Secondary | ICD-10-CM | POA: Diagnosis not present

## 2020-02-19 DIAGNOSIS — M25571 Pain in right ankle and joints of right foot: Secondary | ICD-10-CM | POA: Diagnosis not present

## 2020-02-24 ENCOUNTER — Other Ambulatory Visit: Payer: BC Managed Care – PPO

## 2020-02-24 ENCOUNTER — Other Ambulatory Visit: Payer: Self-pay

## 2020-02-24 DIAGNOSIS — E782 Mixed hyperlipidemia: Secondary | ICD-10-CM | POA: Diagnosis not present

## 2020-02-25 LAB — LIPID PANEL
Chol/HDL Ratio: 5.2 ratio — ABNORMAL HIGH (ref 0.0–5.0)
Cholesterol, Total: 191 mg/dL (ref 100–199)
HDL: 37 mg/dL — ABNORMAL LOW (ref >39)
LDL Chol Calc (NIH): 119 mg/dL — ABNORMAL HIGH (ref 0–99)
Triglycerides: 195 mg/dL — ABNORMAL HIGH (ref 0–149)
VLDL Cholesterol Cal: 35 mg/dL (ref 5–40)

## 2020-02-26 ENCOUNTER — Encounter: Payer: Self-pay | Admitting: Family Medicine

## 2020-02-26 ENCOUNTER — Ambulatory Visit: Payer: BC Managed Care – PPO | Admitting: Family Medicine

## 2020-02-26 ENCOUNTER — Other Ambulatory Visit: Payer: Self-pay

## 2020-02-26 DIAGNOSIS — M545 Low back pain, unspecified: Secondary | ICD-10-CM

## 2020-02-26 DIAGNOSIS — G8929 Other chronic pain: Secondary | ICD-10-CM | POA: Diagnosis not present

## 2020-02-26 MED ORDER — METHOCARBAMOL 500 MG PO TABS: ORAL_TABLET | ORAL | 3 refills | Status: DC

## 2020-02-26 NOTE — Progress Notes (Signed)
BP 97/63   Pulse 64   Temp 97.7 F (36.5 C)   Ht 5\' 11"  (1.803 m)   Wt 220 lb (99.8 kg)   SpO2 98%   BMI 30.68 kg/m    Subjective:   Patient ID: Melvin Roberts, male    DOB: 04-24-1960, 59 y.o.   MRN: [id number]  HPI: Melvin Roberts is a 59 y.o. male presenting on 02/26/2020 for Back Pain   HPI Patient comes in complaining of bilateral low back pain.  He says this started last week when he was trying to pump up his tires with a manual pump on his car and he said after that it started hurting and then it got to be where it was really bad yesterday but he is taking some muscle relaxers and is better today.  Relevant past medical, surgical, family and social history reviewed and updated as indicated. Interim medical history since our last visit reviewed. Allergies and medications reviewed and updated.  Review of Systems  Constitutional: Negative for chills and fever.  Respiratory: Negative for shortness of breath and wheezing.   Cardiovascular: Negative for chest pain and leg swelling.  Musculoskeletal: Positive for back pain. Negative for gait problem.  Skin: Negative for rash.  Neurological: Negative for weakness and numbness.  All other systems reviewed and are negative.   Per HPI unless specifically indicated above   Allergies as of 02/26/2020      Reactions   Lipitor [atorvastatin]    Niaspan [niacin Er]       Medication List       Accurate as of February 26, 2020 11:41 AM. If you have any questions, ask your nurse or doctor.        cholecalciferol 1000 units tablet Commonly known as: VITAMIN D Take 1,000 Units by mouth daily.   fenofibrate 48 MG tablet Commonly known as: TRICOR Take 1 tablet (48 mg total) by mouth daily.   lisinopril 10 MG tablet Commonly known as: ZESTRIL TAKE 1 TABLET (10 MG TOTAL) BY MOUTH DAILY. (NEED TO SEEN NEW PROVIDER)   lithium 300 MG tablet TAKE 1 TABLET (300 MG TOTAL) BY MOUTH 3 (THREE) TIMES DAILY.     meloxicam 15 MG tablet Commonly known as: MOBIC Take 15 mg by mouth daily as needed.   methocarbamol 500 MG tablet Commonly known as: ROBAXIN TAKE 1 TABLET BY MOUTH TWICE A DAY AS NEEDED FOR SPASMS (DAYTIME)   pantoprazole 40 MG tablet Commonly known as: PROTONIX Take 40 mg by mouth daily.        Objective:   BP 97/63   Pulse 64   Temp 97.7 F (36.5 C)   Ht 5\' 11"  (1.803 m)   Wt 220 lb (99.8 kg)   SpO2 98%   BMI 30.68 kg/m   Wt Readings from Last 3 Encounters:  02/26/20 220 lb (99.8 kg)  11/04/19 235 lb (106.6 kg)  04/15/19 235 lb (106.6 kg)    Physical Exam Vitals and nursing note reviewed.  Constitutional:      General: He is not in acute distress.    Appearance: He is well-developed. He is not diaphoretic.  Eyes:     General: No scleral icterus.    Conjunctiva/sclera: Conjunctivae normal.  Neck:     Thyroid: No thyromegaly.  Musculoskeletal:        General: Normal range of motion.     Lumbar back: Spasms and tenderness (Lumbar tenderness, b/l) present. No deformity or lacerations.  Skin:  General: Skin is warm and dry.     Findings: No rash.  Neurological:     Mental Status: He is alert and oriented to person, place, and time.     Coordination: Coordination normal.  Psychiatric:        Behavior: Behavior normal.       Assessment & Plan:   Problem List Items Addressed This Visit      Other   LOW BACK PAIN, CHRONIC   Relevant Medications   methocarbamol (ROBAXIN) 500 MG tablet      Is is is back 30 getting better with Robaxin and she is coming in mainly for refill for it.  He says it does help when he gets these flareups. Follow up plan: Return if symptoms worsen or fail to improve.  Counseling provided for all of the vaccine components No orders of the defined types were placed in this encounter.   Arville Care, MD [facility] 02/26/2020, 11:41 AM

## 2020-03-02 DIAGNOSIS — F439 Reaction to severe stress, unspecified: Secondary | ICD-10-CM | POA: Diagnosis not present

## 2020-03-15 ENCOUNTER — Other Ambulatory Visit: Payer: Self-pay | Admitting: Family Medicine

## 2020-03-16 DIAGNOSIS — F439 Reaction to severe stress, unspecified: Secondary | ICD-10-CM | POA: Diagnosis not present

## 2020-04-27 DIAGNOSIS — F439 Reaction to severe stress, unspecified: Secondary | ICD-10-CM | POA: Diagnosis not present

## 2020-05-08 ENCOUNTER — Telehealth: Payer: Self-pay

## 2020-05-08 DIAGNOSIS — I10 Essential (primary) hypertension: Secondary | ICD-10-CM

## 2020-05-08 MED ORDER — LISINOPRIL 10 MG PO TABS: 10.0000 mg | ORAL_TABLET | Freq: Every day | ORAL | 1 refills | Status: DC

## 2020-05-08 NOTE — Telephone Encounter (Signed)
NA/NVM, refills sent to pharmacy

## 2020-05-08 NOTE — Telephone Encounter (Signed)
  Prescription Request  05/08/2020  What is the name of the medication or equipment? Lisinopril  Have you contacted your pharmacy to request a refill? (if applicable) Yes  Which pharmacy would you like this sent to? [facility]  Pt had his annual exam with Je on 11/04/19 but no refills were called in at that time because pt still had refills. Pt is now out of refills and needs more sent to pharmacy.  Please contact patient once refills are sent in.

## 2020-05-25 DIAGNOSIS — F439 Reaction to severe stress, unspecified: Secondary | ICD-10-CM | POA: Diagnosis not present

## 2020-05-27 DIAGNOSIS — M25571 Pain in right ankle and joints of right foot: Secondary | ICD-10-CM | POA: Diagnosis not present

## 2020-05-28 ENCOUNTER — Telehealth: Payer: Self-pay

## 2020-05-28 ENCOUNTER — Other Ambulatory Visit: Payer: Self-pay | Admitting: Family Medicine

## 2020-05-28 DIAGNOSIS — E782 Mixed hyperlipidemia: Secondary | ICD-10-CM

## 2020-05-28 DIAGNOSIS — I10 Essential (primary) hypertension: Secondary | ICD-10-CM

## 2020-05-28 DIAGNOSIS — F3170 Bipolar disorder, currently in remission, most recent episode unspecified: Secondary | ICD-10-CM

## 2020-05-28 DIAGNOSIS — E559 Vitamin D deficiency, unspecified: Secondary | ICD-10-CM

## 2020-05-28 NOTE — Telephone Encounter (Signed)
Orders are in

## 2020-05-28 NOTE — Telephone Encounter (Signed)
Pt has apt with [facility] 06/08/2020 he wants to come in tomorrow or Monday because for bloodwork. He will be off work those days. Please call back

## 2020-05-28 NOTE — Telephone Encounter (Signed)
Patient aware and orders placed per Dr. Darlyn Read

## 2020-05-28 NOTE — Telephone Encounter (Signed)
What orders would you like put in? 

## 2020-05-29 ENCOUNTER — Other Ambulatory Visit: Payer: Self-pay

## 2020-05-29 ENCOUNTER — Other Ambulatory Visit: Payer: BC Managed Care – PPO

## 2020-05-29 DIAGNOSIS — E782 Mixed hyperlipidemia: Secondary | ICD-10-CM | POA: Diagnosis not present

## 2020-05-29 DIAGNOSIS — F3170 Bipolar disorder, currently in remission, most recent episode unspecified: Secondary | ICD-10-CM | POA: Diagnosis not present

## 2020-05-29 DIAGNOSIS — E559 Vitamin D deficiency, unspecified: Secondary | ICD-10-CM | POA: Diagnosis not present

## 2020-05-29 DIAGNOSIS — I10 Essential (primary) hypertension: Secondary | ICD-10-CM | POA: Diagnosis not present

## 2020-05-30 LAB — CBC WITH DIFFERENTIAL/PLATELET
Basophils Absolute: 0 10*3/uL (ref 0.0–0.2)
Basos: 1 %
EOS (ABSOLUTE): 0.2 10*3/uL (ref 0.0–0.4)
Eos: 2 %
Hematocrit: 39.7 % (ref 37.5–51.0)
Hemoglobin: 13.1 g/dL (ref 13.0–17.7)
Immature Grans (Abs): 0 10*3/uL (ref 0.0–0.1)
Immature Granulocytes: 0 %
Lymphocytes Absolute: 1.5 10*3/uL (ref 0.7–3.1)
Lymphs: 17 %
MCH: 29.8 pg (ref 26.6–33.0)
MCHC: 33 g/dL (ref 31.5–35.7)
MCV: 90 fL (ref 79–97)
Monocytes Absolute: 0.4 10*3/uL (ref 0.1–0.9)
Monocytes: 4 %
Neutrophils Absolute: 6.6 10*3/uL (ref 1.4–7.0)
Neutrophils: 76 %
Platelets: 278 10*3/uL (ref 150–450)
RBC: 4.39 x10E6/uL (ref 4.14–5.80)
RDW: 12.8 % (ref 11.6–15.4)
WBC: 8.7 10*3/uL (ref 3.4–10.8)

## 2020-05-30 LAB — CMP14+EGFR
ALT: 34 IU/L (ref 0–44)
AST: 29 IU/L (ref 0–40)
Albumin/Globulin Ratio: 1.7 (ref 1.2–2.2)
Albumin: 4.5 g/dL (ref 3.8–4.9)
Alkaline Phosphatase: 86 IU/L (ref 44–121)
BUN/Creatinine Ratio: 16 (ref 9–20)
BUN: 23 mg/dL (ref 6–24)
Bilirubin Total: 0.3 mg/dL (ref 0.0–1.2)
CO2: 18 mmol/L — ABNORMAL LOW (ref 20–29)
Calcium: 10.7 mg/dL — ABNORMAL HIGH (ref 8.7–10.2)
Chloride: 105 mmol/L (ref 96–106)
Creatinine, Ser: 1.44 mg/dL — ABNORMAL HIGH (ref 0.76–1.27)
GFR calc Af Amer: 61 mL/min/{1.73_m2} (ref >59)
GFR calc non Af Amer: 53 mL/min/{1.73_m2} — ABNORMAL LOW (ref >59)
Globulin, Total: 2.6 g/dL (ref 1.5–4.5)
Glucose: 100 mg/dL — ABNORMAL HIGH (ref 65–99)
Potassium: 5.1 mmol/L (ref 3.5–5.2)
Sodium: 138 mmol/L (ref 134–144)
Total Protein: 7.1 g/dL (ref 6.0–8.5)

## 2020-05-30 LAB — LIPID PANEL
Chol/HDL Ratio: 5.4 ratio — ABNORMAL HIGH (ref 0.0–5.0)
Cholesterol, Total: 195 mg/dL (ref 100–199)
HDL: 36 mg/dL — ABNORMAL LOW (ref >39)
LDL Chol Calc (NIH): 111 mg/dL — ABNORMAL HIGH (ref 0–99)
Triglycerides: 276 mg/dL — ABNORMAL HIGH (ref 0–149)
VLDL Cholesterol Cal: 48 mg/dL — ABNORMAL HIGH (ref 5–40)

## 2020-05-30 LAB — LITHIUM LEVEL: Lithium Lvl: 1.5 mmol/L (ref 0.5–1.2)

## 2020-05-30 LAB — VITAMIN D 25 HYDROXY (VIT D DEFICIENCY, FRACTURES): Vit D, 25-Hydroxy: 53.7 ng/mL (ref 30.0–100.0)

## 2020-06-01 DIAGNOSIS — M19071 Primary osteoarthritis, right ankle and foot: Secondary | ICD-10-CM | POA: Diagnosis not present

## 2020-06-07 ENCOUNTER — Other Ambulatory Visit: Payer: Self-pay | Admitting: Family Medicine

## 2020-06-08 ENCOUNTER — Other Ambulatory Visit: Payer: Self-pay

## 2020-06-08 ENCOUNTER — Encounter: Payer: Self-pay | Admitting: Family Medicine

## 2020-06-08 ENCOUNTER — Ambulatory Visit: Payer: BC Managed Care – PPO | Admitting: Family Medicine

## 2020-06-08 VITALS — BP 106/65 | HR 53 | Temp 97.6°F | Resp 20 | Ht 71.0 in | Wt 222.1 lb

## 2020-06-08 DIAGNOSIS — K219 Gastro-esophageal reflux disease without esophagitis: Secondary | ICD-10-CM | POA: Diagnosis not present

## 2020-06-08 DIAGNOSIS — F3170 Bipolar disorder, currently in remission, most recent episode unspecified: Secondary | ICD-10-CM

## 2020-06-08 DIAGNOSIS — I10 Essential (primary) hypertension: Secondary | ICD-10-CM

## 2020-06-08 MED ORDER — LITHIUM CARBONATE 300 MG PO TABS: 600.0000 mg | ORAL_TABLET | Freq: Every day | ORAL | 1 refills | Status: DC

## 2020-06-08 MED ORDER — FENOFIBRATE 145 MG PO TABS: 145.0000 mg | ORAL_TABLET | Freq: Every day | ORAL | 1 refills | Status: DC

## 2020-06-08 NOTE — Progress Notes (Signed)
Subjective:  Patient ID: Melvin Roberts, male    DOB: Sep 29, 1960  Age: 60 y.o. MRN: [id number]  CC: No chief complaint on file.   HPI Melvin Roberts presents for  follow-up of hypertension. Patient has no history of headache chest pain or shortness of breath or recent cough. Patient also denies symptoms of TIA such as focal numbness or weakness. Patient denies side effects from medication. States taking it regularly.  Patient in for follow-up of GERD. Currently asymptomatic taking  PPI daily. There is no chest pain or heartburn. No hematemesis and no melena. No dysphagia or choking. Onset is remote. Progression is stable. Complicating factors, none.  Also followed for triglycerides. Taking fenofibrate.   Also tx with lithium for bip0olar 0presen0t s0ince he was a teen.    History Melvin Roberts has a past medical history of Bipolar 1 disorder (HCC), Hyperlipidemia, and Hypertension.   He has a past surgical history that includes Back surgery (11/17/95); Hernia repair (2009); Eye surgery; and Upper gastrointestinal endoscopy (x2).   His family history includes Cancer in his father, maternal grandfather, mother, and paternal grandfather.He reports that he has never smoked. He has never used smokeless tobacco. He reports that he does not drink alcohol and does not use drugs.  Current Outpatient Medications on File Prior to Visit  Medication Sig Dispense Refill  . cholecalciferol (VITAMIN D) 1000 UNITS tablet Take 1,000 Units by mouth daily.    Marland Kitchen lisinopril (ZESTRIL) 10 MG tablet Take 1 tablet (10 mg total) by mouth daily. 90 tablet 1  . methocarbamol (ROBAXIN) 500 MG tablet TAKE 1 TABLET BY MOUTH TWICE A DAY AS NEEDED FOR SPASMS (DAYTIME) 60 tablet 3  . pantoprazole (PROTONIX) 40 MG tablet Take 40 mg by mouth daily.     No current facility-administered medications on file prior to visit.    ROS Review of Systems  Constitutional: Positive for fatigue (Tired driving home. Fears  driving off the road.).  HENT: Negative.   Eyes: Negative for visual disturbance.  Respiratory: Negative for cough and shortness of breath.   Cardiovascular: Negative for chest pain and leg swelling.  Gastrointestinal: Negative for abdominal pain, diarrhea, nausea and vomiting.  Genitourinary: Negative for difficulty urinating.  Musculoskeletal: Negative for arthralgias and myalgias.  Skin: Negative for rash.  Neurological: Negative for headaches.  Psychiatric/Behavioral: Negative for sleep disturbance.    Objective:  BP 106/65   Pulse (!) 53   Temp 97.6 F (36.4 C) (Temporal)   Resp 20   Ht 5\' 11"  (1.803 m)   Wt 222 lb 2 oz (100.8 kg)   SpO2 98%   BMI 30.98 kg/m   BP Readings from Last 3 Encounters:  06/08/20 106/65  02/26/20 97/63  11/04/19 116/75    Wt Readings from Last 3 Encounters:  06/08/20 222 lb 2 oz (100.8 kg)  02/26/20 220 lb (99.8 kg)  11/04/19 235 lb (106.6 kg)     Physical Exam Vitals reviewed.  Constitutional:      Appearance: He is well-developed and well-nourished.  HENT:     Head: Normocephalic and atraumatic.     Right Ear: Tympanic membrane and external ear normal. No decreased hearing noted.     Left Ear: Tympanic membrane and external ear normal. No decreased hearing noted.     Mouth/Throat:     Pharynx: No oropharyngeal exudate or posterior oropharyngeal erythema.  Eyes:     Pupils: Pupils are equal, round, and reactive to light.  Cardiovascular:  Rate and Rhythm: Normal rate and regular rhythm.     Heart sounds: No murmur heard.   Pulmonary:     Effort: No respiratory distress.     Breath sounds: Normal breath sounds.  Abdominal:     General: Bowel sounds are normal.     Palpations: Abdomen is soft. There is no mass.     Tenderness: There is no abdominal tenderness.  Musculoskeletal:     Cervical back: Normal range of motion and neck supple.       Assessment & Plan:   Diagnoses and all orders for this visit:  Primary  hypertension  Bipolar affective disorder in remission (HCC) -     Lithium level; Future  Gastroesophageal reflux disease without esophagitis  Other orders -     lithium 300 MG tablet; Take 2 tablets (600 mg total) by mouth daily. -     fenofibrate (TRICOR) 145 MG tablet; Take 1 tablet (145 mg total) by mouth daily.   Allergies as of 06/08/2020      Reactions   Lipitor [atorvastatin]    Niaspan [niacin Er]       Medication List       Accurate as of June 08, 2020  1:21 PM. If you have any questions, ask your nurse or doctor.        STOP taking these medications   meloxicam 15 MG tablet Commonly known as: MOBIC Stopped by: Mechele Claude, MD     TAKE these medications   cholecalciferol 1000 units tablet Commonly known as: VITAMIN D Take 1,000 Units by mouth daily.   fenofibrate 145 MG tablet Commonly known as: TRICOR Take 1 tablet (145 mg total) by mouth daily. What changed:   medication strength  how much to take Changed by: Mechele Claude, MD   lisinopril 10 MG tablet Commonly known as: ZESTRIL Take 1 tablet (10 mg total) by mouth daily.   lithium 300 MG tablet Take 2 tablets (600 mg total) by mouth daily. What changed: how much to take Changed by: Mechele Claude, MD   methocarbamol 500 MG tablet Commonly known as: ROBAXIN TAKE 1 TABLET BY MOUTH TWICE A DAY AS NEEDED FOR SPASMS (DAYTIME)   pantoprazole 40 MG tablet Commonly known as: PROTONIX Take 40 mg by mouth daily.       Meds ordered this encounter  Medications  . lithium 300 MG tablet    Sig: Take 2 tablets (600 mg total) by mouth daily.    Dispense:  180 tablet    Refill:  1    Corrected scrip. Replaces previous SIG  . fenofibrate (TRICOR) 145 MG tablet    Sig: Take 1 tablet (145 mg total) by mouth daily.    Dispense:  90 tablet    Refill:  1      Follow-up: Return in about 6 months (around 12/06/2020).  Mechele Claude, M.D.

## 2020-06-22 ENCOUNTER — Other Ambulatory Visit: Payer: BC Managed Care – PPO

## 2020-06-22 ENCOUNTER — Other Ambulatory Visit: Payer: Self-pay

## 2020-06-22 DIAGNOSIS — F3170 Bipolar disorder, currently in remission, most recent episode unspecified: Secondary | ICD-10-CM

## 2020-06-23 LAB — LITHIUM LEVEL: Lithium Lvl: 0.9 mmol/L (ref 0.5–1.2)

## 2020-06-23 NOTE — Progress Notes (Signed)
Mail box not set up ac

## 2020-07-02 ENCOUNTER — Ambulatory Visit: Payer: BC Managed Care – PPO | Admitting: Nurse Practitioner

## 2020-07-02 ENCOUNTER — Encounter: Payer: Self-pay | Admitting: Nurse Practitioner

## 2020-07-02 ENCOUNTER — Other Ambulatory Visit: Payer: Self-pay

## 2020-07-02 VITALS — BP 117/75 | HR 67 | Temp 98.4°F | Resp 20 | Ht 71.0 in | Wt 223.0 lb

## 2020-07-02 DIAGNOSIS — M545 Low back pain, unspecified: Secondary | ICD-10-CM

## 2020-07-02 MED ORDER — PREDNISONE 20 MG PO TABS: 40.0000 mg | ORAL_TABLET | Freq: Every day | ORAL | 0 refills | Status: AC

## 2020-07-02 MED ORDER — METHYLPREDNISOLONE ACETATE 80 MG/ML IJ SUSP
80.0000 mg | Freq: Once | INTRAMUSCULAR | Status: AC
  Administered 2020-07-02: 80 mg via INTRAMUSCULAR

## 2020-07-02 NOTE — Patient Instructions (Signed)
Acute Back Pain, Adult Acute back pain is sudden and usually short-lived. It is often caused by an injury to the muscles and tissues in the back. The injury may result from:  A muscle or ligament getting overstretched or torn (strained). Ligaments are tissues that connect bones to each other. Lifting something improperly can cause a back strain.  Wear and tear (degeneration) of the spinal disks. Spinal disks are circular tissue that provide cushioning between the bones of the spine (vertebrae).  Twisting motions, such as while playing sports or doing yard work.  A hit to the back.  Arthritis. You may have a physical exam, lab tests, and imaging tests to find the cause of your pain. Acute back pain usually goes away with rest and home care. Follow these instructions at home: Managing pain, stiffness, and swelling  Treatment may include medicines for pain and inflammation that are taken by mouth or applied to the skin, prescription pain medicine, or muscle relaxants. Take over-the-counter and prescription medicines only as told by your health care provider.  Your health care provider may recommend applying ice during the first 24-48 hours after your pain starts. To do this: ? Put ice in a plastic bag. ? Place a towel between your skin and the bag. ? Leave the ice on for 20 minutes, 2-3 times a day.  If directed, apply heat to the affected area as often as told by your health care provider. Use the heat source that your health care provider recommends, such as a moist heat pack or a heating pad. ? Place a towel between your skin and the heat source. ? Leave the heat on for 20-30 minutes. ? Remove the heat if your skin turns bright red. This is especially important if you are unable to feel pain, heat, or cold. You have a greater risk of getting burned. Activity  Do not stay in bed. Staying in bed for more than 1-2 days can delay your recovery.  Sit up and stand up straight. Avoid leaning  forward when you sit or hunching over when you stand. ? If you work at a desk, sit close to it so you do not need to lean over. Keep your chin tucked in. Keep your neck drawn back, and keep your elbows bent at a 90-degree angle (right angle). ? Sit high and close to the steering wheel when you drive. Add lower back (lumbar) support to your car seat, if needed.  Take short walks on even surfaces as soon as you are able. Try to increase the length of time you walk each day.  Do not sit, drive, or stand in one place for more than 30 minutes at a time. Sitting or standing for long periods of time can put stress on your back.  Do not drive or use heavy machinery while taking prescription pain medicine.  Use proper lifting techniques. When you bend and lift, use positions that put less stress on your back: ? Bend your knees. ? Keep the load close to your body. ? Avoid twisting.  Exercise regularly as told by your health care provider. Exercising helps your back heal faster and helps prevent back injuries by keeping muscles strong and flexible.  Work with a physical therapist to make a safe exercise program, as recommended by your health care provider. Do any exercises as told by your physical therapist.   Lifestyle  Maintain a healthy weight. Extra weight puts stress on your back and makes it difficult to have   good posture.  Avoid activities or situations that make you feel anxious or stressed. Stress and anxiety increase muscle tension and can make back pain worse. Learn ways to manage anxiety and stress, such as through exercise. General instructions  Sleep on a firm mattress in a comfortable position. Try lying on your side with your knees slightly bent. If you lie on your back, put a pillow under your knees.  Follow your treatment plan as told by your health care provider. This may include: ? Cognitive or behavioral therapy. ? Acupuncture or massage therapy. ? Meditation or yoga. Contact  a health care provider if:  You have pain that is not relieved with rest or medicine.  You have increasing pain going down into your legs or buttocks.  Your pain does not improve after 2 weeks.  You have pain at night.  You lose weight without trying.  You have a fever or chills. Get help right away if:  You develop new bowel or bladder control problems.  You have unusual weakness or numbness in your arms or legs.  You develop nausea or vomiting.  You develop abdominal pain.  You feel faint. Summary  Acute back pain is sudden and usually short-lived.  Use proper lifting techniques. When you bend and lift, use positions that put less stress on your back.  Take over-the-counter and prescription medicines and apply heat or ice as directed by your health care provider. This information is not intended to replace advice given to you by your health care provider. Make sure you discuss any questions you have with your health care provider. Document Revised: 12/27/2019 Document Reviewed: 12/27/2019 Elsevier Patient Education  2021 [employer]vier Inc.  

## 2020-07-02 NOTE — Progress Notes (Addendum)
   Subjective:     Patient ID: Melvin Roberts, male    DOB: 04-19-1960, 60 y.o.   MRN: [id number]  Chief Complaint: Back Pain (Low back pain/)   HPI  Pt presents today with c/o lower back pain. He was helping move a cast iron tub on Monday. The next day he noticed back pain located on bilateral lumbar region. He has been taking robaxin and ibuprofen and tylenol with some relief. He believes the pain is slowly getting better. The lower right side is more severe than the left. He has taken a few days off from work. He would like to return to work, but may need lifting limits. He will need a work note. Pain scale is a 6/10 today.    Review of Systems  Constitutional: Negative for chills and fever.  Respiratory: Negative for cough, shortness of breath and wheezing.   Cardiovascular: Negative for chest pain and palpitations.  Musculoskeletal: Positive for back pain. Negative for arthralgias and gait problem.  Neurological: Negative for dizziness, syncope, numbness and headaches.  Psychiatric/Behavioral: Negative for behavioral problems. The patient is not nervous/anxious.        Objective:   Physical Exam Cardiovascular:     Rate and Rhythm: Normal rate and regular rhythm.     Pulses: Normal pulses.  Pulmonary:     Effort: Pulmonary effort is normal.     Breath sounds: Normal breath sounds.  Abdominal:     General: Abdomen is flat. Bowel sounds are normal.     Palpations: Abdomen is soft.  Musculoskeletal:     Cervical back: Normal range of motion.     Lumbar back: Tenderness present. Normal range of motion. Negative right straight leg raise test and negative left straight leg raise test.     Comments: Motor skills and neuro sensations intact  Skin:    General: Skin is warm and dry.     Capillary Refill: Capillary refill takes less than 2 seconds.  Neurological:     Mental Status: He is alert and oriented to person, place, and time.  Psychiatric:        Mood and Affect:  Mood normal.        Behavior: Behavior normal.     Vitals:   07/02/20 1340  BP: 117/75  Pulse: 67  Resp: 20  Temp: 98.4 F (36.9 C)  SpO2: 99%      Assessment & Plan:   MUSTAPHA COLSON comes in today with chief complaint of Back Pain (Low back pain/)   Diagnosis and orders addressed:  1. Acute midline low back pain without sciatica Begin prednisone taper. Avoid heavy lifting.   - methylPREDNISolone acetate (DEPO-MEDROL) injection 80 mg - predniSONE (DELTASONE) 20 MG tablet; Take 2 tablets (40 mg total) by mouth daily with breakfast for 5 days. 2 po daily for 5 days  Dispense: 10 tablet; Refill: 0   Labs pending Health Maintenance reviewed Diet and exercise encouraged  Follow up plan: PRN  Oretha Milch, RN, BSN, FNP-Student  Mary-Margaret Daphine Deutscher, FNP- I agree with care plan

## 2020-08-03 DIAGNOSIS — F439 Reaction to severe stress, unspecified: Secondary | ICD-10-CM | POA: Diagnosis not present

## 2020-08-10 ENCOUNTER — Other Ambulatory Visit: Payer: Self-pay

## 2020-08-10 ENCOUNTER — Encounter: Payer: Self-pay | Admitting: Family Medicine

## 2020-08-10 ENCOUNTER — Ambulatory Visit: Payer: BC Managed Care – PPO | Admitting: Family Medicine

## 2020-08-10 VITALS — BP 109/69 | HR 54 | Temp 98.2°F | Ht 71.0 in | Wt 220.6 lb

## 2020-08-10 DIAGNOSIS — F3171 Bipolar disorder, in partial remission, most recent episode hypomanic: Secondary | ICD-10-CM

## 2020-08-10 MED ORDER — QUETIAPINE FUMARATE 50 MG PO TABS: 50.0000 mg | ORAL_TABLET | Freq: Every day | ORAL | 1 refills | Status: DC

## 2020-08-10 NOTE — Progress Notes (Signed)
Subjective:  Patient ID: Melvin Roberts, male    DOB: [birth year]  Age: 60 y.o. MRN: [id number]  CC: Medication Problem   HPI Melvin Roberts presents for feeling scattered. Can't concentrate. "All over the place." Therapist can't follow him. Sent him back to me to ask that medication be adjusted.  Feeling much better now. Melvin Roberts feels the lithium made him fatigued and foggy at the higher dose. That is no longer present, but Melvin Roberts does have trouble with focusing.  Depression screen Melvin Roberts 2/9 08/10/2020 08/10/2020 07/02/2020  Decreased Interest 0 0 0  Down, Depressed, Hopeless 0 0 0  PHQ - 2 Score 0 0 0  Altered sleeping 1 - -  Tired, decreased energy 0 - -  Change in appetite 0 - -  Feeling bad or failure about yourself  0 - -  Trouble concentrating 2 - -  Moving slowly or fidgety/restless 0 - -  Suicidal thoughts 0 - -  PHQ-9 Score 3 - -  Difficult doing work/chores Somewhat difficult - -    History Melvin Roberts has a past medical history of Bipolar 1 disorder (HCC), Hyperlipidemia, and Hypertension.   Melvin Roberts has a past surgical history that includes Back surgery (11/17/95); Hernia repair (2009); Eye surgery; and Upper gastrointestinal endoscopy (x2).   His family history includes Cancer in his father, maternal grandfather, mother, and paternal grandfather.Melvin Roberts reports that Melvin Roberts has never smoked. Melvin Roberts has never used smokeless tobacco. Melvin Roberts reports that Melvin Roberts does not drink alcohol and does not use drugs.    ROS Review of Systems  Constitutional: Negative for fever.  Respiratory: Negative for shortness of breath.   Cardiovascular: Negative for chest pain.  Musculoskeletal: Negative for arthralgias.  Skin: Negative for rash.    Objective:  BP 109/69   Pulse (!) 54   Temp 98.2 F (36.8 C)   Ht 5\' 11"  (1.803 m)   Wt 220 lb 9.6 oz (100.1 kg)   SpO2 98%   BMI 30.77 kg/m   BP Readings from Last 3 Encounters:  08/10/20 109/69  07/02/20 117/75  06/08/20 106/65    Wt Readings from Last 3  Encounters:  08/10/20 220 lb 9.6 oz (100.1 kg)  07/02/20 223 lb (101.2 kg)  06/08/20 222 lb 2 oz (100.8 kg)     Physical Exam Vitals reviewed.  Constitutional:      Appearance: Melvin Roberts is well-developed.  HENT:     Head: Normocephalic and atraumatic.     Right Ear: External ear normal.     Left Ear: External ear normal.     Mouth/Throat:     Pharynx: No oropharyngeal exudate or posterior oropharyngeal erythema.  Eyes:     Pupils: Pupils are equal, round, and reactive to light.  Cardiovascular:     Rate and Rhythm: Normal rate and regular rhythm.     Heart sounds: No murmur heard.   Pulmonary:     Effort: No respiratory distress.     Breath sounds: Normal breath sounds.  Musculoskeletal:     Cervical back: Normal range of motion and neck supple.  Neurological:     Mental Status: Melvin Roberts is alert and oriented to person, place, and time.  Psychiatric:        Speech: Speech is rapid and pressured.        Behavior: Behavior is hyperactive. Behavior is cooperative.        Judgment: Judgment is impulsive.       Assessment & Plan:   Melvin Roberts was seen today  for medication problem.  Diagnoses and all orders for this visit:  Bipolar disorder, in partial remission, most recent episode hypomanic (HCC) -     Ambulatory referral to Psychiatry  Other orders -     QUEtiapine (SEROQUEL) 50 MG tablet; Take 1 tablet (50 mg total) by mouth at bedtime.       I am having Melvin Roberts start on QUEtiapine. I am also having him maintain his cholecalciferol, pantoprazole, methocarbamol, lisinopril, lithium, and fenofibrate.  Allergies as of 08/10/2020      Reactions   Lipitor [atorvastatin]    Niaspan [niacin Er]       Medication List       Accurate as of August 10, 2020  9:37 PM. If you have any questions, ask your nurse or doctor.        cholecalciferol 1000 units tablet Commonly known as: VITAMIN D Take 1,000 Units by mouth daily.   fenofibrate 145 MG tablet Commonly  known as: TRICOR Take 1 tablet (145 mg total) by mouth daily.   lisinopril 10 MG tablet Commonly known as: ZESTRIL Take 1 tablet (10 mg total) by mouth daily.   lithium 300 MG tablet Take 2 tablets (600 mg total) by mouth daily.   methocarbamol 500 MG tablet Commonly known as: ROBAXIN TAKE 1 TABLET BY MOUTH TWICE A DAY AS NEEDED FOR SPASMS (DAYTIME)   pantoprazole 40 MG tablet Commonly known as: PROTONIX Take 40 mg by mouth daily.   QUEtiapine 50 MG tablet Commonly known as: SEROquel Take 1 tablet (50 mg total) by mouth at bedtime. Started by: Melvin Claude, MD        Follow-up: Return in about 1 month (around 09/09/2020), or if symptoms worsen or fail to improve.  Melvin Roberts, M.D.

## 2020-08-11 ENCOUNTER — Telehealth: Payer: Self-pay | Admitting: Family Medicine

## 2020-08-17 ENCOUNTER — Encounter: Payer: Self-pay | Admitting: Nurse Practitioner

## 2020-08-17 ENCOUNTER — Ambulatory Visit: Payer: BC Managed Care – PPO | Admitting: Nurse Practitioner

## 2020-08-17 DIAGNOSIS — J069 Acute upper respiratory infection, unspecified: Secondary | ICD-10-CM

## 2020-08-17 MED ORDER — DM-GUAIFENESIN ER 30-600 MG PO TB12: 1.0000 | ORAL_TABLET | Freq: Two times a day (BID) | ORAL | 0 refills | Status: DC

## 2020-08-17 MED ORDER — BENZONATATE 100 MG PO CAPS: 100.0000 mg | ORAL_CAPSULE | Freq: Two times a day (BID) | ORAL | 0 refills | Status: DC | PRN

## 2020-08-17 MED ORDER — CETIRIZINE HCL 10 MG PO TABS: 10.0000 mg | ORAL_TABLET | Freq: Every day | ORAL | 2 refills | Status: DC

## 2020-08-17 MED ORDER — FLUTICASONE PROPIONATE 50 MCG/ACT NA SUSP: 2.0000 | Freq: Every day | NASAL | 6 refills | Status: DC

## 2020-08-17 NOTE — Patient Instructions (Signed)

## 2020-08-17 NOTE — Progress Notes (Signed)
   Virtual Visit  Note Due to COVID-19 pandemic this visit was conducted virtually. This visit type was conducted due to national recommendations for restrictions regarding the COVID-19 Pandemic (e.g. social distancing, sheltering in place) in an effort to limit this patient's exposure and mitigate transmission in our community. All issues noted in this document were discussed and addressed.  A physical exam was not performed with this format.  I connected with Melvin Roberts on 08/17/20 at  10:30 AM by telephone and verified that I am speaking with the correct person using two identifiers. Melvin Roberts is currently located home during visit. The provider, Daryll Drown, NP is located in their office at time of visit.  I discussed the limitations, risks, security and privacy concerns of performing an evaluation and management service by telephone and the availability of in person appointments. I also discussed with the patient that there may be a patient responsible charge related to this service. The patient expressed understanding and agreed to proceed.   History and Present Illness:  URI  This is a recurrent problem. The current episode started in the past 7 days. The problem has been waxing and waning. Associated symptoms include congestion, coughing, ear pain and headaches. Pertinent negatives include no abdominal pain or chest pain.      Review of Systems  HENT: Positive for congestion and ear pain.   Respiratory: Positive for cough.   Cardiovascular: Negative for chest pain.  Gastrointestinal: Negative for abdominal pain.  Neurological: Positive for headaches.     Observations/Objective:  Televisit patient did not sound to be in distress. Assessment and Plan:   Follow Up Instructions:     I discussed the assessment and treatment plan with the patient. The patient was provided an opportunity to ask questions and all were answered. The patient agreed with the  plan and demonstrated an understanding of the instructions.   The patient was advised to call back or seek an in-person evaluation if the symptoms worsen or if the condition fails to improve as anticipated.  The above assessment and management plan was discussed with the patient. The patient verbalized understanding of and has agreed to the management plan. Patient is aware to call the clinic if symptoms persist or worsen. Patient is aware when to return to the clinic for a follow-up visit. Patient educated on when it is appropriate to go to the emergency department.   Time call ended:   10:36 AM  I provided 6 minutes of  non face-to-face time during this encounter.    Daryll Drown, NP

## 2020-08-18 LAB — NOVEL CORONAVIRUS, NAA: SARS-CoV-2, NAA: NOT DETECTED

## 2020-08-31 DIAGNOSIS — F439 Reaction to severe stress, unspecified: Secondary | ICD-10-CM | POA: Diagnosis not present

## 2020-09-01 ENCOUNTER — Other Ambulatory Visit: Payer: Self-pay | Admitting: Family Medicine

## 2020-09-09 DIAGNOSIS — M25562 Pain in left knee: Secondary | ICD-10-CM | POA: Diagnosis not present

## 2020-09-20 ENCOUNTER — Encounter (HOSPITAL_BASED_OUTPATIENT_CLINIC_OR_DEPARTMENT_OTHER): Payer: Self-pay | Admitting: *Deleted

## 2020-09-20 ENCOUNTER — Other Ambulatory Visit: Payer: Self-pay

## 2020-09-20 ENCOUNTER — Emergency Department (HOSPITAL_BASED_OUTPATIENT_CLINIC_OR_DEPARTMENT_OTHER)
Admission: EM | Admit: 2020-09-20 | Discharge: 2020-09-20 | Disposition: A | Discharge status: Home / Self Care | Payer: BC Managed Care – PPO | Attending: Emergency Medicine | Admitting: Emergency Medicine

## 2020-09-20 ENCOUNTER — Emergency Department (HOSPITAL_BASED_OUTPATIENT_CLINIC_OR_DEPARTMENT_OTHER): Payer: BC Managed Care – PPO

## 2020-09-20 DIAGNOSIS — Z20822 Contact with and (suspected) exposure to covid-19: Secondary | ICD-10-CM | POA: Insufficient documentation

## 2020-09-20 DIAGNOSIS — I10 Essential (primary) hypertension: Secondary | ICD-10-CM | POA: Insufficient documentation

## 2020-09-20 DIAGNOSIS — R059 Cough, unspecified: Secondary | ICD-10-CM | POA: Diagnosis not present

## 2020-09-20 DIAGNOSIS — Z79899 Other long term (current) drug therapy: Secondary | ICD-10-CM | POA: Insufficient documentation

## 2020-09-20 DIAGNOSIS — J209 Acute bronchitis, unspecified: Secondary | ICD-10-CM | POA: Diagnosis not present

## 2020-09-20 MED ORDER — DEXAMETHASONE 6 MG PO TABS
10.0000 mg | ORAL_TABLET | Freq: Once | ORAL | Status: AC
  Administered 2020-09-20: 10 mg via ORAL
  Filled 2020-09-20: qty 1

## 2020-09-20 MED ORDER — AZITHROMYCIN 250 MG PO TABS: 250.0000 mg | ORAL_TABLET | Freq: Every day | ORAL | 0 refills | Status: DC

## 2020-09-20 NOTE — ED Provider Notes (Signed)
[facility] EMERGENCY DEPARTMENT Provider Note   CSN: [id number] Arrival date & time: 09/20/20  2219     History Chief Complaint  Patient presents with  . Cough    Melvin Roberts is a 60 y.o. male.  The history is provided by the patient.  Cough Cough characteristics:  Non-productive Sputum characteristics:  Nondescript Severity:  Mild Onset quality:  Gradual Timing:  Intermittent Progression:  Waxing and waning Chronicity:  Recurrent Smoker: no   Context: upper respiratory infection (allergies)   Relieved by:  Nothing Worsened by:  Nothing Associated symptoms: sinus congestion   Associated symptoms: no chest pain, no chills, no diaphoresis, no ear fullness, no ear pain, no eye discharge, no fever, no headaches, no myalgias, no rash, no rhinorrhea, no shortness of breath, no sore throat, no weight loss and no wheezing        Past Medical History:  Diagnosis Date  . Bipolar 1 disorder (HCC)   . Hyperlipidemia   . Hypertension     Patient Active Problem List   Diagnosis Date Noted  . Upper respiratory tract infection 08/17/2020  . Vitamin D deficiency 04/15/2019  . BMI 32.0-32.9,adult 04/15/2019  . Hypertension 09/16/2013  . Bipolar disorder (HCC) 03/11/2013  . Allergic rhinitis 03/11/2013  . BPH (benign prostatic hyperplasia) 03/11/2013  . Hyperlipidemia 05/29/2007  . Depression, recurrent (HCC) 05/29/2007  . Sinusitis, chronic 05/29/2007  . ESOPHAGEAL STRICTURE 05/29/2007  . GERD 05/29/2007  . HIATAL HERNIA 05/29/2007  . LOW BACK PAIN, CHRONIC 05/29/2007    Past Surgical History:  Procedure Laterality Date  . BACK SURGERY  11/17/95   Dr. Shelle Iron  . EYE SURGERY    . HERNIA REPAIR  2009   umbilical  . UPPER GASTROINTESTINAL ENDOSCOPY  x2   w/ esophageal dilation       Family History  Problem Relation Age of Onset  . Cancer Mother        breast  . Cancer Father        lug  . Cancer Maternal Grandfather   . Cancer Paternal  Grandfather   . Colon cancer Neg Hx     Social History   Tobacco Use  . Smoking status: Never Smoker  . Smokeless tobacco: Never Used  Vaping Use  . Vaping Use: Never used  Substance Use Topics  . Alcohol use: No  . Drug use: No    Home Medications Prior to Admission medications   Medication Sig Start Date End Date Taking? Authorizing Provider  azithromycin (ZITHROMAX) 250 MG tablet Take 1 tablet (250 mg total) by mouth daily. Take first 2 tablets together, then 1 every day until finished. 09/20/20  Yes Caree Wolpert, DO  benzonatate (TESSALON) 100 MG capsule Take 1 capsule (100 mg total) by mouth 2 (two) times daily as needed for cough. 08/17/20  Yes Daryll Drown, NP  cholecalciferol (VITAMIN D) 1000 UNITS tablet Take 1,000 Units by mouth daily.   Yes [provider]  dextromethorphan-guaiFENesin (MUCINEX DM) 30-600 MG 12hr tablet Take 1 tablet by mouth 2 (two) times daily. 08/17/20  Yes Daryll Drown, NP  fenofibrate (TRICOR) 145 MG tablet Take 1 tablet (145 mg total) by mouth daily. 06/08/20  Yes Stacks, Broadus John, MD  fluticasone (FLONASE) 50 MCG/ACT nasal spray Place 2 sprays into both nostrils daily. 08/17/20  Yes Daryll Drown, NP  lisinopril (ZESTRIL) 10 MG tablet Take 1 tablet (10 mg total) by mouth daily. 05/08/20  Yes Mechele Claude, MD  lithium 300  MG tablet Take 2 tablets (600 mg total) by mouth daily. 06/08/20  Yes Stacks, Broadus John, MD  methocarbamol (ROBAXIN) 500 MG tablet TAKE 1 TABLET BY MOUTH TWICE A DAY AS NEEDED FOR SPASMS (DAYTIME) 02/26/20  Yes Dettinger, Elige Radon, MD  pantoprazole (PROTONIX) 40 MG tablet Take 40 mg by mouth daily.   Yes [provider]  QUEtiapine (SEROQUEL) 50 MG tablet TAKE 1 TABLET BY MOUTH EVERYDAY AT BEDTIME 09/01/20  Yes Stacks, Broadus John, MD  cetirizine (ZYRTEC ALLERGY) 10 MG tablet Take 1 tablet (10 mg total) by mouth daily. 08/17/20   Daryll Drown, NP    Allergies    Lipitor [atorvastatin] and Niaspan [niacin er]  Review  of Systems   Review of Systems  Constitutional: Negative for chills, diaphoresis, fever and weight loss.  HENT: Positive for congestion. Negative for ear pain, rhinorrhea and sore throat.   Eyes: Negative for pain, discharge and visual disturbance.  Respiratory: Positive for cough. Negative for shortness of breath and wheezing.   Cardiovascular: Negative for chest pain and palpitations.  Gastrointestinal: Negative for abdominal pain and vomiting.  Genitourinary: Negative for dysuria and hematuria.  Musculoskeletal: Negative for arthralgias, back pain and myalgias.  Skin: Negative for color change and rash.  Neurological: Negative for seizures, syncope and headaches.  All other systems reviewed and are negative.   Physical Exam Updated Vital Signs  ED Triage Vitals  Enc Vitals Group     BP 09/20/20 2224 110/78     Pulse Rate 09/20/20 2224 67     Resp 09/20/20 2224 16     Temp 09/20/20 2224 98.7 F (37.1 C)     Temp Source 09/20/20 2224 Oral     SpO2 09/20/20 2224 97 %     Weight 09/20/20 2227 220 lb (99.8 kg)     Height 09/20/20 2227 5\' 11"  (1.803 m)     Head Circumference --      Peak Flow --      Pain Score 09/20/20 2227 4     Pain Loc --      Pain Edu? --      Excl. in GC? --     Physical Exam Vitals and nursing note reviewed.  Constitutional:      General: He is not in acute distress.    Appearance: He is well-developed. He is not ill-appearing.  HENT:     Head: Normocephalic and atraumatic.     Nose: Nose normal.     Mouth/Throat:     Mouth: Mucous membranes are moist.  Eyes:     Extraocular Movements: Extraocular movements intact.     Conjunctiva/sclera: Conjunctivae normal.  Cardiovascular:     Rate and Rhythm: Normal rate and regular rhythm.     Pulses: Normal pulses.     Heart sounds: No murmur heard.   Pulmonary:     Effort: Pulmonary effort is normal. No respiratory distress.     Breath sounds: Normal breath sounds.     Comments: Coarse breathe  sounds throughout  Abdominal:     Palpations: Abdomen is soft.     Tenderness: There is no abdominal tenderness.  Musculoskeletal:     Cervical back: Normal range of motion and neck supple.  Skin:    General: Skin is warm and dry.     Capillary Refill: Capillary refill takes less than 2 seconds.  Neurological:     General: No focal deficit present.     Mental Status: He is alert and oriented to  person, place, and time.     ED Results / Procedures / Treatments   Labs (all labs ordered are listed, but only abnormal results are displayed) Labs Reviewed  RESP PANEL BY RT-PCR (FLU A&B, COVID) [id number]    EKG None  Radiology DG Chest 2 View  Result Date: 09/20/2020 CLINICAL DATA:  Cough for 1 month. EXAM: CHEST - 2 VIEW COMPARISON:  Chest x-ray dated 08/21/2017 FINDINGS: Heart size and mediastinal contours are within normal limits. Lungs are clear. No pleural effusion or pneumothorax is seen. Osseous structures about the chest are unremarkable. IMPRESSION: No active cardiopulmonary disease. Electronically Signed   By: Bary Richard M.D.   On: 09/20/2020 22:52    Procedures Procedures   Medications Ordered in ED Medications  dexamethasone (DECADRON) tablet 10 mg (has no administration in time range)    ED Course  I have reviewed the triage vital signs and the nursing notes.  Pertinent labs & imaging results that were available during my care of the patient were reviewed by me and considered in my medical decision making (see chart for details).    MDM Rules/Calculators/A&P                          [name] is here with cough.  On and off for the last several weeks.  Has had a negative COVID test about 3 weeks ago.  Felt better and then got worse last several days.  Overall has coarse breath sounds on exam.  Does have some nasal congestion.  Chest x-ray showed no pneumonia.  Suspect allergies versus viral process/acute bronchitis.  We will give him a dose of Decadron  and prescribed a Z-Pak.  No pulmonary nodules and no concern for lung mass on chest x-ray.  He is not a smoker.  No concern for COPD exacerbation.  No chest pain or shortness of breath and overall doubt ACS or PE.  We will have him follow-up with primary care doctor.  We will do a new swab for COVID and influenza.  Given reassurance and discharged in the ED in good condition.  This chart was dictated using voice recognition software.  Despite best efforts to proofread,  errors can occur which can change the documentation meaning.    Final Clinical Impression(s) / ED Diagnoses Final diagnoses:  Acute bronchitis, unspecified organism    Rx / DC Orders ED Discharge Orders         Ordered    azithromycin (ZITHROMAX) 250 MG tablet  Daily        09/20/20 2310           Virgina Norfolk, DO 09/20/20 2312

## 2020-09-20 NOTE — Discharge Instructions (Addendum)
Overall suspect you may have allergies versus some type of viral bronchitis.  You can start antibiotics tomorrow after picking up from pharmacy.  You have been given a dose of steroid which I think will be the mainstay of your treatment.  This is a long-acting steroid.  Follow-up your COVID test and flu test on your MyChart.  Follow-up with your primary care doctor if symptoms are persistent.

## 2020-09-20 NOTE — ED Triage Notes (Signed)
Cough x 1 month- productive- states got better and is now worse

## 2020-09-21 LAB — RESP PANEL BY RT-PCR (FLU A&B, COVID) ARPGX2
Influenza A by PCR: NEGATIVE
Influenza B by PCR: NEGATIVE
SARS Coronavirus 2 by RT PCR: NEGATIVE

## 2020-09-28 ENCOUNTER — Other Ambulatory Visit: Payer: Self-pay

## 2020-09-28 ENCOUNTER — Encounter: Payer: Self-pay | Admitting: Adult Health

## 2020-09-28 ENCOUNTER — Ambulatory Visit (INDEPENDENT_AMBULATORY_CARE_PROVIDER_SITE_OTHER): Payer: BC Managed Care – PPO | Admitting: Adult Health

## 2020-09-28 VITALS — BP 109/78 | HR 68 | Ht 71.0 in | Wt 223.0 lb

## 2020-09-28 DIAGNOSIS — F319 Bipolar disorder, unspecified: Secondary | ICD-10-CM

## 2020-09-28 DIAGNOSIS — G4700 Insomnia, unspecified: Secondary | ICD-10-CM

## 2020-09-28 DIAGNOSIS — Z79899 Other long term (current) drug therapy: Secondary | ICD-10-CM | POA: Diagnosis not present
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
## 2020-10-08 ENCOUNTER — Other Ambulatory Visit: Payer: Self-pay

## 2020-10-08 ENCOUNTER — Other Ambulatory Visit: Payer: BC Managed Care – PPO

## 2020-10-08 DIAGNOSIS — Z79899 Other long term (current) drug therapy: Secondary | ICD-10-CM | POA: Diagnosis not present

## 2020-10-20 ENCOUNTER — Encounter: Payer: Self-pay | Admitting: Physician Assistant

## 2020-10-20 ENCOUNTER — Ambulatory Visit: Payer: BC Managed Care – PPO | Admitting: Physician Assistant

## 2020-10-20 ENCOUNTER — Other Ambulatory Visit: Payer: Self-pay

## 2020-10-20 VITALS — BP 111/68 | HR 58 | Temp 97.8°F | Ht 71.0 in | Wt 223.6 lb

## 2020-10-20 DIAGNOSIS — M545 Low back pain, unspecified: Secondary | ICD-10-CM

## 2020-10-20 MED ORDER — MELOXICAM 15 MG PO TABS: 15.0000 mg | ORAL_TABLET | Freq: Every day | ORAL | 0 refills | Status: DC

## 2020-10-20 MED ORDER — CYCLOBENZAPRINE HCL 10 MG PO TABS: 10.0000 mg | ORAL_TABLET | Freq: Three times a day (TID) | ORAL | 0 refills | Status: DC | PRN

## 2020-10-20 NOTE — Progress Notes (Signed)
  Subjective:     Patient ID: BENNIE CHIRICO, male   DOB: 10/15/1960, 60 y.o.   MRN: [id number]  HPI  Pt with lower back pain for several days after help move son Denies any radiation of sx to the lower ext No change in bowel/bladder function + hx of same Review of Systems  Constitutional: Negative.   Musculoskeletal:  Positive for back pain and myalgias.      Objective:   Physical Exam Vitals and nursing note reviewed.  Constitutional:      General: He is in acute distress.     Appearance: Normal appearance.  Neurological:     Mental Status: He is alert.  Nl gait + TTP of the L -spine with R>L No palp spasm FROM of the l-spine SLR neg Muscle strength equal in lower ext Sensory intact DTR 1+/= bilat lower ext     Assessment:     1. Acute midline low back pain without sciatica          Plan:     Heat/Ice Massage Gentle stretching Mobic rx Flexeril rx F/u prn

## 2020-10-20 NOTE — Patient Instructions (Signed)
Low Back Sprain or Strain Rehab Ask your health care provider which exercises are safe for you. Do exercises exactly as told by your health care provider and adjust them as directed. It is normal to feel mild stretching, pulling, tightness, or discomfort as you do these exercises. Stop right away if you feel sudden pain or your pain gets worse. Do not begin these exercises until told by your health care provider. Stretching and range-of-motion exercises These exercises warm up your muscles and joints and improve the movement and flexibility of your back. These exercises also help to relieve pain, numbness,and tingling. Lumbar rotation  Lie on your back on a firm surface and bend your knees. Straighten your arms out to your sides so each arm forms a 90-degree angle (right angle) with a side of your body. Slowly move (rotate) both of your knees to one side of your body until you feel a stretch in your lower back (lumbar). Try not to let your shoulders lift off the floor. Hold this position for __________ seconds. Tense your abdominal muscles and slowly move your knees back to the starting position. Repeat this exercise on the other side of your body. Repeat __________ times. Complete this exercise __________ times a day. Single knee to chest  Lie on your back on a firm surface with both legs straight. Bend one of your knees. Use your hands to move your knee up toward your chest until you feel a gentle stretch in your lower back and buttock. Hold your leg in this position by holding on to the front of your knee. Keep your other leg as straight as possible. Hold this position for __________ seconds. Slowly return to the starting position. Repeat with your other leg. Repeat __________ times. Complete this exercise __________ times a day. Prone extension on elbows  Lie on your abdomen on a firm surface (prone position). Prop yourself up on your elbows. Use your arms to help lift your chest up  until you feel a gentle stretch in your abdomen and your lower back. This will place some of your body weight on your elbows. If this is uncomfortable, try stacking pillows under your chest. Your hips should stay down, against the surface that you are lying on. Keep your hip and back muscles relaxed. Hold this position for __________ seconds. Slowly relax your upper body and return to the starting position. Repeat __________ times. Complete this exercise __________ times a day. Strengthening exercises These exercises build strength and endurance in your back. Endurance is theability to use your muscles for a long time, even after they get tired. Pelvic tilt This exercise strengthens the muscles that lie deep in the abdomen. Lie on your back on a firm surface. Bend your knees and keep your feet flat on the floor. Tense your abdominal muscles. Tip your pelvis up toward the ceiling and flatten your lower back into the floor. To help with this exercise, you may place a small towel under your lower back and try to push your back into the towel. Hold this position for __________ seconds. Let your muscles relax completely before you repeat this exercise. Repeat __________ times. Complete this exercise __________ times a day. Alternating arm and leg raises  Get on your hands and knees on a firm surface. If you are on a hard floor, you may want to use padding, such as an exercise mat, to cushion your knees. Line up your arms and legs. Your hands should be directly below your shoulders,   and your knees should be directly below your hips. Lift your left leg behind you. At the same time, raise your right arm and straighten it in front of you. Do not lift your leg higher than your hip. Do not lift your arm higher than your shoulder. Keep your abdominal and back muscles tight. Keep your hips facing the ground. Do not arch your back. Keep your balance carefully, and do not hold your breath. Hold this  position for __________ seconds. Slowly return to the starting position. Repeat with your right leg and your left arm. Repeat __________ times. Complete this exercise __________ times a day. Abdominal set with straight leg raise  Lie on your back on a firm surface. Bend one of your knees and keep your other leg straight. Tense your abdominal muscles and lift your straight leg up, 4-6 inches (10-15 cm) off the ground. Keep your abdominal muscles tight and hold this position for __________ seconds. Do not hold your breath. Do not arch your back. Keep it flat against the ground. Keep your abdominal muscles tense as you slowly lower your leg back to the starting position. Repeat with your other leg. Repeat __________ times. Complete this exercise __________ times a day. Single leg lower with bent knees Lie on your back on a firm surface. Tense your abdominal muscles and lift your feet off the floor, one foot at a time, so your knees and hips are bent in 90-degree angles (right angles). Your knees should be over your hips and your lower legs should be parallel to the floor. Keeping your abdominal muscles tense and your knee bent, slowly lower one of your legs so your toe touches the ground. Lift your leg back up to return to the starting position. Do not hold your breath. Do not let your back arch. Keep your back flat against the ground. Repeat with your other leg. Repeat __________ times. Complete this exercise __________ times a day. Posture and body mechanics Good posture and healthy body mechanics can help to relieve stress in your body's tissues and joints. Body mechanics refers to the movements and positions of your body while you do your daily activities. Posture is part of body mechanics. Good posture means: Your spine is in its natural S-curve position (neutral). Your shoulders are pulled back slightly. Your head is not tipped forward. Follow these guidelines to improve your posture  and body mechanics in youreveryday activities. Standing  When standing, keep your spine neutral and your feet about hip width apart. Keep a slight bend in your knees. Your ears, shoulders, and hips should line up. When you do a task in which you stand in one place for a long time, place one foot up on a stable object that is 2-4 inches (5-10 cm) high, such as a footstool. This helps keep your spine neutral.  Sitting  When sitting, keep your spine neutral and keep your feet flat on the floor. Use a footrest, if necessary, and keep your thighs parallel to the floor. Avoid rounding your shoulders, and avoid tilting your head forward. When working at a desk or a computer, keep your desk at a height where your hands are slightly lower than your elbows. Slide your chair under your desk so you are close enough to maintain good posture. When working at a computer, place your monitor at a height where you are looking straight ahead and you do not have to tilt your head forward or downward to look at the screen.    Resting When lying down and resting, avoid positions that are most painful for you. If you have pain with activities such as sitting, bending, stooping, or squatting, lie in a position in which your body does not bend very much. For example, avoid curling up on your side with your arms and knees near your chest (fetal position). If you have pain with activities such as standing for a long time or reaching with your arms, lie with your spine in a neutral position and bend your knees slightly. Try the following positions: Lying on your side with a pillow between your knees. Lying on your back with a pillow under your knees. Lifting  When lifting objects, keep your feet at least shoulder width apart and tighten your abdominal muscles. Bend your knees and hips and keep your spine neutral. It is important to lift using the strength of your legs, not your back. Do not lock your knees straight  out. Always ask for help to lift heavy or awkward objects.  This information is not intended to replace advice given to you by your health care provider. Make sure you discuss any questions you have with your healthcare provider. Document Revised: 07/27/2018 Document Reviewed: 04/26/2018 Elsevier Patient Education  2022 [employer] Inc.  

## 2020-10-26 DIAGNOSIS — F439 Reaction to severe stress, unspecified: Secondary | ICD-10-CM | POA: Diagnosis not present

## 2020-10-28 ENCOUNTER — Ambulatory Visit: Payer: BC Managed Care – PPO | Admitting: Adult Health

## 2020-10-28 ENCOUNTER — Encounter: Payer: Self-pay | Admitting: Adult Health

## 2020-10-28 ENCOUNTER — Other Ambulatory Visit: Payer: Self-pay | Admitting: Family Medicine

## 2020-10-28 ENCOUNTER — Other Ambulatory Visit: Payer: Self-pay

## 2020-10-28 DIAGNOSIS — G4700 Insomnia, unspecified: Secondary | ICD-10-CM | POA: Diagnosis not present

## 2020-10-28 DIAGNOSIS — F319 Bipolar disorder, unspecified: Secondary | ICD-10-CM

## 2020-10-28 DIAGNOSIS — I10 Essential (primary) hypertension: Secondary | ICD-10-CM

## 2020-10-28 MED ORDER — QUETIAPINE FUMARATE 50 MG PO TABS: ORAL_TABLET | ORAL | 1 refills | Status: DC

## 2020-10-28 MED ORDER — LITHIUM CARBONATE 300 MG PO TABS: 600.0000 mg | ORAL_TABLET | Freq: Every day | ORAL | 1 refills | Status: DC
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
## 2020-11-09 DIAGNOSIS — M25561 Pain in right knee: Secondary | ICD-10-CM | POA: Diagnosis not present

## 2020-11-16 DIAGNOSIS — M25561 Pain in right knee: Secondary | ICD-10-CM | POA: Diagnosis not present

## 2020-11-30 ENCOUNTER — Ambulatory Visit: Payer: BC Managed Care – PPO | Admitting: Family Medicine

## 2020-12-16 ENCOUNTER — Other Ambulatory Visit: Payer: Self-pay

## 2020-12-16 ENCOUNTER — Encounter: Payer: Self-pay | Admitting: Family Medicine

## 2020-12-16 ENCOUNTER — Ambulatory Visit: Payer: BC Managed Care – PPO | Admitting: Family Medicine

## 2020-12-16 VITALS — BP 111/70 | HR 64 | Temp 98.1°F | Resp 20 | Ht 71.0 in | Wt 234.0 lb

## 2020-12-16 DIAGNOSIS — I10 Essential (primary) hypertension: Secondary | ICD-10-CM

## 2020-12-16 DIAGNOSIS — E782 Mixed hyperlipidemia: Secondary | ICD-10-CM | POA: Diagnosis not present

## 2020-12-16 DIAGNOSIS — F3171 Bipolar disorder, in partial remission, most recent episode hypomanic: Secondary | ICD-10-CM

## 2020-12-16 DIAGNOSIS — E559 Vitamin D deficiency, unspecified: Secondary | ICD-10-CM

## 2020-12-16 MED ORDER — LISINOPRIL 10 MG PO TABS: 10.0000 mg | ORAL_TABLET | Freq: Every day | ORAL | 1 refills | Status: DC

## 2020-12-16 MED ORDER — FENOFIBRATE 145 MG PO TABS: 145.0000 mg | ORAL_TABLET | Freq: Every day | ORAL | 1 refills | Status: DC

## 2020-12-16 NOTE — Progress Notes (Signed)
 Subjective:  Patient ID: Melvin Roberts, male    DOB: 12/19/1960  Age: 59 y.o. MRN: [id number]  CC: Medical Management of Chronic Issues   HPI Melvin Roberts presents for  follow-up of hypertension. Patient has no history of headache chest pain or shortness of breath or recent cough. Patient also denies symptoms of TIA such as focal numbness or weakness. Patient denies side effects from medication. States taking it regularly.   in for follow-up of elevated cholesterol. Doing well without complaints on current medication. Denies side effects including myalgia and arthralgia and nausea. Currently no chest pain, shortness of breath or other cardiovascular related symptoms noted.   Bipolar well controlled and feeling much better with recent decrease of medication.   History Melvin Roberts has a past medical history of Bipolar 1 disorder (HCC), Hyperlipidemia, and Hypertension.   Melvin Roberts has a past surgical history that includes Back surgery (11/17/95); Hernia repair (2009); Eye surgery; and Upper gastrointestinal endoscopy (x2).   His family history includes Cancer in his father, maternal grandfather, mother, and paternal grandfather.Melvin Roberts reports that Melvin Roberts has never smoked. Melvin Roberts has never used smokeless tobacco. Melvin Roberts reports that Melvin Roberts does not drink alcohol and does not use drugs.  Current Outpatient Medications on File Prior to Visit  Medication Sig Dispense Refill   cholecalciferol (VITAMIN D) 1000 UNITS tablet Take 1,000 Units by mouth daily.     fluticasone (FLONASE) 50 MCG/ACT nasal spray Place 2 sprays into both nostrils daily. 16 g 6   lithium 300 MG tablet Take 2 tablets (600 mg total) by mouth daily. 180 tablet 1   meloxicam (MOBIC) 15 MG tablet Take 1 tablet (15 mg total) by mouth daily. 14 tablet 0   pantoprazole (PROTONIX) 40 MG tablet Take 40 mg by mouth daily.     QUEtiapine (SEROQUEL) 50 MG tablet TAKE 1 TABLET BY MOUTH EVERYDAY AT BEDTIME 90 tablet 1   cyclobenzaprine (FLEXERIL) 10 MG  tablet Take 1 tablet (10 mg total) by mouth 3 (three) times daily as needed for muscle spasms. (Patient not taking: Reported on 12/16/2020) 21 tablet 0   methocarbamol (ROBAXIN) 500 MG tablet TAKE 1 TABLET BY MOUTH TWICE A DAY AS NEEDED FOR SPASMS (DAYTIME) (Patient not taking: Reported on 12/16/2020) 60 tablet 3   No current facility-administered medications on file prior to visit.    ROS Review of Systems  Constitutional:  Negative for fever.  Respiratory:  Negative for shortness of breath.   Cardiovascular:  Negative for chest pain.  Musculoskeletal:  Negative for arthralgias.  Skin:  Negative for rash.   Objective:  BP 111/70   Pulse 64   Temp 98.1 F (36.7 C) (Temporal)   Resp 20   Ht 5' 11" (1.803 m)   Wt 234 lb (106.1 kg)   SpO2 95%   BMI 32.64 kg/m   BP Readings from Last 3 Encounters:  12/16/20 111/70  10/20/20 111/68  09/20/20 111/83    Wt Readings from Last 3 Encounters:  12/16/20 234 lb (106.1 kg)  10/20/20 223 lb 9.6 oz (101.4 kg)  09/20/20 220 lb (99.8 kg)     Physical Exam Vitals reviewed.  Constitutional:      Appearance: Melvin Roberts is well-developed.  HENT:     Head: Normocephalic and atraumatic.     Right Ear: External ear normal.     Left Ear: External ear normal.     Mouth/Throat:     Pharynx: No oropharyngeal exudate or posterior oropharyngeal erythema.  Eyes:       Pupils: Pupils are equal, round, and reactive to light.  Cardiovascular:     Rate and Rhythm: Normal rate and regular rhythm.     Heart sounds: No murmur heard. Pulmonary:     Effort: No respiratory distress.     Breath sounds: Normal breath sounds.  Musculoskeletal:     Cervical back: Normal range of motion and neck supple.  Neurological:     Mental Status: Melvin Roberts is alert and oriented to person, place, and time.      Assessment & Plan:   Melvin Roberts was seen today for medical management of chronic issues.  Diagnoses and all orders for this visit:  Vitamin D deficiency -      CMP14+EGFR  Essential hypertension -     lisinopril (ZESTRIL) 10 MG tablet; Take 1 tablet (10 mg total) by mouth daily. -     CMP14+EGFR  Mixed hyperlipidemia -     CMP14+EGFR -     Lipid panel  Bipolar disorder, in partial remission, most recent episode hypomanic (HCC) -     CMP14+EGFR  Other orders -     fenofibrate (TRICOR) 145 MG tablet; Take 1 tablet (145 mg total) by mouth daily.  Allergies as of 12/16/2020       Reactions   Lipitor [atorvastatin]    Niaspan [niacin Er]         Medication List        Accurate as of December 16, 2020  4:24 PM. If you have any questions, ask your nurse or doctor.          cholecalciferol 1000 units tablet Commonly known as: VITAMIN D Take 1,000 Units by mouth daily.   cyclobenzaprine 10 MG tablet Commonly known as: FLEXERIL Take 1 tablet (10 mg total) by mouth 3 (three) times daily as needed for muscle spasms.   fenofibrate 145 MG tablet Commonly known as: TRICOR Take 1 tablet (145 mg total) by mouth daily.   fluticasone 50 MCG/ACT nasal spray Commonly known as: FLONASE Place 2 sprays into both nostrils daily.   lisinopril 10 MG tablet Commonly known as: ZESTRIL Take 1 tablet (10 mg total) by mouth daily.   lithium 300 MG tablet Take 2 tablets (600 mg total) by mouth daily.   meloxicam 15 MG tablet Commonly known as: MOBIC Take 1 tablet (15 mg total) by mouth daily.   methocarbamol 500 MG tablet Commonly known as: ROBAXIN TAKE 1 TABLET BY MOUTH TWICE A DAY AS NEEDED FOR SPASMS (DAYTIME)   pantoprazole 40 MG tablet Commonly known as: PROTONIX Take 40 mg by mouth daily.   QUEtiapine 50 MG tablet Commonly known as: SEROQUEL TAKE 1 TABLET BY MOUTH EVERYDAY AT BEDTIME        Meds ordered this encounter  Medications   lisinopril (ZESTRIL) 10 MG tablet    Sig: Take 1 tablet (10 mg total) by mouth daily.    Dispense:  90 tablet    Refill:  1   fenofibrate (TRICOR) 145 MG tablet    Sig: Take 1 tablet (145  mg total) by mouth daily.    Dispense:  90 tablet    Refill:  1      Follow-up: Return in about 6 months (around 06/15/2021) for [facility] physical.   , M.D.  

## 2020-12-17 DIAGNOSIS — F439 Reaction to severe stress, unspecified: Secondary | ICD-10-CM | POA: Diagnosis not present

## 2020-12-17 LAB — CMP14+EGFR
ALT: 37 IU/L (ref 0–44)
AST: 39 IU/L (ref 0–40)
Albumin/Globulin Ratio: 1.9 (ref 1.2–2.2)
Albumin: 4.4 g/dL (ref 3.8–4.9)
Alkaline Phosphatase: 44 IU/L (ref 44–121)
BUN/Creatinine Ratio: 13 (ref 9–20)
BUN: 22 mg/dL (ref 6–24)
Bilirubin Total: <0.2 mg/dL (ref 0.0–1.2)
CO2: 20 mmol/L (ref 20–29)
Calcium: 10.1 mg/dL (ref 8.7–10.2)
Chloride: 111 mmol/L — ABNORMAL HIGH (ref 96–106)
Creatinine, Ser: 1.67 mg/dL — ABNORMAL HIGH (ref 0.76–1.27)
Globulin, Total: 2.3 g/dL (ref 1.5–4.5)
Glucose: 87 mg/dL (ref 65–99)
Potassium: 5 mmol/L (ref 3.5–5.2)
Sodium: 141 mmol/L (ref 134–144)
Total Protein: 6.7 g/dL (ref 6.0–8.5)
eGFR: 47 mL/min/{1.73_m2} — ABNORMAL LOW (ref >59)

## 2020-12-17 LAB — LITHIUM LEVEL: Lithium Lvl: 0.8 mmol/L (ref 0.5–1.2)

## 2020-12-17 LAB — LIPID PANEL
Chol/HDL Ratio: 5.3 ratio — ABNORMAL HIGH (ref 0.0–5.0)
Cholesterol, Total: 181 mg/dL (ref 100–199)
HDL: 34 mg/dL — ABNORMAL LOW (ref >39)
LDL Chol Calc (NIH): 109 mg/dL — ABNORMAL HIGH (ref 0–99)
Triglycerides: 219 mg/dL — ABNORMAL HIGH (ref 0–149)
VLDL Cholesterol Cal: 38 mg/dL (ref 5–40)

## 2020-12-21 NOTE — Progress Notes (Signed)
Hello Melvin Roberts,  Your lab result is normal and/or stable.Some minor variations that are not significant are commonly marked abnormal, but do not represent any medical problem for you.  Best regards, Ai Sonnenfeld, M.D.

## 2020-12-29 ENCOUNTER — Ambulatory Visit: Payer: BC Managed Care – PPO | Admitting: Adult Health

## 2021-01-02 ENCOUNTER — Telehealth: Payer: BC Managed Care – PPO | Admitting: Physician Assistant

## 2021-01-02 DIAGNOSIS — U071 COVID-19: Secondary | ICD-10-CM | POA: Diagnosis not present

## 2021-01-02 MED ORDER — MOLNUPIRAVIR EUA 200MG CAPSULE: 4.0000 | ORAL_CAPSULE | Freq: Two times a day (BID) | ORAL | 0 refills | Status: AC

## 2021-01-02 MED ORDER — BENZONATATE 100 MG PO CAPS
100.0000 mg | ORAL_CAPSULE | Freq: Three times a day (TID) | ORAL | 0 refills | Status: DC | PRN
Start: 2021-01-02 — End: 2021-07-28

## 2021-01-02 NOTE — Progress Notes (Signed)
Virtual Visit Consent   FAOLAN Roberts, you are scheduled for a virtual visit with a [facility] provider today.     Just as with appointments in the office, your consent must be obtained to participate.  Your consent will be active for this visit and any virtual visit you may have with one of our providers in the next 365 days.     If you have a MyChart account, a copy of this consent can be sent to you electronically.  All virtual visits are billed to your insurance company just like a traditional visit in the office.    As this is a virtual visit, video technology does not allow for your provider to perform a traditional examination.  This may limit your provider's ability to fully assess your condition.  If your provider identifies any concerns that need to be evaluated in person or the need to arrange testing (such as labs, EKG, etc.), we will make arrangements to do so.     Although advances in technology are sophisticated, we cannot ensure that it will always work on either your end or our end.  If the connection with a video visit is poor, the visit may have to be switched to a telephone visit.  With either a video or telephone visit, we are not always able to ensure that we have a secure connection.     I need to obtain your verbal consent now.   Are you willing to proceed with your visit today?    AXTEN PASCUCCI has provided verbal consent on 01/02/2021 for a virtual visit (video or telephone).   Melvin Roberts, [place]   Date: 01/02/2021 9:14 AM   Virtual Visit via Video Note   I, Melvin Roberts, connected with  Melvin Roberts  ([id number], 10/05/1960) on 01/02/21 at  9:00 AM EDT by a video-enabled telemedicine application and verified that I am speaking with the correct person using two identifiers.  Location: Patient: Virtual Visit Location Patient: Home Provider: Virtual Visit Location Provider: Home Office   I discussed the limitations of evaluation  and management by telemedicine and the availability of in person appointments. The patient expressed understanding and agreed to proceed.    History of Present Illness: Melvin Roberts is a 60 y.o. who identifies as a male who was assigned male at birth, and is being seen today for COVID-19. Notes testing positive in the last day. Main symptoms are headaches, nasal/head congestion, body aches and fatigue starting yesterday. Denies chest pain or SOB. Wife also COVID +. Denies GI symptoms, chest pain or SOB. Has taken Mucinex, Ibuprofen and tylenol to help with the symptoms. Has had his initial COVID vaccines and a booster thus far.   HPI: HPI  Problems:  Patient Active Problem List   Diagnosis Date Noted   Upper respiratory tract infection 08/17/2020   Vitamin D deficiency 04/15/2019   BMI 32.0-32.9,adult 04/15/2019   Hypertension 09/16/2013   Bipolar disorder (HCC) 03/11/2013   Allergic rhinitis 03/11/2013   BPH (benign prostatic hyperplasia) 03/11/2013   Hyperlipidemia 05/29/2007   Depression, recurrent (HCC) 05/29/2007   Sinusitis, chronic 05/29/2007   ESOPHAGEAL STRICTURE 05/29/2007   GERD 05/29/2007   HIATAL HERNIA 05/29/2007   LOW BACK PAIN, CHRONIC 05/29/2007    Allergies:  Allergies  Allergen Reactions   Lipitor [Atorvastatin]    Niaspan [Niacin Er]    Medications:  Current Outpatient Medications:    benzonatate (TESSALON) 100 MG capsule, Take 1 capsule (  100 mg total) by mouth 3 (three) times daily as needed for cough., Disp: 30 capsule, Rfl: 0   molnupiravir EUA (LAGEVRIO) 200 mg CAPS capsule, Take 4 capsules (800 mg total) by mouth 2 (two) times daily for 5 days., Disp: 40 capsule, Rfl: 0   cholecalciferol (VITAMIN D) 1000 UNITS tablet, Take 1,000 Units by mouth daily., Disp: , Rfl:    fenofibrate (TRICOR) 145 MG tablet, Take 1 tablet (145 mg total) by mouth daily., Disp: 90 tablet, Rfl: 1   fluticasone (FLONASE) 50 MCG/ACT nasal spray, Place 2 sprays into both  nostrils daily., Disp: 16 g, Rfl: 6   lisinopril (ZESTRIL) 10 MG tablet, Take 1 tablet (10 mg total) by mouth daily., Disp: 90 tablet, Rfl: 1   lithium 300 MG tablet, Take 2 tablets (600 mg total) by mouth daily., Disp: 180 tablet, Rfl: 1   pantoprazole (PROTONIX) 40 MG tablet, Take 40 mg by mouth daily., Disp: , Rfl:    QUEtiapine (SEROQUEL) 50 MG tablet, TAKE 1 TABLET BY MOUTH EVERYDAY AT BEDTIME, Disp: 90 tablet, Rfl: 1  Observations/Objective: Patient is well-developed, well-nourished in no acute distress.  Resting comfortably at home.  Head is normocephalic, atraumatic.  No labored breathing. Speech is clear and coherent with logical content.  Patient is alert and oriented at baseline.   Assessment and Plan: 1. COVID-19 - benzonatate (TESSALON) 100 MG capsule; Take 1 capsule (100 mg total) by mouth 3 (three) times daily as needed for cough.  Dispense: 30 capsule; Refill: 0 - molnupiravir EUA (LAGEVRIO) 200 mg CAPS capsule; Take 4 capsules (800 mg total) by mouth 2 (two) times daily for 5 days.  Dispense: 40 capsule; Refill: 0 - MyChart COVID-19 home monitoring program; Future Patient with multiple risk factors for complicated course of illness. Discussed risks/benefits of antiviral medications including most common potential ADRs. Patient voiced understanding and would like to proceed with antiviral medication. They are candidate for molnupiravir. Rx sent to pharmacy. Supportive measures, OTC medications and vitamin regimen reviewed. Tessalon per order. Patient has been enrolled in a MyChart COVID symptom monitoring program. Melvin Roberts reviewed in detail. Strict ER precautions discussed with patient.    Follow Up Instructions: I discussed the assessment and treatment plan with the patient. The patient was provided an opportunity to ask questions and all were answered. The patient agreed with the plan and demonstrated an understanding of the instructions.  A copy of instructions were sent  to the patient via MyChart.  The patient was advised to call back or seek an in-person evaluation if the symptoms worsen or if the condition fails to improve as anticipated.  Time:  I spent 15 minutes with the patient via telehealth technology discussing the above problems/concerns.    Melvin Climes, PA-C

## 2021-01-02 NOTE — Patient Instructions (Signed)
Melvin Roberts, thank you for joining Piedad Climes, PA-C for today's virtual visit.  While this provider is not your primary care provider (PCP), if your PCP is located in our provider database this encounter information will be shared with them immediately following your visit.  Consent: (Patient) Melvin Roberts provided verbal consent for this virtual visit at the beginning of the encounter.  Current Medications:  Current Outpatient Medications:    cholecalciferol (VITAMIN D) 1000 UNITS tablet, Take 1,000 Units by mouth daily., Disp: , Rfl:    cyclobenzaprine (FLEXERIL) 10 MG tablet, Take 1 tablet (10 mg total) by mouth 3 (three) times daily as needed for muscle spasms. (Patient not taking: Reported on 12/16/2020), Disp: 21 tablet, Rfl: 0   fenofibrate (TRICOR) 145 MG tablet, Take 1 tablet (145 mg total) by mouth daily., Disp: 90 tablet, Rfl: 1   fluticasone (FLONASE) 50 MCG/ACT nasal spray, Place 2 sprays into both nostrils daily., Disp: 16 g, Rfl: 6   lisinopril (ZESTRIL) 10 MG tablet, Take 1 tablet (10 mg total) by mouth daily., Disp: 90 tablet, Rfl: 1   lithium 300 MG tablet, Take 2 tablets (600 mg total) by mouth daily., Disp: 180 tablet, Rfl: 1   meloxicam (MOBIC) 15 MG tablet, Take 1 tablet (15 mg total) by mouth daily., Disp: 14 tablet, Rfl: 0   methocarbamol (ROBAXIN) 500 MG tablet, TAKE 1 TABLET BY MOUTH TWICE A DAY AS NEEDED FOR SPASMS (DAYTIME) (Patient not taking: Reported on 12/16/2020), Disp: 60 tablet, Rfl: 3   pantoprazole (PROTONIX) 40 MG tablet, Take 40 mg by mouth daily., Disp: , Rfl:    QUEtiapine (SEROQUEL) 50 MG tablet, TAKE 1 TABLET BY MOUTH EVERYDAY AT BEDTIME, Disp: 90 tablet, Rfl: 1   Medications ordered in this encounter:  No orders of the defined types were placed in this encounter.    *If you need refills on other medications prior to your next appointment, please contact your pharmacy*  Follow-Up: Call back or seek an in-person evaluation  if the symptoms worsen or if the condition fails to improve as anticipated.  Other Instructions Please keep well-hydrated and get plenty of rest. Start a saline nasal rinse to flush out your nasal passages. You can use plain Mucinex to help thin congestion. If you have a humidifier, running in the bedroom at night. I want you to start OTC vitamin D3 1000 units daily, vitamin C 1000 mg daily, and a zinc supplement. Please take prescribed medications as directed.  You have been enrolled in a MyChart symptom monitoring program. Please answer these questions daily so we can keep track of how you are doing.  You were to quarantine for 5 days from onset of your symptoms.  After day 5, if you have had no fever and you are feeling better, you can end quarantine but need to mask for an additional 5 days. After day 5 if you have a fever or are having significant symptoms, please quarantine for full 10 days.  If you note any worsening of symptoms, any significant shortness of breath or any chest pain, please seek ER evaluation ASAP.  Please do not delay care!  COVID-19: What to Do if You Are Sick If you test positive and are an older adult or someone who is at high risk of getting very sick from COVID-19, treatment may be available. Contact a healthcare provider right away after a positive test to determine if you are eligible, even if your symptoms are mild right now.  You can also visit a Test to Treat location and, if eligible, receive a prescription from a provider. Don't delay: Treatment must be started within the first few days to be effective. If you have a fever, cough, or other symptoms, you might have COVID-19. Most people have mild illness and are able to recover at home. If you are sick: Keep track of your symptoms. If you have an emergency warning sign (including trouble breathing), call 911. Steps to help prevent the spread of COVID-19 if you are sick If you are sick with COVID-19 or think  you might have COVID-19, follow the steps below to care for yourself and to help protect other people in your home and community. Stay home except to get medical care Stay home. Most people with COVID-19 have mild illness and can recover at home without medical care. Do not leave your home, except to get medical care. Do not visit public areas and do not go to places where you are unable to wear a mask. Take care of yourself. Get rest and stay hydrated. Take over-the-counter medicines, such as acetaminophen, to help you feel better. Stay in touch with your doctor. Call before you get medical care. Be sure to get care if you have trouble breathing, or have any other emergency warning signs, or if you think it is an emergency. Avoid public transportation, ride-sharing, or taxis if possible. Get tested If you have symptoms of COVID-19, get tested. While waiting for test results, stay away from others, including staying apart from those living in your household. Get tested as soon as possible after your symptoms start. Treatments may be available for people with COVID-19 who are at risk for becoming very sick. Don't delay: Treatment must be started early to be effective--some treatments must begin within 5 days of your first symptoms. Contact your healthcare provider right away if your test result is positive to determine if you are eligible. Self-tests are one of several options for testing for the virus that causes COVID-19 and may be more convenient than laboratory-based tests and point-of-care tests. Ask your healthcare provider or your local health department if you need help interpreting your test results. You can visit your state, tribal, local, and territorial health department's website to look for the latest local information on testing sites. Separate yourself from other people As much as possible, stay in a specific room and away from other people and pets in your home. If possible, you should  use a separate bathroom. If you need to be around other people or animals in or outside of the home, wear a well-fitting mask. Tell your close contacts that they may have been exposed to COVID-19. An infected person can spread COVID-19 starting 48 hours (or 2 days) before the person has any symptoms or tests positive. By letting your close contacts know they may have been exposed to COVID-19, you are helping to protect everyone. See COVID-19 and Animals if you have questions about pets. If you are diagnosed with COVID-19, someone from the health department may call you. Answer the call to slow the spread. Monitor your symptoms Symptoms of COVID-19 include fever, cough, or other symptoms. Follow care instructions from your healthcare provider and local health department. Your local health authorities may give instructions on checking your symptoms and reporting information. When to seek emergency medical attention Look for emergency warning signs* for COVID-19. If someone is showing any of these signs, seek emergency medical care immediately: Trouble breathing Persistent pain or  pressure in the chest New confusion Inability to wake or stay awake Pale, gray, or blue-colored skin, lips, or nail beds, depending on skin tone *This list is not all possible symptoms. Please call your medical provider for any other symptoms that are severe or concerning to you. Call 911 or call ahead to your local emergency facility: Notify the operator that you are seeking care for someone who has or may have COVID-19. Call ahead before visiting your doctor Call ahead. Many medical visits for routine care are being postponed or done by phone or telemedicine. If you have a medical appointment that cannot be postponed, call your doctor's office, and tell them you have or may have COVID-19. This will help the office protect themselves and other patients. If you are sick, wear a well-fitting mask You should wear a mask if  you must be around other people or animals, including pets (even at home). Wear a mask with the best fit, protection, and comfort for you. You don't need to wear the mask if you are alone. If you can't put on a mask (because of trouble breathing, for example), cover your coughs and sneezes in some other way. Try to stay at least 6 feet away from other people. This will help protect the people around you. Masks should not be placed on young children under age 70 years, anyone who has trouble breathing, or anyone who is not able to remove the mask without help. Cover your coughs and sneezes Cover your mouth and nose with a tissue when you cough or sneeze. Throw away used tissues in a lined trash can. Immediately wash your hands with soap and water for at least 20 seconds. If soap and water are not available, clean your hands with an alcohol-based hand sanitizer that contains at least 60% alcohol. Clean your hands often Wash your hands often with soap and water for at least 20 seconds. This is especially important after blowing your nose, coughing, or sneezing; going to the bathroom; and before eating or preparing food. Use hand sanitizer if soap and water are not available. Use an alcohol-based hand sanitizer with at least 60% alcohol, covering all surfaces of your hands and rubbing them together until they feel dry. Soap and water are the best option, especially if hands are visibly dirty. Avoid touching your eyes, nose, and mouth with unwashed hands. Handwashing Tips Avoid sharing personal household items Do not share dishes, drinking glasses, cups, eating utensils, towels, or bedding with other people in your home. Wash these items thoroughly after using them with soap and water or put in the dishwasher. Clean surfaces in your home regularly Clean and disinfect high-touch surfaces (for example, doorknobs, tables, handles, light switches, and countertops) in your "sick room" and bathroom. In shared  spaces, you should clean and disinfect surfaces and items after each use by the person who is ill. If you are sick and cannot clean, a caregiver or other person should only clean and disinfect the area around you (such as your bedroom and bathroom) on an as needed basis. Your caregiver/other person should wait as long as possible (at least several hours) and wear a mask before entering, cleaning, and disinfecting shared spaces that you use. Clean and disinfect areas that may have blood, stool, or body fluids on them. Use household cleaners and disinfectants. Clean visible dirty surfaces with household cleaners containing soap or detergent. Then, use a household disinfectant. Use a product from [employer] List N: Disinfectants for Coronavirus (COVID-19).  Be sure to follow the instructions on the label to ensure safe and effective use of the product. Many products recommend keeping the surface wet with a disinfectant for a certain period of time (look at "contact time" on the product label). You may also need to wear personal protective equipment, such as gloves, depending on the directions on the product label. Immediately after disinfecting, wash your hands with soap and water for 20 seconds. For completed guidance on cleaning and disinfecting your home, visit Complete Disinfection Guidance. Take steps to improve ventilation at home Improve ventilation (air flow) at home to help prevent from spreading COVID-19 to other people in your household. Clear out COVID-19 virus particles in the air by opening windows, using air filters, and turning on fans in your home. Use this interactive tool to learn how to improve air flow in your home. When you can be around others after being sick with COVID-19 Deciding when you can be around others is different for different situations. Find out when you can safely end home isolation. For any additional questions about your care, contact your healthcare provider or state or  local health department. 07/07/2020 Content source: [facility] ([facility]), Division of Viral Diseases This information is not intended to replace advice given to you by your health care provider. Make sure you discuss any questions you have with your health care provider. Document Revised: 08/20/2020 Document Reviewed: 08/20/2020 Elsevier Patient Education  2022 [employer].      If you have been instructed to have an in-person evaluation today at a local Urgent Care facility, please use the link below. It will take you to a list of all of our available [facility], including address, phone number and hours of operation. Please do not delay care.  [facility]  If you or a family member do not have a primary care provider, use the link below to schedule a visit and establish care. When you choose a [facility] primary care physician or advanced practice provider, you gain a long-term partner in health. Find a Primary Care Provider  Learn more about [facility] in-office and virtual care options: [facility] - Get Care Now

## 2021-02-04 DIAGNOSIS — F439 Reaction to severe stress, unspecified: Secondary | ICD-10-CM | POA: Diagnosis not present

## 2021-02-10 ENCOUNTER — Other Ambulatory Visit: Payer: Self-pay

## 2021-02-10 ENCOUNTER — Ambulatory Visit: Payer: BC Managed Care – PPO | Admitting: Adult Health

## 2021-02-10 ENCOUNTER — Encounter: Payer: Self-pay | Admitting: Adult Health

## 2021-02-10 ENCOUNTER — Ambulatory Visit: Payer: BC Managed Care – PPO | Admitting: Physician Assistant

## 2021-02-10 DIAGNOSIS — Z79899 Other long term (current) drug therapy: Secondary | ICD-10-CM | POA: Diagnosis not present

## 2021-02-10 DIAGNOSIS — F319 Bipolar disorder, unspecified: Secondary | ICD-10-CM | POA: Diagnosis not present

## 2021-02-10 DIAGNOSIS — G4700 Insomnia, unspecified: Secondary | ICD-10-CM

## 2021-02-10 MED ORDER — QUETIAPINE FUMARATE 50 MG PO TABS: ORAL_TABLET | ORAL | 1 refills | Status: DC

## 2021-02-10 MED ORDER — LITHIUM CARBONATE 300 MG PO TABS: 600.0000 mg | ORAL_TABLET | Freq: Every day | ORAL | 1 refills | Status: DC

## 2021-02-10 NOTE — Progress Notes (Signed)
Melvin Roberts [id number] April 17, 1961 60 y.o.  Subjective:   Patient ID:  Melvin Roberts is a 60 y.o. (DOB 1960/09/26) male.  Chief Complaint: No chief complaint on file.   HPI Melvin Roberts presents to the office today for follow-up of Bipolar Disorder and insomnia.  Referred by PCP - has been managing Bipolar disorder medications.  Describes mood today as "ok". Pleasant. Mood symptoms - denies depression and irritability. Feels anxious at times - mostly related to son. Stating "I feel like I'm doing ok,  just a little hyper". Reports decreased stressors related to son.  He and wife doing well. Feels like current medications are helpful and would like to continue current dose. Stable interest and motivation. Taking medications as prescribed and feels like it works well for him.  Energy levels "better" Active, has a regular exercise routine.  Enjoys some usual interests and activities. Married. Lives with wife of 33 years. Has 5 children - all grown - 4 local. Spending time with family. Appetite adequate. Weight stable - 225 pounds. Sleeps well most nights. Averages 7 hours. Focus and concentration "been a little better". Completing tasks. Managing aspects of household. 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  Denies AH or VH.  Previous medication trials:  Lithium, Seroquel   GAD-7    Flowsheet Row [facility]/2020 [facility] Office Visit from 10/20/2020 in [facility] Office Visit from 08/10/2020 in [facility]  Total GAD-7 Score 4 8 10       PHQ2-9    Flowsheet Row Office Visit from 12/16/2020 in [facility] Office Visit from 10/20/2020 in [facility] Office Visit from 08/10/2020 in [facility] Office Visit from 07/02/2020 in [facility] Office Visit from 11/04/2019 in [facility]  PHQ-2 Total Score 2 1 0 0 0  PHQ-9 Total Score 8 3 3  -- --      Flowsheet Row ED from 09/20/2020 in [facility]  C-SSRS RISK CATEGORY No Risk        Review of Systems:  Review of Systems  Musculoskeletal:  Negative for gait problem.  Neurological:  Negative for tremors.  Psychiatric/Behavioral:         Please refer to HPI   Medications: I have reviewed the patient's current medications.  Current Outpatient Medications  Medication Sig Dispense Refill   benzonatate (TESSALON) 100 MG capsule Take 1 capsule (100 mg total) by mouth 3 (three) times daily as needed for cough. 30 capsule 0   cholecalciferol (VITAMIN D) 1000 UNITS tablet Take 1,000 Units by mouth daily.     fenofibrate (TRICOR) 145 MG tablet Take 1 tablet (145 mg total) by mouth daily. 90 tablet 1   fluticasone (FLONASE) 50 MCG/ACT nasal spray Place 2 sprays into both nostrils daily. 16 g 6   lisinopril (ZESTRIL) 10 MG tablet Take 1 tablet (10 mg total) by mouth daily. 90 tablet 1   lithium 300 MG tablet Take 2 tablets (600 mg total) by mouth daily. 180 tablet 1   pantoprazole (PROTONIX) 40 MG tablet Take 40 mg by mouth daily.     QUEtiapine (SEROQUEL) 50 MG tablet TAKE 1 TABLET BY MOUTH EVERYDAY AT BEDTIME 90 tablet 1   No current facility-administered medications for this visit.    Medication Side Effects: None  Allergies:  Allergies  Allergen Reactions   Lipitor [Atorvastatin]  Niaspan Odette Fraction Er]     Past Medical History:  Diagnosis Date   Bipolar 1 disorder (HCC)    Hyperlipidemia    Hypertension     Past Medical History, Surgical history, Social history, and Family history were reviewed and updated as appropriate.   Please see review of systems for further details on the patient's review from today.   Objective:   Physical Exam:  There were no vitals taken for this visit.  Physical Exam Constitutional:      General: He is not in acute  distress. Musculoskeletal:        General: No deformity.  Neurological:     Mental Status: He is alert and oriented to person, place, and time.     Coordination: Coordination normal.  Psychiatric:        Attention and Perception: Attention and perception normal. He does not perceive auditory or visual hallucinations.        Mood and Affect: Mood normal. Mood is not anxious or depressed. Affect is not labile, blunt, angry or inappropriate.        Speech: Speech normal.        Behavior: Behavior normal.        Thought Content: Thought content normal. Thought content is not paranoid or delusional. Thought content does not include homicidal or suicidal ideation. Thought content does not include homicidal or suicidal plan.        Cognition and Memory: Cognition and memory normal.        Judgment: Judgment normal.     Comments: Insight intact    Lab Review:     Component Value Date/Time   NA 141 12/16/2020 1655   K 5.0 12/16/2020 1655   CL 111 (H) 12/16/2020 1655   CO2 20 12/16/2020 1655   GLUCOSE 87 12/16/2020 1655   BUN 22 12/16/2020 1655   CREATININE 1.67 (H) 12/16/2020 1655   CALCIUM 10.1 12/16/2020 1655   PROT 6.7 12/16/2020 1655   ALBUMIN 4.4 12/16/2020 1655   AST 39 12/16/2020 1655   ALT 37 12/16/2020 1655   ALKPHOS 44 12/16/2020 1655   BILITOT <0.2 12/16/2020 1655   GFRNONAA 53 (L) 05/29/2020 0854   GFRAA 61 05/29/2020 0854       Component Value Date/Time   WBC 8.7 05/29/2020 0854   WBC 8.9 03/24/2014 1026   RBC 4.39 05/29/2020 0854   RBC 4.4 (A) 03/24/2014 1026   HGB 13.1 05/29/2020 0854   HCT 39.7 05/29/2020 0854   PLT 278 05/29/2020 0854   MCV 90 05/29/2020 0854   MCH 29.8 05/29/2020 0854   MCH 28.8 03/24/2014 1026   MCHC 33.0 05/29/2020 0854   MCHC 32.5 03/24/2014 1026   RDW 12.8 05/29/2020 0854   LYMPHSABS 1.5 05/29/2020 0854   EOSABS 0.2 05/29/2020 0854   BASOSABS 0.0 05/29/2020 0854    Lithium Lvl  Date Value Ref Range Status  12/16/2020 0.8 0.5 -  1.2 mmol/L Final    Comment:    Plasma concentration of 0.5 - 0.8 mmol/L are advised for long-term use; concentrations of up to 1.2 mmol/L may be necessary during acute treatment.                                  Detection Limit = 0.1                           <  0.1 indicates None Detected      No results found for: PHENYTOIN, PHENOBARB, VALPROATE, CBMZ   .res Assessment: Plan:     Plan:  PDMP reviewed  Seroquel 50mg  at hs Lithium 300mg  BID  Labs: TSH, Lithium, and BMP  RTC 4 weeks  Reviewed signs and symptoms of lithium toxicity, including; slurred speech, worsening tremors, nausea, vomiting, diarrhea, confusion and ataxia. Patient instructed to notify clinic if experiencing the symptoms or go to the ER if the symptoms are severe or occurring outside of office hours. Discussed potential drug interactions. Discussed hydration and risk of toxicity. Discussed avoiding diuretics or Motrin and advised patient to notify office if started on either. Discussed routine blood monitoring For renal and thyroid functioning due to potential long-term effects associated with lithium use.   Discussed potential metabolic side effects associated with atypical antipsychotics, as well as potential risk for movement side effects. Advised pt to contact office if movement side effects occur.     Patient advised to contact office with any questions, adverse effects, or acute worsening in signs and symptoms.  Diagnoses and all orders for this visit:  Bipolar I disorder (HCC) -     QUEtiapine (SEROQUEL) 50 MG tablet; TAKE 1 TABLET BY MOUTH EVERYDAY AT BEDTIME -     lithium 300 MG tablet; Take 2 tablets (600 mg total) by mouth daily.  Insomnia, unspecified type -     QUEtiapine (SEROQUEL) 50 MG tablet; TAKE 1 TABLET BY MOUTH EVERYDAY AT BEDTIME  High risk medication use -     Lithium level -     TSH -     Basic metabolic panel    Please see After Visit Summary for patient specific  instructions.  Future Appointments  Date Time Provider Department Center  03/10/2021  3:40 PM Tito Ausmus, , NP [facility] None  06/14/2021  2:10 PM Thereasa Solo, MD [facility] None    Orders Placed This Encounter  Procedures   Lithium level   TSH   Basic metabolic panel     -------------------------------

## 2021-02-10 NOTE — Progress Notes (Signed)
On my schedule by mistake. Pt not seen.

## 2021-02-11 ENCOUNTER — Telehealth: Payer: Self-pay

## 2021-02-11 ENCOUNTER — Other Ambulatory Visit: Payer: BC Managed Care – PPO

## 2021-02-11 NOTE — Telephone Encounter (Signed)
Labs on printer - changed to [employer].

## 2021-02-11 NOTE — Addendum Note (Signed)
Addended by: Dorothyann Gibbs on: 02/11/2021 04:11 PM   Modules accepted: Orders

## 2021-02-15 ENCOUNTER — Other Ambulatory Visit: Payer: Self-pay

## 2021-02-15 ENCOUNTER — Other Ambulatory Visit: Payer: BC Managed Care – PPO

## 2021-02-15 DIAGNOSIS — Z79899 Other long term (current) drug therapy: Secondary | ICD-10-CM | POA: Diagnosis not present

## 2021-02-16 LAB — BASIC METABOLIC PANEL
BUN/Creatinine Ratio: 13 (ref 9–20)
BUN: 24 mg/dL (ref 6–24)
CO2: 22 mmol/L (ref 20–29)
Calcium: 10.5 mg/dL — ABNORMAL HIGH (ref 8.7–10.2)
Chloride: 105 mmol/L (ref 96–106)
Creatinine, Ser: 1.81 mg/dL — ABNORMAL HIGH (ref 0.76–1.27)
Glucose: 99 mg/dL (ref 70–99)
Potassium: 5 mmol/L (ref 3.5–5.2)
Sodium: 136 mmol/L (ref 134–144)
eGFR: 43 mL/min/{1.73_m2} — ABNORMAL LOW (ref >59)

## 2021-02-16 LAB — LITHIUM LEVEL: Lithium Lvl: 0.9 mmol/L (ref 0.5–1.2)

## 2021-02-16 LAB — TSH: TSH: 2.82 u[IU]/mL (ref 0.450–4.500)

## 2021-03-05 ENCOUNTER — Ambulatory Visit: Payer: BC Managed Care – PPO | Admitting: Adult Health

## 2021-03-05 ENCOUNTER — Other Ambulatory Visit: Payer: Self-pay

## 2021-03-05 ENCOUNTER — Encounter: Payer: Self-pay | Admitting: Adult Health

## 2021-03-05 DIAGNOSIS — F319 Bipolar disorder, unspecified: Secondary | ICD-10-CM

## 2021-03-05 DIAGNOSIS — G4700 Insomnia, unspecified: Secondary | ICD-10-CM

## 2021-03-05 NOTE — Progress Notes (Signed)
BENZION MESTA [id number] [birth year] 60 y.o.  Subjective:   Patient ID:  Melvin Roberts is a 60 y.o. (DOB 1960/05/02) male.  Chief Complaint: No chief complaint on file.   HPI Melvin Roberts presents to the office today for follow-up of Bipolar Disorder and insomnia.  Referred by PCP - has been managing Bipolar disorder medications.  Describes mood today as "ok". Pleasant. Mood symptoms - denies depression. Feels a little anxious and irritable. Mood symptoms mostly related to worry about his son. Stating "I feel like I'm doing ok". He and wife doing well. Feels like current medications are working well. Stable interest and motivation. Taking medications as prescribed and feels like it works well for him.  Energy levels stable. Active, has a regular exercise routine.  Enjoys some usual interests and activities. Married. Lives with wife of 33 years. Has 5 children - all grown - 4 local. Spending time with family. Appetite adequate. Weight stable - 225 pounds. Sleeps well most nights. Averages 7 hours. Focus and concentration mostly stable. Completing tasks. Managing aspects of household. 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  Denies AH or VH.  Previous medication trials:  Lithium, Seroquel    GAD-7    Flowsheet Row [facility]16/2020 in [facility] Office Visit from [facility]/20/2020 in [facility] Office Visit from 08/10/2020 in [facility]  Total GAD-7 Score 4 8 10       PHQ2-9    Flowsheet Row Office Visit from 12/16/2020 in [facility] Office Visit from 10/20/2020 in [facility] Office Visit from 08/10/2020 in [facility] Office Visit from 07/02/2020 in [facility] Office Visit from 11/04/2019 in [facility]  PHQ-2 Total Score 2 1 0 0 0  PHQ-9 Total Score 8 3 3  -- --       Flowsheet Row ED from 09/20/2020 in [facility]  C-SSRS RISK CATEGORY No Risk        Review of Systems:  Review of Systems  Musculoskeletal:  Negative for gait problem.  Neurological:  Negative for tremors.  Psychiatric/Behavioral:         Please refer to HPI   Medications: I have reviewed the patient's current medications.  Current Outpatient Medications  Medication Sig Dispense Refill   benzonatate (TESSALON) 100 MG capsule Take 1 capsule (100 mg total) by mouth 3 (three) times daily as needed for cough. 30 capsule 0   cholecalciferol (VITAMIN D) 1000 UNITS tablet Take 1,000 Units by mouth daily.     fenofibrate (TRICOR) 145 MG tablet Take 1 tablet (145 mg total) by mouth daily. 90 tablet 1   fluticasone (FLONASE) 50 MCG/ACT nasal spray Place 2 sprays into both nostrils daily. 16 g 6   lisinopril (ZESTRIL) 10 MG tablet Take 1 tablet (10 mg total) by mouth daily. 90 tablet 1   lithium 300 MG tablet Take 2 tablets (600 mg total) by mouth daily. 180 tablet 1   pantoprazole (PROTONIX) 40 MG tablet Take 40 mg by mouth daily.     QUEtiapine (SEROQUEL) 50 MG tablet TAKE 1 TABLET BY MOUTH EVERYDAY AT BEDTIME 90 tablet 1   No current facility-administered medications for this visit.    Medication Side Effects: None  Allergies:  Allergies  Allergen Reactions   Lipitor [Atorvastatin]    Niaspan Er]     Past Medical History:  Diagnosis Date  Bipolar 1 disorder (HCC)    Hyperlipidemia    Hypertension     Past Medical History, Surgical history, Social history, and Family history were reviewed and updated as appropriate.   Please see review of systems for further details on the patient's review from today.   Objective:   Physical Exam:  There were no vitals taken for this visit.  Physical Exam Constitutional:      General: He is not in acute distress. Musculoskeletal:        General: No deformity.  Neurological:     Mental  Status: He is alert and oriented to person, place, and time.     Coordination: Coordination normal.  Psychiatric:        Attention and Perception: Attention and perception normal. He does not perceive auditory or visual hallucinations.        Mood and Affect: Mood normal. Mood is not anxious or depressed. Affect is not labile, blunt, angry or inappropriate.        Speech: Speech normal.        Behavior: Behavior normal.        Thought Content: Thought content normal. Thought content is not paranoid or delusional. Thought content does not include homicidal or suicidal ideation. Thought content does not include homicidal or suicidal plan.        Cognition and Memory: Cognition and memory normal.        Judgment: Judgment normal.     Comments: Insight intact    Lab Review:     Component Value Date/Time   NA 136 02/15/2021 1627   K 5.0 02/15/2021 1627   CL 105 02/15/2021 1627   CO2 22 02/15/2021 1627   GLUCOSE 99 02/15/2021 1627   BUN 24 02/15/2021 1627   CREATININE 1.81 (H) 02/15/2021 1627   CALCIUM 10.5 (H) 02/15/2021 1627   PROT 6.7 12/16/2020 1655   ALBUMIN 4.4 12/16/2020 1655   AST 39 12/16/2020 1655   ALT 37 12/16/2020 1655   ALKPHOS 44 12/16/2020 1655   BILITOT <0.2 12/16/2020 1655   GFRNONAA 53 (L) 05/29/2020 0854   GFRAA 61 05/29/2020 0854       Component Value Date/Time   WBC 8.7 05/29/2020 0854   WBC 8.9 03/24/2014 1026   RBC 4.39 05/29/2020 0854   RBC 4.4 (A) 03/24/2014 1026   HGB 13.1 05/29/2020 0854   HCT 39.7 05/29/2020 0854   PLT 278 05/29/2020 0854   MCV 90 05/29/2020 0854   MCH 29.8 05/29/2020 0854   MCH 28.8 03/24/2014 1026   MCHC 33.0 05/29/2020 0854   MCHC 32.5 03/24/2014 1026   RDW 12.8 05/29/2020 0854   LYMPHSABS 1.5 05/29/2020 0854   EOSABS 0.2 05/29/2020 0854   BASOSABS 0.0 05/29/2020 0854    Lithium Lvl  Date Value Ref Range Status  02/15/2021 0.9 0.5 - 1.2 mmol/L Final    Comment:    A concentration of 0.5-0.8 mmol/L is advised for  long-term use; concentrations of up to 1.2 mmol/L may be necessary during acute treatment.                                  Detection Limit = 0.1                           <0.1 indicates None Detected      No results found for: PHENYTOIN, PHENOBARB, VALPROATE,  CBMZ   .res Assessment: Plan:    Plan:  PDMP reviewed  Seroquel 50mg  at hs Lithium 300mg  BID  Labs: TSH, Lithium, and BMP  RTC 3 months  Reviewed signs and symptoms of lithium toxicity, including; slurred speech, worsening tremors, nausea, vomiting, diarrhea, confusion and ataxia. Patient instructed to notify clinic if experiencing the symptoms or go to the ER if the symptoms are severe or occurring outside of office hours. Discussed potential drug interactions. Discussed hydration and risk of toxicity. Discussed avoiding diuretics or Motrin and advised patient to notify office if started on either. Discussed routine blood monitoring For renal and thyroid functioning due to potential long-term effects associated with lithium use.   Discussed potential metabolic side effects associated with atypical antipsychotics, as well as potential risk for movement side effects. Advised pt to contact office if movement side effects occur.     Patient advised to contact office with any questions, adverse effects, or acute worsening in signs and symptoms. Diagnoses and all orders for this visit:  Bipolar I disorder (HCC)  Insomnia, unspecified type    Please see After Visit Summary for patient specific instructions.  Future Appointments  Date Time Provider Department Center  06/04/2021  4:00 PM Shadi Larner, , NP [facility] None  06/14/2021  2:10 PM Thereasa Solo, MD [facility] None    No orders of the defined types were placed in this encounter.   -------------------------------

## 2021-03-09 DIAGNOSIS — F439 Reaction to severe stress, unspecified: Secondary | ICD-10-CM | POA: Diagnosis not present

## 2021-03-10 ENCOUNTER — Ambulatory Visit: Payer: BC Managed Care – PPO | Admitting: Adult Health

## 2021-04-13 DIAGNOSIS — F439 Reaction to severe stress, unspecified: Secondary | ICD-10-CM | POA: Diagnosis not present

## 2021-06-04 ENCOUNTER — Ambulatory Visit: Payer: BC Managed Care – PPO | Admitting: Adult Health

## 2021-06-04 ENCOUNTER — Encounter: Payer: Self-pay | Admitting: Adult Health

## 2021-06-04 ENCOUNTER — Other Ambulatory Visit: Payer: Self-pay

## 2021-06-04 DIAGNOSIS — F319 Bipolar disorder, unspecified: Secondary | ICD-10-CM

## 2021-06-04 DIAGNOSIS — Z79899 Other long term (current) drug therapy: Secondary | ICD-10-CM | POA: Diagnosis not present

## 2021-06-04 DIAGNOSIS — G4700 Insomnia, unspecified: Secondary | ICD-10-CM | POA: Diagnosis not present

## 2021-06-04 MED ORDER — LITHIUM CARBONATE 300 MG PO TABS: 600.0000 mg | ORAL_TABLET | Freq: Every day | ORAL | 3 refills | Status: DC

## 2021-06-04 MED ORDER — QUETIAPINE FUMARATE 50 MG PO TABS: ORAL_TABLET | ORAL | 3 refills | Status: DC

## 2021-06-04 NOTE — Progress Notes (Signed)
LAWERENCE Roberts [id number] 09/08/1960 61 y.o.  Subjective:   Patient ID:  Melvin Roberts is a 61 y.o. (DOB [birth year]) male.  Chief Complaint: No chief complaint on file.   HPI DUONG HAYDEL presents to the office today for follow-up of Bipolar Disorder, high risk medication use.  insomnia.  Referred by PCP - has been managing Bipolar disorder medications.  Describes mood today as "ok". Pleasant. Mood symptoms - reporting some depression, anxiety, and irritability. Has considered an increase in medications. Feeling more stressed in the work setting. Increased stressors surrounding son.Stating "I feel like it's always something with him".He and wife doing well. Feels like current medications are working well. Stable interest and motivation. Taking medications as prescribed and feels like it works well for him - has considered an increase in the Lithium.  Energy levels stable. Active, has a regular exercise routine.  Enjoys some usual interests and activities. Married. Lives with wife of 33 years. Has 5 children - all grown - 4 local. Spending time with family. Appetite adequate. Weight stable - 225 pounds. Sleeps well most nights. Averages 7 hours. Focus and concentration mostly stable. Completing tasks. Managing aspects of household. 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  Denies AH or VH.  Previous medication trials:  Lithium, Seroquel   GAD-7    Flowsheet Row [facility]/2020 in [facility] Office Visit from [facility]/2020 in [facility] Office Visit from [facility]10/2020 [facility]  Total GAD-7 Score 4 8 10       PHQ2-9    Flowsheet Row Office Visit [facility] 12/16/2020 in [facility] Office Visit from 10/20/2020 in [facility] Office Visit from [facility]/10/2020 [facility] Office Visit from 11/04/2019 in [facility]  PHQ-2 Total Score 2 1 0 0 0  PHQ-9 Total Score 8 3 3  -- --      Flowsheet Row ED from 09/20/2020 in [facility]  C-SSRS RISK CATEGORY No Risk        Review of Systems:  Review of Systems  Musculoskeletal:  Negative for gait problem.  Neurological:  Negative for tremors.  Psychiatric/Behavioral:         Please refer to HPI   Medications: I have reviewed the patient's current medications.  Current Outpatient Medications  Medication Sig Dispense Refill   benzonatate (TESSALON) 100 MG capsule Take 1 capsule (100 mg total) by mouth 3 (three) times daily as needed for cough. 30 capsule 0   cholecalciferol (VITAMIN D) 1000 UNITS tablet Take 1,000 Units by mouth daily.     fenofibrate (TRICOR) 145 MG tablet Take 1 tablet (145 mg total) by mouth daily. 90 tablet 1   fluticasone (FLONASE) 50 MCG/ACT nasal spray Place 2 sprays into both nostrils daily. 16 g 6   lisinopril (ZESTRIL) 10 MG tablet Take 1 tablet (10 mg total) by mouth daily. 90 tablet 1   lithium 300 MG tablet Take 2 tablets (600 mg total) by mouth daily. 180 tablet 3   pantoprazole (PROTONIX) 40 MG tablet Take 40 mg by mouth daily.     QUEtiapine (SEROQUEL) 50 MG tablet TAKE 1 TABLET BY MOUTH EVERYDAY AT BEDTIME 90 tablet 3   No current facility-administered medications for this visit.    Medication Side Effects: None  Allergies:  Allergies  Allergen Reactions   Lipitor [  Atorvastatin]    Niaspan Odette Fraction Er]     Past Medical History:  Diagnosis Date   Bipolar 1 disorder (HCC)    Hyperlipidemia    Hypertension     Past Medical History, Surgical history, Social history, and Family history were reviewed and updated as appropriate.   Please see review of systems for further details on the patient's review from today.   Objective:   Physical Exam:  There were no vitals taken for this  visit.  Physical Exam Constitutional:      General: He is not in acute distress. Musculoskeletal:        General: No deformity.  Neurological:     Mental Status: He is alert and oriented to person, place, and time.     Coordination: Coordination normal.  Psychiatric:        Attention and Perception: Attention and perception normal. He does not perceive auditory or visual hallucinations.        Mood and Affect: Mood normal. Mood is not anxious or depressed. Affect is not labile, blunt, angry or inappropriate.        Speech: Speech normal.        Behavior: Behavior normal.        Thought Content: Thought content normal. Thought content is not paranoid or delusional. Thought content does not include homicidal or suicidal ideation. Thought content does not include homicidal or suicidal plan.        Cognition and Memory: Cognition and memory normal.        Judgment: Judgment normal.     Comments: Insight intact    Lab Review:     Component Value Date/Time   NA 136 02/15/2021 1627   K 5.0 02/15/2021 1627   CL 105 02/15/2021 1627   CO2 22 02/15/2021 1627   GLUCOSE 99 02/15/2021 1627   BUN 24 02/15/2021 1627   CREATININE 1.81 (H) 02/15/2021 1627   CALCIUM 10.5 (H) 02/15/2021 1627   PROT 6.7 12/16/2020 1655   ALBUMIN 4.4 12/16/2020 1655   AST 39 12/16/2020 1655   ALT 37 12/16/2020 1655   ALKPHOS 44 12/16/2020 1655   BILITOT <0.2 12/16/2020 1655   GFRNONAA 53 (L) 05/29/2020 0854   GFRAA 61 05/29/2020 0854       Component Value Date/Time   WBC 8.7 05/29/2020 0854   WBC 8.9 03/24/2014 1026   RBC 4.39 05/29/2020 0854   RBC 4.4 (A) 03/24/2014 1026   HGB 13.1 05/29/2020 0854   HCT 39.7 05/29/2020 0854   PLT 278 05/29/2020 0854   MCV 90 05/29/2020 0854   MCH 29.8 05/29/2020 0854   MCH 28.8 03/24/2014 1026   MCHC 33.0 05/29/2020 0854   MCHC 32.5 03/24/2014 1026   RDW 12.8 05/29/2020 0854   LYMPHSABS 1.5 05/29/2020 0854   EOSABS 0.2 05/29/2020 0854   BASOSABS 0.0 05/29/2020  0854    Lithium Lvl  Date Value Ref Range Status  02/15/2021 0.9 0.5 - 1.2 mmol/L Final    Comment:    A concentration of 0.5-0.8 mmol/L is advised for long-term use; concentrations of up to 1.2 mmol/L may be necessary during acute treatment.                                  Detection Limit = 0.1                           <  0.1 indicates None Detected      No results found for: PHENYTOIN, PHENOBARB, VALPROATE, CBMZ   .res Assessment: Plan:    Plan:  PDMP reviewed  Seroquel 50mg  at hs Lithium 300mg  BID  Labs: TSH, Lithium, and BMP  RTC 3 months   Time spent with patient was 25 minutes. Greater than 50% of face to face time with patient was spent on counseling and coordination of care.   Reviewed signs and symptoms of lithium toxicity, including; slurred speech, worsening tremors, nausea, vomiting, diarrhea, confusion and ataxia. Patient instructed to notify clinic if experiencing the symptoms or go to the ER if the symptoms are severe or occurring outside of office hours. Discussed potential drug interactions. Discussed hydration and risk of toxicity. Discussed avoiding diuretics or Motrin and advised patient to notify office if started on either. Discussed routine blood monitoring For renal and thyroid functioning due to potential long-term effects associated with lithium use.   Discussed potential metabolic side effects associated with atypical antipsychotics, as well as potential risk for movement side effects. Advised pt to contact office if movement side effects occur.    Patient advised to contact office with any questions, adverse effects, or acute worsening in signs and symptoms.  Diagnoses and all orders for this visit:  High risk medication use -     TSH -     Basic metabolic panel -     Lithium level  Bipolar I disorder (HCC) -     QUEtiapine (SEROQUEL) 50 MG tablet; TAKE 1 TABLET BY MOUTH EVERYDAY AT BEDTIME -     lithium 300 MG tablet; Take 2 tablets (600  mg total) by mouth daily.  Insomnia, unspecified type -     QUEtiapine (SEROQUEL) 50 MG tablet; TAKE 1 TABLET BY MOUTH EVERYDAY AT BEDTIME     Please see After Visit Summary for patient specific instructions.  Future Appointments  Date Time Provider Department Center  06/14/2021  2:10 PM Mechele Claude, MD [facility] None  09/01/2021  4:20 PM Lynnsie Linders, Thereasa Solo, NP [facility] None    Orders Placed This Encounter  Procedures   TSH   Basic metabolic panel   Lithium level    -------------------------------

## 2021-06-07 ENCOUNTER — Other Ambulatory Visit (HOSPITAL_COMMUNITY)
Admission: RE | Admit: 2021-06-07 | Discharge: 2021-06-07 | Disposition: A | Discharge status: Home / Self Care | Payer: BC Managed Care – PPO | Source: Ambulatory Visit | Attending: Adult Health | Admitting: Adult Health

## 2021-06-07 ENCOUNTER — Other Ambulatory Visit: Payer: Self-pay

## 2021-06-07 DIAGNOSIS — Z79899 Other long term (current) drug therapy: Secondary | ICD-10-CM | POA: Insufficient documentation

## 2021-06-07 LAB — BASIC METABOLIC PANEL
Anion gap: 6 (ref 5–15)
BUN: 25 mg/dL — ABNORMAL HIGH (ref 6–20)
CO2: 23 mmol/L (ref 22–32)
Calcium: 10.5 mg/dL — ABNORMAL HIGH (ref 8.9–10.3)
Chloride: 108 mmol/L (ref 98–111)
Creatinine, Ser: 1.49 mg/dL — ABNORMAL HIGH (ref 0.61–1.24)
GFR, Estimated: 53 mL/min — ABNORMAL LOW (ref >60)
Glucose, Bld: 102 mg/dL — ABNORMAL HIGH (ref 70–99)
Potassium: 5.1 mmol/L (ref 3.5–5.1)
Sodium: 137 mmol/L (ref 135–145)

## 2021-06-07 LAB — TSH: TSH: 1.141 u[IU]/mL (ref 0.350–4.500)

## 2021-06-07 LAB — LITHIUM LEVEL: Lithium Lvl: 1.07 mmol/L (ref 0.60–1.20)

## 2021-06-08 ENCOUNTER — Encounter: Payer: Self-pay | Admitting: Nurse Practitioner

## 2021-06-08 ENCOUNTER — Ambulatory Visit: Payer: BC Managed Care – PPO | Admitting: Nurse Practitioner

## 2021-06-08 VITALS — BP 113/77 | HR 66 | Temp 98.3°F | Ht 71.0 in | Wt 231.8 lb

## 2021-06-08 DIAGNOSIS — M549 Dorsalgia, unspecified: Secondary | ICD-10-CM

## 2021-06-08 DIAGNOSIS — M5430 Sciatica, unspecified side: Secondary | ICD-10-CM | POA: Diagnosis not present

## 2021-06-08 DIAGNOSIS — M5441 Lumbago with sciatica, right side: Secondary | ICD-10-CM | POA: Diagnosis not present

## 2021-06-08 MED ORDER — METHOCARBAMOL 500 MG PO TABS: 500.0000 mg | ORAL_TABLET | Freq: Four times a day (QID) | ORAL | 0 refills | Status: DC

## 2021-06-08 NOTE — Patient Instructions (Signed)

## 2021-06-08 NOTE — Assessment & Plan Note (Signed)
Low back pain with sciatica not well controlled.  Patient reports symptoms worsened when he tried pulling some boxes at home.  Encourage patient to put a warm compress, rest his joint, Tylenol as he is unable to use an anti-inflammatory, Robaxin 500 mg tablet by mouth every 4 hours as needed for muscle spasm.  Rx sent to phramacy   Follow up wit worsening unresolved symptoms.

## 2021-06-08 NOTE — Progress Notes (Signed)
Acute Office Visit  Subjective:    Patient ID: Melvin Roberts, male    DOB: [birth year], 61 y.o.   MRN: [id number]  Chief Complaint  Patient presents with   Back Pain    Had lower r-back pain since Saturday hauling things off    Back Pain This is a recurrent problem. The current episode started in the past 7 days. The problem occurs intermittently. The problem has been gradually worsening since onset. The pain is present in the lumbar spine. The quality of the pain is described as aching. The pain radiates to the right thigh. The pain is at a severity of 8/10. The pain is The same all the time. The symptoms are aggravated by position. Stiffness is present In the morning and all day. Pertinent negatives include no bowel incontinence, chest pain, numbness, paresthesias, pelvic pain or perianal numbness. He has tried nothing for the symptoms. The treatment provided no relief.    Past Medical History:  Diagnosis Date   Bipolar 1 disorder ([place])    Hyperlipidemia    Hypertension     Past Surgical History:  Procedure Laterality Date   BACK SURGERY  11/17/95   Dr. Tonita Cong   EYE SURGERY     HERNIA REPAIR  3875   umbilical   UPPER GASTROINTESTINAL ENDOSCOPY  x2   w/ esophageal dilation    Family History  Problem Relation Age of Onset   Cancer Mother        breast   Cancer Father        lug   Cancer Maternal Grandfather    Cancer Paternal Grandfather    Colon cancer Neg Hx     Social History   Socioeconomic History   Marital status: Married    Spouse name: Not on file   Number of children: Not on file   Years of education: Not on file   Highest education level: Not on file  Occupational History   Not on file  Tobacco Use   Smoking status: Never   Smokeless tobacco: Never  Vaping Use   Vaping Use: Never used  Substance and Sexual Activity   Alcohol use: No   Drug use: No   Sexual activity: Not on file  Other Topics Concern   Not on file  Social History  Narrative   Not on file   Social Determinants of Health   Financial Resource Strain: Not on file  Food Insecurity: Not on file  Transportation Needs: Not on file  Physical Activity: Not on file  Stress: Not on file  Social Connections: Not on file  Intimate Partner Violence: Not on file    Outpatient Medications Prior to Visit  Medication Sig Dispense Refill   benzonatate (TESSALON) 100 MG capsule Take 1 capsule (100 mg total) by mouth 3 (three) times daily as needed for cough. 30 capsule 0   cholecalciferol (VITAMIN D) 1000 UNITS tablet Take 1,000 Units by mouth daily.     fenofibrate (TRICOR) 145 MG tablet Take 1 tablet (145 mg total) by mouth daily. 90 tablet 1   fluticasone (FLONASE) 50 MCG/ACT nasal spray Place 2 sprays into both nostrils daily. 16 g 6   lisinopril (ZESTRIL) 10 MG tablet Take 1 tablet (10 mg total) by mouth daily. 90 tablet 1   lithium 300 MG tablet Take 2 tablets (600 mg total) by mouth daily. 180 tablet 3   pantoprazole (PROTONIX) 40 MG tablet Take 40 mg by mouth daily.  QUEtiapine (SEROQUEL) 50 MG tablet TAKE 1 TABLET BY MOUTH EVERYDAY AT BEDTIME 90 tablet 3   No facility-administered medications prior to visit.    Allergies  Allergen Reactions   Lipitor [Atorvastatin]    Niaspan [Niacin Er]     Review of Systems  Constitutional: Negative.   HENT: Negative.    Eyes: Negative.   Respiratory: Negative.    Cardiovascular:  Negative for chest pain.  Gastrointestinal: Negative.  Negative for bowel incontinence.  Genitourinary:  Negative for pelvic pain.  Musculoskeletal:  Positive for back pain.  Neurological:  Negative for numbness and paresthesias.  All other systems reviewed and are negative.     Objective:    Physical Exam Vitals and nursing note reviewed.  Constitutional:      Appearance: Normal appearance.  HENT:     Head: Normocephalic.     Right Ear: External ear normal.     Left Ear: External ear normal.     Nose: Nose normal.      Mouth/Throat:     Mouth: Mucous membranes are moist.     Pharynx: Oropharynx is clear.  Eyes:     Conjunctiva/sclera: Conjunctivae normal.  Cardiovascular:     Rate and Rhythm: Normal rate and regular rhythm.     Pulses: Normal pulses.     Heart sounds: Normal heart sounds.  Pulmonary:     Effort: Pulmonary effort is normal.  Abdominal:     General: Bowel sounds are normal.  Musculoskeletal:     Lumbar back: Tenderness present. Decreased range of motion.  Skin:    General: Skin is warm.     Findings: No rash.  Neurological:     Mental Status: He is alert and oriented to person, place, and time.  Psychiatric:        Behavior: Behavior normal.    BP 113/77    Pulse 66    Temp 98.3 F (36.8 C) (Temporal)    Ht '5\' 11"'  (1.803 m)    Wt 231 lb 12.8 oz (105.1 kg)    SpO2 98%    BMI 32.33 kg/m  Wt Readings from Last 3 Encounters:  06/08/21 231 lb 12.8 oz (105.1 kg)  12/16/20 234 lb (106.1 kg)  10/20/20 223 lb 9.6 oz (101.4 kg)    Health Maintenance Due  Topic Date Due   HIV Screening  Never done   Hepatitis C Screening  Never done   Zoster Vaccines- Shingrix (1 of 2) Never done   COVID-19 Vaccine (3 - Pfizer risk series) 08/11/2019    There are no preventive care reminders to display for this patient.   Lab Results  Component Value Date   TSH 1.141 06/07/2021   Lab Results  Component Value Date   WBC 8.7 05/29/2020   HGB 13.1 05/29/2020   HCT 39.7 05/29/2020   MCV 90 05/29/2020   PLT 278 05/29/2020   Lab Results  Component Value Date   NA 137 06/07/2021   K 5.1 06/07/2021   CO2 23 06/07/2021   GLUCOSE 102 (H) 06/07/2021   BUN 25 (H) 06/07/2021   CREATININE 1.49 (H) 06/07/2021   BILITOT <0.2 12/16/2020   ALKPHOS 44 12/16/2020   AST 39 12/16/2020   ALT 37 12/16/2020   PROT 6.7 12/16/2020   ALBUMIN 4.4 12/16/2020   CALCIUM 10.5 (H) 06/07/2021   ANIONGAP 6 06/07/2021   EGFR 43 (L) 02/15/2021   Lab Results  Component Value Date   CHOL 181 12/16/2020    Lab  Results  Component Value Date   HDL 34 (L) 12/16/2020   Lab Results  Component Value Date   LDLCALC 109 (H) 12/16/2020   Lab Results  Component Value Date   TRIG 219 (H) 12/16/2020   Lab Results  Component Value Date   CHOLHDL 5.3 (H) 12/16/2020   Lab Results  Component Value Date   HGBA1C 5.2 11/04/2019       Assessment & Plan:   Problem List Items Addressed This Visit       Other   LOW BACK PAIN, CHRONIC    Low back pain with sciatica not well controlled.  Patient reports symptoms worsened when he tried pulling some boxes at home.  Encourage patient to put a warm compress, rest his joint, Tylenol as he is unable to use an anti-inflammatory, Robaxin 500 mg tablet by mouth every 4 hours as needed for muscle spasm.  Rx sent to phramacy   Follow up wit worsening unresolved symptoms.       Relevant Medications   methocarbamol (ROBAXIN) 500 MG tablet   Other Visit Diagnoses     Back pain with sciatica    -  Primary   Relevant Medications   methocarbamol (ROBAXIN) 500 MG tablet        Meds ordered this encounter  Medications   methocarbamol (ROBAXIN) 500 MG tablet    Sig: Take 1 tablet (500 mg total) by mouth 4 (four) times daily.    Dispense:  60 tablet    Refill:  0    Order Specific Question:   Supervising Provider    Answer:   Claretta Fraise [[id number]]     Ivy Lynn, NP

## 2021-06-14 ENCOUNTER — Encounter: Payer: BC Managed Care – PPO | Admitting: Family Medicine

## 2021-06-24 ENCOUNTER — Ambulatory Visit: Payer: BC Managed Care – PPO | Admitting: Family Medicine

## 2021-07-15 ENCOUNTER — Encounter: Payer: Self-pay | Admitting: Family Medicine

## 2021-07-15 ENCOUNTER — Ambulatory Visit: Payer: BC Managed Care – PPO | Admitting: Family Medicine

## 2021-07-15 VITALS — BP 107/68 | HR 68 | Temp 97.5°F | Ht 71.0 in | Wt 229.2 lb

## 2021-07-15 DIAGNOSIS — J069 Acute upper respiratory infection, unspecified: Secondary | ICD-10-CM

## 2021-07-15 LAB — CULTURE, GROUP A STREP

## 2021-07-15 LAB — VERITOR FLU A/B WAIVED
Influenza A: NEGATIVE
Influenza B: NEGATIVE

## 2021-07-15 LAB — RAPID STREP SCREEN (MED CTR MEBANE ONLY): Strep Gp A Ag, IA W/Reflex: NEGATIVE

## 2021-07-15 MED ORDER — PSEUDOEPH-BROMPHEN-DM 30-2-10 MG/5ML PO SYRP: 5.0000 mL | ORAL_SOLUTION | Freq: Four times a day (QID) | ORAL | 0 refills | Status: DC | PRN

## 2021-07-15 NOTE — Progress Notes (Addendum)
?  ? ?Subjective:  ?Patient ID: Melvin Roberts, male    DOB: 05-05-1960, 61 y.o.   MRN: [id number] ? ?Patient Care Team: ?Mechele Claude, MD as PCP - General (Family Medicine)  ? ?Chief Complaint:  Headache, Chills, and Cough ? ? ?HPI: ?Melvin Roberts is a 61 y.o. male presenting on 07/15/2021 for Headache, Chills, and Cough ? ? ?Tuesday onset of productive cough, headache, chills, sinus pressure. Initially he had a sore throat which as now resolved. He has been using tessalon with mild relief.  ? ?Headache  ?Associated symptoms include coughing and sinus pressure. Pertinent negatives include no fever, rhinorrhea or sore throat.  ?Cough ?This is a new problem. The current episode started in the past 7 days. The cough is Productive of sputum. Associated symptoms include headaches. Pertinent negatives include no fever, postnasal drip, rhinorrhea, sore throat or wheezing. Treatments tried: Tessalon. The treatment provided mild relief.  ? ? ?Relevant past medical, surgical, family, and social history reviewed and updated as indicated.  ?Allergies and medications reviewed and updated. Data reviewed: Chart in Epic. ? ? ?Past Medical History:  ?Diagnosis Date  ? Bipolar 1 disorder ([facility])   ? Hyperlipidemia   ? Hypertension   ? ? ?Past Surgical History:  ?Procedure Laterality Date  ? BACK SURGERY  11/17/95  ? Dr. Shelle Iron  ? EYE SURGERY    ? HERNIA REPAIR  2009  ? umbilical  ? UPPER GASTROINTESTINAL ENDOSCOPY  x2  ? w/ esophageal dilation  ? ? ?Social History  ? ?Socioeconomic History  ? Marital status: Married  ?  Spouse name: Not on file  ? Number of children: Not on file  ? Years of education: Not on file  ? Highest education level: Not on file  ?Occupational History  ? Not on file  ?Tobacco Use  ? Smoking status: Never  ? Smokeless tobacco: Never  ?Vaping Use  ? Vaping Use: Never used  ?Substance and Sexual Activity  ? Alcohol use: No  ? Drug use: No  ? Sexual activity: Not on file  ?Other Topics Concern  ? Not on  file  ?Social History Narrative  ? Not on file  ? ?Social Determinants of Health  ? ?Financial Resource Strain: Not on file  ?Food Insecurity: Not on file  ?Transportation Needs: Not on file  ?Physical Activity: Not on file  ?Stress: Not on file  ?Social Connections: Not on file  ?Intimate Partner Violence: Not on file  ? ? ?Outpatient Encounter Medications as of 07/15/2021  ?Medication Sig  ? benzonatate (TESSALON) 100 MG capsule Take 1 capsule (100 mg total) by mouth 3 (three) times daily as needed for cough.  ? brompheniramine-pseudoephedrine-DM 30-2-10 MG/5ML syrup Take 5 mLs by mouth 4 (four) times daily as needed.  ? cholecalciferol (VITAMIN D) 1000 UNITS tablet Take 1,000 Units by mouth daily.  ? fenofibrate (TRICOR) 145 MG tablet Take 1 tablet (145 mg total) by mouth daily.  ? fluticasone (FLONASE) 50 MCG/ACT nasal spray Place 2 sprays into both nostrils daily.  ? lisinopril (ZESTRIL) 10 MG tablet Take 1 tablet (10 mg total) by mouth daily.  ? lithium 300 MG tablet Take 2 tablets (600 mg total) by mouth daily.  ? methocarbamol (ROBAXIN) 500 MG tablet Take 1 tablet (500 mg total) by mouth 4 (four) times daily.  ? pantoprazole (PROTONIX) 40 MG tablet Take 40 mg by mouth daily.  ? QUEtiapine (SEROQUEL) 50 MG tablet TAKE 1 TABLET BY MOUTH EVERYDAY AT BEDTIME  ? ?  No facility-administered encounter medications on file as of 07/15/2021.  ? ? ?Allergies  ?Allergen Reactions  ? Lipitor [Atorvastatin]   ? Niaspan [Niacin Er]   ? ? ?Review of Systems  ?Constitutional:  Negative for fever.  ?HENT:  Positive for sinus pressure. Negative for postnasal drip, rhinorrhea and sore throat.   ?Respiratory:  Positive for cough. Negative for wheezing.   ?Neurological:  Positive for headaches.  ?All other systems reviewed and are negative. ? ?   ? ?Objective:  ?BP 107/68   Pulse 68   Temp (!) 97.5 ?F (36.4 ?C)   Ht 5\' 11"  (1.803 m)   Wt 104 kg   SpO2 100%   BMI 31.97 kg/m?   ? ?Wt Readings from Last 3 Encounters:  ?07/15/21  104 kg  ?06/08/21 105.1 kg  ?12/16/20 106.1 kg  ? ? ?Physical Exam ?Vitals and nursing note reviewed.  ?Constitutional:   ?   Appearance: He is well-developed and normal weight.  ?HENT:  ?   Right Ear: Hearing and tympanic membrane normal.  ?   Left Ear: Hearing and tympanic membrane normal.  ?   Mouth/Throat:  ?   Pharynx: Oropharynx is clear.  ?Cardiovascular:  ?   Rate and Rhythm: Normal rate and regular rhythm.  ?Pulmonary:  ?   Effort: Pulmonary effort is normal.  ?   Breath sounds: Normal breath sounds. No wheezing or rhonchi.  ?Musculoskeletal:     ?   General: Normal range of motion.  ?   Cervical back: Normal range of motion.  ?Skin: ?   General: Skin is warm and dry.  ?Neurological:  ?   General: No focal deficit present.  ?   Mental Status: He is alert and oriented to person, place, and time. Mental status is at baseline.  ?Psychiatric:     ?   Mood and Affect: Mood normal.     ?   Behavior: Behavior normal.     ?   Thought Content: Thought content normal.     ?   Judgment: Judgment normal.  ? ? ?Results for orders placed or performed during the hospital encounter of 06/07/21  ?TSH  ?Result Value Ref Range  ? TSH 1.141 0.350 - 4.500 uIU/mL  ?Basic metabolic panel  ?Result Value Ref Range  ? Sodium 137 135 - 145 mmol/L  ? Potassium 5.1 3.5 - 5.1 mmol/L  ? Chloride 108 98 - 111 mmol/L  ? CO2 23 22 - 32 mmol/L  ? Glucose, Bld 102 (H) 70 - 99 mg/dL  ? BUN 25 (H) 6 - 20 mg/dL  ? Creatinine, Ser 1.49 (H) 0.61 - 1.24 mg/dL  ? Calcium 10.5 (H) 8.9 - 10.3 mg/dL  ? GFR, Estimated 53 (L) >60 mL/min  ? Anion gap 6 5 - 15  ?Lithium level  ?Result Value Ref Range  ? Lithium Lvl 1.07 0.60 - 1.20 mmol/L  ? ?   ? ?Pertinent labs & imaging results that were available during my care of the patient were reviewed by me and considered in my medical decision making. ? ?Assessment & Plan:  ?Cassey was seen today for headache, chills and cough. ? ?Diagnoses and all orders for this visit: ? ?URI with cough and congestion ?-      Veritor Flu A/B Waived ?-     Rapid Strep Screen ([facility] ONLY) ?-     Novel Coronavirus, NAA ([facility]) ?-     Viral processes negative. No concern for bacterial organism  at this time. Continue symptomatic care and adequate hydration.  ? ?  ? ?Continue all other maintenance medications. ? ?Follow up plan: ?Return if symptoms worsen or fail to improve. ? ? ?Continue healthy lifestyle choices, including diet (rich in fruits, vegetables, and lean proteins, and low in salt and simple carbohydrates) and exercise (at least 30 minutes of moderate physical activity daily). ? ?Educational handout given for URI ? ?The above assessment and management plan was discussed with the patient. The patient verbalized understanding of and has agreed to the management plan. Patient is aware to call the clinic if they develop any new symptoms or if symptoms persist or worsen. Patient is aware when to return to the clinic for a follow-up visit. Patient educated on when it is appropriate to go to the emergency department.  ? ?Addison Young Brim, NP-S ? ?I personally was present during the history, physical exam, and medical decision-making activities of this visit and have verified that the services and findings are accurately documented in the nurse practitioner student's note. ? ?Kari BaarsMichelle Rakes, FNP-C ?[facility] ?[address] ?[place], [place] [id number] ?[phone] ? ?

## 2021-07-16 LAB — NOVEL CORONAVIRUS, NAA: SARS-CoV-2, NAA: NOT DETECTED

## 2021-07-23 ENCOUNTER — Other Ambulatory Visit: Payer: Self-pay | Admitting: Family Medicine

## 2021-07-23 DIAGNOSIS — I10 Essential (primary) hypertension: Secondary | ICD-10-CM

## 2021-07-28 ENCOUNTER — Ambulatory Visit (INDEPENDENT_AMBULATORY_CARE_PROVIDER_SITE_OTHER): Payer: BC Managed Care – PPO | Admitting: Family Medicine

## 2021-07-28 ENCOUNTER — Encounter: Payer: Self-pay | Admitting: Family Medicine

## 2021-07-28 VITALS — BP 115/68 | HR 75 | Temp 98.3°F | Ht 71.0 in | Wt 235.4 lb

## 2021-07-28 DIAGNOSIS — G4700 Insomnia, unspecified: Secondary | ICD-10-CM

## 2021-07-28 DIAGNOSIS — Z0001 Encounter for general adult medical examination with abnormal findings: Secondary | ICD-10-CM | POA: Diagnosis not present

## 2021-07-28 DIAGNOSIS — I10 Essential (primary) hypertension: Secondary | ICD-10-CM | POA: Diagnosis not present

## 2021-07-28 DIAGNOSIS — E559 Vitamin D deficiency, unspecified: Secondary | ICD-10-CM | POA: Diagnosis not present

## 2021-07-28 DIAGNOSIS — Z0000 Encounter for general adult medical examination without abnormal findings: Secondary | ICD-10-CM | POA: Diagnosis not present

## 2021-07-28 DIAGNOSIS — F319 Bipolar disorder, unspecified: Secondary | ICD-10-CM

## 2021-07-28 DIAGNOSIS — E785 Hyperlipidemia, unspecified: Secondary | ICD-10-CM | POA: Diagnosis not present

## 2021-07-28 DIAGNOSIS — E782 Mixed hyperlipidemia: Secondary | ICD-10-CM

## 2021-07-28 MED ORDER — FENOFIBRATE 145 MG PO TABS: 145.0000 mg | ORAL_TABLET | Freq: Every day | ORAL | 3 refills | Status: DC

## 2021-07-28 MED ORDER — LISINOPRIL 10 MG PO TABS: 10.0000 mg | ORAL_TABLET | Freq: Every day | ORAL | 3 refills | Status: DC

## 2021-07-28 MED ORDER — QUETIAPINE FUMARATE 100 MG PO TABS: ORAL_TABLET | ORAL | 1 refills | Status: DC

## 2021-07-28 NOTE — Progress Notes (Signed)
? ?Subjective:  ?Patient ID: Melvin Roberts, male    DOB: 30-Oct-1960  Age: 61 y.o. MRN: [id number] ? ?CC: Annual Exam ? ? ?HPI ?[name] presents for CPE. ? ? ? ?  07/28/2021  ?  4:16 PM 07/28/2021  ?  4:07 PM 07/15/2021  ? 11:16 AM  ?Depression screen PHQ 2/9  ?Decreased Interest 1 0 1  ?Down, Depressed, Hopeless 1 0 1  ?PHQ - 2 Score 2 0 2  ?Altered sleeping 0  0  ?Tired, decreased energy 1  0  ?Change in appetite 1  0  ?Feeling bad or failure about yourself  2  0  ?Trouble concentrating 1  1  ?Moving slowly or fidgety/restless 0  0  ?Suicidal thoughts 2  1  ?PHQ-9 Score 9  4  ?Difficult doing work/chores Somewhat difficult  Somewhat difficult  ? 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 Feels beaten down. Doesn't see any hope as a result. Family keeping him stable. ?History ?Jaken has a past medical history of Bipolar 1 disorder ([place]), Hyperlipidemia, and Hypertension.  ? ?He has a past surgical history that includes Back surgery (11/17/95); Hernia repair (2009); Eye surgery; and Upper gastrointestinal endoscopy (x2).  ? ?His family history includes Cancer in his father, maternal grandfather, mother, and paternal grandfather.He reports that he has never smoked. He has never used smokeless tobacco. He reports that he does not drink alcohol and does not use drugs. ? ? ? ?ROS ?Review of Systems  ?Constitutional:  Negative for activity change, fatigue and unexpected weight change.  ?HENT:  Negative for congestion, ear pain, hearing loss, postnasal drip and trouble swallowing.   ?Eyes:  Negative for pain and visual disturbance.  ?Respiratory:  Negative for cough, chest tightness and shortness of breath.   ?Cardiovascular:  Negative for chest pain, palpitations and leg swelling.  ?Gastrointestinal:  Negative for abdominal distention, abdominal pain, blood in stool, constipation, diarrhea, nausea and vomiting.  ?Endocrine: Negative for cold intolerance,  heat intolerance and polydipsia.  ?Genitourinary:  Negative for difficulty urinating, dysuria, flank pain, frequency and urgency.  ?Musculoskeletal:  Negative for arthralgias and joint swelling.  ?Skin:  Negative for color change, rash and wound.  ?Neurological:  Negative for dizziness, syncope, speech difficulty, weakness, light-headedness, numbness and headaches.  ?Hematological:  Does not bruise/bleed easily.  ?Psychiatric/Behavioral:  Negative for confusion, decreased concentration, dysphoric mood and sleep disturbance. The patient is not nervous/anxious.   ? ?Objective:  ?BP 115/68   Pulse 75   Temp 98.3 ?F (36.8 ?C)   Ht _0  (1.803 m)   Wt 235 lb 6.4 oz (106.8 kg)   SpO2 97%   BMI 32.83 kg/m?  ? ?BP Readings from Last 3 Encounters:  ?07/28/21 115/68  ?07/15/21 107/68  ?06/08/21 113/77  ? ? ?Wt Readings from Last 3 Encounters:  ?07/28/21 235 lb 6.4 oz (106.8 kg)  ?07/15/21 229 lb 3.2 oz (104 kg)  ?06/08/21 231 lb 12.8 oz (105.1 kg)  ? ? ? ?Physical Exam ?Constitutional:   ?   Appearance: He is well-developed.  ?HENT:  ?   Head: Normocephalic and atraumatic.  ?Eyes:  ?   Pupils: Pupils are equal, round, and reactive to light.  ?Neck:  ?   Thyroid: No thyromegaly.  ?   Trachea: No tracheal deviation.  ?Cardiovascular:  ?   Rate and Rhythm: Normal rate and regular rhythm.  ?   Heart sounds: Normal heart sounds. No murmur  heard. ?  No friction rub. No gallop.  ?Pulmonary:  ?   Breath sounds: Normal breath sounds. No wheezing or rales.  ?Abdominal:  ?   General: Bowel sounds are normal. There is no distension.  ?   Palpations: Abdomen is soft. There is no mass.  ?   Tenderness: There is no abdominal tenderness.  ?   Hernia: There is no hernia in the left inguinal area.  ?Genitourinary: ?   Penis: Normal.   ?   Testes: Normal.  ?Musculoskeletal:     ?   General: Normal range of motion.  ?   Cervical back: Normal range of motion.  ?Lymphadenopathy:  ?   Cervical: No cervical adenopathy.  ?Skin: ?   General:  Skin is warm and dry.  ?Neurological:  ?   Mental Status: He is alert and oriented to person, place, and time.  ? ? ? ? ?Assessment & Plan:  ? ?[place] was seen today for annual exam. ? ?Diagnoses and all orders for this visit: ? ?Essential hypertension ?-     CBC with Differential/Platelet ?-     CMP14+EGFR ?-     lisinopril (ZESTRIL) 10 MG tablet; Take 1 tablet (10 mg total) by mouth daily. ?-     Urinalysis ? ?Mixed hyperlipidemia ?-     PSA, total and free ? ?Annual physical exam ?-     CBC with Differential/Platelet ?-     CMP14+EGFR ?-     Lipid panel ?-     PSA, total and free ?-     Urinalysis ?-     VITAMIN D 25 Hydroxy (Vit-D Deficiency, Fractures) ? ?Hyperlipidemia, unspecified hyperlipidemia type ?-     Lipid panel ? ?Bipolar I disorder ([facility]CC) ?-     QUEtiapine (SEROQUEL) 100 MG tablet; TAKE 1 TABLET BY MOUTH EVERYDAY AT BEDTIME ? ?Insomnia, unspecified type ?-     QUEtiapine (SEROQUEL) 100 MG tablet; TAKE 1 TABLET BY MOUTH EVERYDAY AT BEDTIME ? ?Vitamin D deficiency ?-     VITAMIN D 25 Hydroxy (Vit-D Deficiency, Fractures) ? ?Other orders ?-     fenofibrate (TRICOR) 145 MG tablet; Take 1 tablet (145 mg total) by mouth daily. ? ? ? ? ? ? ?I have discontinued [name] "[name]"'s benzonatate. I have also changed his QUEtiapine and lisinopril. Additionally, I am having him maintain his cholecalciferol, pantoprazole, fluticasone, lithium, methocarbamol, brompheniramine-pseudoephedrine-DM, and fenofibrate. ? ?Allergies as of 07/28/2021   ? ?   Reactions  ? Lipitor [atorvastatin]   ? Niaspan [niacin Er]   ? ?  ? ?  ?Medication List  ?  ? ?  ? Accurate as of July 28, 2021  4:44 PM. If you have any questions, ask your nurse or doctor.  ?  ?  ? ?  ? ?STOP taking these medications   ? ?benzonatate 100 MG capsule ?Commonly known as: TESSALON ?Stopped by: Claretta Fraise, MD ?  ? ?  ? ?TAKE these medications   ? ?brompheniramine-pseudoephedrine-DM 30-2-10 MG/5ML syrup ?Take 5 mLs by mouth 4 (four) times  daily as needed. ?  ?cholecalciferol 1000 units tablet ?Commonly known as: VITAMIN D ?Take 1,000 Units by mouth daily. ?  ?fenofibrate 145 MG tablet ?Commonly known as: TRICOR ?Take 1 tablet (145 mg total) by mouth daily. ?  ?fluticasone 50 MCG/ACT nasal spray ?Commonly known as: FLONASE ?Place 2 sprays into both nostrils daily. ?  ?lisinopril 10 MG tablet ?Commonly known as: ZESTRIL ?Take 1 tablet (10 mg total)  by mouth daily. ?  ?lithium 300 MG tablet ?Take 2 tablets (600 mg total) by mouth daily. ?  ?methocarbamol 500 MG tablet ?Commonly known as: Robaxin ?Take 1 tablet (500 mg total) by mouth 4 (four) times daily. ?  ?pantoprazole 40 MG tablet ?Commonly known as: PROTONIX ?Take 40 mg by mouth daily. ?  ?QUEtiapine 100 MG tablet ?Commonly known as: SEROQUEL ?TAKE 1 TABLET BY MOUTH EVERYDAY AT BEDTIME ?What changed: medication strength ?Changed by: Claretta Fraise, MD ?  ? ?  ? ? ? ?Follow-up: No follow-ups on file. ? ?Claretta Fraise, M.D. ?

## 2021-07-29 LAB — CMP14+EGFR
ALT: 43 IU/L (ref 0–44)
AST: 43 IU/L — ABNORMAL HIGH (ref 0–40)
Albumin/Globulin Ratio: 2 (ref 1.2–2.2)
Albumin: 4.7 g/dL (ref 3.8–4.9)
Alkaline Phosphatase: 57 IU/L (ref 44–121)
BUN/Creatinine Ratio: 9 — ABNORMAL LOW (ref 10–24)
BUN: 16 mg/dL (ref 8–27)
Bilirubin Total: 0.3 mg/dL (ref 0.0–1.2)
CO2: 20 mmol/L (ref 20–29)
Calcium: 10.8 mg/dL — ABNORMAL HIGH (ref 8.6–10.2)
Chloride: 106 mmol/L (ref 96–106)
Creatinine, Ser: 1.73 mg/dL — ABNORMAL HIGH (ref 0.76–1.27)
Globulin, Total: 2.3 g/dL (ref 1.5–4.5)
Glucose: 79 mg/dL (ref 70–99)
Potassium: 4.9 mmol/L (ref 3.5–5.2)
Sodium: 139 mmol/L (ref 134–144)
Total Protein: 7 g/dL (ref 6.0–8.5)
eGFR: 45 mL/min/{1.73_m2} — ABNORMAL LOW (ref >59)

## 2021-07-29 LAB — URINALYSIS
Bilirubin, UA: NEGATIVE
Glucose, UA: NEGATIVE
Ketones, UA: NEGATIVE
Nitrite, UA: NEGATIVE
Protein,UA: NEGATIVE
Specific Gravity, UA: 1.01 (ref 1.005–1.030)
Urobilinogen, Ur: 0.2 mg/dL (ref 0.2–1.0)
pH, UA: 6 (ref 5.0–7.5)

## 2021-07-29 LAB — PSA, TOTAL AND FREE
PSA, Free Pct: 21.1 %
PSA, Free: 0.59 ng/mL
Prostate Specific Ag, Serum: 2.8 ng/mL (ref 0.0–4.0)

## 2021-07-29 LAB — LIPID PANEL
Chol/HDL Ratio: 5.2 ratio — ABNORMAL HIGH (ref 0.0–5.0)
Cholesterol, Total: 172 mg/dL (ref 100–199)
HDL: 33 mg/dL — ABNORMAL LOW (ref >39)
LDL Chol Calc (NIH): 105 mg/dL — ABNORMAL HIGH (ref 0–99)
Triglycerides: 195 mg/dL — ABNORMAL HIGH (ref 0–149)
VLDL Cholesterol Cal: 34 mg/dL (ref 5–40)

## 2021-07-29 LAB — CBC WITH DIFFERENTIAL/PLATELET
Basophils Absolute: 0.1 10*3/uL (ref 0.0–0.2)
Basos: 1 %
EOS (ABSOLUTE): 0.2 10*3/uL (ref 0.0–0.4)
Eos: 2 %
Hematocrit: 35 % — ABNORMAL LOW (ref 37.5–51.0)
Hemoglobin: 11.8 g/dL — ABNORMAL LOW (ref 13.0–17.7)
Immature Grans (Abs): 0 10*3/uL (ref 0.0–0.1)
Immature Granulocytes: 0 %
Lymphocytes Absolute: 2.3 10*3/uL (ref 0.7–3.1)
Lymphs: 23 %
MCH: 29.9 pg (ref 26.6–33.0)
MCHC: 33.7 g/dL (ref 31.5–35.7)
MCV: 89 fL (ref 79–97)
Monocytes Absolute: 0.6 10*3/uL (ref 0.1–0.9)
Monocytes: 6 %
Neutrophils Absolute: 6.9 10*3/uL (ref 1.4–7.0)
Neutrophils: 68 %
Platelets: 301 10*3/uL (ref 150–450)
RBC: 3.95 x10E6/uL — ABNORMAL LOW (ref 4.14–5.80)
RDW: 14 % (ref 11.6–15.4)
WBC: 10.1 10*3/uL (ref 3.4–10.8)

## 2021-07-29 LAB — VITAMIN D 25 HYDROXY (VIT D DEFICIENCY, FRACTURES): Vit D, 25-Hydroxy: 73.3 ng/mL (ref 30.0–100.0)

## 2021-08-04 ENCOUNTER — Encounter: Payer: Self-pay | Admitting: Emergency Medicine

## 2021-08-04 ENCOUNTER — Encounter: Payer: Self-pay | Admitting: *Deleted

## 2021-08-04 NOTE — Telephone Encounter (Signed)
Opened in error

## 2021-08-11 DIAGNOSIS — F439 Reaction to severe stress, unspecified: Secondary | ICD-10-CM | POA: Diagnosis not present

## 2021-08-23 DIAGNOSIS — M25571 Pain in right ankle and joints of right foot: Secondary | ICD-10-CM | POA: Diagnosis not present

## 2021-08-25 DIAGNOSIS — M25571 Pain in right ankle and joints of right foot: Secondary | ICD-10-CM | POA: Diagnosis not present

## 2021-09-01 ENCOUNTER — Ambulatory Visit: Payer: BC Managed Care – PPO | Admitting: Adult Health

## 2021-09-10 DIAGNOSIS — F439 Reaction to severe stress, unspecified: Secondary | ICD-10-CM | POA: Diagnosis not present

## 2021-09-28 ENCOUNTER — Ambulatory Visit: Payer: BC Managed Care – PPO | Admitting: Adult Health

## 2021-09-28 ENCOUNTER — Encounter: Payer: Self-pay | Admitting: Adult Health

## 2021-09-28 DIAGNOSIS — Z79899 Other long term (current) drug therapy: Secondary | ICD-10-CM | POA: Diagnosis not present

## 2021-09-28 DIAGNOSIS — G4700 Insomnia, unspecified: Secondary | ICD-10-CM

## 2021-09-28 DIAGNOSIS — F319 Bipolar disorder, unspecified: Secondary | ICD-10-CM | POA: Diagnosis not present

## 2021-09-28 NOTE — Progress Notes (Signed)
JUVENAL RENNEY [id number] [birth year] 61 y.o.  Subjective:   Patient ID:  Melvin Roberts is a 61 y.o. (DOB 01/07/1961) male.  Chief Complaint: No chief complaint on file.   HPI JAYD GASCHLER presents to the office today for follow-up of Bipolar Disorder, high risk medication use and insomnia.  Referred by PCP - Bipolar disorder medications.  Describes mood today as "ok". Pleasant. Mood symptoms - reporting some depression, anxiety, and irritability. Stating "I feel aggravated about a lot of things". Worried about changes in the work setting - concerns about safety. Ongoing concerns about son. He and wife doing well. Recently welcomed his 11th grandchild. Feels like current medications are helpful. Stable interest and motivation. Taking medications as prescribed and feels like it works well for him.  Energy levels stable. Active, has a regular exercise routine.  Enjoys some usual interests and activities. Married. Lives with wife. Has 5 children and 11 grandchildren. Spending time with family. Appetite adequate. Weight gain - 225 to 230 pounds. Sleeps well most nights. Feels tired. Averages 7 hours. Focus and concentration mostly stable. Completing tasks. Managing aspects of household. Works full time as an Electrical engineer. Denies SI or HI.  Denies AH or VH.  Reports hand cramps - usually tired and at the end of the day - hands shaking.   Previous medication trials:  Lithium, Seroquel    GAD-7    Flowsheet Row Office Visit from 07/28/2021 in [place] Visit from 07/15/2021 in [facility] Visit from 06/08/2021 in [place] Visit from 12/16/2020 in [place] Visit from 10/20/2020 in [facility]  Total GAD-7 Score 7 5 8 4 8       PHQ2-9    [place] Office Visit from 07/28/2021 in [place] [facility] Visit from 07/15/2021 in [place] Visit from 06/08/2021 in [facility] Visit from 12/16/2020 in [facility] [place] Office Visit from 10/20/2020 in [facility]  PHQ-2 Total Score 2 2 2 2 1   PHQ-9 Total Score 9 4 10 8 3       Flowsheet Row ED from 09/20/2020 in [place] No Risk        Review of Systems:  Review of Systems  Musculoskeletal:  Negative for gait problem.  Neurological:  Negative for tremors.  Psychiatric/Behavioral:         Please refer to HPI    Medications: I have reviewed the patient's current medications.  Current Outpatient Medications  Medication Sig Dispense Refill   brompheniramine-pseudoephedrine-DM 30-2-10 MG/5ML syrup Take 5 mLs by mouth 4 (four) times daily as needed. 120 mL 0   cholecalciferol (VITAMIN D) 1000 UNITS tablet Take 1,000 Units by mouth daily.     fenofibrate (TRICOR) 145 MG tablet Take 1 tablet (145 mg total) by mouth daily. 90 tablet 3   fluticasone (FLONASE) 50 MCG/ACT nasal spray Place 2 sprays into both nostrils daily. 16 g 6   lisinopril (ZESTRIL) 10 MG tablet Take 1 tablet (10 mg total) by mouth daily. 90 tablet 3   lithium 300 MG tablet Take 2 tablets (600 mg total) by mouth daily. 180 tablet 3   methocarbamol (ROBAXIN) 500 MG tablet Take 1 tablet (500 mg total) by mouth 4 (four) times daily. 60 tablet 0   pantoprazole (PROTONIX) 40 MG tablet Take 40 mg by mouth  daily.     QUEtiapine (SEROQUEL) 100 MG tablet TAKE 1 TABLET BY MOUTH EVERYDAY AT BEDTIME 90 tablet 1   No current facility-administered medications for this visit.    Medication Side Effects: None  Allergies:  Allergies  Allergen Reactions   Lipitor [Atorvastatin]    Niaspan Durene Cal Er]     Past Medical History:  Diagnosis Date   Bipolar 1 disorder ([place])    Hyperlipidemia    Hypertension     Past Medical  History, Surgical history, Social history, and Family history were reviewed and updated as appropriate.   Please see review of systems for further details on the patient's review from today.   Objective:   Physical Exam:  There were no vitals taken for this visit.  Physical Exam Constitutional:      General: He is not in acute distress. Musculoskeletal:        General: No deformity.  Neurological:     Mental Status: He is alert and oriented to person, place, and time.     Coordination: Coordination normal.  Psychiatric:        Attention and Perception: Attention and perception normal. He does not perceive auditory or visual hallucinations.        Mood and Affect: Mood normal. Mood is not anxious or depressed. Affect is not labile, blunt, angry or inappropriate.        Speech: Speech normal.        Behavior: Behavior normal.        Thought Content: Thought content normal. Thought content is not paranoid or delusional. Thought content does not include homicidal or suicidal ideation. Thought content does not include homicidal or suicidal plan.        Cognition and Memory: Cognition and memory normal.        Judgment: Judgment normal.     Comments: Insight intact     Lab Review:     Component Value Date/Time   NA 139 07/28/2021 1658   K 4.9 07/28/2021 1658   CL 106 07/28/2021 1658   CO2 20 07/28/2021 1658   GLUCOSE 79 07/28/2021 1658   GLUCOSE 102 (H) 06/07/2021 0926   BUN 16 07/28/2021 1658   CREATININE 1.73 (H) 07/28/2021 1658   CALCIUM 10.8 (H) 07/28/2021 1658   PROT 7.0 07/28/2021 1658   ALBUMIN 4.7 07/28/2021 1658   AST 43 (H) 07/28/2021 1658   ALT 43 07/28/2021 1658   ALKPHOS 57 07/28/2021 1658   BILITOT 0.3 07/28/2021 1658   GFRNONAA 53 (L) 06/07/2021 0926   GFRAA 61 05/29/2020 0854       Component Value Date/Time   WBC 10.1 07/28/2021 1658   WBC 8.9 03/24/2014 1026   RBC 3.95 (L) 07/28/2021 1658   RBC 4.4 (A) 03/24/2014 1026   HGB 11.8 (L) 07/28/2021 1658    HCT 35.0 (L) 07/28/2021 1658   PLT 301 07/28/2021 1658   MCV 89 07/28/2021 1658   MCH 29.9 07/28/2021 1658   MCH 28.8 03/24/2014 1026   MCHC 33.7 07/28/2021 1658   MCHC 32.5 03/24/2014 1026   RDW 14.0 07/28/2021 1658   LYMPHSABS 2.3 07/28/2021 1658   EOSABS 0.2 07/28/2021 1658   BASOSABS 0.1 07/28/2021 1658    Lithium Lvl  Date Value Ref Range Status  06/07/2021 1.07 0.60 - 1.20 mmol/L Final    Comment:    Performed at [facility], [address], [place], [place] [address]     No results found for: "PHENYTOIN", "PHENOBARB", "VALPROATE", "CBMZ"   .  res Assessment: Plan:     Plan:  PDMP reviewed  Seroquel 50mg  at hs Lithium 300mg  BID  Labs: TSH, Lithium, and BMP  RTC 3 months  Time spent with patient was 25 minutes. Greater than 50% of face to face time with patient was spent on counseling and coordination of care.   Reviewed signs and symptoms of lithium toxicity, including; slurred speech, worsening tremors, nausea, vomiting, diarrhea, confusion and ataxia. Patient instructed to notify clinic if experiencing the symptoms or go to the ER if the symptoms are severe or occurring outside of office hours. Discussed potential drug interactions. Discussed hydration and risk of toxicity. Discussed avoiding diuretics or Motrin and advised patient to notify office if started on either. Discussed routine blood monitoring For renal and thyroid functioning due to potential long-term effects associated with lithium use.   Discussed potential metabolic side effects associated with atypical antipsychotics, as well as potential risk for movement side effects. Advised pt to contact office if movement side effects occur.    Patient advised to contact office with any questions, adverse effects, or acute worsening in signs and symptoms.   Diagnoses and all orders for this visit:  High risk medication use -     TSH -     Lithium level -     Basic metabolic panel  Insomnia,  unspecified type  Bipolar I disorder ([place])     Please see After Visit Summary for patient specific instructions.  Future Appointments  Date Time Provider [place]  01/27/2022  4:10 PM Claretta Fraise, MD [facility] None    Orders Placed This Encounter  Procedures   TSH   Lithium level   Basic metabolic panel    -------------------------------

## 2021-09-29 ENCOUNTER — Other Ambulatory Visit: Payer: BC Managed Care – PPO

## 2021-09-29 DIAGNOSIS — Z79899 Other long term (current) drug therapy: Secondary | ICD-10-CM | POA: Diagnosis not present

## 2021-09-30 LAB — BASIC METABOLIC PANEL
BUN/Creatinine Ratio: 12 (ref 10–24)
BUN: 23 mg/dL (ref 8–27)
CO2: 19 mmol/L — ABNORMAL LOW (ref 20–29)
Calcium: 8.8 mg/dL (ref 8.6–10.2)
Chloride: 104 mmol/L (ref 96–106)
Creatinine, Ser: 1.99 mg/dL — ABNORMAL HIGH (ref 0.76–1.27)
Glucose: 87 mg/dL (ref 70–99)
Potassium: 5.1 mmol/L (ref 3.5–5.2)
Sodium: 138 mmol/L (ref 134–144)
eGFR: 38 mL/min/{1.73_m2} — ABNORMAL LOW (ref >59)

## 2021-09-30 LAB — LITHIUM LEVEL: Lithium Lvl: 0.9 mmol/L (ref 0.5–1.2)

## 2021-09-30 LAB — TSH: TSH: 1.75 u[IU]/mL (ref 0.450–4.500)

## 2021-09-30 NOTE — Progress Notes (Signed)
Can we let him know to follow up with PCP soon regarding the elevated Creatnine level.

## 2021-09-30 NOTE — Progress Notes (Signed)
Left pt a detailed message with information. Will follow up to make sure he received it.

## 2021-10-01 ENCOUNTER — Telehealth: Payer: Self-pay | Admitting: Family Medicine

## 2021-10-01 NOTE — Telephone Encounter (Signed)
Pt would like you to look over labs from 09/29/21 ordered by mozingo

## 2021-10-03 NOTE — Telephone Encounter (Signed)
Ust contact the provider wh ordered the tests.

## 2021-10-04 NOTE — Telephone Encounter (Signed)
CALLED PATIENT, NO ANSWER, LEFT MESSAGE TO RETURN CALL 

## 2021-10-06 NOTE — Telephone Encounter (Signed)
Called patient, no answer 

## 2021-10-15 DIAGNOSIS — F439 Reaction to severe stress, unspecified: Secondary | ICD-10-CM | POA: Diagnosis not present

## 2021-11-12 DIAGNOSIS — F439 Reaction to severe stress, unspecified: Secondary | ICD-10-CM | POA: Diagnosis not present

## 2021-11-22 ENCOUNTER — Encounter: Payer: Self-pay | Admitting: Adult Health

## 2021-11-22 ENCOUNTER — Ambulatory Visit: Payer: BC Managed Care – PPO | Admitting: Adult Health

## 2021-11-22 DIAGNOSIS — G4700 Insomnia, unspecified: Secondary | ICD-10-CM | POA: Diagnosis not present

## 2021-11-22 DIAGNOSIS — Z79899 Other long term (current) drug therapy: Secondary | ICD-10-CM | POA: Diagnosis not present

## 2021-11-22 DIAGNOSIS — F319 Bipolar disorder, unspecified: Secondary | ICD-10-CM

## 2021-11-22 NOTE — Progress Notes (Signed)
Melvin Roberts [id number] Mar 31, 1961 61 y.o.  Subjective:   Patient ID:  Melvin Roberts is a 61 y.o. (DOB May 01, 1960) male.  Chief Complaint: No chief complaint on file.   HPI MECHEL SCHUTTER presents to the office today for follow-up of Bipolar Disorder, high risk medication use and insomnia.  Referred by PCP - Bipolar disorder medications.  Describes mood today as "ok". Pleasant. Mood symptoms - reporting some depression, anxiety, and irritability - situational. Mood is consistent. Stating "I'm doing alright". Reports situational stressors related to son - mental health issues. Stating "I never know what I'm dealing with". He and wife doing well. Feels like current medications are helpful. Stable interest and motivation. Taking medications as prescribed. Energy levels stable. Active, has a regular exercise routine.  Enjoys some usual interests and activities. Married. Lives with wife. Has 5 children and 11 grandchildren. Spending time with family. Appetite adequate. Weight gain - 225 to 235 pounds. Sleeps well most nights. Feels tired. Averages 7 hours. Focus and concentration mostly stable. Completing tasks. Managing aspects of household. 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  Denies AH or VH.  Previous medication trials:  Lithium, Seroquel   GAD-7    Flowsheet Row Office Visit from 07/28/2021 in [facility] Office Visit from 07/15/2021 in [facility] Office Visit from 06/08/2021 in [facility] Office Visit from 12/16/2020 in [facility] Office Visit from 10/20/2020 in [facility]  Total GAD-7 Score 7 5 8 4 8       PHQ2-9    Flowsheet Row Office Visit from 07/28/2021 in [facility] Office Visit from 07/15/2021 in [facility] Office Visit from 06/08/2021 in [facility]  Office Visit from 12/16/2020 in [facility] Office Visit from 10/20/2020 in 12/21/2020 Family Medicine  PHQ-2 Total Score 2 2 2 2 1   PHQ-9 Total Score 9 4 10 8 3       Flowsheet Row ED from 09/20/2020 in [facility]  C-SSRS RISK CATEGORY No Risk        Review of Systems:  Review of Systems  Musculoskeletal:  Negative for gait problem.  Neurological:  Negative for tremors.  Psychiatric/Behavioral:         Please refer to HPI    Medications: I have reviewed the patient's current medications.  Current Outpatient Medications  Medication Sig Dispense Refill   brompheniramine-pseudoephedrine-DM 30-2-10 MG/5ML syrup Take 5 mLs by mouth 4 (four) times daily as needed. 120 mL 0   cholecalciferol (VITAMIN D) 1000 UNITS tablet Take 1,000 Units by mouth daily.     fenofibrate (TRICOR) 145 MG tablet Take 1 tablet (145 mg total) by mouth daily. 90 tablet 3   fluticasone (FLONASE) 50 MCG/ACT nasal spray Place 2 sprays into both nostrils daily. 16 g 6   lisinopril (ZESTRIL) 10 MG tablet Take 1 tablet (10 mg total) by mouth daily. 90 tablet 3   lithium 300 MG tablet Take 2 tablets (600 mg total) by mouth daily. 180 tablet 3   methocarbamol (ROBAXIN) 500 MG tablet Take 1 tablet (500 mg total) by mouth 4 (four) times daily. 60 tablet 0   pantoprazole (PROTONIX) 40 MG tablet Take 40 mg by mouth daily.     QUEtiapine (SEROQUEL) 100 MG tablet TAKE 1 TABLET BY MOUTH EVERYDAY AT BEDTIME 90 tablet 1   No current facility-administered medications for this visit.  Medication Side Effects: None  Allergies:  Allergies  Allergen Reactions   Lipitor [Atorvastatin]    Niaspan Odette Fraction Er]     Past Medical History:  Diagnosis Date   Bipolar 1 disorder ([facility])    Hyperlipidemia    Hypertension     Past Medical History, Surgical history, Social history, and Family history were reviewed and updated as appropriate.   Please see review of systems  for further details on the patient's review from today.   Objective:   Physical Exam:  There were no vitals taken for this visit.  Physical Exam Constitutional:      General: He is not in acute distress. Musculoskeletal:        General: No deformity.  Neurological:     Mental Status: He is alert and oriented to person, place, and time.     Coordination: Coordination normal.  Psychiatric:        Attention and Perception: Attention and perception normal. He does not perceive auditory or visual hallucinations.        Mood and Affect: Mood normal. Mood is not anxious or depressed. Affect is not labile, blunt, angry or inappropriate.        Speech: Speech normal.        Behavior: Behavior normal.        Thought Content: Thought content normal. Thought content is not paranoid or delusional. Thought content does not include homicidal or suicidal ideation. Thought content does not include homicidal or suicidal plan.        Cognition and Memory: Cognition and memory normal.        Judgment: Judgment normal.     Comments: Insight intact     Lab Review:     Component Value Date/Time   NA 138 09/29/2021 1538   K 5.1 09/29/2021 1538   CL 104 09/29/2021 1538   CO2 19 (L) 09/29/2021 1538   GLUCOSE 87 09/29/2021 1538   GLUCOSE 102 (H) 06/07/2021 0926   BUN 23 09/29/2021 1538   CREATININE 1.99 (H) 09/29/2021 1538   CALCIUM 8.8 09/29/2021 1538   PROT 7.0 07/28/2021 1658   ALBUMIN 4.7 07/28/2021 1658   AST 43 (H) 07/28/2021 1658   ALT 43 07/28/2021 1658   ALKPHOS 57 07/28/2021 1658   BILITOT 0.3 07/28/2021 1658   GFRNONAA 53 (L) 06/07/2021 0926   GFRAA 61 05/29/2020 0854       Component Value Date/Time   WBC 10.1 07/28/2021 1658   WBC 8.9 03/24/2014 1026   RBC 3.95 (L) 07/28/2021 1658   RBC 4.4 (A) 03/24/2014 1026   HGB 11.8 (L) 07/28/2021 1658   HCT 35.0 (L) 07/28/2021 1658   PLT 301 07/28/2021 1658   MCV 89 07/28/2021 1658   MCH 29.9 07/28/2021 1658   MCH 28.8 03/24/2014  1026   MCHC 33.7 07/28/2021 1658   MCHC 32.5 03/24/2014 1026   RDW 14.0 07/28/2021 1658   LYMPHSABS 2.3 07/28/2021 1658   EOSABS 0.2 07/28/2021 1658   BASOSABS 0.1 07/28/2021 1658    Lithium Lvl  Date Value Ref Range Status  09/29/2021 0.9 0.5 - 1.2 mmol/L Final    Comment:    A concentration of 0.5-0.8 mmol/L is advised for long-term use; concentrations of up to 1.2 mmol/L may be necessary during acute treatment.                                  Detection  Limit = 0.1                           <0.1 indicates None Detected      No results found for: "PHENYTOIN", "PHENOBARB", "VALPROATE", "CBMZ"   .res Assessment: Plan:    Plan:  PDMP reviewed  Seroquel 100mg  at hs Lithium 300mg  BID  Labs: TSH, Lithium, and BMP 09/28/2021  RTC 3 months  Time spent with patient was 25 minutes. Greater than 50% of face to face time with patient was spent on counseling and coordination of care.   Reviewed signs and symptoms of lithium toxicity, including; slurred speech, worsening tremors, nausea, vomiting, diarrhea, confusion and ataxia. Patient instructed to notify clinic if experiencing the symptoms or go to the ER if the symptoms are severe or occurring outside of office hours. Discussed potential drug interactions. Discussed hydration and risk of toxicity. Discussed avoiding diuretics or Motrin and advised patient to notify office if started on either. Discussed routine blood monitoring For renal and thyroid functioning due to potential long-term effects associated with lithium use.   Discussed potential metabolic side effects associated with atypical antipsychotics, as well as potential risk for movement side effects. Advised pt to contact office if movement side effects occur.    Patient advised to contact office with any questions, adverse effects, or acute worsening in signs and symptoms.  Diagnoses and all orders for this visit:  Insomnia, unspecified type  Bipolar I disorder  (H[facility])  High risk medication use     Please see After Visit Summary for patient specific instructions.  Future Appointments  Date Time Provider Department Center  01/27/2022  4:10 PM 09/30/2021, MD [facility] None    No orders of the defined types were placed in this encounter.   -------------------------------

## 2021-12-24 DIAGNOSIS — F439 Reaction to severe stress, unspecified: Secondary | ICD-10-CM | POA: Diagnosis not present

## 2021-12-27 ENCOUNTER — Ambulatory Visit: Payer: BC Managed Care – PPO | Admitting: Nurse Practitioner

## 2021-12-27 ENCOUNTER — Encounter: Payer: Self-pay | Admitting: Nurse Practitioner

## 2021-12-27 VITALS — BP 108/75 | HR 65 | Temp 98.7°F | Ht 71.0 in | Wt 229.0 lb

## 2021-12-27 DIAGNOSIS — J320 Chronic maxillary sinusitis: Secondary | ICD-10-CM | POA: Diagnosis not present

## 2021-12-27 MED ORDER — AMOXICILLIN-POT CLAVULANATE 875-125 MG PO TABS: 1.0000 | ORAL_TABLET | Freq: Two times a day (BID) | ORAL | 0 refills | Status: DC

## 2021-12-27 NOTE — Patient Instructions (Signed)

## 2021-12-27 NOTE — Progress Notes (Signed)
Acute Office Visit  Subjective:     Patient ID: Melvin Roberts, male    DOB: 1960/09/03, 61 y.o.   MRN: [id number]  Chief Complaint  Patient presents with   Sinus Problem   Sinusitis    About 4 days now     Sinusitis This is a chronic problem. The problem is unchanged. There has been no fever. The pain is moderate. Associated symptoms include congestion, headaches and sinus pressure. Pertinent negatives include no chills, coughing, shortness of breath, sneezing or sore throat. Past treatments include acetaminophen. The treatment provided mild relief.     Review of Systems  Constitutional: Negative.  Negative for chills and fever.  HENT:  Positive for congestion and sinus pressure. Negative for sneezing and sore throat.   Respiratory:  Negative for cough and shortness of breath.   Cardiovascular: Negative.   Skin: Negative.  Negative for itching and rash.  Neurological:  Positive for headaches.  All other systems reviewed and are negative.       Objective:    BP 108/75   Pulse 65   Temp 98.7 F (37.1 C)   Ht 5\' 11"  (1.803 m)   Wt 229 lb (103.9 kg)   SpO2 95%   BMI 31.94 kg/m  BP Readings from Last 3 Encounters:  12/27/21 108/75  07/28/21 115/68  07/15/21 107/68      Physical Exam Vitals and nursing note reviewed.  Constitutional:      Appearance: Normal appearance.  HENT:     Head: Normocephalic.     Right Ear: External ear normal.     Left Ear: External ear normal.     Nose: Congestion present.     Mouth/Throat:     Lips: Pink.     Mouth: Mucous membranes are moist.     Pharynx: Oropharynx is clear.  Eyes:     Conjunctiva/sclera: Conjunctivae normal.  Cardiovascular:     Rate and Rhythm: Normal rate and regular rhythm.     Pulses: Normal pulses.     Heart sounds: Normal heart sounds.  Pulmonary:     Effort: Pulmonary effort is normal.     Breath sounds: Normal breath sounds.  Abdominal:     General: Bowel sounds are normal.   Musculoskeletal:     Cervical back: Normal range of motion.  Skin:    Findings: No erythema or rash.  Neurological:     Mental Status: He is alert.     No results found for any visits on 12/27/21.      Assessment & Plan:  Patient presents with chronic maxillary sinusitis.  Advised patient to Take all meds as prescribed - Use a cool mist humidifier  -Use saline nose sprays frequently -Force fluids -For fever or aches or pains- take Tylenol or ibuprofen. -Amoxicillin 875-125 mg tablet by mouth -If symptoms do not improve, she may need to be COVID tested to rule this out Follow up with worsening unresolved symptoms  Problem List Items Addressed This Visit       Respiratory   Sinusitis, chronic - Primary   Relevant Medications   amoxicillin-clavulanate (AUGMENTIN) 875-125 MG tablet    Meds ordered this encounter  Medications   amoxicillin-clavulanate (AUGMENTIN) 875-125 MG tablet    Sig: Take 1 tablet by mouth 2 (two) times daily.    Dispense:  14 tablet    Refill:  0    Order Specific Question:   Supervising Provider    Answer:   02/26/22 [phone]  Return if symptoms worsen or fail to improve.  Ivy Lynn, NP

## 2021-12-29 ENCOUNTER — Ambulatory Visit: Payer: BC Managed Care – PPO | Admitting: Family Medicine

## 2021-12-29 ENCOUNTER — Encounter: Payer: Self-pay | Admitting: Family Medicine

## 2021-12-29 VITALS — BP 124/78 | HR 63 | Temp 97.9°F | Resp 20 | Ht 71.0 in | Wt 230.0 lb

## 2021-12-29 DIAGNOSIS — J0110 Acute frontal sinusitis, unspecified: Secondary | ICD-10-CM | POA: Diagnosis not present

## 2021-12-29 NOTE — Patient Instructions (Signed)

## 2021-12-29 NOTE — Progress Notes (Signed)
Subjective: CC:URI PCP: Mechele Claude, MD [id number] Melvin Roberts is a 61 y.o. male presenting to clinic today for:  1. URI Patient was started on oral antibiotics, Augmentin, for presumed bacterial sinus infection on 12/27/2021.  He notes that symptoms really have not gotten any better but they also have not gotten any worse.  He reports sinus congestion, frontal headache and some mild drainage.  Denies any purulence, fevers, shortness of breath or wheezing.  Has not tested for COVID-19 but knows that there was a couple of folks at work recently that did test positive.  He did not realize that this was becoming an issue again in [facility].  He has Flonase at home but has not been utilizing this.   ROS: Per HPI  Allergies  Allergen Reactions   Lipitor [Atorvastatin]    Niaspan [Niacin Er]    Past Medical History:  Diagnosis Date   Bipolar 1 disorder (HCC)    Hyperlipidemia    Hypertension     Current Outpatient Medications:    amoxicillin-clavulanate (AUGMENTIN) 875-125 MG tablet, Take 1 tablet by mouth 2 (two) times daily., Disp: 14 tablet, Rfl: 0   brompheniramine-pseudoephedrine-DM 30-2-10 MG/5ML syrup, Take 5 mLs by mouth 4 (four) times daily as needed., Disp: 120 mL, Rfl: 0   cholecalciferol (VITAMIN Melvin) 1000 UNITS tablet, Take 1,000 Units by mouth daily., Disp: , Rfl:    fenofibrate (TRICOR) 145 MG tablet, Take 1 tablet (145 mg total) by mouth daily., Disp: 90 tablet, Rfl: 3   fluticasone (FLONASE) 50 MCG/ACT nasal spray, Place 2 sprays into both nostrils daily., Disp: 16 g, Rfl: 6   lisinopril (ZESTRIL) 10 MG tablet, Take 1 tablet (10 mg total) by mouth daily., Disp: 90 tablet, Rfl: 3   lithium 300 MG tablet, Take 2 tablets (600 mg total) by mouth daily., Disp: 180 tablet, Rfl: 3   methocarbamol (ROBAXIN) 500 MG tablet, Take 1 tablet (500 mg total) by mouth 4 (four) times daily., Disp: 60 tablet, Rfl: 0   pantoprazole (PROTONIX) 40 MG tablet, Take 40 mg by mouth  daily., Disp: , Rfl:    QUEtiapine (SEROQUEL) 100 MG tablet, TAKE 1 TABLET BY MOUTH EVERYDAY AT BEDTIME, Disp: 90 tablet, Rfl: 1 Social History   Socioeconomic History   Marital status: Married    Spouse name: Not on file   Number of children: Not on file   Years of education: Not on file   Highest education level: Not on file  Occupational History   Not on file  Tobacco Use   Smoking status: Never   Smokeless tobacco: Never  Vaping Use   Vaping Use: Never used  Substance and Sexual Activity   Alcohol use: No   Drug use: No   Sexual activity: Not on file  Other Topics Concern   Not on file  Social History Narrative   Not on file   Social Determinants of Health   Financial Resource Strain: Not on file  Food Insecurity: Not on file  Transportation Needs: Not on file  Physical Activity: Not on file  Stress: Not on file  Social Connections: Not on file  Intimate Partner Violence: Not on file   Family History  Problem Relation Age of Onset   Cancer Mother        breast   Cancer Father        lug   Cancer Maternal Grandfather    Cancer Paternal Grandfather    Colon cancer Neg Hx  Objective: Office vital signs reviewed. BP 124/78   Pulse 63   Temp 97.9 F (36.6 C) (Oral)   Resp 20   Ht 5\' 11"  (1.803 m)   Wt 230 lb (104.3 kg)   SpO2 97%   BMI 32.08 kg/m   Physical Examination:  General: Awake, alert, well nourished, No acute distress HEENT: Normal    Neck: No masses palpated. No lymphadenopathy    Ears: Tympanic membranes intact, normal light reflex, no erythema, no bulging    Eyes: PERRLA, extraocular membranes intact, sclera wihte    Nose: nasal turbinates moist, clear nasal discharge    Throat: moist mucus membranes, no erythema, no tonsillar exudate.  Airway is patent Cardio: regular rate and rhythm, S1S2 heard, no murmurs appreciated Pulm: clear to auscultation bilaterally, no wheezes, rhonchi or rales; normal work of breathing on room air     Assessment/ Plan: 61 y.o. male   Acute non-recurrent frontal sinusitis - Plan: COVID-19, Flu A+B and RSV  Suspect he may have a viral mediated process.  We will look for COVID given recent rise in our 67.  Certainly would recommend prescribing Molnupiravir or Paxlovid if he is positive given known hypertension with hyperlipidemia.  He will be within the appropriate timeframe assuming that the results are back in the next 48 hours.  I have written him a note to isolate at home for the next couple of days until the result is back.  Home care instructions were reviewed.  Use Flonase.  Sinus rinses recommended.  Okay to continue the antibiotic but I am not really convinced that he has a bacterial sinus infection at this time  Orders Placed This Encounter  Procedures   COVID-19, Flu A+B and RSV    Order Specific Question:   Previously tested for COVID-19    Answer:   Yes    Order Specific Question:   Resident in a congregate (group) care setting    Answer:   No    Order Specific Question:   Is the patient student?    Answer:   No    Order Specific Question:   Employed in healthcare setting    Answer:   No    Order Specific Question:   Has patient completed COVID vaccination(s) (2 doses of Pfizer/Moderna 1 dose of [place])    Answer:   Unknown   No orders of the defined types were placed in this encounter.    [employer], DO [facility] [phone]

## 2021-12-30 LAB — COVID-19, FLU A+B AND RSV
Influenza A, NAA: NOT DETECTED
Influenza B, NAA: NOT DETECTED
RSV, NAA: NOT DETECTED
SARS-CoV-2, NAA: NOT DETECTED

## 2022-01-19 ENCOUNTER — Other Ambulatory Visit: Payer: Self-pay | Admitting: Family Medicine

## 2022-01-19 DIAGNOSIS — F319 Bipolar disorder, unspecified: Secondary | ICD-10-CM

## 2022-01-19 DIAGNOSIS — G4700 Insomnia, unspecified: Secondary | ICD-10-CM

## 2022-01-19 NOTE — Telephone Encounter (Signed)
Last office visit 07/28/21 Upcoming appointment 01/27/22 Last refill 07/28/21, #90, 1 refill

## 2022-01-20 DIAGNOSIS — M7541 Impingement syndrome of right shoulder: Secondary | ICD-10-CM | POA: Diagnosis not present

## 2022-01-26 ENCOUNTER — Ambulatory Visit: Payer: BC Managed Care – PPO | Admitting: Family Medicine

## 2022-01-27 ENCOUNTER — Ambulatory Visit: Payer: BC Managed Care – PPO | Admitting: Family Medicine

## 2022-01-27 ENCOUNTER — Encounter: Payer: Self-pay | Admitting: Family Medicine

## 2022-01-27 VITALS — BP 129/88 | HR 70 | Temp 98.1°F | Ht 71.0 in | Wt 234.0 lb

## 2022-01-27 DIAGNOSIS — I10 Essential (primary) hypertension: Secondary | ICD-10-CM | POA: Diagnosis not present

## 2022-01-27 DIAGNOSIS — E782 Mixed hyperlipidemia: Secondary | ICD-10-CM

## 2022-01-27 DIAGNOSIS — F3171 Bipolar disorder, in partial remission, most recent episode hypomanic: Secondary | ICD-10-CM | POA: Diagnosis not present

## 2022-01-27 DIAGNOSIS — K219 Gastro-esophageal reflux disease without esophagitis: Secondary | ICD-10-CM

## 2022-01-27 MED ORDER — PREDNISONE 10 MG PO TABS: ORAL_TABLET | ORAL | 0 refills | Status: DC

## 2022-01-27 MED ORDER — LEVOFLOXACIN 500 MG PO TABS: 500.0000 mg | ORAL_TABLET | Freq: Every day | ORAL | 0 refills | Status: DC

## 2022-01-27 NOTE — Progress Notes (Signed)
Subjective:  Patient ID: Melvin Roberts, male    DOB: Jan 10, 1961  Age: 61 y.o. MRN: [id number]  CC: Medical Management of Chronic Issues   HPI COLLINS KERBY presents for  presents for  follow-up of hypertension. Patient has no history of headache chest pain or shortness of breath or recent cough. Patient also denies symptoms of TIA such as focal numbness or weakness. Patient denies side effects from medication. States taking it regularly.  Patient in for follow-up of GERD. Currently asymptomatic taking  PPI daily. There is no chest pain or heartburn. No hematemesis and no melena. No dysphagia or choking. Onset is remote. Progression is stable. Complicating factors, none.   in for follow-up of elevated cholesterol. Doing well without complaints on current medication. Denies side effects of statin including myalgia and arthralgia and nausea. Currently no chest pain, shortness of breath or other cardiovascular related symptoms noted.        01/27/2022    2:41 PM 01/27/2022    2:23 PM 12/29/2021    2:30 PM  Depression screen PHQ 2/9  Decreased Interest 0 0 0  Down, Depressed, Hopeless 0 0 0  PHQ - 2 Score 0 0 0  Altered sleeping 0  0  Tired, decreased energy 0  0  Change in appetite 1  0  Feeling bad or failure about yourself  0  0  Trouble concentrating 0  0  Moving slowly or fidgety/restless 0  0  Suicidal thoughts 0  0  PHQ-9 Score 1  0  Difficult doing work/chores Not difficult at all      History [name] has a past medical history of Bipolar 1 disorder ([place]), Hyperlipidemia, and Hypertension.   He has a past surgical history that includes Back surgery (11/17/95); Hernia repair (2009); Eye surgery; and Upper gastrointestinal endoscopy (x2).   His family history includes Cancer in his father, maternal grandfather, mother, and paternal grandfather.He reports that he has never smoked. He has never used smokeless tobacco. He reports that he does not drink alcohol and  does not use drugs.    ROS Review of Systems  Constitutional:  Negative for fever.  Respiratory:  Negative for shortness of breath.   Cardiovascular:  Negative for chest pain.  Musculoskeletal:  Negative for arthralgias.  Skin:  Negative for rash.    Objective:  BP 129/88   Pulse 70   Temp 98.1 F (36.7 C)   Ht '5\' 11"'  (1.803 m)   Wt 234 lb (106.1 kg)   SpO2 96%   BMI 32.64 kg/m   BP Readings from Last 3 Encounters:  01/27/22 129/88  12/29/21 124/78  12/27/21 108/75    Wt Readings from Last 3 Encounters:  01/27/22 234 lb (106.1 kg)  12/29/21 230 lb (104.3 kg)  12/27/21 229 lb (103.9 kg)     Physical Exam Vitals reviewed.  Constitutional:      Appearance: He is well-developed.  HENT:     Head: Normocephalic and atraumatic.     Right Ear: External ear normal.     Left Ear: External ear normal.     Mouth/Throat:     Pharynx: No oropharyngeal exudate or posterior oropharyngeal erythema.  Eyes:     Pupils: Pupils are equal, round, and reactive to light.  Cardiovascular:     Rate and Rhythm: Normal rate and regular rhythm.     Heart sounds: No murmur heard. Pulmonary:     Effort: No respiratory distress.     Breath sounds:  Normal breath sounds.  Musculoskeletal:     Cervical back: Normal range of motion and neck supple.  Neurological:     Mental Status: He is alert and oriented to person, place, and time.       Assessment & Plan:   Rishabh was seen today for medical management of chronic issues.  Diagnoses and all orders for this visit:  Primary hypertension  Bipolar disorder, in partial remission, most recent episode hypomanic ([place]) -     CBC with Differential/Platelet -     CMP14+EGFR  Gastroesophageal reflux disease without esophagitis -     CBC with Differential/Platelet -     CMP14+EGFR  Mixed hyperlipidemia -     CBC with Differential/Platelet -     CMP14+EGFR  Other orders -     levofloxacin (LEVAQUIN) 500 MG tablet; Take 1 tablet  (500 mg total) by mouth daily. For 10 days -     predniSONE (DELTASONE) 10 MG tablet; Take 5 daily for 2 days followed by 4,3,2 and 1 for 2 days each.       I am having [name] "[name]" start on levofloxacin and predniSONE. I am also having him maintain his cholecalciferol, pantoprazole, fluticasone, lithium, methocarbamol, brompheniramine-pseudoephedrine-DM, fenofibrate, lisinopril, amoxicillin-clavulanate, and QUEtiapine.  Allergies as of 01/27/2022       Reactions   Lipitor [atorvastatin]    Niaspan [niacin Er]         Medication List        Accurate as of January 27, 2022 11:59 PM. If you have any questions, ask your nurse or doctor.          amoxicillin-clavulanate 875-125 MG tablet Commonly known as: AUGMENTIN Take 1 tablet by mouth 2 (two) times daily.   brompheniramine-pseudoephedrine-DM 30-2-10 MG/5ML syrup Take 5 mLs by mouth 4 (four) times daily as needed.   cholecalciferol 1000 units tablet Commonly known as: VITAMIN D Take 1,000 Units by mouth daily.   fenofibrate 145 MG tablet Commonly known as: TRICOR Take 1 tablet (145 mg total) by mouth daily.   fluticasone 50 MCG/ACT nasal spray Commonly known as: FLONASE Place 2 sprays into both nostrils daily.   levofloxacin 500 MG tablet Commonly known as: LEVAQUIN Take 1 tablet (500 mg total) by mouth daily. For 10 days Started by: Claretta Fraise, MD   lisinopril 10 MG tablet Commonly known as: ZESTRIL Take 1 tablet (10 mg total) by mouth daily.   lithium 300 MG tablet Take 2 tablets (600 mg total) by mouth daily.   methocarbamol 500 MG tablet Commonly known as: Robaxin Take 1 tablet (500 mg total) by mouth 4 (four) times daily.   pantoprazole 40 MG tablet Commonly known as: PROTONIX Take 40 mg by mouth daily.   predniSONE 10 MG tablet Commonly known as: DELTASONE Take 5 daily for 2 days followed by 4,3,2 and 1 for 2 days each. Started by: Claretta Fraise, MD   QUEtiapine 100 MG  tablet Commonly known as: SEROQUEL TAKE 1 TABLET BY MOUTH EVERYDAY AT BEDTIME         Follow-up: Return in about 6 months (around 07/29/2022).  Claretta Fraise, M.D.

## 2022-01-28 DIAGNOSIS — F439 Reaction to severe stress, unspecified: Secondary | ICD-10-CM | POA: Diagnosis not present

## 2022-01-28 LAB — CMP14+EGFR
ALT: 32 IU/L (ref 0–44)
AST: 28 IU/L (ref 0–40)
Albumin/Globulin Ratio: 2 (ref 1.2–2.2)
Albumin: 4.7 g/dL (ref 3.8–4.9)
Alkaline Phosphatase: 75 IU/L (ref 44–121)
BUN/Creatinine Ratio: 23 (ref 10–24)
BUN: 32 mg/dL — ABNORMAL HIGH (ref 8–27)
Bilirubin Total: 0.2 mg/dL (ref 0.0–1.2)
CO2: 17 mmol/L — ABNORMAL LOW (ref 20–29)
Calcium: 10.4 mg/dL — ABNORMAL HIGH (ref 8.6–10.2)
Chloride: 103 mmol/L (ref 96–106)
Creatinine, Ser: 1.42 mg/dL — ABNORMAL HIGH (ref 0.76–1.27)
Globulin, Total: 2.3 g/dL (ref 1.5–4.5)
Glucose: 78 mg/dL (ref 70–99)
Potassium: 5.1 mmol/L (ref 3.5–5.2)
Sodium: 135 mmol/L (ref 134–144)
Total Protein: 7 g/dL (ref 6.0–8.5)
eGFR: 57 mL/min/{1.73_m2} — ABNORMAL LOW (ref >59)

## 2022-01-28 LAB — CBC WITH DIFFERENTIAL/PLATELET
Basophils Absolute: 0 10*3/uL (ref 0.0–0.2)
Basos: 0 %
EOS (ABSOLUTE): 0.1 10*3/uL (ref 0.0–0.4)
Eos: 1 %
Hematocrit: 37.3 % — ABNORMAL LOW (ref 37.5–51.0)
Hemoglobin: 12.8 g/dL — ABNORMAL LOW (ref 13.0–17.7)
Immature Grans (Abs): 0.1 10*3/uL (ref 0.0–0.1)
Immature Granulocytes: 1 %
Lymphocytes Absolute: 2.2 10*3/uL (ref 0.7–3.1)
Lymphs: 15 %
MCH: 31.2 pg (ref 26.6–33.0)
MCHC: 34.3 g/dL (ref 31.5–35.7)
MCV: 91 fL (ref 79–97)
Monocytes Absolute: 0.8 10*3/uL (ref 0.1–0.9)
Monocytes: 6 %
Neutrophils Absolute: 11.5 10*3/uL — ABNORMAL HIGH (ref 1.4–7.0)
Neutrophils: 77 %
Platelets: 303 10*3/uL (ref 150–450)
RBC: 4.1 x10E6/uL — ABNORMAL LOW (ref 4.14–5.80)
RDW: 14.3 % (ref 11.6–15.4)
WBC: 14.7 10*3/uL — ABNORMAL HIGH (ref 3.4–10.8)

## 2022-01-29 ENCOUNTER — Encounter: Payer: Self-pay | Admitting: Family Medicine

## 2022-02-10 ENCOUNTER — Ambulatory Visit: Payer: BC Managed Care – PPO | Admitting: Family Medicine

## 2022-02-11 ENCOUNTER — Ambulatory Visit: Payer: BC Managed Care – PPO | Admitting: Family

## 2022-02-14 ENCOUNTER — Encounter: Payer: Self-pay | Admitting: Family Medicine

## 2022-02-14 ENCOUNTER — Other Ambulatory Visit: Payer: Self-pay | Admitting: Nurse Practitioner

## 2022-02-14 DIAGNOSIS — M549 Dorsalgia, unspecified: Secondary | ICD-10-CM

## 2022-02-21 ENCOUNTER — Ambulatory Visit: Payer: BC Managed Care – PPO | Admitting: Adult Health

## 2022-02-23 ENCOUNTER — Ambulatory Visit: Payer: BC Managed Care – PPO | Admitting: Family Medicine

## 2022-02-23 ENCOUNTER — Encounter: Payer: Self-pay | Admitting: Family Medicine

## 2022-02-23 VITALS — BP 126/74 | HR 81 | Temp 98.0°F | Wt 226.2 lb

## 2022-02-23 DIAGNOSIS — R059 Cough, unspecified: Secondary | ICD-10-CM | POA: Diagnosis not present

## 2022-02-23 DIAGNOSIS — R0981 Nasal congestion: Secondary | ICD-10-CM | POA: Diagnosis not present

## 2022-02-23 DIAGNOSIS — J329 Chronic sinusitis, unspecified: Secondary | ICD-10-CM

## 2022-02-23 LAB — VERITOR FLU A/B WAIVED
Influenza A: NEGATIVE
Influenza B: NEGATIVE

## 2022-02-23 LAB — RSV AG, IMMUNOCHR, WAIVED: RSV Ag, Immunochr, Waived: NEGATIVE

## 2022-02-23 MED ORDER — METHYLPREDNISOLONE ACETATE 40 MG/ML IJ SUSP
40.0000 mg | Freq: Once | INTRAMUSCULAR | Status: AC
  Administered 2022-02-23: 40 mg via INTRAMUSCULAR

## 2022-02-23 NOTE — Patient Instructions (Signed)
Sinus Pain  Sinus pain happens when your sinuses get swollen or blocked (clogged). Sinuses are spaces behind the bones of your face and forehead. You may feel pain or pressure in your face, forehead, ears, or upper teeth. Sinus pain can be mild or very bad. What are the causes? Sinus pain can result from conditions that affect your sinuses. Common causes include: Colds. Sinus infections. Allergies. What are the signs or symptoms? The main symptom of this condition is pain or pressure in your face, forehead, ears, or upper teeth. People who have sinus pain often have other symptoms, such as: Stuffed up or runny nose. Fever. Not being able to smell. Headache. Weather changes can make your symptoms worse. How is this treated? Treatment for this condition depends on the cause. Sinus pain caused by: A sinus infection may be treated with antibiotic medicine. A stuffy nose may be helped by rinsing out the nose and sinuses with a salt water solution. Allergies may be helped by allergy medicines and nasal sprays. Surgery may be needed in some cases if other treatments do not help. Follow these instructions at home: General instructions If told: Apply a warm, moist washcloth to your face. This can help to lessen pain. Use a nasal salt water wash. Follow the directions on the bottle or box. Hydrate and humidify Drink enough water to keep your pee (urine) pale yellow. Use a humidifier if your home is dry. Breathe in steam for 10-15 minutes, 3-4 times a day or as told by your doctor. You can do this in the bathroom while a hot shower is running. Try not to spend time in cool or dry air. Medicines 

## 2022-02-23 NOTE — Progress Notes (Signed)
I have separately seen and examined the patient. I have discussed the findings and exam with student Dr Mariel Kansky and agree with the below note.  My changes/additions are outlined in BLUE.    S: Patient reports that he has been having some facial pain and this radiates down into the teeth sometimes.  He points to the right side as the main area of interest.  He has had recurrent sinusitis in the past and has been currently treated with Augmentin but notes that symptoms or not improving as expected.  He reports no fevers.  No purulent drainage.  No known sick contacts.  Has history of recurrent sinusitis in the past and saw ENT several years ago and was told that perhaps he needs some type of surgical intervention but he did not want to proceed  O: Vitals:   02/23/22 0824  BP: 126/74  Pulse: 81  Temp: 98 F (36.7 C)  SpO2: 95%    BP 126/74   Pulse 81   Temp 98 F (36.7 C)   Wt 226 lb 3.2 oz (102.6 kg)   SpO2 95%   BMI 31.55 kg/m  General appearance: alert, cooperative, appears stated age, and no distress Head: Normocephalic, without obvious abnormality, atraumatic Eyes:  Sclera white. Ears: normal TM's and external ear canals both ears Nose:  Deviated nasal septum to the right.  No purulence from nares.  Turbinates are moist Throat: lips, mucosa, and tongue normal; teeth and gums normal Neck: no adenopathy   A/P:  Recurrent sinusitis - Plan: RSV Ag, Immunochr, Waived, Novel Coronavirus, NAA ([facility]), Veritor Flu A/B Waived, methylPREDNISolone acetate (DEPO-MEDROL) injection 40 mg, Ambulatory referral to ENT  Currently on antibiotics.  I am not totally convinced that this is in fact infection as his physical exam was fairly unremarkable.  He has nasal septum deviation to the right but no overt purulence appreciated on exam nor any tenderness to palpation to the sinuses on exam.  Rapid strep, flu negative.  COVID test sent and pending.  Referral back to ENT for further evaluation  placed.  Depo-Medrol administered in efforts to alleviate some of the discomfort he was experiencing  [name] M. Nadine Counts, DO [facility]   -------------------------------------------------------------------------------------------------------------------------------------------------------------------------------------      Subjective: CC:URI PCP: Mechele Claude, MD FGH:[id number] Melvin Roberts is a 61 y.o. male presenting to clinic today for:  Sinus pain Patient has been having nasal congestion for the past week. Reports significant shooting pain in his teeth that has developed over the past two days. Went to the dentist who started him on oral Augmentin on 02/22/2022. Symptoms have not improved significantly with antibiotics. He reports sinus congestion, feelings of sinus pressure, and mild non-purulent drainage. Denies any purulence, fevers, shortness of breath or wheezing. Has not tested for COVID-19. No high, persistent fevers, periorbital edema, vision changes, or altered mental status.   Of note, patient saw ENT in 2016 (Dr. Hollace Hayward with [facility]). CT report from that time notes no evidence of obstruction to sinus drainage. Given ongoing recurrent sinus problems, patient says that he is interested in following up with ENT.   ROS: Per HPI  Allergies  Allergen Reactions   Lipitor [Atorvastatin]    Niaspan [Niacin Er]    Past Medical History:  Diagnosis Date   Bipolar 1 disorder (HCC)    Hyperlipidemia    Hypertension     Current Outpatient Medications:    amoxicillin-clavulanate (AUGMENTIN) 875-125 MG tablet, Take 1 tablet by mouth 2 (two) times daily.,  Disp: 14 tablet, Rfl: 0   cholecalciferol (VITAMIN Melvin) 1000 UNITS tablet, Take 1,000 Units by mouth daily., Disp: , Rfl:    fenofibrate (TRICOR) 145 MG tablet, Take 1 tablet (145 mg total) by mouth daily., Disp: 90 tablet, Rfl: 3   fluticasone (FLONASE) 50 MCG/ACT nasal spray, Place 2 sprays into both  nostrils daily., Disp: 16 g, Rfl: 6   levofloxacin (LEVAQUIN) 500 MG tablet, Take 1 tablet (500 mg total) by mouth daily. For 10 days, Disp: 10 tablet, Rfl: 0   lisinopril (ZESTRIL) 10 MG tablet, Take 1 tablet (10 mg total) by mouth daily., Disp: 90 tablet, Rfl: 3   lithium 300 MG tablet, Take 2 tablets (600 mg total) by mouth daily., Disp: 180 tablet, Rfl: 3   methocarbamol (ROBAXIN) 500 MG tablet, TAKE 1 TABLET BY MOUTH 4 TIMES DAILY., Disp: 120 tablet, Rfl: 1   pantoprazole (PROTONIX) 40 MG tablet, Take 40 mg by mouth daily., Disp: , Rfl:    QUEtiapine (SEROQUEL) 100 MG tablet, TAKE 1 TABLET BY MOUTH EVERYDAY AT BEDTIME, Disp: 90 tablet, Rfl: 1 Social History   Socioeconomic History   Marital status: Married    Spouse name: Not on file   Number of children: Not on file   Years of education: Not on file   Highest education level: Not on file  Occupational History   Not on file  Tobacco Use   Smoking status: Never   Smokeless tobacco: Never  Vaping Use   Vaping Use: Never used  Substance and Sexual Activity   Alcohol use: No   Drug use: No   Sexual activity: Not on file  Other Topics Concern   Not on file  Social History Narrative   Not on file   Social Determinants of Health   Financial Resource Strain: Not on file  Food Insecurity: Not on file  Transportation Needs: Not on file  Physical Activity: Not on file  Stress: Not on file  Social Connections: Not on file  Intimate Partner Violence: Not on file   Family History  Problem Relation Age of Onset   Cancer Mother        breast   Cancer Father        lug   Cancer Maternal Grandfather    Cancer Paternal Grandfather    Colon cancer Neg Hx     Objective: Office vital signs reviewed. BP 126/74   Pulse 81   Temp 98 F (36.7 C)   Wt 226 lb 3.2 oz (102.6 kg)   SpO2 95%   BMI 31.55 kg/m   Physical Examination:  General: Awake, alert, well nourished, No acute distress HEENT: Normal    Neck: No masses  palpated. No lymphadenopathy    Ears: Tympanic membranes intact, normal light reflex, no erythema, no bulging    Eyes: PERRLA, extraocular membranes intact, sclera white    Nose: nasal turbinates moist, clear nasal discharge    Throat: moist mucus membranes, no erythema, no tonsillar exudate.  Sinuses: Non-edematous, non-erythematous, patient reports feelings of sinus fullness with percussion Cardio: regular rate and rhythm, S1S2 heard, no murmurs appreciated Pulm: clear to auscultation bilaterally, no wheezes, rhonchi or rales; normal work of breathing on room air GI: soft, non-tender, non-distended, bowel sounds present x4, no hepatomegaly, no splenomegaly, no masses Extremities: warm, well perfused, No edema, cyanosis or clubbing; +1 pulses bilaterally Skin: dry; intact; no rashes or lesions  Assessment/ Plan: 62 y.o. male   Most likely acute viral rhinosinusitis given gradual  onset of symptoms. Given that his symptoms have not improved with antibiotic treatment, as well as the absence of fevers, purulence, and sinus tenderness, I not think that this has progressed to acute bacterial sinusitis. Additionally, physical exam showed no erythema or edema over cheekbones or periorbital area, cheek tenderness, or tenderness with percussion. Most likely dental pain is secondary to sinus fullness putting pressure on branches of the maxillary nerve. I think steroids are appropriate to decrease inflammation to allow drainage of the sinuses. Patient reports that ENT previously told him that he has blockage of sinus passageway. Patient has already been given a prescription for antibiotics and I have instructed him that he can continue this therapy.   Additionally, I with place referral to ENT so that the patient can be further evaluated. He reports that he previously declined surgical intervention given 50/50 odds of improved symptoms.   Orders Placed This Encounter  Procedures   RSV Ag, Immunochr, Waived    Novel Coronavirus, NAA ([facility])    Order Specific Question:   Previously tested for COVID-19    Answer:   Yes    Order Specific Question:   Resident in a congregate (group) care setting    Answer:   No    Order Specific Question:   Is the patient student?    Answer:   No    Order Specific Question:   Employed in healthcare setting    Answer:   No    Order Specific Question:   Has patient completed COVID vaccination(s) (2 doses of Pfizer/Moderna 1 dose of [employer])    Answer:   No    Order Specific Question:   Release to patient    Answer:   Immediate [1]   Veritor Flu A/B Waived    Order Specific Question:   Source    Answer:   nasal   Ambulatory referral to ENT    Referral Priority:   Routine    Referral Type:   Consultation    Referral Reason:   Specialty Services Required    Requested Specialty:   Otolaryngology    Number of Visits Requested:   1   Meds ordered this encounter  Medications   methylPREDNISolone acetate (DEPO-MEDROL) injection 40 mg   Deri Fuelling, MS3

## 2022-02-24 ENCOUNTER — Encounter: Payer: Self-pay | Admitting: Adult Health

## 2022-02-24 ENCOUNTER — Ambulatory Visit: Payer: BC Managed Care – PPO | Admitting: Adult Health

## 2022-02-24 DIAGNOSIS — Z79899 Other long term (current) drug therapy: Secondary | ICD-10-CM

## 2022-02-24 DIAGNOSIS — G4700 Insomnia, unspecified: Secondary | ICD-10-CM | POA: Diagnosis not present

## 2022-02-24 DIAGNOSIS — F319 Bipolar disorder, unspecified: Secondary | ICD-10-CM

## 2022-02-24 LAB — NOVEL CORONAVIRUS, NAA: SARS-CoV-2, NAA: NOT DETECTED

## 2022-02-24 NOTE — Progress Notes (Signed)
JOSHAU CODE [id number] [birth year] 61 y.o.  Subjective:   Patient ID:  Melvin Roberts is a 61 y.o. (DOB 05/01/1960) male.  Chief Complaint: No chief complaint on file.   HPI Melvin Roberts presents to the office today for follow-up of Bipolar Disorder, high risk medication use and insomnia.  Referred by PCP - Bipolar disorder medications.  Describes mood today as "ok". Pleasant. Mood symptoms - reporting some depression and irritability. Reports increased anxiety - situational stressors. Mood is consistent. Stating "I'm doing alright" - "just stressed about my son" - mental health issues - recently hospitalized. He and wife doing well. Feels like current medications are helpful. Stable interest and motivation. Taking medications as prescribed. Energy levels stable. Active, has a regular exercise routine.  Enjoys some usual interests and activities. Married. Lives with wife. Has 5 children and 11 grandchildren. Spending time with family. Appetite adequate. Weight gain - 225 to 235 pounds. Sleeps well most nights. Averages 7 hours. Focus and concentration mostly stable. Completing tasks. Managing aspects of household. 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  Denies AH or VH.  Previous medication trials:  Lithium, Seroquel   GAD-7    Flowsheet Row Office Visit from 02/23/2022 in [facility] Office Visit from 01/27/2022 in [facility] Office Visit from 12/29/2021 in [facility] Office Visit from 12/27/2021 in [facility] Office Visit from 07/28/2021 in [facility]  Total GAD-7 Score 0 3 0 4 7      PHQ2-9    Flowsheet Row Office Visit from 02/23/2022 in [facility] Office Visit from 01/27/2022 in [facility] Office Visit from 12/29/2021 in [facility] Office Visit from  12/27/2021 in [facility] Office Visit from 07/28/2021 in [facility]  PHQ-2 Total Score 0 0 0 2 2  PHQ-9 Total Score 0 1 0 8 9      Flowsheet Row ED from 09/20/2020 in [facility]  C-SSRS RISK CATEGORY No Risk        Review of Systems:  Review of Systems  Musculoskeletal:  Negative for gait problem.  Neurological:  Negative for tremors.  Psychiatric/Behavioral:         Please refer to HPI    Medications: I have reviewed the patient's current medications.  Current Outpatient Medications  Medication Sig Dispense Refill   amoxicillin-clavulanate (AUGMENTIN) 875-125 MG tablet Take 1 tablet by mouth 2 (two) times daily. 14 tablet 0   cholecalciferol (VITAMIN D) 1000 UNITS tablet Take 1,000 Units by mouth daily.     fenofibrate (TRICOR) 145 MG tablet Take 1 tablet (145 mg total) by mouth daily. 90 tablet 3   fluticasone (FLONASE) 50 MCG/ACT nasal spray Place 2 sprays into both nostrils daily. 16 g 6   levofloxacin (LEVAQUIN) 500 MG tablet Take 1 tablet (500 mg total) by mouth daily. For 10 days 10 tablet 0   lisinopril (ZESTRIL) 10 MG tablet Take 1 tablet (10 mg total) by mouth daily. 90 tablet 3   lithium 300 MG tablet Take 2 tablets (600 mg total) by mouth daily. 180 tablet 3   methocarbamol (ROBAXIN) 500 MG tablet TAKE 1 TABLET BY MOUTH 4 TIMES DAILY. 120 tablet 1   pantoprazole (PROTONIX) 40 MG tablet Take 40 mg by mouth daily.     QUEtiapine (SEROQUEL) 100 MG tablet TAKE 1 TABLET BY MOUTH EVERYDAY AT  BEDTIME 90 tablet 1   No current facility-administered medications for this visit.    Medication Side Effects: None  Allergies:  Allergies  Allergen Reactions   Lipitor [Atorvastatin]    Niaspan Odette Fraction Er]     Past Medical History:  Diagnosis Date   Bipolar 1 disorder (HCC)    Hyperlipidemia    Hypertension     Past Medical History, Surgical history, Social history, and Family history were  reviewed and updated as appropriate.   Please see review of systems for further details on the patient's review from today.   Objective:   Physical Exam:  There were no vitals taken for this visit.  Physical Exam Constitutional:      General: He is not in acute distress. Musculoskeletal:        General: No deformity.  Neurological:     Mental Status: He is alert and oriented to person, place, and time.     Coordination: Coordination normal.  Psychiatric:        Attention and Perception: Attention and perception normal. He does not perceive auditory or visual hallucinations.        Mood and Affect: Mood normal. Mood is not anxious or depressed. Affect is not labile, blunt, angry or inappropriate.        Speech: Speech normal.        Behavior: Behavior normal.        Thought Content: Thought content normal. Thought content is not paranoid or delusional. Thought content does not include homicidal or suicidal ideation. Thought content does not include homicidal or suicidal plan.        Cognition and Memory: Cognition and memory normal.        Judgment: Judgment normal.     Comments: Insight intact     Lab Review:     Component Value Date/Time   NA 135 01/27/2022 1529   K 5.1 01/27/2022 1529   CL 103 01/27/2022 1529   CO2 17 (L) 01/27/2022 1529   GLUCOSE 78 01/27/2022 1529   GLUCOSE 102 (H) 06/07/2021 0926   BUN 32 (H) 01/27/2022 1529   CREATININE 1.42 (H) 01/27/2022 1529   CALCIUM 10.4 (H) 01/27/2022 1529   PROT 7.0 01/27/2022 1529   ALBUMIN 4.7 01/27/2022 1529   AST 28 01/27/2022 1529   ALT 32 01/27/2022 1529   ALKPHOS 75 01/27/2022 1529   BILITOT 0.2 01/27/2022 1529   GFRNONAA 53 (L) 06/07/2021 0926   GFRAA 61 05/29/2020 0854       Component Value Date/Time   WBC 14.7 (H) 01/27/2022 1529   WBC 8.9 03/24/2014 1026   RBC 4.10 (L) 01/27/2022 1529   RBC 4.4 (A) 03/24/2014 1026   HGB 12.8 (L) 01/27/2022 1529   HCT 37.3 (L) 01/27/2022 1529   PLT 303 01/27/2022 1529    MCV 91 01/27/2022 1529   MCH 31.2 01/27/2022 1529   MCH 28.8 03/24/2014 1026   MCHC 34.3 01/27/2022 1529   MCHC 32.5 03/24/2014 1026   RDW 14.3 01/27/2022 1529   LYMPHSABS 2.2 01/27/2022 1529   EOSABS 0.1 01/27/2022 1529   BASOSABS 0.0 01/27/2022 1529    Lithium Lvl  Date Value Ref Range Status  09/29/2021 0.9 0.5 - 1.2 mmol/L Final    Comment:    A concentration of 0.5-0.8 mmol/L is advised for long-term use; concentrations of up to 1.2 mmol/L may be necessary during acute treatment.  Detection Limit = 0.1                           <0.1 indicates None Detected      No results found for: "PHENYTOIN", "PHENOBARB", "VALPROATE", "CBMZ"   .res Assessment: Plan:    Plan:  PDMP reviewed  Seroquel 100mg  at hs Lithium 300mg  BID  Labs: TSH, Lithium, and BMP 09/29/2021 - lab slips given.  RTC 3 months  Time spent with patient was 25 minutes. Greater than 50% of face to face time with patient was spent on counseling and coordination of care.   Reviewed signs and symptoms of lithium toxicity, including; slurred speech, worsening tremors, nausea, vomiting, diarrhea, confusion and ataxia. Patient instructed to notify clinic if experiencing the symptoms or go to the ER if the symptoms are severe or occurring outside of office hours. Discussed potential drug interactions. Discussed hydration and risk of toxicity. Discussed avoiding diuretics or Motrin and advised patient to notify office if started on either. Discussed routine blood monitoring For renal and thyroid functioning due to potential long-term effects associated with lithium use.   Discussed potential metabolic side effects associated with atypical antipsychotics, as well as potential risk for movement side effects. Advised pt to contact office if movement side effects occur.    Patient advised to contact office with any questions, adverse effects, or acute worsening in signs and  symptoms.  Diagnoses and all orders for this visit:  Bipolar I disorder (HCC)  Insomnia, unspecified type  High risk medication use -     Lithium level -     TSH -     Basic metabolic panel     Please see After Visit Summary for patient specific instructions.  Future Appointments  Date Time Provider Department Center  05/27/2022  4:00 PM Kharson Rasmusson, 10/01/2021, NP [facility] None  08/01/2022  3:25 PM Thereasa Solo, MD [facility] None    Orders Placed This Encounter  Procedures   Lithium level   TSH   Basic metabolic panel    -------------------------------

## 2022-02-26 ENCOUNTER — Encounter (HOSPITAL_BASED_OUTPATIENT_CLINIC_OR_DEPARTMENT_OTHER): Payer: Self-pay | Admitting: Emergency Medicine

## 2022-02-26 ENCOUNTER — Emergency Department (HOSPITAL_BASED_OUTPATIENT_CLINIC_OR_DEPARTMENT_OTHER)
Admission: EM | Admit: 2022-02-26 | Discharge: 2022-02-26 | Disposition: A | Discharge status: Home / Self Care | Payer: BC Managed Care – PPO | Attending: Emergency Medicine | Admitting: Emergency Medicine

## 2022-02-26 ENCOUNTER — Emergency Department (HOSPITAL_BASED_OUTPATIENT_CLINIC_OR_DEPARTMENT_OTHER): Payer: BC Managed Care – PPO

## 2022-02-26 ENCOUNTER — Other Ambulatory Visit: Payer: Self-pay

## 2022-02-26 DIAGNOSIS — R059 Cough, unspecified: Secondary | ICD-10-CM | POA: Diagnosis not present

## 2022-02-26 DIAGNOSIS — J0111 Acute recurrent frontal sinusitis: Secondary | ICD-10-CM | POA: Insufficient documentation

## 2022-02-26 DIAGNOSIS — Z79899 Other long term (current) drug therapy: Secondary | ICD-10-CM | POA: Insufficient documentation

## 2022-02-26 DIAGNOSIS — I10 Essential (primary) hypertension: Secondary | ICD-10-CM | POA: Diagnosis not present

## 2022-02-26 DIAGNOSIS — Z20822 Contact with and (suspected) exposure to covid-19: Secondary | ICD-10-CM | POA: Diagnosis not present

## 2022-02-26 DIAGNOSIS — R051 Acute cough: Secondary | ICD-10-CM

## 2022-02-26 DIAGNOSIS — J0110 Acute frontal sinusitis, unspecified: Secondary | ICD-10-CM | POA: Diagnosis not present

## 2022-02-26 LAB — BRAIN NATRIURETIC PEPTIDE: B Natriuretic Peptide: 9.5 pg/mL (ref 0.0–100.0)

## 2022-02-26 LAB — CBC WITH DIFFERENTIAL/PLATELET
Abs Immature Granulocytes: 0.04 10*3/uL (ref 0.00–0.07)
Basophils Absolute: 0 10*3/uL (ref 0.0–0.1)
Basophils Relative: 0 %
Eosinophils Absolute: 0.1 10*3/uL (ref 0.0–0.5)
Eosinophils Relative: 1 %
HCT: 37.3 % — ABNORMAL LOW (ref 39.0–52.0)
Hemoglobin: 12.4 g/dL — ABNORMAL LOW (ref 13.0–17.0)
Immature Granulocytes: 0 %
Lymphocytes Relative: 22 %
Lymphs Abs: 2 10*3/uL (ref 0.7–4.0)
MCH: 31 pg (ref 26.0–34.0)
MCHC: 33.2 g/dL (ref 30.0–36.0)
MCV: 93.3 fL (ref 80.0–100.0)
Monocytes Absolute: 0.6 10*3/uL (ref 0.1–1.0)
Monocytes Relative: 6 %
Neutro Abs: 6.4 10*3/uL (ref 1.7–7.7)
Neutrophils Relative %: 71 %
Platelets: 241 10*3/uL (ref 150–400)
RBC: 4 MIL/uL — ABNORMAL LOW (ref 4.22–5.81)
RDW: 15.7 % — ABNORMAL HIGH (ref 11.5–15.5)
WBC: 9.1 10*3/uL (ref 4.0–10.5)
nRBC: 0 % (ref 0.0–0.2)

## 2022-02-26 LAB — COMPREHENSIVE METABOLIC PANEL
ALT: 58 U/L — ABNORMAL HIGH (ref 0–44)
AST: 48 U/L — ABNORMAL HIGH (ref 15–41)
Albumin: 4 g/dL (ref 3.5–5.0)
Alkaline Phosphatase: 63 U/L (ref 38–126)
Anion gap: 7 (ref 5–15)
BUN: 21 mg/dL (ref 8–23)
CO2: 22 mmol/L (ref 22–32)
Calcium: 9.8 mg/dL (ref 8.9–10.3)
Chloride: 108 mmol/L (ref 98–111)
Creatinine, Ser: 1.3 mg/dL — ABNORMAL HIGH (ref 0.61–1.24)
GFR, Estimated: >60 mL/min (ref >60)
Glucose, Bld: 117 mg/dL — ABNORMAL HIGH (ref 70–99)
Potassium: 4.4 mmol/L (ref 3.5–5.1)
Sodium: 137 mmol/L (ref 135–145)
Total Bilirubin: 0.4 mg/dL (ref 0.3–1.2)
Total Protein: 7.1 g/dL (ref 6.5–8.1)

## 2022-02-26 LAB — RESP PANEL BY RT-PCR (FLU A&B, COVID) ARPGX2
Influenza A by PCR: NEGATIVE
Influenza B by PCR: NEGATIVE
SARS Coronavirus 2 by RT PCR: NEGATIVE

## 2022-02-26 MED ORDER — FLUTICASONE PROPIONATE 50 MCG/ACT NA SUSP: 2.0000 | Freq: Every day | NASAL | 0 refills | Status: DC

## 2022-02-26 MED ORDER — LEVOFLOXACIN 250 MG PO TABS: 750.0000 mg | ORAL_TABLET | Freq: Every day | ORAL | 0 refills | Status: AC

## 2022-02-26 NOTE — ED Triage Notes (Signed)
Pt POV c/o ongoing congestion, productive cough, head pressure x3 weeks.   Recently prescribed amoxicillin, taken as prescribed, no relief.   Denies fevers. Known work sick contacts.   Pt also reports intermittent BL hand tremors, reports PCP concerned about lithium levels.

## 2022-02-26 NOTE — ED Provider Notes (Signed)
[facility] Provider Note   CSN: [id number] Arrival date & time: 02/26/22  2017     History {Add pertinent medical, surgical, social history, OB history to HPI:1} Chief Complaint  Patient presents with   Cough    Melvin Roberts is a 61 y.o. male.  HPI     61yo male with history of bipolar, htn, hlpd   Sinus headaches, cough, congestion Feels like root canals everywhere Always get congestion in the fall Has seen ENT, had a scan, Dr. Hollace Hayward [facility] 2016 Hx of chronic maxillary sinusitis/allergic rhinitis PCP had referred to ENT again On second round of abx right now but not helping Not on any nasal sprays or anything else.  Usually abx help but not this time No fevers Horrible cold with sinus headache Coworker had been sick for 2 months Cough for 3 weeks No CP, dyspnea only with coughing spell No leg pain or swelling No hx of dvt/PE, no lnog trips/recent surgeries Headache sinus, no other headaches No nausea/vomiting  Denies numbness, weakness, difficulty talking or walking, visual changes or facial droop.   Hands shake sometimes. Began about 6 mos ago. When working on something, worsens with stress       Past Medical History:  Diagnosis Date   Bipolar 1 disorder (HCC)    Hyperlipidemia    Hypertension      Home Medications Prior to Admission medications   Medication Sig Start Date End Date Taking? Authorizing Provider  amoxicillin-clavulanate (AUGMENTIN) 875-125 MG tablet Take 1 tablet by mouth 2 (two) times daily. 12/27/21   Daryll Drown, NP  cholecalciferol (VITAMIN D) 1000 UNITS tablet Take 1,000 Units by mouth daily.    [provider]  fenofibrate (TRICOR) 145 MG tablet Take 1 tablet (145 mg total) by mouth daily. 07/28/21   Mechele Claude, MD  fluticasone (FLONASE) 50 MCG/ACT nasal spray Place 2 sprays into both nostrils daily. 08/17/20   Daryll Drown, NP  levofloxacin (LEVAQUIN) 500 MG tablet Take 1  tablet (500 mg total) by mouth daily. For 10 days 01/27/22   Mechele Claude, MD  lisinopril (ZESTRIL) 10 MG tablet Take 1 tablet (10 mg total) by mouth daily. 07/28/21   Mechele Claude, MD  lithium 300 MG tablet Take 2 tablets (600 mg total) by mouth daily. 06/04/21   Mozingo, Thereasa Solo, NP  methocarbamol (ROBAXIN) 500 MG tablet TAKE 1 TABLET BY MOUTH 4 TIMES DAILY. 02/15/22   Mechele Claude, MD  pantoprazole (PROTONIX) 40 MG tablet Take 40 mg by mouth daily.    [provider]  QUEtiapine (SEROQUEL) 100 MG tablet TAKE 1 TABLET BY MOUTH EVERYDAY AT BEDTIME 01/19/22   Mechele Claude, MD      Allergies    Lipitor [atorvastatin] and Niaspan [niacin er]    Review of Systems   Review of Systems  Physical Exam Updated Vital Signs BP 138/88   Pulse 70   Temp 97.8 F (36.6 C) (Oral)   Resp 18   Ht 5' 10.5" (1.791 m)   Wt 102.1 kg   SpO2 96%   BMI 31.83 kg/m  Physical Exam  ED Results / Procedures / Treatments   Labs (all labs ordered are listed, but only abnormal results are displayed) Labs Reviewed  CBC WITH DIFFERENTIAL/PLATELET - Abnormal; Notable for the following components:      Result Value   RBC 4.00 (*)    Hemoglobin 12.4 (*)    HCT 37.3 (*)    RDW 15.7 (*)  All other components within normal limits  RESP PANEL BY RT-PCR (FLU A&B, COVID) ARPGX2  COMPREHENSIVE METABOLIC PANEL  LITHIUM LEVEL  TSH  T4, FREE  BRAIN NATRIURETIC PEPTIDE    EKG None  Radiology DG Chest 2 View  Result Date: 02/26/2022 CLINICAL DATA:  Productive cough, congestion. EXAM: CHEST - 2 VIEW COMPARISON:  09/20/2020. FINDINGS: The heart size and mediastinal contours are within normal limits. No consolidation, effusion, or pneumothorax. Degenerative changes are present in the thoracic spine. IMPRESSION: No active cardiopulmonary disease. Electronically Signed   By: Thornell Sartorius M.D.   On: 02/26/2022 22:02    Procedures Procedures  {Document cardiac monitor, telemetry assessment  procedure when appropriate:1}  Medications Ordered in ED Medications - No data to display  ED Course/ Medical Decision Making/ A&P                           Medical Decision Making Amount and/or Complexity of Data Reviewed Labs: ordered. Radiology: ordered.   ***  {Document critical care time when appropriate:1} {Document review of labs and clinical decision tools ie heart score, Chads2Vasc2 etc:1}  {Document your independent review of radiology images, and any outside records:1} {Document your discussion with family members, caretakers, and with consultants:1} {Document social determinants of health affecting pt's care:1} {Document your decision making why or why not admission, treatments were needed:1} Final Clinical Impression(s) / ED Diagnoses Final diagnoses:  None    Rx / DC Orders ED Discharge Orders     None

## 2022-02-27 LAB — LITHIUM LEVEL: Lithium Lvl: 0.68 mmol/L (ref 0.60–1.20)

## 2022-02-27 LAB — TSH: TSH: 1.563 u[IU]/mL (ref 0.350–4.500)

## 2022-02-27 LAB — T4, FREE: Free T4: 0.78 ng/dL (ref 0.61–1.12)

## 2022-03-18 DIAGNOSIS — F439 Reaction to severe stress, unspecified: Secondary | ICD-10-CM | POA: Diagnosis not present

## 2022-04-05 ENCOUNTER — Encounter: Payer: Self-pay | Admitting: Family Medicine

## 2022-04-05 ENCOUNTER — Ambulatory Visit: Payer: BC Managed Care – PPO | Admitting: Family Medicine

## 2022-04-05 DIAGNOSIS — J069 Acute upper respiratory infection, unspecified: Secondary | ICD-10-CM | POA: Diagnosis not present

## 2022-04-05 MED ORDER — FLUTICASONE PROPIONATE 50 MCG/ACT NA SUSP: 2.0000 | Freq: Every day | NASAL | 0 refills | Status: DC

## 2022-04-05 NOTE — Progress Notes (Signed)
Virtual Visit via telephone Note Due to COVID-19 pandemic this visit was conducted virtually. This visit type was conducted due to national recommendations for restrictions regarding the COVID-19 Pandemic (e.g. social distancing, sheltering in place) in an effort to limit this patient's exposure and mitigate transmission in our community. All issues noted in this document were discussed and addressed.  A physical exam was not performed with this format.   I connected with Melvin Roberts on 04/05/2022 at 0820 by telephone and verified that I am speaking with the correct person using two identifiers. Melvin Roberts is currently located at home and patient is currently with them during visit. The provider, Kari Baars, FNP is located in their office at time of visit.  I discussed the limitations, risks, security and privacy concerns of performing an evaluation and management service by virtual visit and the availability of in person appointments. I also discussed with the patient that there may be a patient responsible charge related to this service. The patient expressed understanding and agreed to proceed.  Subjective:  Patient ID: Melvin Roberts, male    DOB: 01/17/1961, 61 y.o.   MRN: [id number]  Chief Complaint:  URI   HPI: Melvin Roberts is a 61 y.o. male presenting on 04/05/2022 for URI   Pt reports ongoing but improving congestion and cough. Was seen in the ED and treated with Levaquin for sinusitis on 02/26/2022. States he got much better and then the cough returned. States it is not as bad but is still present. Denies fever. Bought Mucinex but has not started taking this. No fever, chills, weakness, confusion, fatigue, shortness of breath, or chest pain.   URI  This is a new problem. The current episode started in the past 7 days. The problem has been gradually improving. There has been no fever. Associated symptoms include congestion and coughing. Pertinent  negatives include no abdominal pain, chest pain, diarrhea, dysuria, ear pain, headaches, joint pain, joint swelling, nausea, neck pain, plugged ear sensation, rash, rhinorrhea, sinus pain, sneezing, sore throat, swollen glands, vomiting or wheezing. He has tried increased fluids for the symptoms. The treatment provided moderate relief.     Relevant past medical, surgical, family, and social history reviewed and updated as indicated.  Allergies and medications reviewed and updated.   Past Medical History:  Diagnosis Date   Bipolar 1 disorder (HCC)    Hyperlipidemia    Hypertension     Past Surgical History:  Procedure Laterality Date   BACK SURGERY  11/17/95   Dr. Shelle Iron   EYE SURGERY     HERNIA REPAIR  2009   umbilical   UPPER GASTROINTESTINAL ENDOSCOPY  x2   w/ esophageal dilation    Social History   Socioeconomic History   Marital status: Married    Spouse name: Not on file   Number of children: Not on file   Years of education: Not on file   Highest education level: Not on file  Occupational History   Not on file  Tobacco Use   Smoking status: Never   Smokeless tobacco: Never  Vaping Use   Vaping Use: Never used  Substance and Sexual Activity   Alcohol use: No   Drug use: No   Sexual activity: Not on file  Other Topics Concern   Not on file  Social History Narrative   Not on file   Social Determinants of Health   Financial Resource Strain: Not on file  Food Insecurity: Not on file  Transportation Needs: Not on file  Physical Activity: Not on file  Stress: Not on file  Social Connections: Not on file  Intimate Partner Violence: Not on file    Outpatient Encounter Medications as of 04/05/2022  Medication Sig   cholecalciferol (VITAMIN D) 1000 UNITS tablet Take 1,000 Units by mouth daily.   fenofibrate (TRICOR) 145 MG tablet Take 1 tablet (145 mg total) by mouth daily.   fluticasone (FLONASE) 50 MCG/ACT nasal spray Place 2 sprays into both nostrils daily  for 10 days.   lisinopril (ZESTRIL) 10 MG tablet Take 1 tablet (10 mg total) by mouth daily.   lithium 300 MG tablet Take 2 tablets (600 mg total) by mouth daily.   methocarbamol (ROBAXIN) 500 MG tablet TAKE 1 TABLET BY MOUTH 4 TIMES DAILY.   pantoprazole (PROTONIX) 40 MG tablet Take 40 mg by mouth daily.   QUEtiapine (SEROQUEL) 100 MG tablet TAKE 1 TABLET BY MOUTH EVERYDAY AT BEDTIME   No facility-administered encounter medications on file as of 04/05/2022.    Allergies  Allergen Reactions   Lipitor [Atorvastatin]    Niaspan [Niacin Er]     Review of Systems  Constitutional:  Negative for activity change, appetite change, chills, diaphoresis, fatigue, fever and unexpected weight change.  HENT:  Positive for congestion. Negative for dental problem, drooling, ear discharge, ear pain, facial swelling, hearing loss, mouth sores, nosebleeds, postnasal drip, rhinorrhea, sinus pressure, sinus pain, sneezing, sore throat, tinnitus, trouble swallowing and voice change.   Eyes: Negative.   Respiratory:  Positive for cough. Negative for apnea, choking, chest tightness, shortness of breath, wheezing and stridor.   Cardiovascular:  Negative for chest pain, palpitations and leg swelling.  Gastrointestinal:  Negative for abdominal pain, blood in stool, constipation, diarrhea, nausea and vomiting.  Endocrine: Negative.   Genitourinary:  Negative for decreased urine volume, difficulty urinating, dysuria, frequency and urgency.  Musculoskeletal:  Negative for arthralgias, joint pain, myalgias and neck pain.  Skin: Negative.  Negative for rash.  Allergic/Immunologic: Negative.   Neurological:  Negative for dizziness, tremors, seizures, syncope, facial asymmetry, speech difficulty, weakness, light-headedness, numbness and headaches.  Hematological: Negative.   Psychiatric/Behavioral:  Negative for confusion, hallucinations, sleep disturbance and suicidal ideas.   All other systems reviewed and are  negative.        Observations/Objective: No vital signs or physical exam, this was a virtual health encounter.  Pt alert and oriented, answers all questions appropriately, and able to speak in full sentences.    Assessment and Plan: [name] was seen today for uri.  Diagnoses and all orders for this visit:  URI with cough and congestion Improving daily. Aware to start Mucinex with plenty of water. Continue symptomatic care at home. Report new, worsening, or persistent symptoms. Will refill Flonase today as pt should be using this.     Follow Up Instructions: Return if symptoms worsen or fail to improve.    I discussed the assessment and treatment plan with the patient. The patient was provided an opportunity to ask questions and all were answered. The patient agreed with the plan and demonstrated an understanding of the instructions.   The patient was advised to call back or seek an in-person evaluation if the symptoms worsen or if the condition fails to improve as anticipated.  The above assessment and management plan was discussed with the patient. The patient verbalized understanding of and has agreed to the management plan. Patient is aware to call the clinic if they develop any new symptoms  or if symptoms persist or worsen. Patient is aware when to return to the clinic for a follow-up visit. Patient educated on when it is appropriate to go to the emergency department.    I provided 12 minutes of time during this telephone encounter.   Kari Baars, FNP-C [facility] [address] [place], [place] [address] [phone] 04/05/2022

## 2022-04-06 DIAGNOSIS — T148XXA Other injury of unspecified body region, initial encounter: Secondary | ICD-10-CM | POA: Diagnosis not present

## 2022-04-12 DIAGNOSIS — S83411A Sprain of medial collateral ligament of right knee, initial encounter: Secondary | ICD-10-CM | POA: Diagnosis not present

## 2022-04-20 DIAGNOSIS — F439 Reaction to severe stress, unspecified: Secondary | ICD-10-CM | POA: Diagnosis not present

## 2022-05-04 DIAGNOSIS — S83411D Sprain of medial collateral ligament of right knee, subsequent encounter: Secondary | ICD-10-CM | POA: Diagnosis not present

## 2022-05-06 ENCOUNTER — Telehealth: Payer: BC Managed Care – PPO

## 2022-05-07 DIAGNOSIS — U071 COVID-19: Secondary | ICD-10-CM | POA: Diagnosis not present

## 2022-05-12 DIAGNOSIS — M25561 Pain in right knee: Secondary | ICD-10-CM | POA: Diagnosis not present

## 2022-05-18 DIAGNOSIS — M25561 Pain in right knee: Secondary | ICD-10-CM | POA: Diagnosis not present

## 2022-05-26 DIAGNOSIS — Y999 Unspecified external cause status: Secondary | ICD-10-CM | POA: Diagnosis not present

## 2022-05-26 DIAGNOSIS — X58XXXA Exposure to other specified factors, initial encounter: Secondary | ICD-10-CM | POA: Diagnosis not present

## 2022-05-26 DIAGNOSIS — S83231A Complex tear of medial meniscus, current injury, right knee, initial encounter: Secondary | ICD-10-CM | POA: Diagnosis not present

## 2022-05-26 DIAGNOSIS — G8918 Other acute postprocedural pain: Secondary | ICD-10-CM | POA: Diagnosis not present

## 2022-05-26 DIAGNOSIS — S83241A Other tear of medial meniscus, current injury, right knee, initial encounter: Secondary | ICD-10-CM | POA: Diagnosis not present

## 2022-05-26 DIAGNOSIS — M1711 Unilateral primary osteoarthritis, right knee: Secondary | ICD-10-CM | POA: Diagnosis not present

## 2022-05-26 DIAGNOSIS — M94261 Chondromalacia, right knee: Secondary | ICD-10-CM | POA: Diagnosis not present

## 2022-05-26 DIAGNOSIS — M2341 Loose body in knee, right knee: Secondary | ICD-10-CM | POA: Diagnosis not present

## 2022-05-27 ENCOUNTER — Ambulatory Visit: Payer: BC Managed Care – PPO | Admitting: Adult Health

## 2022-05-30 ENCOUNTER — Ambulatory Visit: Payer: BC Managed Care – PPO | Admitting: Adult Health

## 2022-06-01 DIAGNOSIS — M23303 Other meniscus derangements, unspecified medial meniscus, right knee: Secondary | ICD-10-CM | POA: Diagnosis not present

## 2022-06-01 DIAGNOSIS — M25561 Pain in right knee: Secondary | ICD-10-CM | POA: Diagnosis not present

## 2022-06-06 ENCOUNTER — Telehealth: Payer: Self-pay | Admitting: Family Medicine

## 2022-06-06 NOTE — Telephone Encounter (Signed)
WIFE AWARE

## 2022-06-06 NOTE — Telephone Encounter (Signed)
Will do it then

## 2022-06-07 ENCOUNTER — Telehealth: Payer: Self-pay | Admitting: Family Medicine

## 2022-06-07 NOTE — Telephone Encounter (Signed)
Wife aware to call [place] for orders regarding Lithium

## 2022-06-07 NOTE — Telephone Encounter (Signed)
Patients wife reports they have had a death in the family and will be going out of town. Is asking to have lithium level drawn tomorrow and change upcoming appointment with Alvie Heidelberg to phone visit. Is this ok?

## 2022-06-07 NOTE — Telephone Encounter (Signed)
They should wait until they see Melvin Roberts.

## 2022-06-07 NOTE — Telephone Encounter (Signed)
Patient's wife calling to see if labs can be added to check the patients lithium level. Please call back. Wife would like to speak to nurse to explain.

## 2022-06-08 ENCOUNTER — Other Ambulatory Visit: Payer: Self-pay | Admitting: Adult Health

## 2022-06-08 ENCOUNTER — Telehealth: Payer: Self-pay | Admitting: Adult Health

## 2022-06-08 DIAGNOSIS — Z79899 Other long term (current) drug therapy: Secondary | ICD-10-CM

## 2022-06-08 NOTE — Telephone Encounter (Signed)
We can send in an order.

## 2022-06-08 NOTE — Telephone Encounter (Signed)
Patient is asking to have a lithium level done. He was in the hospital for knee surgery in November and he feels like he is out of whack - or his wife does, with change in his mood. He had level 02/26/22 and it was 0.68.

## 2022-06-08 NOTE — Telephone Encounter (Signed)
Ordered lithium level, mailed patient lab request and notified him.

## 2022-06-08 NOTE — Telephone Encounter (Signed)
Pt LVM 2/20 @ 6:31p.  He said he wants to get his lithium level checked.  He said to check with Barnett Applebaum and see if it's ok.  If so send it to [facility].  Next appt 3/19

## 2022-06-09 ENCOUNTER — Other Ambulatory Visit: Payer: BC Managed Care – PPO

## 2022-06-09 ENCOUNTER — Ambulatory Visit: Payer: BC Managed Care – PPO | Admitting: Family Medicine

## 2022-06-09 DIAGNOSIS — Z79899 Other long term (current) drug therapy: Secondary | ICD-10-CM | POA: Diagnosis not present

## 2022-06-10 LAB — LITHIUM LEVEL: Lithium Lvl: 1.3 mmol/L (ref 0.5–1.2)

## 2022-06-15 DIAGNOSIS — M23303 Other meniscus derangements, unspecified medial meniscus, right knee: Secondary | ICD-10-CM | POA: Diagnosis not present

## 2022-06-15 DIAGNOSIS — M25561 Pain in right knee: Secondary | ICD-10-CM | POA: Diagnosis not present

## 2022-06-22 DIAGNOSIS — F439 Reaction to severe stress, unspecified: Secondary | ICD-10-CM | POA: Diagnosis not present

## 2022-06-27 ENCOUNTER — Telehealth: Payer: Self-pay | Admitting: Family Medicine

## 2022-06-27 NOTE — Telephone Encounter (Signed)
Patient stated that his wife received a call from our office about his labs that were drawn for lithium levels on 2/22 - no notes on this. Please review and call back.

## 2022-06-27 NOTE — Telephone Encounter (Signed)
Please see previous telephone encounters-pt goes through [facility] for Lithium and pt aware to contact them.

## 2022-06-28 DIAGNOSIS — M25561 Pain in right knee: Secondary | ICD-10-CM | POA: Diagnosis not present

## 2022-06-28 DIAGNOSIS — M23303 Other meniscus derangements, unspecified medial meniscus, right knee: Secondary | ICD-10-CM | POA: Diagnosis not present

## 2022-06-29 DIAGNOSIS — M25561 Pain in right knee: Secondary | ICD-10-CM | POA: Diagnosis not present

## 2022-07-04 DIAGNOSIS — M25561 Pain in right knee: Secondary | ICD-10-CM | POA: Diagnosis not present

## 2022-07-05 ENCOUNTER — Ambulatory Visit (INDEPENDENT_AMBULATORY_CARE_PROVIDER_SITE_OTHER): Payer: BC Managed Care – PPO | Admitting: Adult Health

## 2022-07-05 ENCOUNTER — Encounter: Payer: Self-pay | Admitting: Adult Health

## 2022-07-05 DIAGNOSIS — F319 Bipolar disorder, unspecified: Secondary | ICD-10-CM

## 2022-07-05 DIAGNOSIS — G4700 Insomnia, unspecified: Secondary | ICD-10-CM

## 2022-07-05 DIAGNOSIS — Z79899 Other long term (current) drug therapy: Secondary | ICD-10-CM

## 2022-07-05 NOTE — Progress Notes (Signed)
Melvin Roberts [id number] [birth year] 62 y.o.  Subjective:   Patient ID:  Melvin Roberts is a 62 y.o. (DOB 1960/08/29) male.  Chief Complaint: No chief complaint on file.   HPI NKRUMAH HACKERT presents to the office today for follow-up of Bipolar Disorder, high risk medication use and insomnia.  Referred by PCP - Bipolar disorder medications.  Accompanied by wife.  Describes mood today as "ok". Pleasant. Mood symptoms - reporting anxiety, depression, and irritability. Reports worry, rumination, and over thinking. Mood is consistent. Stating "I'm doing a little better than I was a while back". Having issues with his right knee - recovering from knee surgery. He and wife doing well. Feels like current medications are helpful - concerned about kidney functions. Stable interest and motivation. Taking medications as prescribed. Energy levels stable. Active, has a regular exercise routine.  Enjoys some usual interests and activities. Married. Lives with wife. Has 5 children and 11 grandchildren. Spending time with family. Appetite adequate. Weight loss - 220 pounds. Sleeps well most nights. Averages 7 hours. Focus and concentration stable. Completing tasks. Managing aspects of household. Works full time as an Electrical engineer. Denies SI or HI.  Denies AH or VH. Denies self harm. Denies substance use.  Previous medication trials: Lithium, Seroquel    GAD-7    Flowsheet Row Office Visit from 02/23/2022 in [facility] Office Visit from 01/27/2022 in [facility] Office Visit from 12/29/2021 in [facility] Office Visit from 12/27/2021 in [facility] Office Visit from 07/28/2021 in [place]  Total GAD-7 Score 0 3 0 4 7      PHQ2-9    [place] Office Visit from 02/23/2022 in [place] Office Visit from 01/27/2022 in [place] Office Visit from 12/29/2021 in [place] Office Visit from 12/27/2021 in [place] Office Visit from 07/28/2021 in [facility]  PHQ-2 Total Score 0 0 0 2 2  PHQ-9 Total Score 0 1 0 8 [place] ED from 02/26/2022 in [facility] Emergency Department at [facility] ED from 09/20/2020 in [facility] Emergency Department at [place] No Risk No Risk        Review of Systems:  Review of Systems  Musculoskeletal:  Negative for gait problem.  Neurological:  Negative for tremors.  Psychiatric/Behavioral:         Please refer to HPI    Medications: I have reviewed the patient's current medications.  Current Outpatient Medications  Medication Sig Dispense Refill   cholecalciferol (VITAMIN D) 1000 UNITS tablet Take 1,000 Units by mouth daily.     fenofibrate (TRICOR) 145 MG tablet Take 1 tablet (145 mg total) by mouth daily. 90 tablet 3   fluticasone (FLONASE) 50 MCG/ACT nasal spray Place 2 sprays into both nostrils daily. 16 g 0   lisinopril (ZESTRIL) 10 MG tablet Take 1 tablet (10 mg total) by mouth daily. 90 tablet 3   lithium 300 MG tablet Take 2 tablets (600 mg total) by mouth daily. 180 tablet 3   methocarbamol (ROBAXIN) 500 MG tablet TAKE 1 TABLET BY MOUTH 4 TIMES DAILY. 120 tablet 1   pantoprazole (PROTONIX) 40 MG tablet Take 40 mg by mouth daily.  QUEtiapine (SEROQUEL) 100 MG tablet TAKE 1 TABLET BY MOUTH EVERYDAY AT BEDTIME 90 tablet 1   No current facility-administered medications for this visit.    Medication Side Effects: None  Allergies:  Allergies  Allergen Reactions   Lipitor [Atorvastatin]    Niacin    Niaspan [Niacin Er]     Past Medical History:  Diagnosis Date   Bipolar 1 disorder ([place])     Hyperlipidemia    Hypertension     Past Medical History, Surgical history, Social history, and Family history were reviewed and updated as appropriate.   Please see review of systems for further details on the patient's review from today.   Objective:   Physical Exam:  There were no vitals taken for this visit.  Physical Exam Constitutional:      General: He is not in acute distress. Musculoskeletal:        General: No deformity.  Neurological:     Mental Status: He is alert and oriented to person, place, and time.     Coordination: Coordination normal.  Psychiatric:        Attention and Perception: Attention and perception normal. He does not perceive auditory or visual hallucinations.        Mood and Affect: Mood normal. Mood is not anxious or depressed. Affect is not labile, blunt, angry or inappropriate.        Speech: Speech normal.        Behavior: Behavior normal.        Thought Content: Thought content normal. Thought content is not paranoid or delusional. Thought content does not include homicidal or suicidal ideation. Thought content does not include homicidal or suicidal plan.        Cognition and Memory: Cognition and memory normal.        Judgment: Judgment normal.     Comments: Insight intact     Lab Review:     Component Value Date/Time   NA 137 02/26/2022 2205   NA 135 01/27/2022 1529   K 4.4 02/26/2022 2205   CL 108 02/26/2022 2205   CO2 22 02/26/2022 2205   GLUCOSE 117 (H) 02/26/2022 2205   BUN 21 02/26/2022 2205   BUN 32 (H) 01/27/2022 1529   CREATININE 1.30 (H) 02/26/2022 2205   CALCIUM 9.8 02/26/2022 2205   PROT 7.1 02/26/2022 2205   PROT 7.0 01/27/2022 1529   ALBUMIN 4.0 02/26/2022 2205   ALBUMIN 4.7 01/27/2022 1529   AST 48 (H) 02/26/2022 2205   ALT 58 (H) 02/26/2022 2205   ALKPHOS 63 02/26/2022 2205   BILITOT 0.4 02/26/2022 2205   BILITOT 0.2 01/27/2022 1529   GFRNONAA >60 02/26/2022 2205   GFRAA 61 05/29/2020 0854       Component  Value Date/Time   WBC 9.1 02/26/2022 2205   RBC 4.00 (L) 02/26/2022 2205   HGB 12.4 (L) 02/26/2022 2205   HGB 12.8 (L) 01/27/2022 1529   HCT 37.3 (L) 02/26/2022 2205   HCT 37.3 (L) 01/27/2022 1529   PLT 241 02/26/2022 2205   PLT 303 01/27/2022 1529   MCV 93.3 02/26/2022 2205   MCV 91 01/27/2022 1529   MCH 31.0 02/26/2022 2205   MCHC 33.2 02/26/2022 2205   RDW 15.7 (H) 02/26/2022 2205   RDW 14.3 01/27/2022 1529   LYMPHSABS 2.0 02/26/2022 2205   LYMPHSABS 2.2 01/27/2022 1529   MONOABS 0.6 02/26/2022 2205   EOSABS 0.1 02/26/2022 2205   EOSABS 0.1 01/27/2022 1529   BASOSABS 0.0 02/26/2022 2205  BASOSABS 0.0 01/27/2022 1529    Lithium Lvl  Date Value Ref Range Status  06/09/2022 1.3 (HH) 0.5 - 1.2 mmol/L Final    Comment:    A concentration of 0.5-0.8 mmol/L is advised for long-term use; concentrations of up to 1.2 mmol/L may be necessary during acute treatment.                                  Detection Limit = 0.1                           <0.1 indicates None Detected Patient drug level exceeds published reference range.  Evaluate clinically for signs of potential toxicity.      No results found for: "PHENYTOIN", "PHENOBARB", "VALPROATE", "CBMZ"   .res Assessment: Plan:    Plan:  PDMP reviewed  Continue Seroquel 100mg  at hs  Plan to D/C Lithium 300mg  BID - will consider other options - will call with taper schedule. Also admits to taking increased ibuprofen for pain - advised that could also be contributing issues.  Decrease Litium 300/300 to 300/150 x 3 days, then decrease to 150/150 x 3 days, then 150mg  x 3 days, then discontinue. Add Depakote 250mg  daily x 3 days, then increase to 250mg  BID.   Consider Depakote  Taking more ibuprofen than usual  Labs: TSH, Lithium, and BMP  RTC 3 months  Time spent with patient was 25 minutes. Greater than 50% of face to face time with patient was spent on counseling and coordination of care.   Reviewed signs and  symptoms of lithium toxicity, including; slurred speech, worsening tremors, nausea, vomiting, diarrhea, confusion and ataxia. Patient instructed to notify clinic if experiencing the symptoms or go to the ER if the symptoms are severe or occurring outside of office hours. Discussed potential drug interactions. Discussed hydration and risk of toxicity. Discussed avoiding diuretics or Motrin and advised patient to notify office if started on either. Discussed routine blood monitoring For renal and thyroid functioning due to potential long-term effects associated with lithium use.   Discussed potential metabolic side effects associated with atypical antipsychotics, as well as potential risk for movement side effects. Advised pt to contact office if movement side effects occur.    Patient advised to contact office with any questions, adverse effects, or acute worsening in signs and symptoms.  Diagnoses and all orders for this visit:  Bipolar I disorder ([place])  Insomnia, unspecified type  High risk medication use     Please see After Visit Summary for patient specific instructions.  Future Appointments  Date Time Provider [place]  08/01/2022  3:25 PM Claretta Fraise, MD [facility] None    No orders of the defined types were placed in this encounter.   -------------------------------

## 2022-07-07 DIAGNOSIS — M25561 Pain in right knee: Secondary | ICD-10-CM | POA: Diagnosis not present

## 2022-07-07 MED ORDER — DIVALPROEX SODIUM 250 MG PO DR TAB: 250.0000 mg | DELAYED_RELEASE_TABLET | Freq: Two times a day (BID) | ORAL | 2 refills | Status: DC

## 2022-07-07 NOTE — Addendum Note (Signed)
Addended by: Aloha Gell on: 07/07/2022 11:03 AM   Modules accepted: Orders

## 2022-07-11 DIAGNOSIS — M25561 Pain in right knee: Secondary | ICD-10-CM | POA: Diagnosis not present

## 2022-07-16 ENCOUNTER — Other Ambulatory Visit: Payer: Self-pay | Admitting: Family Medicine

## 2022-07-16 DIAGNOSIS — G4700 Insomnia, unspecified: Secondary | ICD-10-CM

## 2022-07-16 DIAGNOSIS — F319 Bipolar disorder, unspecified: Secondary | ICD-10-CM

## 2022-07-25 DIAGNOSIS — F439 Reaction to severe stress, unspecified: Secondary | ICD-10-CM | POA: Diagnosis not present

## 2022-08-01 ENCOUNTER — Encounter: Payer: BC Managed Care – PPO | Admitting: Family Medicine

## 2022-08-03 ENCOUNTER — Telehealth: Payer: Self-pay | Admitting: Family Medicine

## 2022-08-03 NOTE — Telephone Encounter (Signed)
FORM RECEIVED AND WILL BE FAXED OVER FOLLOWING SURGICAL CLEARANCE APPOINTMENT SCHEDULED FOR 4/23

## 2022-08-09 ENCOUNTER — Ambulatory Visit: Payer: BC Managed Care – PPO | Admitting: Family Medicine

## 2022-08-09 ENCOUNTER — Encounter: Payer: Self-pay | Admitting: Family Medicine

## 2022-08-09 VITALS — BP 120/80 | HR 79 | Temp 98.3°F | Ht 70.5 in | Wt 218.2 lb

## 2022-08-09 DIAGNOSIS — K2100 Gastro-esophageal reflux disease with esophagitis, without bleeding: Secondary | ICD-10-CM

## 2022-08-09 DIAGNOSIS — M1710 Unilateral primary osteoarthritis, unspecified knee: Secondary | ICD-10-CM

## 2022-08-09 DIAGNOSIS — I10 Essential (primary) hypertension: Secondary | ICD-10-CM | POA: Diagnosis not present

## 2022-08-09 DIAGNOSIS — F3171 Bipolar disorder, in partial remission, most recent episode hypomanic: Secondary | ICD-10-CM | POA: Diagnosis not present

## 2022-08-09 NOTE — Progress Notes (Signed)
Subjective:  Patient ID: DAVON ABDELAZIZ, male    DOB: 1960-05-20  Age: 62 y.o. MRN: [id number]  CC: Surgical Clearance   HPI GURSHAN SETTLEMIRE presents for clearance for surgery for right knee replacement.   presents for  follow-up of hypertension. Patient has no history of headache chest pain or shortness of breath or recent cough. Patient also denies symptoms of TIA such as focal numbness or weakness. Patient denies side effects from medication. States taking it regularly.  Recently switched from lithium to depakote for bipolar disorder. Works well and feels much better overall. Takes seroquel as well for this.          08/09/2022    3:56 PM 08/09/2022    3:51 PM 02/23/2022    8:33 AM  Depression screen PHQ 2/9  Decreased Interest 1 0 0  Down, Depressed, Hopeless 1 0 0  PHQ - 2 Score 2 0 0  Altered sleeping 2  0  Tired, decreased energy 0  0  Change in appetite 0  0  Feeling bad or failure about yourself  0  0  Trouble concentrating 1  0  Moving slowly or fidgety/restless 0  0  Suicidal thoughts 0  0  PHQ-9 Score 5  0  Difficult doing work/chores Not difficult at all  Not difficult at all    History Mackinley has a past medical history of Bipolar 1 disorder, Hyperlipidemia, and Hypertension.   He has a past surgical history that includes Back surgery (11/17/95); Hernia repair (2009); Eye surgery; and Upper gastrointestinal endoscopy (x2).   His family history includes Cancer in his father, maternal grandfather, mother, and paternal grandfather.He reports that he has never smoked. He has never used smokeless tobacco. He reports that he does not drink alcohol and does not use drugs.    ROS Review of Systems  Constitutional: Negative.  Negative for fever.  HENT: Negative.    Eyes:  Negative for visual disturbance.  Respiratory:  Negative for cough and shortness of breath.   Cardiovascular:  Negative for chest pain and leg swelling.  Gastrointestinal:  Negative  for abdominal pain, diarrhea, nausea and vomiting.  Genitourinary:  Negative for difficulty urinating.  Musculoskeletal:  Negative for arthralgias and myalgias.  Skin:  Negative for rash.  Neurological:  Negative for headaches.  Psychiatric/Behavioral:  Negative for sleep disturbance.     Objective:  BP 120/80   Pulse 79   Temp 98.3 F (36.8 C)   Ht 5' 10.5" (1.791 m)   Wt 218 lb 3.2 oz (99 kg)   SpO2 97%   BMI 30.87 kg/m   BP Readings from Last 3 Encounters:  08/09/22 120/80  02/26/22 138/88  02/23/22 126/74    Wt Readings from Last 3 Encounters:  08/09/22 218 lb 3.2 oz (99 kg)  02/26/22 225 lb (102.1 kg)  02/23/22 226 lb 3.2 oz (102.6 kg)     Physical Exam Vitals reviewed.  Constitutional:      Appearance: He is well-developed.  HENT:     Head: Normocephalic and atraumatic.     Right Ear: External ear normal.     Left Ear: External ear normal.     Mouth/Throat:     Pharynx: No oropharyngeal exudate or posterior oropharyngeal erythema.  Eyes:     Pupils: Pupils are equal, round, and reactive to light.  Cardiovascular:     Rate and Rhythm: Normal rate and regular rhythm.     Heart sounds: No murmur heard. Pulmonary:  Effort: No respiratory distress.     Breath sounds: Normal breath sounds.  Musculoskeletal:     Cervical back: Normal range of motion and neck supple.  Neurological:     Mental Status: He is alert and oriented to person, place, and time.       Assessment & Plan:   Haddon was seen today for surgical clearance.  Diagnoses and all orders for this visit:  Primary hypertension  Bipolar disorder, in partial remission, most recent episode hypomanic  Arthritis of knee  Gastroesophageal reflux disease with esophagitis without hemorrhage       I have discontinued [name] "[name]"'s lithium. I am also having him maintain his cholecalciferol, pantoprazole, fenofibrate, lisinopril, methocarbamol, fluticasone, divalproex,  and QUEtiapine.  Surgical clearance given, low risk. Form faxed to [employer] and insurance carrier per his request.   No NSAIDS for 1 week prior to surgery.    Follow-up: Return in about 6 months (around 02/08/2023), or if symptoms worsen or fail to improve.  Mechele Claude, M.D.

## 2022-08-11 ENCOUNTER — Other Ambulatory Visit: Payer: Self-pay | Admitting: Adult Health

## 2022-08-11 DIAGNOSIS — F319 Bipolar disorder, unspecified: Secondary | ICD-10-CM

## 2022-08-15 DIAGNOSIS — Z01812 Encounter for preprocedural laboratory examination: Secondary | ICD-10-CM | POA: Diagnosis not present

## 2022-08-15 DIAGNOSIS — M1711 Unilateral primary osteoarthritis, right knee: Secondary | ICD-10-CM | POA: Diagnosis not present

## 2022-08-15 DIAGNOSIS — M25561 Pain in right knee: Secondary | ICD-10-CM | POA: Diagnosis not present

## 2022-08-23 ENCOUNTER — Ambulatory Visit: Payer: BC Managed Care – PPO | Admitting: Adult Health

## 2022-08-23 ENCOUNTER — Encounter: Payer: Self-pay | Admitting: Adult Health

## 2022-08-23 DIAGNOSIS — F319 Bipolar disorder, unspecified: Secondary | ICD-10-CM | POA: Diagnosis not present

## 2022-08-23 DIAGNOSIS — G4700 Insomnia, unspecified: Secondary | ICD-10-CM

## 2022-08-23 MED ORDER — QUETIAPINE FUMARATE 100 MG PO TABS: ORAL_TABLET | ORAL | 0 refills | Status: DC

## 2022-08-23 MED ORDER — DIVALPROEX SODIUM 500 MG PO DR TAB: 500.0000 mg | DELAYED_RELEASE_TABLET | Freq: Two times a day (BID) | ORAL | 2 refills | Status: DC

## 2022-08-23 NOTE — Progress Notes (Signed)
MARIEO FIPPS [id number] Mar 12, 1961 62 y.o.  Subjective:   Patient ID:  Melvin Roberts is a 62 y.o. (DOB Sep 10, 1960) male.  Chief Complaint: No chief complaint on file.   HPI Melvin Roberts presents to the office today for follow-up of Bipolar Disorder, high risk medication use and insomnia.  Referred by PCP - Bipolar disorder medications.  Accompanied by wife.  Describes mood today as "ok". Pleasant. Mood symptoms - reporting anxiety and irritability. Denies depression. Reports worry, rumination, and over thinking. Mood is elevated - communicative. Stating "I'm doing a little better". Has transitioned off of Lithium and onto Depakote. Has tolerated the change, but mood is elevated. Willing to consider options - increase in Depakote.Marland Kitchen He and wife doing well. Stable interest and motivation. Taking medications as prescribed. Energy levels stable. Active, does not have a regular exercise routine - knee replacement.  Enjoys some usual interests and activities. Married. Lives with wife. Has 5 children and 11 grandchildren. Spending time with family. Appetite adequate. Weight loss - 215 from 220 pounds. Sleeps well most nights. Averages 4 hours. Focus and concentration difficultness - getting hyper-focused. Completing tasks. Managing aspects of household. Works full time as an Retail banker - out of work. Denies SI or HI.  Denies AH or VH. Denies self harm. Denies substance use.  Previous medication trials: Lithium, Seroquel   GAD-7    Flowsheet Row Office Visit from 08/09/2022 in [facility] Office Visit from 02/23/2022 in [facility] Office Visit from 01/27/2022 in [facility] Office Visit from 12/29/2021 in [facility] Office Visit from 12/27/2021 in [facility]  Total GAD-7 Score 4 0 3 0 4       PHQ2-9    Flowsheet Row Office Visit from 08/09/2022 in [facility] Office Visit from 02/23/2022 in [facility] Office Visit from 01/27/2022 in [facility] Office Visit from 12/29/2021 in [facility] Office Visit from 12/27/2021 in [facility]  PHQ-2 Total Score 2 0 0 0 2  PHQ-9 Total Score 5 0 1 0 8      Flowsheet Row ED from 02/26/2022 in [facility] Emergency Department at [facility] ED from 09/20/2020 in [facility] Department at [facility]  C-SSRS RISK CATEGORY No Risk No Risk        Review of Systems:  Review of Systems  Musculoskeletal:  Negative for gait problem.  Neurological:  Negative for tremors.  Psychiatric/Behavioral:         Please refer to HPI    Medications: I have reviewed the patient's current medications.  Current Outpatient Medications  Medication Sig Dispense Refill   cholecalciferol (VITAMIN D) 1000 UNITS tablet Take 1,000 Units by mouth daily.     divalproex (DEPAKOTE) 250 MG DR tablet Take 1 tablet (250 mg total) by mouth 2 (two) times daily. 60 tablet 2   fenofibrate (TRICOR) 145 MG tablet Take 1 tablet (145 mg total) by mouth daily. 90 tablet 3   fluticasone (FLONASE) 50 MCG/ACT nasal spray Place 2 sprays into both nostrils daily. 16 g 0   lisinopril (ZESTRIL) 10 MG tablet Take 1 tablet (10 mg total) by mouth daily. 90 tablet 3   methocarbamol (ROBAXIN) 500 MG tablet TAKE 1 TABLET BY MOUTH 4 TIMES DAILY. 120  tablet 1   pantoprazole (PROTONIX) 40 MG tablet Take 40 mg by mouth daily.     QUEtiapine (SEROQUEL) 100 MG tablet TAKE 1 TABLET BY MOUTH EVERYDAY AT BEDTIME 90 tablet 0   No current facility-administered medications for this visit.    Medication Side Effects: None  Allergies:  Allergies  Allergen Reactions   Lipitor [Atorvastatin]    Niacin     Niaspan [Niacin Er]     Past Medical History:  Diagnosis Date   Bipolar 1 disorder ([facility])    Hyperlipidemia    Hypertension     Past Medical History, Surgical history, Social history, and Family history were reviewed and updated as appropriate.   Please see review of systems for further details on the patient's review from today.   Objective:   Physical Exam:  There were no vitals taken for this visit.  Physical Exam Constitutional:      General: He is not in acute distress. Musculoskeletal:        General: No deformity.  Neurological:     Mental Status: He is alert and oriented to person, place, and time.     Coordination: Coordination normal.  Psychiatric:        Attention and Perception: Attention and perception normal. He does not perceive auditory or visual hallucinations.        Mood and Affect: Mood normal. Mood is not anxious or depressed. Affect is not labile, blunt, angry or inappropriate.        Speech: Speech normal.        Behavior: Behavior normal.        Thought Content: Thought content normal. Thought content is not paranoid or delusional. Thought content does not include homicidal or suicidal ideation. Thought content does not include homicidal or suicidal plan.        Cognition and Memory: Cognition and memory normal.        Judgment: Judgment normal.     Comments: Insight intact     Lab Review:     Component Value Date/Time   NA 137 02/26/2022 2205   NA 135 01/27/2022 1529   K 4.4 02/26/2022 2205   CL 108 02/26/2022 2205   CO2 22 02/26/2022 2205   GLUCOSE 117 (H) 02/26/2022 2205   BUN 21 02/26/2022 2205   BUN 32 (H) 01/27/2022 1529   CREATININE 1.30 (H) 02/26/2022 2205   CALCIUM 9.8 02/26/2022 2205   PROT 7.1 02/26/2022 2205   PROT 7.0 01/27/2022 1529   ALBUMIN 4.0 02/26/2022 2205   ALBUMIN 4.7 01/27/2022 1529   AST 48 (H) 02/26/2022 2205   ALT 58 (H) 02/26/2022 2205   ALKPHOS 63 02/26/2022 2205   BILITOT 0.4 02/26/2022 2205   BILITOT  0.2 01/27/2022 1529   GFRNONAA >60 02/26/2022 2205   GFRAA 61 05/29/2020 0854       Component Value Date/Time   WBC 9.1 02/26/2022 2205   RBC 4.00 (L) 02/26/2022 2205   HGB 12.4 (L) 02/26/2022 2205   HGB 12.8 (L) 01/27/2022 1529   HCT 37.3 (L) 02/26/2022 2205   HCT 37.3 (L) 01/27/2022 1529   PLT 241 02/26/2022 2205   PLT 303 01/27/2022 1529   MCV 93.3 02/26/2022 2205   MCV 91 01/27/2022 1529   MCH 31.0 02/26/2022 2205   MCHC 33.2 02/26/2022 2205   RDW 15.7 (H) 02/26/2022 2205   RDW 14.3 01/27/2022 1529   LYMPHSABS 2.0 02/26/2022 2205   LYMPHSABS 2.2 01/27/2022 1529   MONOABS 0.6 02/26/2022  2205   EOSABS 0.1 02/26/2022 2205   EOSABS 0.1 01/27/2022 1529   BASOSABS 0.0 02/26/2022 2205   BASOSABS 0.0 01/27/2022 1529    Lithium Lvl  Date Value Ref Range Status  06/09/2022 1.3 (HH) 0.5 - 1.2 mmol/L Final    Comment:    A concentration of 0.5-0.8 mmol/L is advised for long-term use; concentrations of up to 1.2 mmol/L may be necessary during acute treatment.                                  Detection Limit = 0.1                           <0.1 indicates None Detected Patient drug level exceeds published reference range.  Evaluate clinically for signs of potential toxicity.      No results found for: "PHENYTOIN", "PHENOBARB", "VALPROATE", "CBMZ"   .res Assessment: Plan:    Plan:  PDMP reviewed  Increase Seroquel 100mg  to 150mg  at hs Increase Depakote 250mg  BID to 500mg  BID  RTC 3 months  Time spent with patient was 25 minutes. Greater than 50% of face to face time with patient was spent on counseling and coordination of care.   Reviewed signs and symptoms of lithium toxicity, including; slurred speech, worsening tremors, nausea, vomiting, diarrhea, confusion and ataxia. Patient instructed to notify clinic if experiencing the symptoms or go to the ER if the symptoms are severe or occurring outside of office hours. Discussed potential drug interactions. Discussed  hydration and risk of toxicity. Discussed avoiding diuretics or Motrin and advised patient to notify office if started on either. Discussed routine blood monitoring For renal and thyroid functioning due to potential long-term effects associated with lithium use.   Discussed potential metabolic side effects associated with atypical antipsychotics, as well as potential risk for movement side effects. Advised pt to contact office if movement side effects occur.    Patient advised to contact office with any questions, adverse effects, or acute worsening in signs and symptoms. There are no diagnoses linked to this encounter.   Please see After Visit Summary for patient specific instructions.  Future Appointments  Date Time Provider Department Center  08/30/2022  3:25 PM Mechele Claude, MD [facility] None    No orders of the defined types were placed in this encounter.   -------------------------------

## 2022-08-26 DIAGNOSIS — F439 Reaction to severe stress, unspecified: Secondary | ICD-10-CM | POA: Diagnosis not present

## 2022-08-30 ENCOUNTER — Encounter: Payer: BC Managed Care – PPO | Admitting: Family Medicine

## 2022-08-31 DIAGNOSIS — M25561 Pain in right knee: Secondary | ICD-10-CM | POA: Diagnosis not present

## 2022-09-01 DIAGNOSIS — M1711 Unilateral primary osteoarthritis, right knee: Secondary | ICD-10-CM | POA: Diagnosis not present

## 2022-09-20 ENCOUNTER — Encounter: Payer: Self-pay | Admitting: Adult Health

## 2022-09-20 ENCOUNTER — Ambulatory Visit (INDEPENDENT_AMBULATORY_CARE_PROVIDER_SITE_OTHER): Payer: BC Managed Care – PPO | Admitting: Adult Health

## 2022-09-20 DIAGNOSIS — G4700 Insomnia, unspecified: Secondary | ICD-10-CM | POA: Diagnosis not present

## 2022-09-20 DIAGNOSIS — F319 Bipolar disorder, unspecified: Secondary | ICD-10-CM

## 2022-09-20 DIAGNOSIS — Z79899 Other long term (current) drug therapy: Secondary | ICD-10-CM

## 2022-09-20 NOTE — Progress Notes (Signed)
Melvin Roberts [id number] Sep 02, 1960 62 y.o.  Subjective:   Patient ID:  Melvin Roberts is a 62 y.o. (DOB 09/19/1960) male.  Chief Complaint: No chief complaint on file.   HPI Melvin Roberts presents to the office today for follow-up of Bipolar Disorder, high risk medication use and insomnia.  Referred by PCP - Bipolar disorder medications.  Accompanied by wife - not present for interview.  Describes mood today as "ok". Pleasant. Denies tearfulness. Mood symptoms - reporting irritability. Denies depression and anxiety. Reports worry, rumination, and over thinking. Mood has improved. Stating "I feel a lot calmer". Feels like the Depakote and Seroquel are working well for him. He and wife doing well. Reports ongoing concerns about son's mental health. Stable interest and motivation. Taking medications as prescribed. Energy levels stable. Active, does not have a regular exercise routine - knee replacement.  Enjoys some usual interests and activities. Married. Lives with wife. Has 5 children and 11 grandchildren. Spending time with family. Appetite adequate. Weight loss - 210 from 220 pounds. Reports sleeping difficulties. Averages 4 to 5 hours - pain related Focus and concentration difficultness - getting hyper-focused. Completing tasks. Managing aspects of household. Works full time as an Retail banker - out of work. Denies Melvin or HI.  Denies AH or VH. Denies self harm. Denies substance use.  Previous medication trials: Lithium, Seroquel   GAD-7    Flowsheet Row Office Visit from 08/09/2022 in [facility] Office Visit from 02/23/2022 in [facility] Office Visit from 01/27/2022 in [facility] Office Visit from 12/29/2021 in [facility] Office Visit from 12/27/2021 in [facility]  Total GAD-7 Score  4 0 3 0 4      PHQ2-9    Flowsheet Row Office Visit from 08/09/2022 in [facility] Office Visit from 02/23/2022 in [facility] Office Visit from 01/27/2022 in [facility] Office Visit from 12/29/2021 in [facility] Office Visit from 12/27/2021 in [facility]  PHQ-2 Total Score 2 0 0 0 2  PHQ-9 Total Score 5 0 1 0 8      Flowsheet Row ED from 02/26/2022 in [facility] at [facility] ED from 09/20/2020 in [place] [facility] at [facility]  C-SSRS RISK CATEGORY No Risk No Risk        Review of Systems:  Review of Systems  Musculoskeletal:  Negative for gait problem.  Neurological:  Negative for tremors.  Psychiatric/Behavioral:         Please refer to HPI    Medications: I have reviewed the patient's current medications.  Current Outpatient Medications  Medication Sig Dispense Refill   cholecalciferol (VITAMIN D) 1000 UNITS tablet Take 1,000 Units by mouth daily.     divalproex (DEPAKOTE) 500 MG DR tablet Take 1 tablet (500 mg total) by mouth 2 (two) times daily. 60 tablet 2   fenofibrate (TRICOR) 145 MG tablet Take 1 tablet (145 mg total) by mouth daily. 90 tablet 3   fluticasone (FLONASE) 50 MCG/ACT nasal spray Place 2 sprays into both nostrils daily. 16 g 0   lisinopril (ZESTRIL) 10 MG tablet Take 1 tablet (10 mg total) by mouth daily. 90 tablet 3   methocarbamol (ROBAXIN) 500 MG tablet TAKE 1 TABLET BY MOUTH 4  TIMES DAILY. 120 tablet 1   pantoprazole (PROTONIX) 40 MG tablet Take 40 mg by mouth daily.     QUEtiapine (SEROQUEL) 100 MG tablet Take one and 1/2 tablets at bedtime. 135 tablet 0   No current facility-administered medications for this visit.    Medication Side Effects: None  Allergies:  Allergies  Allergen Reactions   Lipitor [Atorvastatin]     Niacin    Niaspan [Niacin Er]     Past Medical History:  Diagnosis Date   Bipolar 1 disorder (HCC)    Hyperlipidemia    Hypertension     Past Medical History, Surgical history, Social history, and Family history were reviewed and updated as appropriate.   Please see review of systems for further details on the patient's review from today.   Objective:   Physical Exam:  There were no vitals taken for this visit.  Physical Exam Constitutional:      General: He is not in acute distress. Musculoskeletal:        General: No deformity.  Neurological:     Mental Status: He is alert and oriented to person, place, and time.     Coordination: Coordination normal.  Psychiatric:        Attention and Perception: Attention and perception normal. He does not perceive auditory or visual hallucinations.        Mood and Affect: Mood normal. Mood is not anxious or depressed. Affect is not labile, blunt, angry or inappropriate.        Speech: Speech normal.        Behavior: Behavior normal.        Thought Content: Thought content normal. Thought content is not paranoid or delusional. Thought content does not include homicidal or suicidal ideation. Thought content does not include homicidal or suicidal plan.        Cognition and Memory: Cognition and memory normal.        Judgment: Judgment normal.     Comments: Insight intact     Lab Review:     Component Value Date/Time   NA 137 02/26/2022 2205   NA 135 01/27/2022 1529   K 4.4 02/26/2022 2205   CL 108 02/26/2022 2205   CO2 22 02/26/2022 2205   GLUCOSE 117 (H) 02/26/2022 2205   BUN 21 02/26/2022 2205   BUN 32 (H) 01/27/2022 1529   CREATININE 1.30 (H) 02/26/2022 2205   CALCIUM 9.8 02/26/2022 2205   PROT 7.1 02/26/2022 2205   PROT 7.0 01/27/2022 1529   ALBUMIN 4.0 02/26/2022 2205   ALBUMIN 4.7 01/27/2022 1529   AST 48 (H) 02/26/2022 2205   ALT 58 (H) 02/26/2022 2205   ALKPHOS 63 02/26/2022 2205   BILITOT 0.4 02/26/2022 2205    BILITOT 0.2 01/27/2022 1529   GFRNONAA >60 02/26/2022 2205   GFRAA 61 05/29/2020 0854       Component Value Date/Time   WBC 9.1 02/26/2022 2205   RBC 4.00 (L) 02/26/2022 2205   HGB 12.4 (L) 02/26/2022 2205   HGB 12.8 (L) 01/27/2022 1529   HCT 37.3 (L) 02/26/2022 2205   HCT 37.3 (L) 01/27/2022 1529   PLT 241 02/26/2022 2205   PLT 303 01/27/2022 1529   MCV 93.3 02/26/2022 2205   MCV 91 01/27/2022 1529   MCH 31.0 02/26/2022 2205   MCHC 33.2 02/26/2022 2205   RDW 15.7 (H) 02/26/2022 2205   RDW 14.3 01/27/2022 1529   LYMPHSABS 2.0 02/26/2022 2205   LYMPHSABS 2.2 01/27/2022 1529   MONOABS  0.6 02/26/2022 2205   EOSABS 0.1 02/26/2022 2205   EOSABS 0.1 01/27/2022 1529   BASOSABS 0.0 02/26/2022 2205   BASOSABS 0.0 01/27/2022 1529    Lithium Lvl  Date Value Ref Range Status  06/09/2022 1.3 (HH) 0.5 - 1.2 mmol/L Final    Comment:    A concentration of 0.5-0.8 mmol/L is advised for long-term use; concentrations of up to 1.2 mmol/L may be necessary during acute treatment.                                  Detection Limit = 0.1                           <0.1 indicates None Detected Patient drug level exceeds published reference range.  Evaluate clinically for signs of potential toxicity.      No results found for: "PHENYTOIN", "PHENOBARB", "VALPROATE", "CBMZ"   .res Assessment: Plan:    Plan:  PDMP reviewed  Seroquel 150mg  at hs Depakote 500mg  BID  RTC 3 months  Time spent with patient was 25 minutes. Greater than 50% of face to face time with patient was spent on counseling and coordination of care.   Discussed potential metabolic side effects associated with atypical antipsychotics, as well as potential risk for movement side effects. Advised pt to contact office if movement side effects occur.    Patient advised to contact office with any questions, adverse effects, or acute worsening in signs and symptoms.  There are no diagnoses linked to this encounter.    Please see After Visit Summary for patient specific instructions.  Future Appointments  Date Time Provider Department Center  09/20/2022  5:00 PM Nikolaj Geraghty, Thereasa Solo, NP [facility] None    No orders of the defined types were placed in this encounter.   -------------------------------

## 2022-09-22 ENCOUNTER — Other Ambulatory Visit: Payer: BC Managed Care – PPO

## 2022-09-22 DIAGNOSIS — Z79899 Other long term (current) drug therapy: Secondary | ICD-10-CM | POA: Diagnosis not present

## 2022-09-23 LAB — CMP AND LIVER
ALT: 15 IU/L (ref 0–44)
AST: 21 IU/L (ref 0–40)
Albumin: 4.2 g/dL (ref 3.9–4.9)
Alkaline Phosphatase: 57 IU/L (ref 44–121)
BUN: 27 mg/dL (ref 8–27)
Bilirubin Total: <0.2 mg/dL (ref 0.0–1.2)
Bilirubin, Direct: <0.1 mg/dL (ref 0.00–0.40)
CO2: 21 mmol/L (ref 20–29)
Calcium: 10.5 mg/dL — ABNORMAL HIGH (ref 8.6–10.2)
Chloride: 105 mmol/L (ref 96–106)
Creatinine, Ser: 1.45 mg/dL — ABNORMAL HIGH (ref 0.76–1.27)
Glucose: 94 mg/dL (ref 70–99)
Potassium: 5.2 mmol/L (ref 3.5–5.2)
Sodium: 140 mmol/L (ref 134–144)
Total Protein: 6.6 g/dL (ref 6.0–8.5)
eGFR: 55 mL/min/{1.73_m2} — ABNORMAL LOW (ref >59)

## 2022-09-26 ENCOUNTER — Ambulatory Visit: Payer: BC Managed Care – PPO | Admitting: Family Medicine

## 2022-09-26 ENCOUNTER — Encounter: Payer: Self-pay | Admitting: Family Medicine

## 2022-09-26 VITALS — BP 139/85 | HR 81 | Ht 70.5 in | Wt 214.0 lb

## 2022-09-26 DIAGNOSIS — N179 Acute kidney failure, unspecified: Secondary | ICD-10-CM | POA: Diagnosis not present

## 2022-09-26 NOTE — Progress Notes (Signed)
BP 139/85   Pulse 81   Ht 5' 10.5" (1.791 m)   Wt 214 lb (97.1 kg)   SpO2 97%   BMI 30.27 kg/m    Subjective:   Patient ID: Melvin Roberts, male    DOB: 10-16-1960, 62 y.o.   MRN: [id number]  HPI: Melvin Roberts is a 62 y.o. male presenting on 09/26/2022 for Re check kidney function   HPI Elevated kidney function Patient is coming in today for follow-up with elevated kidney function.  He did see his psychiatrist and they did see that his kidney function had gone up.  They did stop him on the lithium and started him on the Depakote and made adjustments.  He denies any urinary issues but just wants to make sure that things are doing okay.  He is also come off the ibuprofen as well.  Relevant past medical, surgical, family and social history reviewed and updated as indicated. Interim medical history since our last visit reviewed. Allergies and medications reviewed and updated.  Review of Systems  Constitutional:  Negative for chills and fever.  Respiratory:  Negative for shortness of breath and wheezing.   Cardiovascular:  Negative for chest pain and leg swelling.  Musculoskeletal:  Negative for back pain and gait problem.  Skin:  Negative for rash.  All other systems reviewed and are negative.   Per HPI unless specifically indicated above   Allergies as of 09/26/2022       Reactions   Lipitor [atorvastatin]    Niacin    Niaspan [niacin Er]         Medication List        Accurate as of September 26, 2022  2:46 PM. If you have any questions, ask your nurse or doctor.          STOP taking these medications    fenofibrate 145 MG tablet Commonly known as: TRICOR Stopped by: Nils Pyle, MD   lisinopril 10 MG tablet Commonly known as: ZESTRIL Stopped by: Elige Radon Flora Parks, MD       TAKE these medications    cholecalciferol 1000 units tablet Commonly known as: VITAMIN D Take 1,000 Units by mouth daily.   divalproex 500 MG DR  tablet Commonly known as: Depakote Take 1 tablet (500 mg total) by mouth 2 (two) times daily.   fluticasone 50 MCG/ACT nasal spray Commonly known as: FLONASE Place 2 sprays into both nostrils daily.   methocarbamol 500 MG tablet Commonly known as: ROBAXIN TAKE 1 TABLET BY MOUTH 4 TIMES DAILY.   pantoprazole 40 MG tablet Commonly known as: PROTONIX Take 40 mg by mouth daily.   QUEtiapine 100 MG tablet Commonly known as: SEROQUEL Take one and 1/2 tablets at bedtime.         Objective:   BP 139/85   Pulse 81   Ht 5' 10.5" (1.791 m)   Wt 214 lb (97.1 kg)   SpO2 97%   BMI 30.27 kg/m   Wt Readings from Last 3 Encounters:  09/26/22 214 lb (97.1 kg)  08/09/22 218 lb 3.2 oz (99 kg)  02/26/22 225 lb (102.1 kg)    Physical Exam Vitals and nursing note reviewed.  Constitutional:      General: He is not in acute distress.    Appearance: He is well-developed. He is not diaphoretic.  Eyes:     General: No scleral icterus.    Conjunctiva/sclera: Conjunctivae normal.  Neck:     Thyroid: No thyromegaly.  Cardiovascular:     Rate and Rhythm: Normal rate and regular rhythm.     Heart sounds: Normal heart sounds. No murmur heard. Pulmonary:     Effort: Pulmonary effort is normal. No respiratory distress.     Breath sounds: Normal breath sounds. No wheezing.  Abdominal:     General: Abdomen is flat. Bowel sounds are normal. There is no distension.     Tenderness: There is no abdominal tenderness. There is no right CVA tenderness, left CVA tenderness, guarding or rebound.  Musculoskeletal:        General: Normal range of motion.     Cervical back: Neck supple.  Lymphadenopathy:     Cervical: No cervical adenopathy.  Skin:    General: Skin is warm and dry.     Findings: No rash.  Neurological:     Mental Status: He is alert and oriented to person, place, and time.     Coordination: Coordination normal.  Psychiatric:        Behavior: Behavior normal.        Assessment & Plan:   Problem List Items Addressed This Visit   None Visit Diagnoses     AKI (acute kidney injury) (HCC)    -  Primary   Relevant Orders   BMP8+EGFR     Denies any symptoms, will recheck his kidney function has been off the lithium for couple weeks.  I did start the Depakote in its place  Follow up plan: Return if symptoms worsen or fail to improve.  Counseling provided for all of the vaccine components Orders Placed This Encounter  Procedures   BMP8+EGFR    Arville Care, MD [facility] 09/26/2022, 2:46 PM

## 2022-09-27 LAB — BMP8+EGFR
BUN/Creatinine Ratio: 19 (ref 10–24)
BUN: 22 mg/dL (ref 8–27)
CO2: 22 mmol/L (ref 20–29)
Calcium: 10.5 mg/dL — ABNORMAL HIGH (ref 8.6–10.2)
Chloride: 108 mmol/L — ABNORMAL HIGH (ref 96–106)
Creatinine, Ser: 1.16 mg/dL (ref 0.76–1.27)
Glucose: 93 mg/dL (ref 70–99)
Potassium: 4.7 mmol/L (ref 3.5–5.2)
Sodium: 144 mmol/L (ref 134–144)
eGFR: 72 mL/min/{1.73_m2} (ref >59)

## 2022-09-30 DIAGNOSIS — F439 Reaction to severe stress, unspecified: Secondary | ICD-10-CM | POA: Diagnosis not present

## 2022-10-07 ENCOUNTER — Telehealth: Payer: Self-pay | Admitting: Family Medicine

## 2022-10-07 NOTE — Telephone Encounter (Signed)
  Prescription Request  10/07/2022   What is the name of the medication or equipment? NEUROTIN  Have you contacted your pharmacy to request a refill? YES  Which pharmacy would you like this sent to? [facility] [place]  Pt says he recently had surgery and was put back on this medication but is having a hard time getting it filled since the surgery doctor was the one to initially re-prescribe it to him. He is requesting that PCP or Dr Louanne Skye (who he had an appt with on 09/26/22) send Rx to pharmacy for him.    Patient notified that their request is being sent to the clinical staff for review and that they should receive a response within 2 business days.

## 2022-10-10 NOTE — Telephone Encounter (Signed)
It isn't listed in his chart. (His surgeon may not have used Epic) . To refill I need his most recent dose. If it has been over 6 weeks then he should start over and titrate the dose .

## 2022-10-10 NOTE — Telephone Encounter (Signed)
Returned call and patient reports he already picked up the medication

## 2022-10-11 ENCOUNTER — Other Ambulatory Visit: Payer: Self-pay | Admitting: Family Medicine

## 2022-10-11 DIAGNOSIS — G4700 Insomnia, unspecified: Secondary | ICD-10-CM

## 2022-10-11 DIAGNOSIS — F319 Bipolar disorder, unspecified: Secondary | ICD-10-CM

## 2022-10-12 ENCOUNTER — Telehealth: Payer: Self-pay | Admitting: Adult Health

## 2022-10-12 NOTE — Telephone Encounter (Signed)
Next visit is 12/21/22. Requesting refill on Seroquel 100 mg called to:  [id number] [place],  - [address]   Phone: [phone]  Fax: [phone]

## 2022-10-12 NOTE — Telephone Encounter (Signed)
Patient does have a RF available. Pharmacy will fill today. Patient notified.

## 2022-10-15 ENCOUNTER — Other Ambulatory Visit: Payer: Self-pay | Admitting: Family Medicine

## 2022-10-15 DIAGNOSIS — I10 Essential (primary) hypertension: Secondary | ICD-10-CM

## 2022-10-17 NOTE — Telephone Encounter (Signed)
Pt says that he has been on this medication for years. He wants to know it has been d/c. Please call back

## 2022-10-18 NOTE — Telephone Encounter (Signed)
Pt returned missed call. Wants to know why Dr Darlyn Read discontinued the medications, especially the lisinopril. Says he wasn't aware and also says this medication has really helped him with his BP.   Please advise.

## 2022-10-18 NOTE — Telephone Encounter (Signed)
Left detailed message on VM that this message was DCd at visit on 09/26/22 along w/ Fenofibrate, to call us back if he had any further questions.

## 2022-10-21 ENCOUNTER — Telehealth: Payer: Self-pay | Admitting: Family Medicine

## 2022-10-21 NOTE — Telephone Encounter (Signed)
  Prescription Request  10/21/2022  Is this a "Controlled Substance" medicine? no  Have you seen your PCP in the last 2 weeks? no  If YES, route message to pool  -  If NO, patient needs to be scheduled for appointment.  What is the name of the medication or equipment? Lisinopril 10 mg - Patient has apt 7-15 and needs enough to get him through  Have you contacted your pharmacy to request a refill? YES   Which [facility] [place]   Patient notified that their request is being sent to the clinical staff for review and that they should receive a response within 2 business days.

## 2022-10-21 NOTE — Telephone Encounter (Signed)
Has patient been taking the lisinopril 10 mg still, (Yes every day for the last several years )  if he has been taking it how his blood pressure been running?   (Very Good , this am it was 122/80) Has he been having any lightheadedness or dizziness? (None )

## 2022-10-21 NOTE — Telephone Encounter (Signed)
Has patient been taking the lisinopril 10 mg still, if he has been taking it how his blood pressure been running?  Has he been having any lightheadedness or dizziness? Please let me know and then we can decide on the lisinopril.

## 2022-10-21 NOTE — Telephone Encounter (Signed)
Patient had office visit with you on 09/26/22, looks like this medication was stopped at that visit.

## 2022-10-24 ENCOUNTER — Other Ambulatory Visit: Payer: Self-pay | Admitting: Family Medicine

## 2022-10-24 ENCOUNTER — Other Ambulatory Visit: Payer: Self-pay | Admitting: *Deleted

## 2022-10-24 DIAGNOSIS — N179 Acute kidney failure, unspecified: Secondary | ICD-10-CM

## 2022-10-24 DIAGNOSIS — I10 Essential (primary) hypertension: Secondary | ICD-10-CM

## 2022-10-24 MED ORDER — LISINOPRIL 10 MG PO TABS: 10.0000 mg | ORAL_TABLET | Freq: Every day | ORAL | 3 refills | Status: DC

## 2022-10-24 NOTE — Telephone Encounter (Signed)
PATIENT AWARE AND FOLLOW UP LAB ORDERED

## 2022-10-24 NOTE — Telephone Encounter (Signed)
I sent in the refill for lisinopril. Since his kidney function was weak when it was Dced last month, I recommend he drop by, lab only, in about 2 weeks after restarting the medication to recheck kidney function with a BMP.

## 2022-10-31 ENCOUNTER — Ambulatory Visit: Payer: BC Managed Care – PPO | Admitting: Family Medicine

## 2022-10-31 ENCOUNTER — Encounter: Payer: Self-pay | Admitting: Family Medicine

## 2022-10-31 VITALS — BP 135/86 | HR 76 | Temp 98.2°F | Ht 70.5 in | Wt 215.4 lb

## 2022-10-31 DIAGNOSIS — M5430 Sciatica, unspecified side: Secondary | ICD-10-CM

## 2022-10-31 DIAGNOSIS — I10 Essential (primary) hypertension: Secondary | ICD-10-CM

## 2022-10-31 DIAGNOSIS — K2100 Gastro-esophageal reflux disease with esophagitis, without bleeding: Secondary | ICD-10-CM | POA: Diagnosis not present

## 2022-10-31 DIAGNOSIS — F319 Bipolar disorder, unspecified: Secondary | ICD-10-CM | POA: Diagnosis not present

## 2022-10-31 DIAGNOSIS — M549 Dorsalgia, unspecified: Secondary | ICD-10-CM

## 2022-10-31 MED ORDER — METHOCARBAMOL 500 MG PO TABS: 500.0000 mg | ORAL_TABLET | Freq: Four times a day (QID) | ORAL | 1 refills | Status: DC

## 2022-10-31 MED ORDER — PANTOPRAZOLE SODIUM 40 MG PO TBEC: 40.0000 mg | DELAYED_RELEASE_TABLET | Freq: Every day | ORAL | 3 refills | Status: DC

## 2022-10-31 MED ORDER — DIVALPROEX SODIUM 500 MG PO DR TAB: 500.0000 mg | DELAYED_RELEASE_TABLET | Freq: Two times a day (BID) | ORAL | 3 refills | Status: DC

## 2022-10-31 NOTE — Progress Notes (Signed)
Subjective:  Patient ID: Melvin Roberts, male    DOB: 1960/06/22  Age: 62 y.o. MRN: [id number]  CC: Medical Management of Chronic Issues   HPI Melvin Roberts presents for  presents for  follow-up of hypertension. Patient has no history of headache chest pain or shortness of breath or recent cough. Patient also denies symptoms of TIA such as focal numbness or weakness. Patient denies side effects from medication. States taking it regularly. Pt. Also treated for bipolar dx. Taking quetiapine and depakote.   Patient in for follow-up of GERD. Currently asymptomatic taking  PPI daily. There is no chest pain or heartburn. No hematemesis and no melena. No dysphagia or choking. Onset is remote. Progression is stable. Complicating factors, none.       10/31/2022    1:16 PM 10/31/2022   12:50 PM 09/26/2022    2:23 PM  Depression screen PHQ 2/9  Decreased Interest 1 0 1  Down, Depressed, Hopeless 0 0 0  PHQ - 2 Score 1 0 1  Altered sleeping 1  2  Tired, decreased energy 1  0  Change in appetite 0  0  Feeling bad or failure about yourself  0  0  Trouble concentrating 0  1  Moving slowly or fidgety/restless 0  0  Suicidal thoughts 0  0  PHQ-9 Score 3  4  Difficult doing work/chores Not difficult at all  Not difficult at all    History Melvin Roberts has a past medical history of Bipolar 1 disorder (HCC), Hyperlipidemia, and Hypertension.   He has a past surgical history that includes Back surgery (11/17/95); Hernia repair (2009); Eye surgery; and Upper gastrointestinal endoscopy (x2).   His family history includes Cancer in his father, maternal grandfather, mother, and paternal grandfather.He reports that he has never smoked. He has never used smokeless tobacco. He reports that he does not drink alcohol and does not use drugs.    ROS Review of Systems  Constitutional:  Negative for fever.  Respiratory:  Negative for shortness of breath.   Cardiovascular:  Negative for chest pain.   Musculoskeletal:  Positive for back pain (intermittent. responds to methocarbamol, prn.). Negative for arthralgias.  Skin:  Negative for rash.    Objective:  BP 135/86   Pulse 76   Temp 98.2 F (36.8 C)   Ht 5' 10.5" (1.791 m)   Wt 215 lb 6.4 oz (97.7 kg)   SpO2 95%   BMI 30.47 kg/m   BP Readings from Last 3 Encounters:  10/31/22 135/86  09/26/22 139/85  08/09/22 120/80    Wt Readings from Last 3 Encounters:  10/31/22 215 lb 6.4 oz (97.7 kg)  09/26/22 214 lb (97.1 kg)  08/09/22 218 lb 3.2 oz (99 kg)     Physical Exam Vitals reviewed.  Constitutional:      Appearance: He is well-developed.  HENT:     Head: Normocephalic and atraumatic.     Right Ear: External ear normal.     Left Ear: External ear normal.     Mouth/Throat:     Pharynx: No oropharyngeal exudate or posterior oropharyngeal erythema.  Eyes:     Pupils: Pupils are equal, round, and reactive to light.  Cardiovascular:     Rate and Rhythm: Normal rate and regular rhythm.     Heart sounds: No murmur heard. Pulmonary:     Effort: No respiratory distress.     Breath sounds: Normal breath sounds.  Musculoskeletal:     Cervical back: Normal  range of motion and neck supple.  Neurological:     Mental Status: He is alert and oriented to person, place, and time.       Assessment & Plan:   Melvin "[name]" was seen today for medical management of chronic issues.  Diagnoses and all orders for this visit:  Primary hypertension  Bipolar I disorder (HCC) -     divalproex (DEPAKOTE) 500 MG DR tablet; Take 1 tablet (500 mg total) by mouth 2 (two) times daily.  Back pain with sciatica -     methocarbamol (ROBAXIN) 500 MG tablet; Take 1 tablet (500 mg total) by mouth 4 (four) times daily.  Gastroesophageal reflux disease with esophagitis without hemorrhage -     pantoprazole (PROTONIX) 40 MG tablet; Take 1 tablet (40 mg total) by mouth daily.       I have discontinued Melvin Roberts "[name]"'s  lisinopril. I have also changed his methocarbamol and pantoprazole. Additionally, I am having him maintain his cholecalciferol, fluticasone, QUEtiapine, and divalproex.  Allergies as of 10/31/2022       Reactions   Lipitor [atorvastatin]    Niacin    Niaspan [niacin Er]         Medication List        Accurate as of October 31, 2022  9:01 PM. If you have any questions, ask your nurse or doctor.          STOP taking these medications    lisinopril 10 MG tablet Commonly known as: ZESTRIL Stopped by: Melvin Roberts       TAKE these medications    cholecalciferol 1000 units tablet Commonly known as: VITAMIN D Take 1,000 Units by mouth daily.   divalproex 500 MG DR tablet Commonly known as: Depakote Take 1 tablet (500 mg total) by mouth 2 (two) times daily.   fluticasone 50 MCG/ACT nasal spray Commonly known as: FLONASE Place 2 sprays into both nostrils daily.   methocarbamol 500 MG tablet Commonly known as: ROBAXIN Take 1 tablet (500 mg total) by mouth 4 (four) times daily.   pantoprazole 40 MG tablet Commonly known as: PROTONIX Take 1 tablet (40 mg total) by mouth daily.   QUEtiapine 100 MG tablet Commonly known as: SEROQUEL Take one and 1/2 tablets at bedtime.         Follow-up: Return in about 6 months (around 05/03/2023).  Mechele Claude, M.D.

## 2022-11-04 ENCOUNTER — Telehealth: Payer: Self-pay | Admitting: Family Medicine

## 2022-11-04 DIAGNOSIS — F439 Reaction to severe stress, unspecified: Secondary | ICD-10-CM | POA: Diagnosis not present

## 2022-11-04 NOTE — Telephone Encounter (Signed)
Spouse says that pt should be taking 4 a day--2 in the am and 2 in the pm of divalproex (DEPAKOTE) 500 MG DR tablet. The rx sent in on 10/31/2022 is wrong. Spouse is aware that Stacks is off today and will address Monday. Please call back

## 2022-11-04 NOTE — Telephone Encounter (Signed)
Defer to pcp return

## 2022-11-06 ENCOUNTER — Other Ambulatory Visit: Payer: Self-pay | Admitting: Family Medicine

## 2022-11-06 DIAGNOSIS — F319 Bipolar disorder, unspecified: Secondary | ICD-10-CM

## 2022-11-06 MED ORDER — DIVALPROEX SODIUM 500 MG PO DR TAB: 1000.0000 mg | DELAYED_RELEASE_TABLET | Freq: Two times a day (BID) | ORAL | 3 refills | Status: DC

## 2022-11-06 NOTE — Telephone Encounter (Signed)
Please let the patient know that I sent their prescription to their pharmacy. Thanks, WS 

## 2022-11-07 NOTE — Telephone Encounter (Signed)
Pt aware.

## 2022-11-07 NOTE — Telephone Encounter (Signed)
Lmtcb.

## 2022-12-07 ENCOUNTER — Ambulatory Visit: Payer: BC Managed Care – PPO

## 2022-12-07 DIAGNOSIS — M25661 Stiffness of right knee, not elsewhere classified: Secondary | ICD-10-CM | POA: Diagnosis not present

## 2022-12-08 ENCOUNTER — Ambulatory Visit: Payer: BC Managed Care – PPO | Admitting: Nurse Practitioner

## 2022-12-08 ENCOUNTER — Encounter: Payer: Self-pay | Admitting: Nurse Practitioner

## 2022-12-08 VITALS — BP 151/97 | HR 63 | Temp 97.5°F | Resp 20 | Ht 70.0 in | Wt 214.0 lb

## 2022-12-08 DIAGNOSIS — H9193 Unspecified hearing loss, bilateral: Secondary | ICD-10-CM

## 2022-12-08 NOTE — Patient Instructions (Signed)
Hearing Loss Hearing loss is a partial or total loss of the ability to hear. This can be temporary or permanent, and it can happen in one or both ears. There are two types of hearing loss. You can have just one type or both types. You may have a problem with: Damage to your hearing nerves (sensorineural hearing loss). This type of hearing loss is more likely to be permanent. A hearing aid is often the best treatment. Sound getting to your inner ear (conductive hearing loss). This type of hearing loss can usually be treated medically or surgically. Medical care is necessary to treat hearing loss properly and to prevent the condition from getting worse. Your hearing may partially or completely come back, depending on what caused your hearing loss and how severe it is. In some cases, hearing loss is permanent. What are the causes? Common causes of hearing loss include: Too much wax in the ear canal. Infection of the ear canal or middle ear. Fluid in the middle ear. Injury to the ear or surrounding area. An object stuck in the ear. A history of prolonged exposure to loud sounds, such as music. Less common causes of hearing loss include: Tumors in the ear. Viral or bacterial infections, such as meningitis. A hole in the eardrum (perforated eardrum). Problems with the hearing nerve that sends signals between the brain and the ear. Certain medicines. What are the signs or symptoms? Symptoms of this condition may include: Difficulty telling the difference between sounds. Difficulty following a conversation when there is background noise. Lack of response to sounds in your environment. This may be most noticeable when you do not respond to startling sounds. Needing to turn up the volume on the television, radio, or other devices. Ringing in the ears. Dizziness. How is this diagnosed? This condition is diagnosed based on: A physical exam. A hearing test (audiometry). The test will be performed  by a hearing specialist (audiologist). Imaging tests, such as an MRI or CT scan. You may also be referred to an ear, nose, and throat (ENT) specialist (otolaryngologist). How is this treated? Treatment for hearing loss may include: Earwax removal. Medicines to treat or prevent infection (antibiotics). Medicines to reduce inflammation (corticosteroids). Hearing aids or assistive listening devices. Follow these instructions at home: If you were prescribed an antibiotic medicine, take it as told by your health care provider. Do not stop taking the antibiotic even if you start to feel better. Take over-the-counter and prescription medicines only as told by your health care provider. Avoid loud noises. Use hearing protection if you are exposed to loud noises or work in a noisy environment. Return to your normal activities as told by your health care provider. Ask your health care provider what activities are safe for you. Keep all follow-up visits. This is important. Contact a health care provider if: You feel dizzy. You develop new symptoms. You vomit or feel nauseous. You have a fever. Get help right away if: You develop sudden changes in your vision. You have severe ear pain. You have new or increased weakness. You have a severe headache. Summary Hearing loss is a decreased ability to hear sounds around you. It can be temporary or permanent. Treatment will depend on the cause of your hearing loss. It may include earwax removal, medicines, or a hearing aid. Your hearing may partially or completely come back, depending on what caused your hearing loss and how severe it is. Keep all follow-up visits. This is important. This information is  not intended to replace advice given to you by your health care provider. Make sure you discuss any questions you have with your health care provider. Document Revised: 02/11/2021 Document Reviewed: 02/11/2021 Elsevier Patient Education  2024 [employer].

## 2022-12-08 NOTE — Progress Notes (Signed)
   Subjective:    Patient ID: Melvin Roberts, male    DOB: 12/15/1960, 62 y.o.   MRN: [id number]   Chief Complaint: Wants ears checked   HPI  Patient says his hearing sounds like he is in a fish bowl. He has had issues with ear wax years ago.  Patient Active Problem List   Diagnosis Date Noted   Upper respiratory tract infection 08/17/2020   Vitamin D deficiency 04/15/2019   BMI 32.0-32.9,adult 04/15/2019   Hypertension 09/16/2013   Bipolar disorder (HCC) 03/11/2013   Allergic rhinitis 03/11/2013   BPH (benign prostatic hyperplasia) 03/11/2013   Hyperlipidemia 05/29/2007   Depression, recurrent (HCC) 05/29/2007   Sinusitis, chronic 05/29/2007   ESOPHAGEAL STRICTURE 05/29/2007   GERD 05/29/2007   HIATAL HERNIA 05/29/2007   LOW BACK PAIN, CHRONIC 05/29/2007       Review of Systems  Constitutional:  Negative for diaphoresis.  HENT:  Negative for ear discharge and ear pain.   Eyes:  Negative for pain.  Respiratory:  Negative for shortness of breath.   Cardiovascular:  Negative for chest pain, palpitations and leg swelling.  Gastrointestinal:  Negative for abdominal pain.  Endocrine: Negative for polydipsia.  Skin:  Negative for rash.  Neurological:  Negative for dizziness, weakness and headaches.  Hematological:  Does not bruise/bleed easily.  All other systems reviewed and are negative.      Objective:   Physical Exam Constitutional:      Appearance: Normal appearance.  HENT:     Right Ear: Tympanic membrane normal. There is no impacted cerumen.     Left Ear: Tympanic membrane normal. There is no impacted cerumen.  Cardiovascular:     Rate and Rhythm: Normal rate and regular rhythm.     Heart sounds: Normal heart sounds.  Pulmonary:     Effort: Pulmonary effort is normal.     Breath sounds: Normal breath sounds.  Skin:    General: Skin is warm.  Neurological:     General: No focal deficit present.     Mental Status: He is alert and oriented to  person, place, and time.  Psychiatric:        Mood and Affect: Mood normal.        Behavior: Behavior normal.    BP (!) 151/97   Pulse 63   Temp (!) 97.5 F (36.4 C) (Temporal)   Resp 20   Ht 5\' 10"  (1.778 m)   Wt 214 lb (97.1 kg)   SpO2 97%   BMI 30.71 kg/m         Assessment & Plan:  [name] in today with chief complaint of Wants ears checked   1. Bilateral hearing loss, unspecified hearing loss type Referral to audiology    The above assessment and management plan was discussed with the patient. The patient verbalized understanding of and has agreed to the management plan. Patient is aware to call the clinic if symptoms persist or worsen. Patient is aware when to return to the clinic for a follow-up visit. Patient educated on when it is appropriate to go to the emergency department.   Mary-Margaret Daphine Deutscher, FNP

## 2022-12-09 DIAGNOSIS — F439 Reaction to severe stress, unspecified: Secondary | ICD-10-CM | POA: Diagnosis not present

## 2022-12-13 ENCOUNTER — Encounter: Payer: Self-pay | Admitting: Family Medicine

## 2022-12-13 ENCOUNTER — Ambulatory Visit: Payer: BC Managed Care – PPO | Admitting: Family Medicine

## 2022-12-13 VITALS — BP 124/82 | HR 74 | Temp 98.1°F | Ht 70.0 in | Wt 210.6 lb

## 2022-12-13 DIAGNOSIS — Z23 Encounter for immunization: Secondary | ICD-10-CM | POA: Diagnosis not present

## 2022-12-13 DIAGNOSIS — R251 Tremor, unspecified: Secondary | ICD-10-CM

## 2022-12-13 NOTE — Progress Notes (Signed)
Subjective:  Patient ID: Melvin Roberts, male    DOB: 12-Dec-1960  Age: 62 y.o. MRN: [id number]  CC: Tremors   HPI Melvin Roberts presents for Worsening tremor. Affects both hands. Getting worse for several months. Also has been taking seroquel for several months. Tremor is not a rest - only tremor, but is intermittent. Getting more frequent and more pronounced.Difficult to do fine motor activities including writing.      12/13/2022    9:26 AM 12/13/2022    9:16 AM 12/08/2022    1:57 PM  Depression screen PHQ 2/9  Decreased Interest 1 0 1  Down, Depressed, Hopeless 1 0 1  PHQ - 2 Score 2 0 2  Altered sleeping 1  1  Tired, decreased energy 1  1  Change in appetite 0  0  Feeling bad or failure about yourself  0  1  Trouble concentrating 1  1  Moving slowly or fidgety/restless 0  0  Suicidal thoughts 0  0  PHQ-9 Score 5  6  Difficult doing work/chores Somewhat difficult  Not difficult at all    History Melvin Roberts has a past medical history of Bipolar 1 disorder (HCC), Hyperlipidemia, and Hypertension.   He has a past surgical history that includes Back surgery (11/17/95); Hernia repair (2009); Eye surgery; and Upper gastrointestinal endoscopy (x2).   His family history includes Cancer in his father, maternal grandfather, mother, and paternal grandfather.He reports that he has never smoked. He has never used smokeless tobacco. He reports that he does not drink alcohol and does not use drugs.    ROS Review of Systems  Objective:  BP 124/82   Pulse 74   Temp 98.1 F (36.7 C)   Ht 5\' 10"  (1.778 m)   Wt 210 lb 9.6 oz (95.5 kg)   SpO2 96%   BMI 30.22 kg/m   BP Readings from Last 3 Encounters:  12/13/22 124/82  12/08/22 (!) 151/97  10/31/22 135/86    Wt Readings from Last 3 Encounters:  12/13/22 210 lb 9.6 oz (95.5 kg)  12/08/22 214 lb (97.1 kg)  10/31/22 215 lb 6.4 oz (97.7 kg)     Physical Exam Vitals reviewed.  Constitutional:      Appearance: He is  well-developed.  HENT:     Head: Normocephalic and atraumatic.     Right Ear: External ear normal.     Left Ear: External ear normal.     Mouth/Throat:     Pharynx: No oropharyngeal exudate or posterior oropharyngeal erythema.  Eyes:     Pupils: Pupils are equal, round, and reactive to light.  Cardiovascular:     Rate and Rhythm: Normal rate and regular rhythm.     Heart sounds: No murmur heard. Pulmonary:     Effort: No respiratory distress.     Breath sounds: Normal breath sounds.  Musculoskeletal:     Cervical back: Normal range of motion and neck supple.  Neurological:     Mental Status: He is alert and oriented to person, place, and time.     Motor: No weakness.     Comments: Fine tremor of both hands noted. Somewhat worse as he focuses on it. Not relieved by motion. No cogwheeling. No stiffness. Glabellar sign is negative.        Assessment & Plan:   Melvin "[name]" was seen today for tremors.  Diagnoses and all orders for this visit:  Need for shingles vaccine -     Zoster Recombinant (Shingrix )  Tremor of both hands   Taper off of seroquel as follows: Take 1 1/2 daily for 1 week. Then go to 1 daily for 2 weeks. Then 1/2 daily for 2 weeks. Then Discontinue the medication.  May need cogentin and/or propranolol.   I have discontinued Melvin Roberts "[name]"'s QUEtiapine. I am also having him maintain his cholecalciferol, fluticasone, methocarbamol, pantoprazole, and divalproex.  Allergies as of 12/13/2022       Reactions   Lipitor [atorvastatin]    Niacin    Niaspan [niacin Er]         Medication List        Accurate as of December 13, 2022  5:37 PM. If you have any questions, ask your nurse or doctor.          STOP taking these medications    QUEtiapine 100 MG tablet Commonly known as: SEROQUEL Stopped by: Melanee Cordial       TAKE these medications    cholecalciferol 1000 units tablet Commonly known as: VITAMIN D Take 1,000 Units by  mouth daily.   divalproex 500 MG DR tablet Commonly known as: Depakote Take 2 tablets (1,000 mg total) by mouth 2 (two) times daily.   fluticasone 50 MCG/ACT nasal spray Commonly known as: FLONASE Place 2 sprays into both nostrils daily.   methocarbamol 500 MG tablet Commonly known as: ROBAXIN Take 1 tablet (500 mg total) by mouth 4 (four) times daily.   pantoprazole 40 MG tablet Commonly known as: PROTONIX Take 1 tablet (40 mg total) by mouth daily.         Follow-up: Return in about 2 months (around 02/12/2023) for tremor and second shingles shot.  Mechele Claude, M.D.

## 2022-12-13 NOTE — Patient Instructions (Signed)
Taper off of seroquel as follows: Take 1 1/2 daily for 1 week. Then go to 1 daily for 2 weeks. Then 1/2 daily for 2 weeks. Then Discontinue the medication.

## 2022-12-21 ENCOUNTER — Encounter: Payer: Self-pay | Admitting: Adult Health

## 2022-12-21 ENCOUNTER — Ambulatory Visit (INDEPENDENT_AMBULATORY_CARE_PROVIDER_SITE_OTHER): Payer: BC Managed Care – PPO | Admitting: Adult Health

## 2022-12-21 DIAGNOSIS — G4700 Insomnia, unspecified: Secondary | ICD-10-CM

## 2022-12-21 DIAGNOSIS — F319 Bipolar disorder, unspecified: Secondary | ICD-10-CM

## 2022-12-21 MED ORDER — TRAZODONE HCL 50 MG PO TABS: 50.0000 mg | ORAL_TABLET | Freq: Every day | ORAL | 5 refills | Status: DC

## 2022-12-21 NOTE — Progress Notes (Signed)
Melvin Roberts [id number] November 14, 1960 62 y.o.  Subjective:   Patient ID:  Melvin Roberts is a 62 y.o. (DOB Oct 16, 1960) male.  Chief Complaint: No chief complaint on file.   HPI Melvin Roberts presents to the office today for follow-up of Bipolar Disorder, high risk medication use and insomnia.  Referred by PCP - Bipolar disorder medications.  Accompanied by wife - not present for interview.  Describes mood today as "ok". Pleasant. Denies tearfulness. Mood symptoms - reporting irritability. Denies depression and anxiety. Reports worry, rumination, and over thinking. Mood has improved. Stating "I feel a lot calmer". Feels like the Depakote and Seroquel are working well for him. He and wife doing well. Reports ongoing concerns about son's mental health. Stable interest and motivation. Taking medications as prescribed. Energy levels stable. Active, does not have a regular exercise routine - knee replacement.  Enjoys some usual interests and activities. Married. Lives with wife. Has 5 children and 11 grandchildren. Spending time with family. Appetite adequate. Weight loss - 210 from 220 pounds. Reports sleeping difficulties. Averages 4 to 5 hours - pain related Focus and concentration difficultness - getting hyper-focused. Completing tasks. Managing aspects of household. Works full time as an Retail banker - out of work. Denies SI or HI.  Denies AH or VH. Denies self harm. Denies substance use.  Previous medication trials: Lithium, Seroquel   GAD-7    Flowsheet Row Office Visit from 12/13/2022 in [facility] Office Visit from 12/08/2022 in [facility] Office Visit from 10/31/2022 in [facility] Office Visit from 09/26/2022 in [facility] Office Visit from 08/09/2022 in [facility]  Total GAD-7 Score 5  5 4 3 4       PHQ2-9    Flowsheet Row Office Visit from 12/13/2022 in [facility] Office Visit from 12/08/2022 in [facility] Office Visit from 10[facility]/2022 [facility] Office Visit from 09/26/2022 in [facility] Office Visit from 08/09/2022 in [facility]  PHQ-2 Total Score 2 2 1 1 2   PHQ-9 Total Score 5 6 3 4 5       Flowsheet Row ED from 02/26/2022 in [facility] at [facility] ED from 09/20/2020 in [facility] Department at [facility]  C-SSRS RISK CATEGORY No Risk No Risk        Review of Systems:  Review of Systems  Musculoskeletal:  Negative for gait problem.  Neurological:  Negative for tremors.  Psychiatric/Behavioral:         Please refer to HPI    Medications: I have reviewed the patient's current medications.  Current Outpatient Medications  Medication Sig Dispense Refill   cholecalciferol (VITAMIN D) 1000 UNITS tablet Take 1,000 Units by mouth daily.     divalproex (DEPAKOTE) 500 MG DR tablet Take 2 tablets (1,000 mg total) by mouth 2 (two) times daily. 360 tablet 3   fluticasone (FLONASE) 50 MCG/ACT nasal spray Place 2 sprays into both nostrils daily. 16 g 0   methocarbamol (ROBAXIN) 500 MG tablet Take 1 tablet (500 mg total) by mouth 4 (four) times daily. 120 tablet 1   pantoprazole (PROTONIX) 40 MG tablet Take 1 tablet (40 mg total) by mouth daily. 90 tablet 3   No current facility-administered medications for this visit.  Medication Side Effects: None  Allergies:  Allergies  Allergen Reactions   Lipitor [Atorvastatin]    Niacin    Niaspan [Niacin Er]     Past Medical History:  Diagnosis Date   Bipolar 1 disorder (HCC)    Hyperlipidemia    Hypertension     Past Medical History, Surgical history, Social history, and Family history  were reviewed and updated as appropriate.   Please see review of systems for further details on the patient's review from today.   Objective:   Physical Exam:  There were no vitals taken for this visit.  Physical Exam Constitutional:      General: He is not in acute distress. Musculoskeletal:        General: No deformity.  Neurological:     Mental Status: He is alert and oriented to person, place, and time.     Coordination: Coordination normal.  Psychiatric:        Attention and Perception: Attention and perception normal. He does not perceive auditory or visual hallucinations.        Mood and Affect: Affect is not labile, blunt, angry or inappropriate.        Speech: Speech normal.        Behavior: Behavior normal.        Thought Content: Thought content normal. Thought content is not paranoid or delusional. Thought content does not include homicidal or suicidal ideation. Thought content does not include homicidal or suicidal plan.        Cognition and Memory: Cognition and memory normal.        Judgment: Judgment normal.     Comments: Insight intact     Lab Review:     Component Value Date/Time   NA 144 09/26/2022 1447   K 4.7 09/26/2022 1447   CL 108 (H) 09/26/2022 1447   CO2 22 09/26/2022 1447   GLUCOSE 93 09/26/2022 1447   GLUCOSE 117 (H) 02/26/2022 2205   BUN 22 09/26/2022 1447   CREATININE 1.16 09/26/2022 1447   CALCIUM 10.5 (H) 09/26/2022 1447   PROT 6.6 09/22/2022 0935   ALBUMIN 4.2 09/22/2022 0935   AST 21 09/22/2022 0935   ALT 15 09/22/2022 0935   ALKPHOS 57 09/22/2022 0935   BILITOT <0.2 09/22/2022 0935   GFRNONAA >60 02/26/2022 2205   GFRAA 61 05/29/2020 0854       Component Value Date/Time   WBC 9.1 02/26/2022 2205   RBC 4.00 (L) 02/26/2022 2205   HGB 12.4 (L) 02/26/2022 2205   HGB 12.8 (L) 01/27/2022 1529   HCT 37.3 (L) 02/26/2022 2205   HCT 37.3 (L) 01/27/2022 1529   PLT 241 02/26/2022 2205   PLT 303 01/27/2022 1529   MCV 93.3 02/26/2022  2205   MCV 91 01/27/2022 1529   MCH 31.0 02/26/2022 2205   MCHC 33.2 02/26/2022 2205   RDW 15.7 (H) 02/26/2022 2205   RDW 14.3 01/27/2022 1529   LYMPHSABS 2.0 02/26/2022 2205   LYMPHSABS 2.2 01/27/2022 1529   MONOABS 0.6 02/26/2022 2205   EOSABS 0.1 02/26/2022 2205   EOSABS 0.1 01/27/2022 1529   BASOSABS 0.0 02/26/2022 2205   BASOSABS 0.0 01/27/2022 1529    Lithium Lvl  Date Value Ref Range Status  06/09/2022 1.3 (HH) 0.5 - 1.2 mmol/L Final    Comment:    A concentration of 0.5-0.8 mmol/L is advised for long-term use; concentrations of up to 1.2 mmol/L may be necessary during acute treatment.  Detection Limit = 0.1                           <0.1 indicates None Detected Patient drug level exceeds published reference range.  Evaluate clinically for signs of potential toxicity.      No results found for: "PHENYTOIN", "PHENOBARB", "VALPROATE", "CBMZ"   .res Assessment: Plan:    Plan:  PDMP reviewed  D/C Seroquel 150mg  at hs per PCP Add Trazadone 50mg  at hs  Depakote 500mg  BID to 1000 BID per PCP.   RTC 3 months  Time spent with patient was 25 minutes. Greater than 50% of face to face time with patient was spent on counseling and coordination of care.   Discussed potential metabolic side effects associated with atypical antipsychotics, as well as potential risk for movement side effects. Advised pt to contact office if movement side effects occur.    Patient advised to contact office with any questions, adverse effects, or acute worsening in signs and symptoms.  There are no diagnoses linked to this encounter.   Please see After Visit Summary for patient specific instructions.  Future Appointments  Date Time Provider Department Center  12/21/2022  4:20 PM Kiernan Atkerson, Thereasa Solo, NP [facility] None  01/09/2023  4:00 PM Barrie Folk, AUD [facility] None  02/07/2023  3:25 PM Mechele Claude, MD [facility] None  05/04/2023  3:25 PM Mechele Claude, MD [facility] None    No orders of the defined types were placed in this encounter.   -------------------------------

## 2022-12-30 DIAGNOSIS — M7042 Prepatellar bursitis, left knee: Secondary | ICD-10-CM | POA: Diagnosis not present

## 2022-12-30 DIAGNOSIS — M25561 Pain in right knee: Secondary | ICD-10-CM | POA: Diagnosis not present

## 2023-01-03 DIAGNOSIS — M1711 Unilateral primary osteoarthritis, right knee: Secondary | ICD-10-CM | POA: Diagnosis not present

## 2023-01-04 ENCOUNTER — Ambulatory Visit: Payer: BC Managed Care – PPO | Admitting: Audiologist

## 2023-01-04 DIAGNOSIS — M1711 Unilateral primary osteoarthritis, right knee: Secondary | ICD-10-CM | POA: Diagnosis not present

## 2023-01-05 DIAGNOSIS — M1711 Unilateral primary osteoarthritis, right knee: Secondary | ICD-10-CM | POA: Diagnosis not present

## 2023-01-06 DIAGNOSIS — M1711 Unilateral primary osteoarthritis, right knee: Secondary | ICD-10-CM | POA: Diagnosis not present

## 2023-01-09 ENCOUNTER — Ambulatory Visit: Payer: BC Managed Care – PPO | Admitting: Audiologist

## 2023-01-11 ENCOUNTER — Telehealth: Payer: Self-pay | Admitting: Adult Health

## 2023-01-11 DIAGNOSIS — M1711 Unilateral primary osteoarthritis, right knee: Secondary | ICD-10-CM | POA: Diagnosis not present

## 2023-01-11 NOTE — Telephone Encounter (Signed)
Patient's spouse [name] lvm requesting a return call to discuss some concerns with [name]. Patient is soon to return to work, so she would like to entertain these issues before he returns.  Appointment 03/22/23 [name]'s # [phone]

## 2023-01-12 NOTE — Telephone Encounter (Addendum)
Please call and schedule an earlier appt.  Almira Coaster said he needs 40 min appt.

## 2023-01-12 NOTE — Telephone Encounter (Signed)
FYI:  Wife reporting patient having memory issues, gets easily agitated, and anxiety level is high. Patient is currently out of work due to knee surgery and wife would like to try to resolve issues while he is out. She also reports tremors in his hands, especially in the right one.  Wife asking if his memory can be tested. She doesn't know if medication can be contributing or there is another issue. He is only on Depakote and trazodone recently started. She reports the trazodone helps, but he still is not sleeping well.  He is currently scheduled for FU in December, will ask admin to call and schedule an earlier appt.

## 2023-01-13 ENCOUNTER — Encounter: Payer: Self-pay | Admitting: Family Medicine

## 2023-01-13 ENCOUNTER — Ambulatory Visit: Payer: BC Managed Care – PPO | Admitting: Family Medicine

## 2023-01-13 ENCOUNTER — Other Ambulatory Visit: Payer: Self-pay | Admitting: Adult Health

## 2023-01-13 VITALS — BP 119/77 | HR 62 | Temp 98.1°F | Ht 70.0 in | Wt 212.2 lb

## 2023-01-13 DIAGNOSIS — F439 Reaction to severe stress, unspecified: Secondary | ICD-10-CM | POA: Diagnosis not present

## 2023-01-13 DIAGNOSIS — J0110 Acute frontal sinusitis, unspecified: Secondary | ICD-10-CM

## 2023-01-13 DIAGNOSIS — G4700 Insomnia, unspecified: Secondary | ICD-10-CM

## 2023-01-13 MED ORDER — FLUTICASONE PROPIONATE 50 MCG/ACT NA SUSP
2.0000 | Freq: Every day | NASAL | 0 refills | Status: DC
Start: 2023-01-13 — End: 2023-02-06

## 2023-01-13 MED ORDER — PREDNISONE 10 MG (21) PO TBPK
ORAL_TABLET | ORAL | 0 refills | Status: DC
Start: 2023-01-13 — End: 2023-04-10

## 2023-01-13 MED ORDER — AMOXICILLIN-POT CLAVULANATE 875-125 MG PO TABS
1.0000 | ORAL_TABLET | Freq: Two times a day (BID) | ORAL | 0 refills | Status: AC
Start: 2023-01-13 — End: 2023-01-20

## 2023-01-13 NOTE — Telephone Encounter (Signed)
Left message for pt to call to schedule earlier/ 40 min appt

## 2023-01-13 NOTE — Progress Notes (Unsigned)
Acute Office Visit  Subjective:     Patient ID: Melvin Roberts, male    DOB: 02-13-1961, 62 y.o.   MRN: [id number]  Chief Complaint  Patient presents with   Nasal Congestion    Cough This is a new problem. Episode onset: 5 days. The problem has been gradually worsening. The problem occurs hourly. The cough is Non-productive. Associated symptoms include headaches and nasal congestion. Pertinent negatives include no chest pain, chills, ear congestion, ear pain, fever, sore throat, shortness of breath or wheezing. Nothing aggravates the symptoms. He has tried OTC cough suppressant for the symptoms. The treatment provided mild relief. His past medical history is significant for pneumonia. There is no history of asthma, bronchitis or COPD.   Patient is in today for ***  Review of Systems  Constitutional:  Negative for chills and fever.  HENT:  Negative for ear pain and sore throat.   Respiratory:  Positive for cough. Negative for shortness of breath and wheezing.   Cardiovascular:  Negative for chest pain.  Neurological:  Positive for headaches.        Objective:    BP 119/77   Pulse 62   Temp 98.1 F (36.7 C) (Temporal)   Ht 5\' 10"  (1.778 m)   Wt 212 lb 4 oz (96.3 kg)   SpO2 98%   BMI 30.45 kg/m  {Vitals History (Optional):23777}  Physical Exam  No results found for any visits on 01/13/23.      Assessment & Plan:   Problem List Items Addressed This Visit   None   No orders of the defined types were placed in this encounter.   No follow-ups on file.  Gabriel Earing, FNP

## 2023-01-16 ENCOUNTER — Encounter: Payer: Self-pay | Admitting: Family Medicine

## 2023-01-16 DIAGNOSIS — M1711 Unilateral primary osteoarthritis, right knee: Secondary | ICD-10-CM | POA: Diagnosis not present

## 2023-01-16 NOTE — Telephone Encounter (Signed)
It looks like patient's appt was moved from December, but he was supposed to be scheduled for a 40 minute appt. Please call to reschedule.

## 2023-01-17 ENCOUNTER — Ambulatory Visit: Payer: BC Managed Care – PPO | Attending: Nurse Practitioner | Admitting: Audiologist

## 2023-01-17 DIAGNOSIS — H903 Sensorineural hearing loss, bilateral: Secondary | ICD-10-CM | POA: Diagnosis not present

## 2023-01-17 DIAGNOSIS — M1711 Unilateral primary osteoarthritis, right knee: Secondary | ICD-10-CM | POA: Diagnosis not present

## 2023-01-17 NOTE — Procedures (Signed)
Outpatient Audiology and [facility] [address] [place], [place]  [address] [phone]  AUDIOLOGICAL  EVALUATION  NAME: Melvin Roberts     DOB:   October 21, 1960      MRN: [id number]                                                                                     DATE: 01/17/2023     REFERENT: Mechele Claude, MD STATUS: Outpatient DIAGNOSIS: Noise Induced Sensorineural Hearing Loss bilateral   History: Melvin Roberts was seen for an audiological evaluation.  Melvin Roberts is receiving a hearing evaluation due to concerns for difficulty hearing in noise. Melvin Roberts has difficulty hearing when the fan is running at home. He needs people to repeat themselves at work. He has thought it was wax but his doctor said his ears are clear. [employer] at work has told him his hearing is declining. No pain or pressure reported in either ear. Tinnitus has gotten worse in both ears since he has been off of work due to injury. 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 No other relevant case history reported.   Evaluation:  Otoscopy showed a clear view of the tympanic membranes, bilaterally Tympanometry results were consistent with normal middle ear compliance, bilaterally   Audiometric testing was completed using conventional audiometry with supraural transducer. Speech Recognition Thresholds were 25 dB in the right ear and 15 dB in the left ear. Word Recognition was performed 40dB SL, scored 96% in the right ear and 96% in the left ear. Pure tone thresholds show normal hearing sloping after 2kHz to a moderate sensorineural symmetric hearing loss. Notch present bilaterally  consistent with long term hazardous noise exposure.   Results:  The test results were reviewed with Melvin Roberts. He has a moderate high pitched noise induced hearing loss bilaterally. He may benefit from hearing aids and a trial is recommended. Melvin Roberts was given a list of local hearing aid providers.     Recommendations: Amplification trial recommended for both ears. Hearing aids can be purchased from a variety of locations. See provided list for locations in the [place] area. Have audiometric testing performed annually.    27 minutes spent testing and counseling on results.   Melvin Roberts  Audiologist, Au.D., CCC-A 01/17/2023  4:25 PM  Cc: Mechele Claude, MD

## 2023-01-18 ENCOUNTER — Ambulatory Visit: Payer: BC Managed Care – PPO | Admitting: Adult Health

## 2023-01-19 ENCOUNTER — Telehealth: Payer: Self-pay | Admitting: Family Medicine

## 2023-01-19 DIAGNOSIS — M1711 Unilateral primary osteoarthritis, right knee: Secondary | ICD-10-CM | POA: Diagnosis not present

## 2023-01-19 NOTE — Telephone Encounter (Signed)
Pt had a visit with Tiffany on 9/27 and was prescribed some medicine. Says the medicine has not helped. Says his daughter had the same symptoms and she was prescribed a Zpak and is feeling much better. Wants to know if Zpak can be prescribed to him?  Pt uses [facility] in [place].

## 2023-01-19 NOTE — Telephone Encounter (Signed)
NTBS if symptoms haven't improved.

## 2023-01-20 ENCOUNTER — Encounter: Payer: Self-pay | Admitting: Adult Health

## 2023-01-20 ENCOUNTER — Ambulatory Visit: Payer: BC Managed Care – PPO | Admitting: Adult Health

## 2023-01-20 DIAGNOSIS — G4700 Insomnia, unspecified: Secondary | ICD-10-CM

## 2023-01-20 DIAGNOSIS — F319 Bipolar disorder, unspecified: Secondary | ICD-10-CM | POA: Diagnosis not present

## 2023-01-20 MED ORDER — OLANZAPINE 2.5 MG PO TABS
2.5000 mg | ORAL_TABLET | Freq: Every day | ORAL | 2 refills | Status: DC
Start: 2023-01-20 — End: 2023-02-20

## 2023-01-20 NOTE — Telephone Encounter (Signed)
Called pt to let him know what provider advised. Pt says he thinks he is feeling a little better. Wants to see how he feels over the weekend and if no improvement, he will call office on Monday to schedule appt.

## 2023-01-20 NOTE — Progress Notes (Addendum)
Melvin Roberts [id number] [birth year] 62 y.o.  Subjective:   Patient ID:  Melvin Roberts is a 62 y.o. (DOB April 22, 1960) male.  Chief Complaint: No chief complaint on file.   HPI Melvin Roberts presents to the office today for follow-up of Bipolar Disorder, high risk medication use and insomnia.  Referred by PCP - Bipolar disorder medications.  Accompanied by wife - present for interview.  Describes mood today as "not too good" Pleasant. Denies tearfulness. Mood symptoms - reports increased depression, irritability and anxiety. Reports increased aggravation and agitation. Denies panic attacks. Reports increased worry, rumination, and over thinking. Mood is variable - "fluctuations". Stating "I feel like I'm doing ok, concerned about the future". Concerned about son - "wild card". Feels like the Depakote and Trazadone are helpful, but may need to be adjusted. He and wife doing well. Reports ongoing concerns about son's mental health. Stable interest and motivation. Taking medications as prescribed. Energy levels stable. Active, does not have a regular exercise routine - knee replacement.  Enjoys some usual interests and activities. Married. Lives with wife. Has 5 children and 11 grandchildren. Spending time with family. Appetite adequate. Weight loss - 210 from 220 pounds. Reports sleep has improved. Averages 6 hours. Focus and concentration difficultness. Completing tasks. Managing aspects of household. 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 Denies SI or HI.  Denies AH or VH. Denies self harm. Denies substance use.  Reports hearing loss - awaiting hearing aids.  Previous medication trials: Lithium, Seroquel    GAD-7    Flowsheet Row Office Visit from 12/13/2022 in [facility] Office Visit from 12/08/2022 in [facility] Office Visit from 10/31/2022 in [facility] Visit from 09/26/2022 in [facility] Visit from 08/09/2022 in [facility]  Total GAD-7 Score 5 5 4 3 4       PHQ2-9    Flowsheet Row Office Visit from 12/13/2022 in [facility] Office Visit from 12/08/2022 in [facility] Office Visit from 10/31/2022 in [facility] Office Visit from 09/26/2022 in [facility] Office Visit from 08/09/2022 in [facility]  PHQ-2 Total Score 2 2 1 1 2   PHQ-9 Total Score 5 6 3 4 5       Flowsheet Row ED from 02/26/2022 in [facility] at [facility] ED from 09/20/2020 in [facility] at [facility]  C-SSRS RISK CATEGORY No Risk No Risk        Review of Systems:  Review of Systems  Musculoskeletal:  Negative for gait problem.  Neurological:  Negative for tremors.  Psychiatric/Behavioral:         Please refer to HPI    Medications: I have reviewed the patient's current medications.  Current Outpatient Medications  Medication Sig Dispense Refill   OLANZapine (ZYPREXA) 2.5 MG tablet Take 1 tablet (2.5 mg total) by mouth at bedtime. 30 tablet 2   amoxicillin-clavulanate (AUGMENTIN) 875-125 MG tablet Take 1 tablet by mouth 2 (two) times daily for 7 days. 14 tablet 0   cholecalciferol (VITAMIN D) 1000 UNITS tablet Take 1,000 Units by mouth daily.     divalproex (DEPAKOTE) 500 MG DR tablet Take 2 tablets (1,000 mg total) by mouth 2 (two) times daily.  360 tablet 3   fluticasone (FLONASE) 50 MCG/ACT nasal spray Place 2 sprays into both nostrils daily. 16 g 0   methocarbamol (ROBAXIN) 500 MG tablet Take 1 tablet (500 mg total) by mouth 4 (four) times daily. 120 tablet 1   pantoprazole (PROTONIX) 40 MG tablet Take 1 tablet (40 mg total)  by mouth daily. 90 tablet 3   predniSONE (STERAPRED UNI-PAK 21 TAB) 10 MG (21) TBPK tablet Use as directed on back of pill pack 21 tablet 0   traZODone (DESYREL) 50 MG tablet TAKE 1 TABLET BY MOUTH EVERYDAY AT BEDTIME 30 tablet 0   No current facility-administered medications for this visit.    Medication Side Effects: None  Allergies:  Allergies  Allergen Reactions   Lipitor [Atorvastatin]    Niacin    Niaspan [Niacin Er]     Past Medical History:  Diagnosis Date   Bipolar 1 disorder (HCC)    Hyperlipidemia    Hypertension     Past Medical History, Surgical history, Social history, and Family history were reviewed and updated as appropriate.   Please see review of systems for further details on the patient's review from today.   Objective:   Physical Exam:  There were no vitals taken for this visit.  Physical Exam Constitutional:      General: He is not in acute distress. Musculoskeletal:        General: No deformity.  Neurological:     Mental Status: He is alert and oriented to person, place, and time.     Coordination: Coordination normal.  Psychiatric:        Attention and Perception: Attention and perception normal. He does not perceive auditory or visual hallucinations.        Mood and Affect: Affect is not labile, blunt, angry or inappropriate.        Speech: Speech normal.        Behavior: Behavior normal.        Thought Content: Thought content normal. Thought content is not paranoid or delusional. Thought content does not include homicidal or suicidal ideation. Thought content does not include homicidal or suicidal plan.        Cognition and Memory: Cognition and memory normal.        Judgment: Judgment normal.     Comments: Insight intact     Lab Review:     Component Value Date/Time   NA 144 09/26/2022 1447   K 4.7 09/26/2022 1447   CL 108 (H) 09/26/2022 1447   CO2 22 09/26/2022 1447   GLUCOSE 93 09/26/2022 1447   GLUCOSE 117 (H) 02/26/2022  2205   BUN 22 09/26/2022 1447   CREATININE 1.16 09/26/2022 1447   CALCIUM 10.5 (H) 09/26/2022 1447   PROT 6.6 09/22/2022 0935   ALBUMIN 4.2 09/22/2022 0935   AST 21 09/22/2022 0935   ALT 15 09/22/2022 0935   ALKPHOS 57 09/22/2022 0935   BILITOT <0.2 09/22/2022 0935   GFRNONAA >60 02/26/2022 2205   GFRAA 61 05/29/2020 0854       Component Value Date/Time   WBC 9.1 02/26/2022 2205   RBC 4.00 (L) 02/26/2022 2205   HGB 12.4 (L) 02/26/2022 2205   HGB 12.8 (L) 01/27/2022 1529   HCT 37.3 (L) 02/26/2022 2205   HCT 37.3 (L) 01/27/2022 1529   PLT 241 02/26/2022 2205   PLT 303 01/27/2022 1529   MCV 93.3 02/26/2022 2205   MCV 91 01/27/2022 1529   MCH 31.0 02/26/2022 2205  MCHC 33.2 02/26/2022 2205   RDW 15.7 (H) 02/26/2022 2205   RDW 14.3 01/27/2022 1529   LYMPHSABS 2.0 02/26/2022 2205   LYMPHSABS 2.2 01/27/2022 1529   MONOABS 0.6 02/26/2022 2205   EOSABS 0.1 02/26/2022 2205   EOSABS 0.1 01/27/2022 1529   BASOSABS 0.0 02/26/2022 2205   BASOSABS 0.0 01/27/2022 1529    Lithium Lvl  Date Value Ref Range Status  06/09/2022 1.3 (HH) 0.5 - 1.2 mmol/L Final    Comment:    A concentration of 0.5-0.8 mmol/L is advised for long-term use; concentrations of up to 1.2 mmol/L may be necessary during acute treatment.                                  Detection Limit = 0.1                           <0.1 indicates None Detected Patient drug level exceeds published reference range.  Evaluate clinically for signs of potential toxicity.      No results found for: "PHENYTOIN", "PHENOBARB", "VALPROATE", "CBMZ"   .res Assessment: Plan:     Plan:  PDMP reviewed  Add Zyprexa 2.5mg  at hs  Trazadone 50mg  at hs  Depakote 1000 BID per PCP.   RTC 3 months  Patient totally disabled an unable to work.  Time spent with patient was 25 minutes. Greater than 50% of face to face time with patient was spent on counseling and coordination of care. Wife wanting to discuss concerns.  Discussed  potential metabolic side effects associated with atypical antipsychotics, as well as potential risk for movement side effects. Advised pt to contact office if movement side effects occur.    Patient advised to contact office with any questions, adverse effects, or acute worsening in signs and symptoms.  Diagnoses and all orders for this visit:  Bipolar I disorder (HCC) -     OLANZapine (ZYPREXA) 2.5 MG tablet; Take 1 tablet (2.5 mg total) by mouth at bedtime.  Insomnia, unspecified type     Please see After Visit Summary for patient specific instructions.  Future Appointments  Date Time Provider Department Center  02/07/2023  3:25 PM Mechele Claude, MD [facility] None  03/22/2023  4:20 PM Hridaan Bouse, Thereasa Solo, NP [facility] None  05/04/2023  3:25 PM Mechele Claude, MD [facility] None    No orders of the defined types were placed in this encounter.   -------------------------------

## 2023-01-24 DIAGNOSIS — M1711 Unilateral primary osteoarthritis, right knee: Secondary | ICD-10-CM | POA: Diagnosis not present

## 2023-01-26 DIAGNOSIS — M1711 Unilateral primary osteoarthritis, right knee: Secondary | ICD-10-CM | POA: Diagnosis not present

## 2023-01-30 DIAGNOSIS — M25571 Pain in right ankle and joints of right foot: Secondary | ICD-10-CM | POA: Diagnosis not present

## 2023-02-02 DIAGNOSIS — M25571 Pain in right ankle and joints of right foot: Secondary | ICD-10-CM | POA: Diagnosis not present

## 2023-02-05 ENCOUNTER — Other Ambulatory Visit: Payer: Self-pay | Admitting: Family Medicine

## 2023-02-05 DIAGNOSIS — J0110 Acute frontal sinusitis, unspecified: Secondary | ICD-10-CM

## 2023-02-07 ENCOUNTER — Ambulatory Visit: Payer: BC Managed Care – PPO | Admitting: Family Medicine

## 2023-02-09 ENCOUNTER — Ambulatory Visit: Payer: BC Managed Care – PPO

## 2023-02-11 ENCOUNTER — Other Ambulatory Visit: Payer: Self-pay | Admitting: Adult Health

## 2023-02-11 DIAGNOSIS — F319 Bipolar disorder, unspecified: Secondary | ICD-10-CM

## 2023-02-14 ENCOUNTER — Other Ambulatory Visit: Payer: Self-pay | Admitting: Adult Health

## 2023-02-14 DIAGNOSIS — G4700 Insomnia, unspecified: Secondary | ICD-10-CM

## 2023-02-17 ENCOUNTER — Telehealth (INDEPENDENT_AMBULATORY_CARE_PROVIDER_SITE_OTHER): Payer: BC Managed Care – PPO | Admitting: Family Medicine

## 2023-02-17 DIAGNOSIS — J320 Chronic maxillary sinusitis: Secondary | ICD-10-CM

## 2023-02-17 DIAGNOSIS — M25571 Pain in right ankle and joints of right foot: Secondary | ICD-10-CM | POA: Diagnosis not present

## 2023-02-17 MED ORDER — AZITHROMYCIN 250 MG PO TABS
ORAL_TABLET | ORAL | 0 refills | Status: AC
Start: 2023-02-17 — End: 2023-02-22

## 2023-02-17 NOTE — Progress Notes (Signed)
Virtual Visit via Video Note  I connected with Melvin Roberts on 02/17/23 at  4:30 PM EDT by a video enabled telemedicine application and verified that I am speaking with the correct person using two identifiers.  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 I also discussed with the patient that there may be a patient responsible charge related to this service. The patient expressed understanding and agreed to proceed.  Subjective: PCP: Mechele Claude, MD  Chief Complaint  Patient presents with   URI   URI    States that he has "wicked sinus infection". Reports that it started 4 days ago, but he does not want it to get worse through the weekend. Reports green rhinorrhea, congestion, coughing, sneezing. Denies fever. Has not yet taken a covid test. States that this happens twice a year. Believes that it was brought on by raking leaves last week States that he normally takes antibiotics. States that zpack works better than other antibiotics. States that he is taking tylenol severe cold and sinus. States that it usually works but is not this time. Has tried a full pack of it and not improved.   ROS: Per HPI  Current Outpatient Medications:    cholecalciferol (VITAMIN D) 1000 UNITS tablet, Take 1,000 Units by mouth daily., Disp: , Rfl:    divalproex (DEPAKOTE) 500 MG DR tablet, Take 2 tablets (1,000 mg total) by mouth 2 (two) times daily., Disp: 360 tablet, Rfl: 3   fluticasone (FLONASE) 50 MCG/ACT nasal spray, SPRAY 2 SPRAYS INTO EACH NOSTRIL EVERY DAY, Disp: 48 mL, Rfl: 1   methocarbamol (ROBAXIN) 500 MG tablet, Take 1 tablet (500 mg total) by mouth 4 (four) times daily., Disp: 120 tablet, Rfl: 1   OLANZapine (ZYPREXA) 2.5 MG tablet, Take 1 tablet (2.5 mg total) by mouth at  bedtime., Disp: 30 tablet, Rfl: 2   pantoprazole (PROTONIX) 40 MG tablet, Take 1 tablet (40 mg total) by mouth daily., Disp: 90 tablet, Rfl: 3   predniSONE (STERAPRED UNI-PAK 21 TAB) 10 MG (21) TBPK tablet, Use as directed on back of pill pack, Disp: 21 tablet, Rfl: 0   traZODone (DESYREL) 50 MG tablet, TAKE 1 TABLET BY MOUTH EVERYDAY AT BEDTIME, Disp: 30 tablet, Rfl: 0  Observations/Objective: There were no vitals filed for this visit. Physical Exam Constitutional:      General: He is awake. He is not in acute distress.    Appearance: Normal appearance. He is well-developed and well-groomed. He is not ill-appearing, toxic-appearing or diaphoretic.  HENT:     Nose: Congestion present.  Pulmonary:     Effort: Pulmonary effort is normal.  Neurological:     General: No focal deficit present.     Mental Status: He is alert and oriented to person, place, and time.  Psychiatric:        Attention and Perception: Attention and perception normal.        Mood and Affect: Mood and affect normal.        Speech: Speech normal.        Behavior: Behavior normal. Behavior is cooperative.        Thought Content: Thought content normal.        Cognition and Memory: Cognition and memory normal.        Judgment: Judgment normal.     Assessment  and Plan: 1. Chronic maxillary sinusitis Discussed that likely viral in etiology at this time. Discussed with patient pocket prescription for antibiotics if he has worsening symptoms. Encouraged patient to increase fluid intake, continue mucinex and other supportive treatments at home. - azithromycin (ZITHROMAX) 250 MG tablet; Take 2 tablets on day 1, then 1 tablet daily on days 2 through 5  Dispense: 6 tablet; Refill: 0  Follow Up Instructions: Return if symptoms worsen or fail to improve.   I discussed the assessment and treatment plan with the patient. The patient was provided an opportunity to ask questions, and all were answered. The patient agreed with  the plan and demonstrated an understanding of the instructions.   The patient was advised to call back or seek an in-person evaluation if the symptoms worsen or if the condition fails to improve as anticipated.  The above assessment and management plan was discussed with the patient. The patient verbalized understanding of and has agreed to the management plan.   Neale Burly, DNP-FNP [facility] [address] [place], [place] [address] [phone]

## 2023-02-20 ENCOUNTER — Telehealth: Payer: Self-pay | Admitting: Adult Health

## 2023-02-20 ENCOUNTER — Encounter: Payer: Self-pay | Admitting: Adult Health

## 2023-02-20 ENCOUNTER — Ambulatory Visit (INDEPENDENT_AMBULATORY_CARE_PROVIDER_SITE_OTHER): Payer: BC Managed Care – PPO | Admitting: Adult Health

## 2023-02-20 DIAGNOSIS — G4700 Insomnia, unspecified: Secondary | ICD-10-CM

## 2023-02-20 DIAGNOSIS — F319 Bipolar disorder, unspecified: Secondary | ICD-10-CM | POA: Diagnosis not present

## 2023-02-20 DIAGNOSIS — Z79899 Other long term (current) drug therapy: Secondary | ICD-10-CM | POA: Diagnosis not present

## 2023-02-20 MED ORDER — OLANZAPINE 5 MG PO TABS: 5.0000 mg | ORAL_TABLET | Freq: Every day | ORAL | 2 refills | Status: DC

## 2023-02-20 MED ORDER — TRAZODONE HCL 100 MG PO TABS
100.0000 mg | ORAL_TABLET | Freq: Every day | ORAL | 2 refills | Status: DC
Start: 2023-02-20 — End: 2023-03-14

## 2023-02-20 NOTE — Telephone Encounter (Signed)
[name] called at 4:40 to request a letter for work excusing him from work 02/20/23 - 12/1/624. HR is requesting this from his provider.  I have type a letter.  It is in Gina's box to review, edit and or sign.

## 2023-02-20 NOTE — Telephone Encounter (Signed)
Pt lvm that he needs a cover letter to go with the letter

## 2023-02-20 NOTE — Progress Notes (Signed)
Melvin Roberts [id number] 05-Oct-1960 62 y.o.  Subjective:   Patient ID:  Melvin Roberts is a 62 y.o. (DOB 1960-09-19) male.  Chief Complaint: No chief complaint on file.   HPI Melvin Roberts presents to the office today for follow-up of Bipolar Disorder, high risk medication use and insomnia.  Referred by PCP - Bipolar disorder.  Describes mood today as "about the same". Irritated. Communicative. Denies tearfulness. Mood symptoms - denies depression. Reports increased irritability and anxiety. Reports increased aggravation and agitation. Denies panic attacks. Reports worry, rumination, and over thinking - mostly surrounding son. Mood is elevated. Stating "I don't feel like I'm doing too good". Feels like the Depakote and Trazadone are helpful, but may need to be adjusted. He and wife doing well. Reports ongoing concerns about son's mental health. Decreased interest and motivation. Taking medications as prescribed. Energy levels stable. Active, does not have a regular exercise routine - knee replacement.  Enjoys some usual interests and activities. Married. Lives with wife. Has 5 children and 11 grandchildren. Spending time with family. Appetite adequate. Weight loss - 208 from 220 pounds. Reports sleep has decreased. Averages 3 to 4 hours. Focus and concentration difficultness. Completing tasks. Managing aspects of household. 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 Denies SI or HI.  Denies AH or VH. Denies self harm. Denies substance use. Reports hearing loss - awaiting hearing aids.  Previous medication trials: Lithium, Seroquel  GAD-7    Flowsheet Row Office Visit from 12/13/2022 in [facility] Office Visit from 12/08/2022 in [facility] Office Visit from 10/31/2022 in [facility] Office Visit from 09/26/2022 in [facility] Office Visit from 08/09/2022 in [facility]  Total GAD-7 Score 5 5 4 3 4       PHQ2-9    Flowsheet Row Office Visit from 12/13/2022 in [facility] Office Visit from 12/08/2022 in [facility] Office Visit from 10/31/2022 in [facility] Office Visit from 09/26/2022 in [facility] Office Visit from 08/09/2022 in [facility]  PHQ-2 Total Score 2 2 1 1 2   PHQ-9 Total Score 5 6 3 4 5       Flowsheet Row ED from 02/26/2022 in [facility] Emergency Department at [facility] ED from 09/20/2020 in [facility] Emergency Department at [facility]  C-SSRS RISK CATEGORY No Risk No Risk        Review of Systems:  Review of Systems  Musculoskeletal:  Negative for gait problem.  Neurological:  Negative for tremors.  Psychiatric/Behavioral:         Please refer to HPI    Medications: I have reviewed the patient's current medications.  Current Outpatient Medications  Medication Sig Dispense Refill   azithromycin (ZITHROMAX) 250 MG tablet Take 2 tablets on day 1, then 1 tablet daily on days 2 through 5 6 tablet 0   cholecalciferol (VITAMIN D) 1000 UNITS tablet Take 1,000 Units by mouth daily.     divalproex (DEPAKOTE) 500 MG DR tablet Take 2 tablets (1,000 mg total) by mouth 2 (two) times daily. 360 tablet 3   fluticasone (FLONASE) 50 MCG/ACT nasal spray SPRAY 2 SPRAYS INTO EACH NOSTRIL EVERY DAY 48 mL 1   methocarbamol (ROBAXIN) 500 MG tablet Take 1 tablet (  500 mg total) by mouth 4 (four) times daily. 120 tablet 1   OLANZapine (ZYPREXA) 2.5 MG tablet Take 1 tablet (2.5 mg total) by mouth at bedtime. 30 tablet 2   pantoprazole (PROTONIX) 40 MG tablet Take 1 tablet (40 mg total) by mouth daily. 90 tablet 3   predniSONE (STERAPRED UNI-PAK 21 TAB) 10 MG (21)  TBPK tablet Use as directed on back of pill pack 21 tablet 0   traZODone (DESYREL) 50 MG tablet TAKE 1 TABLET BY MOUTH EVERYDAY AT BEDTIME 30 tablet 0   No current facility-administered medications for this visit.    Medication Side Effects: None  Allergies:  Allergies  Allergen Reactions   Lipitor [Atorvastatin]    Niacin    Niaspan [Niacin Er (Antihyperlipidemic)]     Past Medical History:  Diagnosis Date   Bipolar 1 disorder (HCC)    Hyperlipidemia    Hypertension     Past Medical History, Surgical history, Social history, and Family history were reviewed and updated as appropriate.   Please see review of systems for further details on the patient's review from today.   Objective:   Physical Exam:  There were no vitals taken for this visit.  Physical Exam Constitutional:      General: He is not in acute distress. Musculoskeletal:        General: No deformity.  Neurological:     Mental Status: He is alert and oriented to person, place, and time.     Coordination: Coordination normal.  Psychiatric:        Attention and Perception: Attention and perception normal. He does not perceive auditory or visual hallucinations.        Mood and Affect: Affect is not labile, blunt, angry or inappropriate.        Speech: Speech normal.        Behavior: Behavior normal.        Thought Content: Thought content normal. Thought content is not paranoid or delusional. Thought content does not include homicidal or suicidal ideation. Thought content does not include homicidal or suicidal plan.        Cognition and Memory: Cognition and memory normal.        Judgment: Judgment normal.     Comments: Insight intact     Lab Review:     Component Value Date/Time   NA 144 09/26/2022 1447   K 4.7 09/26/2022 1447   CL 108 (H) 09/26/2022 1447   CO2 22 09/26/2022 1447   GLUCOSE 93 09/26/2022 1447   GLUCOSE 117 (H) 02/26/2022 2205   BUN 22 09/26/2022 1447   CREATININE 1.16 09/26/2022  1447   CALCIUM 10.5 (H) 09/26/2022 1447   PROT 6.6 09/22/2022 0935   ALBUMIN 4.2 09/22/2022 0935   AST 21 09/22/2022 0935   ALT 15 09/22/2022 0935   ALKPHOS 57 09/22/2022 0935   BILITOT <0.2 09/22/2022 0935   GFRNONAA >60 02/26/2022 2205   GFRAA 61 05/29/2020 0854       Component Value Date/Time   WBC 9.1 02/26/2022 2205   RBC 4.00 (L) 02/26/2022 2205   HGB 12.4 (L) 02/26/2022 2205   HGB 12.8 (L) 01/27/2022 1529   HCT 37.3 (L) 02/26/2022 2205   HCT 37.3 (L) 01/27/2022 1529   PLT 241 02/26/2022 2205   PLT 303 01/27/2022 1529   MCV 93.3 02/26/2022 2205   MCV 91 01/27/2022 1529   MCH 31.0 02/26/2022 2205   MCHC 33.2 02/26/2022 2205   RDW 15.7 (H) 02/26/2022  2205   RDW 14.3 01/27/2022 1529   LYMPHSABS 2.0 02/26/2022 2205   LYMPHSABS 2.2 01/27/2022 1529   MONOABS 0.6 02/26/2022 2205   EOSABS 0.1 02/26/2022 2205   EOSABS 0.1 01/27/2022 1529   BASOSABS 0.0 02/26/2022 2205   BASOSABS 0.0 01/27/2022 1529    Lithium Lvl  Date Value Ref Range Status  06/09/2022 1.3 (HH) 0.5 - 1.2 mmol/L Final    Comment:    A concentration of 0.5-0.8 mmol/L is advised for long-term use; concentrations of up to 1.2 mmol/L may be necessary during acute treatment.                                  Detection Limit = 0.1                           <0.1 indicates None Detected Patient drug level exceeds published reference range.  Evaluate clinically for signs of potential toxicity.      No results found for: "PHENYTOIN", "PHENOBARB", "VALPROATE", "CBMZ"   .res Assessment: Plan:    Plan:  PDMP reviewed  Increase Zyprexa 2.5mg  to 5mg  at hs  Increase Trazadone 50mg  to 100mg  at hs  Depakote 1000 BID per PCP - follow up with PCP 02/21/2023 for labs. Patient will call after appt. to discuss changes.  RTC 2 weeks   Patient totally disabled an unable to work 02/17/2023 through 04/03/2023.  Time spent with patient was 25 minutes. Greater than 50% of face to face time with patient was spent  on counseling and coordination of care. Discussed disability with current mental health issues. Patient unable to complete job functions with current mental health instability.  Discussed potential metabolic side effects associated with atypical antipsychotics, as well as potential risk for movement side effects. Advised pt to contact office if movement side effects occur.    Patient advised to contact office with any questions, adverse effects, or acute worsening in signs and symptoms.  There are no diagnoses linked to this encounter.   Please see After Visit Summary for patient specific instructions.  Future Appointments  Date Time Provider Department Center  02/20/2023 11:20 AM Eilish Mcdaniel, Thereasa Solo, NP [facility] None  02/21/2023  2:30 PM [facility] NURSE [facility] None  03/22/2023  4:20 PM Ersel Wadleigh, Thereasa Solo, NP CP[facility]CP None  05/04/2023  3:25 PM Mechele Claude, MD [facility] None    No orders of the defined types were placed in this encounter.   -------------------------------

## 2023-02-21 ENCOUNTER — Other Ambulatory Visit: Payer: BC Managed Care – PPO

## 2023-02-21 ENCOUNTER — Other Ambulatory Visit: Payer: Self-pay

## 2023-02-21 ENCOUNTER — Other Ambulatory Visit: Payer: Self-pay | Admitting: Adult Health

## 2023-02-21 ENCOUNTER — Ambulatory Visit (INDEPENDENT_AMBULATORY_CARE_PROVIDER_SITE_OTHER): Payer: BC Managed Care – PPO

## 2023-02-21 DIAGNOSIS — Z79899 Other long term (current) drug therapy: Secondary | ICD-10-CM | POA: Diagnosis not present

## 2023-02-21 DIAGNOSIS — Z23 Encounter for immunization: Secondary | ICD-10-CM

## 2023-02-21 NOTE — Addendum Note (Signed)
Addended by: Dorothyann Gibbs on: 02/21/2023 11:42 AM   Modules accepted: Orders

## 2023-02-21 NOTE — Telephone Encounter (Signed)
Entered VPA for [facility].

## 2023-02-23 LAB — VALPROIC ACID LEVEL: Valproic Acid Lvl: 64 ug/mL (ref 50–100)

## 2023-02-23 NOTE — Progress Notes (Signed)
LVM to RC 

## 2023-02-24 ENCOUNTER — Telehealth: Payer: Self-pay

## 2023-02-24 DIAGNOSIS — F439 Reaction to severe stress, unspecified: Secondary | ICD-10-CM | POA: Diagnosis not present

## 2023-02-24 NOTE — Telephone Encounter (Signed)
Per discussion with Almira Coaster, patient advised to increase Depakote to 1500 mg BID.

## 2023-03-01 ENCOUNTER — Other Ambulatory Visit: Payer: Self-pay

## 2023-03-01 ENCOUNTER — Telehealth: Payer: Self-pay | Admitting: Adult Health

## 2023-03-01 DIAGNOSIS — F319 Bipolar disorder, unspecified: Secondary | ICD-10-CM

## 2023-03-01 MED ORDER — DIVALPROEX SODIUM 500 MG PO DR TAB: 1500.0000 mg | DELAYED_RELEASE_TABLET | Freq: Two times a day (BID) | ORAL | 1 refills | Status: DC

## 2023-03-01 NOTE — Telephone Encounter (Signed)
Sent!

## 2023-03-01 NOTE — Telephone Encounter (Signed)
Please call to schedule earlier appt. Last seen 11/4 with FU in 2 weeks. He is currently scheduled for 12/4.

## 2023-03-01 NOTE — Telephone Encounter (Signed)
Melvin Roberts called yesterday at 7:19pm and LM that he needs a refill of his Depakote since his dose has increased.  It is an early refill so the pharmacy won't fill it.  Please send in new prescription with new dose.

## 2023-03-01 NOTE — Telephone Encounter (Signed)
Melvin Roberts said he is sleeping better and said things are slowing down. Instead of his mind going at 100 MPH he is about 77 MPH. Going in the right direction. Didn't know how long it would take to get back to baseline.

## 2023-03-02 ENCOUNTER — Telehealth: Payer: Self-pay | Admitting: Adult Health

## 2023-03-02 DIAGNOSIS — Z79899 Other long term (current) drug therapy: Secondary | ICD-10-CM

## 2023-03-02 NOTE — Telephone Encounter (Signed)
Patient lvm at 11:18 stating that he needs a letter to go with his disability paperwork. He doesn't know what is needed but pretty sure he does need letter done. Ph: [phone]

## 2023-03-02 NOTE — Telephone Encounter (Signed)
I have not received any disability paperwork on him if I should have?

## 2023-03-02 NOTE — Telephone Encounter (Signed)
[name] returned called at 2:00 stating that he applying for SS disability and is filling out the packet of information they requested.  There really isn't anything in this packet that the dr. Grayridge Sink out.  They will request records which we will send once requested.  We did the letter and he will supply that with the packet he has.  Right now there is no paperwork for [place] to do.

## 2023-03-02 NOTE — Telephone Encounter (Signed)
LVM TO RC

## 2023-03-02 NOTE — Telephone Encounter (Signed)
See message.

## 2023-03-08 DIAGNOSIS — F439 Reaction to severe stress, unspecified: Secondary | ICD-10-CM | POA: Diagnosis not present

## 2023-03-10 ENCOUNTER — Ambulatory Visit (INDEPENDENT_AMBULATORY_CARE_PROVIDER_SITE_OTHER): Payer: BC Managed Care – PPO | Admitting: Adult Health

## 2023-03-10 ENCOUNTER — Encounter: Payer: Self-pay | Admitting: Adult Health

## 2023-03-10 DIAGNOSIS — F319 Bipolar disorder, unspecified: Secondary | ICD-10-CM

## 2023-03-10 DIAGNOSIS — G4700 Insomnia, unspecified: Secondary | ICD-10-CM | POA: Diagnosis not present

## 2023-03-10 DIAGNOSIS — Z79899 Other long term (current) drug therapy: Secondary | ICD-10-CM | POA: Diagnosis not present

## 2023-03-10 DIAGNOSIS — F411 Generalized anxiety disorder: Secondary | ICD-10-CM

## 2023-03-10 NOTE — Progress Notes (Signed)
[name] [id number] 1960/09/08 62 y.o.  Subjective:   Patient ID:  Melvin Roberts is a 62 y.o. (DOB Jul 20, 1960) male.  Chief Complaint: No chief complaint on file.   HPI Melvin Roberts presents to the office today for follow-up of Bipolar Disorder, high risk medication use and insomnia.  Accompanied by wife.  Describes mood today as "a little better". Mood symptoms - decreased depression and irritability. Reports anxiety - work related. Reports decreased irritation and aggravation. Reports less talkative and  communicative. Denies tearfulness. Denies panic attacks. Reports decreased worry, rumination, and over thinking. Mood has improved. Stating "I feel like I have slowed down - someone hit the brakes". Wife also comments on the improvement, but feels he is sluggish mentally and physically. Currently taking Olanzapine, Depakote and Trazadone. Feels like current medication is helpful. He and wife doing well. Reports ongoing concerns about son's mental health. Decreased interest and motivation. Taking medications as prescribed. Energy levels stable. Active, does not have a regular exercise routine - knee replacement.  Enjoys some usual interests and activities. Married. Lives with wife. Has 5 children and 11 grandchildren. Spending time with family. Appetite adequate. Weight 210 pounds. Reports sleep has improved. Averages 5 to 6 hours. Focus and concentration difficulties. Completing tasks. Managing aspects of household. 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 Denies SI or HI.  Denies AH or VH. Denies self harm. Denies substance use. Reports hearing loss - awaiting hearing aids.  Previous medication trials: Lithium, Seroquel   GAD-7    Flowsheet Row Office Visit from 12/13/2022 in [facility] Office Visit from 12/08/2022 in [facility] Office Visit from 10/31/2022 in  [facility] Office Visit from 09/26/2022 in [facility] Office Visit from 08/09/2022 in [facility]  Total GAD-7 Score 5 5 4 3 4       PHQ2-9    Flowsheet Row Office Visit from 12/13/2022 in [facility] Office Visit from 12/08/2022 in [facility] Office Visit from 10/31/2022 in [facility] Office Visit from 09/26/2022 in [facility] Office Visit from 08/09/2022 in [facility]  PHQ-2 Total Score 2 2 1 1 2   PHQ-9 Total Score 5 6 3 4 5       Flowsheet Row ED from 02/26/2022 in [facility] Emergency Department at [facility] ED from 09/20/2020 in [facility] Department at [facility]  C-SSRS RISK CATEGORY No Risk No Risk        Review of Systems:  Review of Systems  Musculoskeletal:  Negative for gait problem.  Neurological:  Negative for tremors.  Psychiatric/Behavioral:         Please refer to HPI    Medications: I have reviewed the patient's current medications.  Current Outpatient Medications  Medication Sig Dispense Refill   cholecalciferol (VITAMIN D) 1000 UNITS tablet Take 1,000 Units by mouth daily.     divalproex (DEPAKOTE) 500 MG DR tablet Take 3 tablets (1,500 mg total) by mouth 2 (two) times daily. 180 tablet 1   fluticasone (FLONASE) 50 MCG/ACT nasal spray SPRAY 2 SPRAYS INTO EACH NOSTRIL EVERY DAY 48 mL 1   methocarbamol (ROBAXIN) 500 MG tablet Take 1 tablet (500 mg total) by mouth 4 (four) times daily. 120 tablet 1  OLANZapine (ZYPREXA) 5 MG tablet Take 1 tablet (5 mg total) by mouth at bedtime. 30 tablet 2   pantoprazole (PROTONIX) 40 MG tablet Take 1 tablet (40 mg total) by mouth daily. 90 tablet 3   predniSONE (STERAPRED UNI-PAK 21 TAB) 10 MG (21) TBPK tablet Use as  directed on back of pill pack 21 tablet 0   traZODone (DESYREL) 100 MG tablet Take 1 tablet (100 mg total) by mouth at bedtime. 30 tablet 2   No current facility-administered medications for this visit.    Medication Side Effects: None  Allergies:  Allergies  Allergen Reactions   Lipitor [Atorvastatin]    Niacin    Niaspan [Niacin Er (Antihyperlipidemic)]     Past Medical History:  Diagnosis Date   Bipolar 1 disorder (HCC)    Hyperlipidemia    Hypertension     Past Medical History, Surgical history, Social history, and Family history were reviewed and updated as appropriate.   Please see review of systems for further details on the patient's review from today.   Objective:   Physical Exam:  There were no vitals taken for this visit.  Physical Exam Constitutional:      General: He is not in acute distress. Musculoskeletal:        General: No deformity.  Neurological:     Mental Status: He is alert and oriented to person, place, and time.     Coordination: Coordination normal.  Psychiatric:        Attention and Perception: Attention and perception normal. He does not perceive auditory or visual hallucinations.        Mood and Affect: Affect is not labile, blunt, angry or inappropriate.        Speech: Speech normal.        Behavior: Behavior normal.        Thought Content: Thought content normal. Thought content is not paranoid or delusional. Thought content does not include homicidal or suicidal ideation. Thought content does not include homicidal or suicidal plan.        Cognition and Memory: Cognition and memory normal.        Judgment: Judgment normal.     Comments: Insight intact     Lab Review:     Component Value Date/Time   NA 144 09/26/2022 1447   K 4.7 09/26/2022 1447   CL 108 (H) 09/26/2022 1447   CO2 22 09/26/2022 1447   GLUCOSE 93 09/26/2022 1447   GLUCOSE 117 (H) 02/26/2022 2205   BUN 22 09/26/2022 1447   CREATININE 1.16 09/26/2022 1447    CALCIUM 10.5 (H) 09/26/2022 1447   PROT 6.6 09/22/2022 0935   ALBUMIN 4.2 09/22/2022 0935   AST 21 09/22/2022 0935   ALT 15 09/22/2022 0935   ALKPHOS 57 09/22/2022 0935   BILITOT <0.2 09/22/2022 0935   GFRNONAA >60 02/26/2022 2205   GFRAA 61 05/29/2020 0854       Component Value Date/Time   WBC 9.1 02/26/2022 2205   RBC 4.00 (L) 02/26/2022 2205   HGB 12.4 (L) 02/26/2022 2205   HGB 12.8 (L) 01/27/2022 1529   HCT 37.3 (L) 02/26/2022 2205   HCT 37.3 (L) 01/27/2022 1529   PLT 241 02/26/2022 2205   PLT 303 01/27/2022 1529   MCV 93.3 02/26/2022 2205   MCV 91 01/27/2022 1529   MCH 31.0 02/26/2022 2205   MCHC 33.2 02/26/2022 2205   RDW 15.7 (H) 02/26/2022 2205   RDW 14.3 01/27/2022 1529   LYMPHSABS 2.0 02/26/2022  2205   LYMPHSABS 2.2 01/27/2022 1529   MONOABS 0.6 02/26/2022 2205   EOSABS 0.1 02/26/2022 2205   EOSABS 0.1 01/27/2022 1529   BASOSABS 0.0 02/26/2022 2205   BASOSABS 0.0 01/27/2022 1529    Lithium Lvl  Date Value Ref Range Status  06/09/2022 1.3 (HH) 0.5 - 1.2 mmol/L Final    Comment:    A concentration of 0.5-0.8 mmol/L is advised for long-term use; concentrations of up to 1.2 mmol/L may be necessary during acute treatment.                                  Detection Limit = 0.1                           <0.1 indicates None Detected Patient drug level exceeds published reference range.  Evaluate clinically for signs of potential toxicity.      Lab Results  Component Value Date   VALPROATE 64 02/21/2023     .res Assessment: Plan:    Plan:  PDMP reviewed  Zyprexa 5mg  at hs Trazadone 100mg  at hs Depakote 1500 BID - change to 1000mg  in the morning and 2000 at bedtime.  Will order BMP and VPA level   RTC 2 weeks   Patient totally disabled an unable to work 03/10/2023 through 05/09/2022.  Time spent with patient was 25 minutes. Greater than 50% of face to face time with patient was spent on counseling and coordination of care. Discussed disability  with current mental health issues. Patient unable to complete job functions with current mental health instability.  Discussed potential metabolic side effects associated with atypical antipsychotics, as well as potential risk for movement side effects. Advised pt to contact office if movement side effects occur.    Patient advised to contact office with any questions, adverse effects, or acute worsening in signs and symptoms. There are no diagnoses linked to this encounter.   Please see After Visit Summary for patient specific instructions.  Future Appointments  Date Time Provider Department Center  03/10/2023  4:00 PM Melaine Mcphee, Thereasa Solo, NP CP-CP None  03/22/2023  4:20 PM Derricka Mertz, Thereasa Solo, NP CP-CP None  05/04/2023  3:25 PM Mechele Claude, MD [facility] None    No orders of the defined types were placed in this encounter.   -------------------------------

## 2023-03-13 ENCOUNTER — Telehealth: Payer: Self-pay | Admitting: Adult Health

## 2023-03-13 DIAGNOSIS — Z79899 Other long term (current) drug therapy: Secondary | ICD-10-CM | POA: Diagnosis not present

## 2023-03-13 NOTE — Telephone Encounter (Signed)
FYI: He got bloodwork done today.

## 2023-03-13 NOTE — Telephone Encounter (Signed)
Pt called at 9:02a to let Almira Coaster know he went and had bloodwork done this morning at [facility].  He said Almira Coaster told him to go get it done asap.  Next appt 12/4

## 2023-03-14 ENCOUNTER — Other Ambulatory Visit: Payer: Self-pay | Admitting: Adult Health

## 2023-03-14 DIAGNOSIS — F319 Bipolar disorder, unspecified: Secondary | ICD-10-CM

## 2023-03-14 DIAGNOSIS — G4700 Insomnia, unspecified: Secondary | ICD-10-CM

## 2023-03-14 LAB — BASIC METABOLIC PANEL
BUN/Creatinine Ratio: 12 (ref 10–24)
BUN: 15 mg/dL (ref 8–27)
CO2: 23 mmol/L (ref 20–29)
Calcium: 10.4 mg/dL — ABNORMAL HIGH (ref 8.6–10.2)
Chloride: 105 mmol/L (ref 96–106)
Creatinine, Ser: 1.24 mg/dL (ref 0.76–1.27)
Glucose: 90 mg/dL (ref 70–99)
Potassium: 4.8 mmol/L (ref 3.5–5.2)
Sodium: 142 mmol/L (ref 134–144)
eGFR: 66 mL/min/{1.73_m2} (ref >59)

## 2023-03-14 LAB — VALPROIC ACID LEVEL: Valproic Acid Lvl: 102 ug/mL — ABNORMAL HIGH (ref 50–100)

## 2023-03-14 NOTE — Telephone Encounter (Signed)
Wife call and lvm stating Melvin Roberts is not ok. She stated nothing has really improved since the medication adjustment. She stated he just isn't himself. His balance and his memory both are not good.  Contact [name] at # [phone]

## 2023-03-14 NOTE — Telephone Encounter (Signed)
Pt's wife called and says he is not ok. He still has a shuffling gait, wants to spend money but she denied mania. She said due to memory issues he has note cards all over the house to remind him of things. He is aware that he does have memory issues. She said she tries to pay all the bills but lets him know about them so that he doesn't feel like he doesn't have any control. He has anxiety. She said all the kids and grandkids will be together for TG and she questions if that can be contributing to anxiety to some degree. He isn't sleeping well, about 4 hours a night. She asks if you could reduce the PM Depakote to 3. Told wife I had just talked to him today and let her know what he said. He has FU 12/4.

## 2023-03-14 NOTE — Telephone Encounter (Signed)
He reports he is better since spreading out the dose, doesn't feel like a zombie. Not as irritable. I asked how his wife thought he was doing and he said she thought he was doing ok.

## 2023-03-14 NOTE — Telephone Encounter (Signed)
From 11/25  VPA 102 Calcium 10.4 - all other values normal

## 2023-03-20 ENCOUNTER — Telehealth: Payer: Self-pay | Admitting: Adult Health

## 2023-03-20 NOTE — Telephone Encounter (Signed)
Wife, called at 3:20 to give an update. [name] did okay over the long weekend, but he is extremely tired. His gait is off and he tends to lean forward and his memory is the same.

## 2023-03-21 NOTE — Telephone Encounter (Signed)
Wife notified of referral and that Neuro would contact them directly to schedule appt.

## 2023-03-22 ENCOUNTER — Ambulatory Visit: Payer: BC Managed Care – PPO | Admitting: Adult Health

## 2023-03-22 ENCOUNTER — Encounter: Payer: Self-pay | Admitting: Adult Health

## 2023-03-22 DIAGNOSIS — F411 Generalized anxiety disorder: Secondary | ICD-10-CM

## 2023-03-22 DIAGNOSIS — G4700 Insomnia, unspecified: Secondary | ICD-10-CM | POA: Diagnosis not present

## 2023-03-22 DIAGNOSIS — F319 Bipolar disorder, unspecified: Secondary | ICD-10-CM

## 2023-03-22 NOTE — Progress Notes (Signed)
[name] [id number] 1960/11/25 62 y.o.  Subjective:   Patient ID:  Melvin Roberts is a 62 y.o. (DOB Mar 02, 1961) male.  Chief Complaint: No chief complaint on file.   HPI Melvin Roberts presents to the office today for follow-up of Bipolar Disorder, GAD and insomnia.  Accompanied by wife.  Describes mood today as "about the same". Mood symptoms - reports decreased depression and anxiety. Reports irritability. Reports irritation and aggravation. Reports less talkative and communicative - elevated over past 2 days. Denies panic attacks. Reports some worry, rumination, and over thinking. Mood has improved. Stating "I feel like I'm hanging in there - trying to get better". Wife feels he is still struggling with mood symptoms. Currently taking Olanzapine, Depakote and Trazadone. Feels like current medication is helpful, but is willing to consider other options. Decreased interest and motivation. Taking medications as prescribed. Energy levels lower. Active, does not have a regular exercise routine - knee replacement.  Enjoys some usual interests and activities. Married. Lives with wife. Has 5 children and 11 grandchildren. Spending time with family. Appetite adequate. Weight 210 to 212 pounds. Reports sleep has improved. Averages 7 to 8 hours. Focus and concentration difficulties - "a little off". Completing tasks. Managing aspects of household. 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 Denies SI or HI.  Denies AH or VH. Denies self harm. Denies substance use. Reports hearing loss.  Previous medication trials: Lithium, Seroquel   GAD-7    Flowsheet Row Office Visit from 12/13/2022 in [facility] Office Visit from 12/08/2022 in [facility] Office Visit from 10/31/2022 in [facility] Office Visit from 09/26/2022 in [facility] Office Visit from 08/09/2022 in [facility]  Total GAD-7 Score 5 5 4 3 4       Mini-Mental    Flowsheet Row Office Visit from 03/22/2023 in [facility]  Total Score (max 30 points ) 30      PHQ2-9    Flowsheet Row Office Visit from 12/13/2022 in [facility] Office Visit from 12/08/2022 in [facility] Office Visit from 10/31/2022 in [facility] Office Visit from 09/26/2022 in [facility] Office Visit from 08/09/2022 in [facility]  PHQ-2 Total Score 2 2 1 1 2   PHQ-9 Total Score 5 6 3 4 5       Flowsheet Row ED from 02/26/2022 in [facility] at [facility] ED from 09/20/2020 in [facility] Department at [facility]  C-SSRS RISK CATEGORY No Risk No Risk        Review of Systems:  Review of Systems  Musculoskeletal:  Negative for gait problem.  Neurological:  Negative for tremors.  Psychiatric/Behavioral:         Please refer to HPI    Medications: I have reviewed the patient's current medications.  Current Outpatient Medications  Medication Sig Dispense Refill   cholecalciferol (VITAMIN D) 1000 UNITS tablet Take 1,000 Units by mouth daily.     divalproex (DEPAKOTE) 500 MG DR tablet Take 3 tablets (1,500 mg total) by mouth 2 (two) times daily. 180 tablet 1   fluticasone (FLONASE) 50 MCG/ACT nasal spray SPRAY 2 SPRAYS INTO EACH NOSTRIL EVERY DAY 48 mL 1   methocarbamol (ROBAXIN)  500 MG tablet Take 1 tablet (500 mg total) by mouth 4 (four) times daily. 120 tablet 1   OLANZapine (ZYPREXA) 5 MG tablet TAKE 1 TABLET BY MOUTH EVERYDAY AT BEDTIME 30 tablet 0   pantoprazole (PROTONIX) 40 MG tablet Take 1 tablet (40 mg total) by mouth daily. 90 tablet 3   predniSONE (STERAPRED UNI-PAK 21  TAB) 10 MG (21) TBPK tablet Use as directed on back of pill pack 21 tablet 0   traZODone (DESYREL) 100 MG tablet TAKE 1 TABLET BY MOUTH EVERYDAY AT BEDTIME 30 tablet 0   No current facility-administered medications for this visit.    Medication Side Effects: None  Allergies:  Allergies  Allergen Reactions   Lipitor [Atorvastatin]    Niacin    Niaspan [Niacin Er (Antihyperlipidemic)]     Past Medical History:  Diagnosis Date   Bipolar 1 disorder (HCC)    Hyperlipidemia    Hypertension     Past Medical History, Surgical history, Social history, and Family history were reviewed and updated as appropriate.   Please see review of systems for further details on the patient's review from today.   Objective:   Physical Exam:  There were no vitals taken for this visit.  Physical Exam  Lab Review:     Component Value Date/Time   NA 142 03/13/2023 0717   K 4.8 03/13/2023 0717   CL 105 03/13/2023 0717   CO2 23 03/13/2023 0717   GLUCOSE 90 03/13/2023 0717   GLUCOSE 117 (H) 02/26/2022 2205   BUN 15 03/13/2023 0717   CREATININE 1.24 03/13/2023 0717   CALCIUM 10.4 (H) 03/13/2023 0717   PROT 6.6 09/22/2022 0935   ALBUMIN 4.2 09/22/2022 0935   AST 21 09/22/2022 0935   ALT 15 09/22/2022 0935   ALKPHOS 57 09/22/2022 0935   BILITOT <0.2 09/22/2022 0935   GFRNONAA >60 02/26/2022 2205   GFRAA 61 05/29/2020 0854       Component Value Date/Time   WBC 9.1 02/26/2022 2205   RBC 4.00 (L) 02/26/2022 2205   HGB 12.4 (L) 02/26/2022 2205   HGB 12.8 (L) 01/27/2022 1529   HCT 37.3 (L) 02/26/2022 2205   HCT 37.3 (L) 01/27/2022 1529   PLT 241 02/26/2022 2205   PLT 303 01/27/2022 1529   MCV 93.3 02/26/2022 2205   MCV 91 01/27/2022 1529   MCH 31.0 02/26/2022 2205   MCHC 33.2 02/26/2022 2205   RDW 15.7 (H) 02/26/2022 2205   RDW 14.3 01/27/2022 1529   LYMPHSABS 2.0 02/26/2022 2205   LYMPHSABS 2.2 01/27/2022 1529   MONOABS 0.6 02/26/2022 2205   EOSABS 0.1 02/26/2022 2205   EOSABS  0.1 01/27/2022 1529   BASOSABS 0.0 02/26/2022 2205   BASOSABS 0.0 01/27/2022 1529    Lithium Lvl  Date Value Ref Range Status  06/09/2022 1.3 (HH) 0.5 - 1.2 mmol/L Final    Comment:    A concentration of 0.5-0.8 mmol/L is advised for long-term use; concentrations of up to 1.2 mmol/L may be necessary during acute treatment.                                  Detection Limit = 0.1                           <0.1 indicates None Detected Patient drug level exceeds published reference range.  Evaluate clinically for signs of potential  toxicity.      Lab Results  Component Value Date   VALPROATE 102 (H) 03/13/2023     .res Assessment: Plan:    Plan:  PDMP reviewed  Zyprexa 5mg  at hs Trazadone 100mg  at hs Depakote 1500 BID - change to 1000mg  in the morning and 2000 at bedtime.  RTC 3 weeks   Patient totally disabled an unable to work 03/22/2023 through 06/02/2022.  Time spent with patient was 25 minutes. Greater than 50% of face to face time with patient was spent on counseling and coordination of care. Discussed disability with current mental health issues. Patient unable to complete job functions with current mental health instability.  Discussed potential metabolic side effects associated with atypical antipsychotics, as well as potential risk for movement side effects. Advised pt to contact office if movement side effects occur.    Patient advised to contact office with any questions, adverse effects, or acute worsening in signs and symptoms.  Diagnoses and all orders for this visit:  Bipolar I disorder (HCC)  Generalized anxiety disorder  Insomnia, unspecified type     Please see After Visit Summary for patient specific instructions.  Future Appointments  Date Time Provider Department Center  05/04/2023  3:25 PM Mechele Claude, MD [facility] None    No orders of the defined types were placed in this encounter.   -------------------------------

## 2023-03-25 ENCOUNTER — Other Ambulatory Visit: Payer: Self-pay | Admitting: Adult Health

## 2023-03-25 DIAGNOSIS — F319 Bipolar disorder, unspecified: Secondary | ICD-10-CM

## 2023-03-28 ENCOUNTER — Telehealth: Payer: Self-pay | Admitting: Adult Health

## 2023-03-28 NOTE — Telephone Encounter (Signed)
Wife lvm that she feels that a referral for neurologic consult should be done. She said that he is not doing well. He is losing things scattered and feels foggy.  She would  like gina to send that referrall in. Please call melanie at [phone]

## 2023-03-28 NOTE — Telephone Encounter (Signed)
Yes

## 2023-03-28 NOTE — Telephone Encounter (Signed)
See message from wife. Jola Babinski said you told her to wait on referral.

## 2023-03-29 DIAGNOSIS — F439 Reaction to severe stress, unspecified: Secondary | ICD-10-CM | POA: Diagnosis not present

## 2023-03-29 DIAGNOSIS — F3170 Bipolar disorder, currently in remission, most recent episode unspecified: Secondary | ICD-10-CM | POA: Diagnosis not present

## 2023-03-31 NOTE — Telephone Encounter (Signed)
Called and spoke with patient's wife. Wife feels like he is struggling with memory issues and agitation.  Will recheck Depakote level and BMP.

## 2023-04-03 ENCOUNTER — Telehealth: Payer: Self-pay | Admitting: Adult Health

## 2023-04-03 NOTE — Telephone Encounter (Signed)
Pt LVM  @ 1:03p.  He is asking if he needs blood work before he comes for his next visit.  Pls call him to advise.  Next appt 12/23

## 2023-04-04 ENCOUNTER — Other Ambulatory Visit: Payer: Self-pay | Admitting: Adult Health

## 2023-04-04 DIAGNOSIS — Z79899 Other long term (current) drug therapy: Secondary | ICD-10-CM

## 2023-04-05 LAB — BASIC METABOLIC PANEL
BUN/Creatinine Ratio: 14 (ref 10–24)
BUN: 21 mg/dL (ref 8–27)
CO2: 22 mmol/L (ref 20–29)
Calcium: 10.6 mg/dL — ABNORMAL HIGH (ref 8.6–10.2)
Chloride: 103 mmol/L (ref 96–106)
Creatinine, Ser: 1.46 mg/dL — ABNORMAL HIGH (ref 0.76–1.27)
Glucose: 69 mg/dL — ABNORMAL LOW (ref 70–99)
Potassium: 5.2 mmol/L (ref 3.5–5.2)
Sodium: 141 mmol/L (ref 134–144)
eGFR: 54 mL/min/{1.73_m2} — ABNORMAL LOW (ref >59)

## 2023-04-05 LAB — VALPROIC ACID LEVEL: Valproic Acid Lvl: 109 ug/mL — ABNORMAL HIGH (ref 50–100)

## 2023-04-06 ENCOUNTER — Other Ambulatory Visit: Payer: Self-pay | Admitting: Adult Health

## 2023-04-06 ENCOUNTER — Telehealth: Payer: Self-pay | Admitting: Adult Health

## 2023-04-06 DIAGNOSIS — Z79899 Other long term (current) drug therapy: Secondary | ICD-10-CM

## 2023-04-06 NOTE — Telephone Encounter (Signed)
Called and spoke with wife. Advised wife to reduce Depakote by 500mg  and reduce Trazadone to 50mg  ath bedtime.

## 2023-04-06 NOTE — Telephone Encounter (Signed)
[name] -Wife- called in this am. She is asking if Almira Coaster has reviewed Melvin Roberts's bloodwork yet? She seems some things that might be off. He is still not well, not thinking clearly, cannot do more than two things in a day, is walking with a shuffle.

## 2023-04-07 ENCOUNTER — Encounter: Payer: Self-pay | Admitting: Family Medicine

## 2023-04-07 ENCOUNTER — Telehealth: Payer: Self-pay | Admitting: Family Medicine

## 2023-04-07 ENCOUNTER — Other Ambulatory Visit: Payer: Self-pay

## 2023-04-07 DIAGNOSIS — Z79899 Other long term (current) drug therapy: Secondary | ICD-10-CM | POA: Diagnosis not present

## 2023-04-07 NOTE — Telephone Encounter (Signed)
Copied from CRM [id number]. Topic: Clinical - Medical Advice >> Apr 07, 2023  8:11 AM Herbert Seta B wrote: Reason for CRM: Patient calling because has concerns over recent labs/kidney function. Also would like to see if can make appointment for today, has red and painful swollen R toe x3 days. [phone]

## 2023-04-07 NOTE — Telephone Encounter (Signed)
Apt scheduled.  

## 2023-04-07 NOTE — Telephone Encounter (Signed)
patient has already sent a mychart message to provider about labs.  Needs appointment

## 2023-04-08 LAB — CMP AND LIVER
ALT: 32 [IU]/L (ref 0–44)
AST: 44 [IU]/L — ABNORMAL HIGH (ref 0–40)
Albumin: 4.2 g/dL (ref 3.9–4.9)
Alkaline Phosphatase: 55 [IU]/L (ref 44–121)
BUN: 18 mg/dL (ref 8–27)
Bilirubin Total: 0.3 mg/dL (ref 0.0–1.2)
Bilirubin, Direct: 0.12 mg/dL (ref 0.00–0.40)
CO2: 21 mmol/L (ref 20–29)
Calcium: 10.4 mg/dL — ABNORMAL HIGH (ref 8.6–10.2)
Chloride: 104 mmol/L (ref 96–106)
Creatinine, Ser: 1.4 mg/dL — ABNORMAL HIGH (ref 0.76–1.27)
Glucose: 80 mg/dL (ref 70–99)
Potassium: 5.1 mmol/L (ref 3.5–5.2)
Sodium: 140 mmol/L (ref 134–144)
Total Protein: 6.6 g/dL (ref 6.0–8.5)
eGFR: 57 mL/min/{1.73_m2} — ABNORMAL LOW (ref >59)

## 2023-04-08 LAB — AMMONIA: Ammonia: 90 ug/dL (ref 40–200)

## 2023-04-10 ENCOUNTER — Telehealth: Payer: BC Managed Care – PPO | Admitting: Adult Health

## 2023-04-10 ENCOUNTER — Encounter: Payer: Self-pay | Admitting: Family Medicine

## 2023-04-10 ENCOUNTER — Ambulatory Visit: Payer: BC Managed Care – PPO

## 2023-04-10 ENCOUNTER — Telehealth: Payer: Self-pay | Admitting: Adult Health

## 2023-04-10 ENCOUNTER — Encounter: Payer: Self-pay | Admitting: Adult Health

## 2023-04-10 VITALS — BP 117/78 | HR 67 | Temp 98.3°F | Ht 70.0 in | Wt 219.6 lb

## 2023-04-10 DIAGNOSIS — I10 Essential (primary) hypertension: Secondary | ICD-10-CM | POA: Diagnosis not present

## 2023-04-10 DIAGNOSIS — M79674 Pain in right toe(s): Secondary | ICD-10-CM

## 2023-04-10 DIAGNOSIS — G4700 Insomnia, unspecified: Secondary | ICD-10-CM | POA: Diagnosis not present

## 2023-04-10 DIAGNOSIS — N1831 Chronic kidney disease, stage 3a: Secondary | ICD-10-CM

## 2023-04-10 DIAGNOSIS — F319 Bipolar disorder, unspecified: Secondary | ICD-10-CM

## 2023-04-10 DIAGNOSIS — F411 Generalized anxiety disorder: Secondary | ICD-10-CM

## 2023-04-10 MED ORDER — OLANZAPINE 5 MG PO TABS: 5.0000 mg | ORAL_TABLET | Freq: Every day | ORAL | 2 refills | Status: DC

## 2023-04-10 MED ORDER — TRAZODONE HCL 100 MG PO TABS: 100.0000 mg | ORAL_TABLET | Freq: Every day | ORAL | 2 refills | Status: DC

## 2023-04-10 MED ORDER — NAPROXEN SODIUM 550 MG PO TABS: 550.0000 mg | ORAL_TABLET | Freq: Two times a day (BID) | ORAL | 0 refills | Status: DC

## 2023-04-10 NOTE — Patient Instructions (Signed)
Gout  Gout is a condition that causes painful swelling of the joints. Gout is a type of inflammation of the joints (arthritis). This condition is caused by having too much uric acid in the body. Uric acid is a chemical that forms when the body breaks down substances called purines. Purines are important for building body proteins. When the body has too much uric acid, sharp crystals can form and build up inside the joints. This causes pain and swelling. Gout attacks can happen quickly and may be very painful (acute gout). Over time, the attacks can affect more joints and become more frequent (chronic gout). Gout can also cause uric acid to build up under the skin and inside the kidneys. What are the causes? This condition is caused by too much uric acid in your blood. This can happen because: Your kidneys do not remove enough uric acid from your blood. This is the most common cause. Your body makes too much uric acid. This can happen with some cancers and cancer treatments. It can also occur if your body is breaking down too many red blood cells (hemolytic anemia). You eat too many foods that are high in purines. These foods include organ meats and some seafood. Alcohol, especially beer, is also high in purines. A gout attack may be triggered by trauma or stress. What increases the risk? The following factors may make you more likely to develop this condition: Having a family history of gout. Being male and middle-aged. Being male and having gone through menopause. Taking certain medicines, including aspirin, cyclosporine, diuretics, levodopa, and niacin. Having an organ transplant. Having certain conditions, such as: Being obese. Lead poisoning. Kidney disease. A skin condition called psoriasis. Other factors include: Losing weight too quickly. Being dehydrated. Frequently drinking alcohol, especially beer. Frequently drinking beverages that are sweetened with a type of sugar called  fructose. What are the signs or symptoms? An attack of acute gout happens quickly. It usually occurs in just one joint. The most common place is the big toe. Attacks often start at night. Other joints that may be affected include joints of the feet, ankle, knee, fingers, wrist, or elbow. Symptoms of this condition may include: Severe pain. Warmth. Swelling. Stiffness. Tenderness. The affected joint may be very painful to touch. Shiny, red, or purple skin. Chills and fever. Chronic gout may cause symptoms more frequently. More joints may be involved. You may also have white or yellow lumps (tophi) on your hands or feet or in other areas near your joints. How is this diagnosed? This condition is diagnosed based on your symptoms, your medical history, and a physical exam. You may have tests, such as: Blood tests to measure uric acid levels. Removal of joint fluid with a thin needle (aspiration) to look for uric acid crystals. X-rays to look for joint damage. How is this treated? Treatment for this condition has two phases: treating an acute attack and preventing future attacks. Acute gout treatment may include medicines to reduce pain and swelling, including: NSAIDs, such as ibuprofen. Steroids. These are strong anti-inflammatory medicines that can be taken by mouth (orally) or injected into a joint. Colchicine. This medicine relieves pain and swelling when it is taken soon after an attack. It can be given by mouth or through an IV. Preventive treatment may include: Daily use of smaller doses of NSAIDs or colchicine. Use of a medicine that reduces uric acid levels in your blood, such as allopurinol. Changes to your diet. You may need to see   a dietitian about what to eat and drink to prevent gout. Follow these instructions at home: During a gout attack  If directed, put ice on the affected area. To do this: Put ice in a plastic bag. Place a towel between your skin and the bag. Leave the  ice on for 20 minutes, 2-3 times a day. Remove the ice if your skin turns bright red. This is very important. If you cannot feel pain, heat, or cold, you have a greater risk of damage to the area. Raise (elevate) the affected joint above the level of your heart as often as possible. Rest the joint as much as possible. If the affected joint is in your leg, you may be given crutches to use. Follow instructions from your health care provider about eating or drinking restrictions. Avoiding future gout attacks Follow a low-purine diet as told by your dietitian or health care provider. Avoid foods and drinks that are high in purines, including liver, kidney, anchovies, asparagus, herring, mushrooms, mussels, and beer. Maintain a healthy weight or lose weight if you are overweight. If you want to lose weight, talk with your health care provider. Do not lose weight too quickly. Start or maintain an exercise program as told by your health care provider. Eating and drinking Avoid drinking beverages that contain fructose. Drink enough fluids to keep your urine pale yellow. If you drink alcohol: Limit how much you have to: 0-1 drink a day for women who are not pregnant. 0-2 drinks a day for men. Know how much alcohol is in a drink. In the [place] one drink equals one 12 oz bottle of beer (355 mL), one 5 oz glass of wine (148 mL), or one 1 oz glass of hard liquor (44 mL). General instructions Take over-the-counter and prescription medicines only as told by your health care provider. Ask your health care provider if the medicine prescribed to you requires you to avoid driving or using machinery. Return to your normal activities as told by your health care provider. Ask your health care provider what activities are safe for you. Keep all follow-up visits. This is important. Where to find more information National Institutes of Health: www.niams.nih.gov Contact a health care provider if you have: Another  gout attack. Continuing symptoms of a gout attack after 10 days of treatment. Side effects from your medicines. Chills or a fever. Burning pain when you urinate. Pain in your lower back or abdomen. Get help right away if you: Have severe or uncontrolled pain. Cannot urinate. Summary Gout is painful swelling of the joints caused by having too much uric acid in the body. The most common site for gout to occur is in the big toe, but it can affect other joints in the body. Medicines and dietary changes can help to prevent and treat gout attacks. This information is not intended to replace advice given to you by your health care provider. Make sure you discuss any questions you have with your health care provider. Document Revised: 01/06/2021 Document Reviewed: 01/06/2021 Elsevier Patient Education  2024 Elsevier Inc.  

## 2023-04-10 NOTE — Progress Notes (Signed)
   Acute Office Visit  Subjective:     Patient ID: Melvin Roberts, male    DOB: 1960/11/28, 62 y.o.   MRN: [id number]  Chief Complaint  Patient presents with   Toe Pain    Toe Pain  There was no injury mechanism. Pain location: right great toe. The quality of the pain is described as aching. The pain is moderate. The pain has been Constant since onset. Pertinent negatives include no loss of motion, loss of sensation, muscle weakness, numbness or tingling. Associated symptoms comments: Redness, swelling, tenderness. He reports no foreign bodies present. The symptoms are aggravated by palpation and movement. He has tried nothing for the symptoms. The treatment provided no relief.    No exudate, fever, chills. No hx of gout. Hx of CKD. Pain x 1 week, unchanged.   Review of Systems  Neurological:  Negative for tingling and numbness.        Objective:    BP 117/78   Pulse 67   Temp 98.3 F (36.8 C) (Temporal)   Ht 5\' 10"  (1.778 m)   Wt 219 lb 9.6 oz (99.6 kg)   SpO2 96%   BMI 31.51 kg/m    Physical Exam Vitals and nursing note reviewed.  Constitutional:      General: He is not in acute distress.    Appearance: He is not ill-appearing, toxic-appearing or diaphoretic.  Pulmonary:     Effort: Pulmonary effort is normal. No respiratory distress.  Feet:     Comments: Generalized swelling to right great toe with tenderness, erythema, and warmth. No exudate, induration, or fluctuance. ROM and sensation intact. Brisk cap refill.  Skin:    General: Skin is warm and dry.  Neurological:     General: No focal deficit present.     Mental Status: He is alert and oriented to person, place, and time.  Psychiatric:        Mood and Affect: Mood normal.        Behavior: Behavior normal.     No results found for any visits on 04/10/23.      Assessment & Plan:   Melvin "[name]" was seen today for toe pain.  Diagnoses and all orders for this visit:  Great toe pain,  right Suspect gout as he does have a hx of CKD. Declined prednisone today due to side effects. Naproxen BID as below. Discussed to limit use of naproxen to no longer than 1-2 weeks due to hx of CKD and HTN. Do not take other NSAIDs with naproxen. UC level pending. Return to office for new or worsening symptoms, or if symptoms persist.  -     naproxen sodium (ANAPROX DS) 550 MG tablet; Take 1 tablet (550 mg total) by mouth 2 (two) times daily with a meal. -     Uric Acid  Chronic kidney disease, stage 3a (HCC)  Primary hypertension BP at goal today.    Return if symptoms worsen or fail to improve.  Gabriel Earing, FNP

## 2023-04-10 NOTE — Telephone Encounter (Signed)
Referral has been faxed to [facility] 

## 2023-04-10 NOTE — Progress Notes (Signed)
Melvin Roberts [id number] Dec 16, 1960 62 y.o.  Virtual Visit via Video Note  I connected with pt @ on 04/10/23 at 10:20 AM EST by a video enabled telemedicine application and verified that I am speaking with the correct person using two identifiers.   I discussed the limitations of evaluation and management by telemedicine and the availability of in person appointments. The patient expressed understanding and agreed to proceed.  I discussed the assessment and treatment plan with the patient. The patient was provided an opportunity to ask questions and all were answered. The patient agreed with the plan and demonstrated an understanding of the instructions.   The patient was advised to call back or seek an in-person evaluation if the symptoms worsen or if the condition fails to improve as anticipated.  I provided 25 minutes of non-face-to-face time during this encounter.  The patient was located at home.  The provider was located at [facility].   Dorothyann Gibbs, NP   Subjective:   Patient ID:  Melvin Roberts is a 62 y.o. (DOB Sep 10, 1960) male.  Chief Complaint: No chief complaint on file.   HPI TREMIR SIEGMANN presents for follow-up of  Bipolar Disorder, GAD and insomnia.  Accompanied by wife.  Describes mood today as "about the same". Mood symptoms - reports depression, anxiety, and irritability. Denies panic attacks. Reports some worry, rumination and irritation. Mood has improved. Stating "I feel pretty even". Wife feels like he continues to struggle with memory and recall - "it is getting worse". She does not feel that he is aware it. She would like to have him evaluated by neurology as soon as possible. Currently taking Olanzapine, Depakote and Trazadone. Feels like current medication is helpful. Reports decreased interest and motivation. Taking medications as prescribed. Energy levels lower. Active, does not have a regular exercise routine - recovering  from knee replacement.  Enjoys some usual interests and activities. Married. Lives with wife. Has 5 children and 11 grandchildren. Spending time with family. Appetite adequate. Weight 210 to 212 pounds. Reports sleep has improved. Averages 7 to 8 hours. Reports focus and concentration difficulties - wife feels like it has been gradual, but worse over past few months. Completing some tasks. Managing minimal aspects of household. 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 Denies SI or HI.  Denies AH or VH. Denies self harm. Denies substance use. Reports hearing loss.  Previous medication trials: Lithium, Seroquel  Review of Systems:  Review of Systems  Musculoskeletal:  Negative for gait problem.  Neurological:  Negative for tremors.  Psychiatric/Behavioral:         Please refer to HPI    Medications: I have reviewed the patient's current medications.  Current Outpatient Medications  Medication Sig Dispense Refill   cholecalciferol (VITAMIN D) 1000 UNITS tablet Take 1,000 Units by mouth daily.     divalproex (DEPAKOTE) 500 MG DR tablet Take 1000 mg QAM and 2000 mg QHS 450 tablet 0   fluticasone (FLONASE) 50 MCG/ACT nasal spray SPRAY 2 SPRAYS INTO EACH NOSTRIL EVERY DAY 48 mL 1   methocarbamol (ROBAXIN) 500 MG tablet Take 1 tablet (500 mg total) by mouth 4 (four) times daily. 120 tablet 1   naproxen sodium (ANAPROX DS) 550 MG tablet Take 1 tablet (550 mg total) by mouth 2 (two) times daily with a meal. 30 tablet 0   OLANZapine (ZYPREXA) 5 MG tablet Take 1 tablet (5 mg total) by mouth at bedtime. 30 tablet  2   pantoprazole (PROTONIX) 40 MG tablet Take 1 tablet (40 mg total) by mouth daily. 90 tablet 3   traZODone (DESYREL) 100 MG tablet Take 1 tablet (100 mg total) by mouth at bedtime. 30 tablet 2   No current facility-administered medications for this visit.    Medication Side Effects: None  Allergies:  Allergies  Allergen Reactions   Lipitor  [Atorvastatin]    Niacin    Niaspan [Niacin Er (Antihyperlipidemic)]     Past Medical History:  Diagnosis Date   Bipolar 1 disorder (HCC)    Hyperlipidemia    Hypertension     Family History  Problem Relation Age of Onset   Cancer Mother        breast   Cancer Father        lug   Cancer Maternal Grandfather    Cancer Paternal Grandfather    Colon cancer Neg Hx     Social History   Socioeconomic History   Marital status: Married    Spouse name: Not on file   Number of children: Not on file   Years of education: Not on file   Highest education level: Not on file  Occupational History   Not on file  Tobacco Use   Smoking status: Never   Smokeless tobacco: Never  Vaping Use   Vaping status: Never Used  Substance and Sexual Activity   Alcohol use: No   Drug use: No   Sexual activity: Not on file  Other Topics Concern   Not on file  Social History Narrative   Not on file   Social Drivers of Health   Financial Resource Strain: Not on file  Food Insecurity: Not on file  Transportation Needs: Not on file  Physical Activity: Not on file  Stress: Not on file  Social Connections: Not on file  Intimate Partner Violence: Not on file    Past Medical History, Surgical history, Social history, and Family history were reviewed and updated as appropriate.   Please see review of systems for further details on the patient's review from today.   Objective:   Physical Exam:  There were no vitals taken for this visit.  Physical Exam Constitutional:      General: He is not in acute distress. Musculoskeletal:        General: No deformity.  Neurological:     Mental Status: He is alert and oriented to person, place, and time.     Coordination: Coordination normal.  Psychiatric:        Attention and Perception: Attention and perception normal. He does not perceive auditory or visual hallucinations.        Mood and Affect: Affect is flat. Affect is not labile, blunt,  angry or inappropriate.        Behavior: Behavior is slowed. Behavior is cooperative.        Thought Content: Thought content normal. Thought content is not paranoid or delusional. Thought content does not include homicidal or suicidal ideation. Thought content does not include homicidal or suicidal plan.        Cognition and Memory: Cognition is impaired.     Comments: Insight intact     Lab Review:     Component Value Date/Time   NA 140 04/07/2023 1017   K 5.1 04/07/2023 1017   CL 104 04/07/2023 1017   CO2 21 04/07/2023 1017   GLUCOSE 80 04/07/2023 1017   GLUCOSE 117 (H) 02/26/2022 2205   BUN 18 04/07/2023 1017  CREATININE 1.40 (H) 04/07/2023 1017   CALCIUM 10.4 (H) 04/07/2023 1017   PROT 6.6 04/07/2023 1017   ALBUMIN 4.2 04/07/2023 1017   AST 44 (H) 04/07/2023 1017   ALT 32 04/07/2023 1017   ALKPHOS 55 04/07/2023 1017   BILITOT 0.3 04/07/2023 1017   GFRNONAA >60 02/26/2022 2205   GFRAA 61 05/29/2020 0854       Component Value Date/Time   WBC 9.1 02/26/2022 2205   RBC 4.00 (L) 02/26/2022 2205   HGB 12.4 (L) 02/26/2022 2205   HGB 12.8 (L) 01/27/2022 1529   HCT 37.3 (L) 02/26/2022 2205   HCT 37.3 (L) 01/27/2022 1529   PLT 241 02/26/2022 2205   PLT 303 01/27/2022 1529   MCV 93.3 02/26/2022 2205   MCV 91 01/27/2022 1529   MCH 31.0 02/26/2022 2205   MCHC 33.2 02/26/2022 2205   RDW 15.7 (H) 02/26/2022 2205   RDW 14.3 01/27/2022 1529   LYMPHSABS 2.0 02/26/2022 2205   LYMPHSABS 2.2 01/27/2022 1529   MONOABS 0.6 02/26/2022 2205   EOSABS 0.1 02/26/2022 2205   EOSABS 0.1 01/27/2022 1529   BASOSABS 0.0 02/26/2022 2205   BASOSABS 0.0 01/27/2022 1529    Lithium Lvl  Date Value Ref Range Status  06/09/2022 1.3 (HH) 0.5 - 1.2 mmol/L Final    Comment:    A concentration of 0.5-0.8 mmol/L is advised for long-term use; concentrations of up to 1.2 mmol/L may be necessary during acute treatment.                                  Detection Limit = 0.1                            <0.1 indicates None Detected Patient drug level exceeds published reference range.  Evaluate clinically for signs of potential toxicity.      Lab Results  Component Value Date   VALPROATE 109 (H) 04/04/2023     .res Assessment: Plan:    Plan:  PDMP reviewed  Zyprexa 5mg  at hs Trazadone 100mg  at hs Depakote 1500 BID - change to 1000mg  in the morning and 2000 at bedtime.  RTC 3 weeks   Patient totally disabled an unable to work 03/22/2023 through 06/02/2022.  Will refer to neurology for cognitive evaluation - neuropsychiatric testing.  Time spent with patient was 25 minutes. Greater than 50% of face to face time with patient was spent on counseling and coordination of care. Discussed disability with current mental health issues. Patient unable to complete job functions with current mental health instability.  Discussed potential metabolic side effects associated with atypical antipsychotics, as well as potential risk for movement side effects. Advised pt to contact office if movement side effects occur.    Patient advised to contact office with any questions, adverse effects, or acute worsening in signs and symptoms.  Diagnoses and all orders for this visit:  Bipolar I disorder (HCC) -     OLANZapine (ZYPREXA) 5 MG tablet; Take 1 tablet (5 mg total) by mouth at bedtime.  Generalized anxiety disorder  Insomnia, unspecified type -     traZODone (DESYREL) 100 MG tablet; Take 1 tablet (100 mg total) by mouth at bedtime.     Please see After Visit Summary for patient specific instructions.  Future Appointments  Date Time Provider Department Center  05/09/2023  1:10 PM Mechele Claude, MD [facility]  None    No orders of the defined types were placed in this encounter.     -------------------------------

## 2023-04-11 LAB — URIC ACID: Uric Acid: 8.6 mg/dL — ABNORMAL HIGH (ref 3.8–8.4)

## 2023-04-20 ENCOUNTER — Encounter: Payer: Self-pay | Admitting: Neurology

## 2023-04-20 ENCOUNTER — Ambulatory Visit: Payer: BC Managed Care – PPO | Admitting: Neurology

## 2023-04-20 VITALS — BP 159/88 | HR 61 | Ht 71.0 in | Wt 227.0 lb

## 2023-04-20 DIAGNOSIS — G309 Alzheimer's disease, unspecified: Secondary | ICD-10-CM

## 2023-04-20 DIAGNOSIS — Z8659 Personal history of other mental and behavioral disorders: Secondary | ICD-10-CM | POA: Diagnosis not present

## 2023-04-20 DIAGNOSIS — R260 Ataxic gait: Secondary | ICD-10-CM | POA: Diagnosis not present

## 2023-04-20 DIAGNOSIS — R413 Other amnesia: Secondary | ICD-10-CM | POA: Diagnosis not present

## 2023-04-20 DIAGNOSIS — R41840 Attention and concentration deficit: Secondary | ICD-10-CM | POA: Diagnosis not present

## 2023-04-20 DIAGNOSIS — R251 Tremor, unspecified: Secondary | ICD-10-CM | POA: Diagnosis not present

## 2023-04-20 NOTE — Patient Instructions (Signed)
 There are well-accepted and sensible ways to reduce risk for Alzheimers disease and other degenerative brain disorders .  Exercise Daily Walk A daily 20 minute walk should be part of your routine. Disease related apathy can be a significant roadblock to exercise and the only way to overcome this is to make it a daily routine and perhaps have a reward at the end (something your loved one loves to eat or drink perhaps) or a personal trainer coming to the home can also be very useful. Most importantly, the patient is much more likely to exercise if the caregiver / spouse does it with him/her. In general a structured, repetitive schedule is best.  General Health: Any diseases which effect your body will effect your brain such as a pneumonia, urinary infection, blood clot, heart attack or stroke. Keep contact with your primary care doctor for regular follow ups.  Sleep. A good nights sleep is healthy for the brain. Seven hours is recommended. If you have insomnia or poor sleep habits we can give you some instructions. If you have sleep apnea wear your mask.  Diet: Eating a heart healthy diet is also a good idea; fish and poultry instead of red meat, nuts (mostly non-peanuts), vegetables, fruits, olive oil or canola oil (instead of butter), minimal salt (use other spices to flavor foods), whole grain rice, bread, cereal and pasta and wine in moderation.Research is now showing that the MIND diet, which is a combination of The Mediterranean diet and the DASH diet, is beneficial for cognitive processing and longevity. Information about this diet can be found in The MIND Diet, a book by Annitta Feeling, MS, RDN, and online at wildwildscience.es  Finances, Power of [address] and Advance Directives: You should consider putting legal safeguards in place with regard to financial and medical decision making. While the spouse always has power of attorney for medical and financial issues in the  absence of any form, you should consider what you want in case the spouse / caregiver is no longer around or capable of making decisions.   The Alzheimers Association Position on Disease Prevention  Can Alzheimer's be prevented? It's a question that continues to intrigue researchers and fuel new investigations. There are no clear-cut answers yet -- partially due to the need for more large-scale studies in diverse populations -- but promising research is under way. The Alzheimer's Association is leading the worldwide effort to find a treatment for Alzheimer's, delay its onset and prevent it from developing.   What causes Alzheimer's? Experts agree that in the vast majority of cases, Alzheimer's, like other common chronic conditions, probably develops as a result of complex interactions among multiple factors, including age, genetics, environment, lifestyle and coexisting medical conditions. Although some risk factors -- such as age or genes -- cannot be changed, other risk factors -- such as high blood pressure and lack of exercise -- usually can be changed to help reduce risk. Research in these areas may lead to new ways to detect those at highest risk.  Prevention studies A small percentage of people with Alzheimer's disease (less than 1 percent) have an early-onset type associated with genetic mutations. Individuals who have these genetic mutations are guaranteed to develop the disease. An ongoing clinical trial conducted by the [facility] ([facility]), is testing whether antibodies to beta-amyloid can reduce the accumulation of beta-amyloid plaque in the brains of people with such genetic mutations and thereby reduce, delay or prevent symptoms. Participants in the trial are receiving  antibodies (or placebo) before they develop symptoms, and the development of beta-amyloid plaques is being monitored by brain scans and other tests.  Another clinical trial, known as the A4 trial  (Anti-Amyloid Treatment in Asymptomatic Alzheimer's), is testing whether antibodies to beta-amyloid can reduce the risk of Alzheimer's disease in older people (ages 75 to 30) at high risk for the disease. The A4 trial is being conducted by the Alzheimer's Disease Cooperative Study.  Though research is still evolving, evidence is strong that people can reduce their risk by making key lifestyle changes, including participating in regular activity and maintaining good heart health. Based on this research, the Alzheimer's Association offers 10 Ways to Love Your Brain -- a collection of tips that can reduce the risk of cognitive decline.  Heart-head connection  New research shows there are things we can do to reduce the risk of mild cognitive impairment and dementia.  Several conditions known to increase the risk of cardiovascular disease -- such as high blood pressure, diabetes and high cholesterol -- also increase the risk of developing Alzheimer's. Some autopsy studies show that as many as 80 percent of individuals with Alzheimer's disease also have cardiovascular disease.  A longstanding question is why some people develop hallmark Alzheimer's plaques and tangles but do not develop the symptoms of Alzheimer's. Vascular disease may help researchers eventually find an answer. Some autopsy studies suggest that plaques and tangles may be present in the brain without causing symptoms of cognitive decline unless the brain also shows evidence of vascular disease. More research is needed to better understand the link between vascular health and Alzheimer's.  Physical exercise and diet Regular physical exercise may be a beneficial strategy to lower the risk of Alzheimer's and vascular dementia. Exercise may directly benefit brain cells by increasing blood and oxygen flow in the brain. Because of its known cardiovascular benefits, a medically approved exercise program is a valuable part of any overall wellness  plan.  Current evidence suggests that heart-healthy eating may also help protect the brain. Heart-healthy eating includes limiting the intake of sugar and saturated fats and making sure to eat plenty of fruits, vegetables, and whole grains. No one diet is best. Two diets that have been studied and may be beneficial are the DASH (Dietary Approaches to Stop Hypertension) diet and the Mediterranean diet. The DASH diet emphasizes vegetables, fruits and fat-free or low-fat dairy products; includes whole grains, fish, poultry, beans, seeds, nuts and vegetable oils; and limits sodium, sweets, sugary beverages and red meats. A Mediterranean diet includes relatively little red meat and emphasizes whole grains, fruits and vegetables, fish and shellfish, and nuts, olive oil and other healthy fats.  Social connections and intellectual activity A number of studies indicate that maintaining strong social connections and keeping mentally active as we age might lower the risk of cognitive decline and Alzheimer's. Experts are not certain about the reason for this association. It may be due to direct mechanisms through which social and mental stimulation strengthen connections between nerve cells in the brain.  Head trauma There appears to be a strong link between future risk of Alzheimer's and serious head trauma, especially when injury involves loss of consciousness. You can help reduce your risk of Alzheimer's by protecting your head.  Wear a seat belt  Use a helmet when participating in sports  Fall-proof your home   What you can do now While research is not yet conclusive, certain lifestyle choices, such as physical activity and diet, may help support  brain health and prevent Alzheimer's. Many of these lifestyle changes have been shown to lower the risk of other diseases, like heart disease and diabetes, which have been linked to Alzheimer's. With few drawbacks and plenty of known benefits, healthy lifestyle  choices can improve your health and possibly protect your brain.  Learn more about brain health. You can help increase our knowledge by considering participation in a clinical study. Our free clinical trial matching services, TrialMatch, can help you find clinical trials in your area that are seeking volunteers.  Understanding prevention research Here are some things to keep in mind about the research underlying much of our current knowledge about possible prevention:  Insights about potentially modifiable risk factors apply to large population groups, not to individuals. Studies can show that factor X is associated with outcome Y, but cannot guarantee that any specific person will have that outcome. As a result, you can do everything right and still have a serious health problem or do everything wrong and live to be 100.  Much of our current evidence comes from large epidemiological studies such as the [place] Aging Study, the Nurses' Health Study, the Adult Changes in Thought Study and the [employer]. These studies explore pre-existing behaviors and use statistical methods to relate those behaviors to health outcomes. This type of study can show an association between a factor and an outcome but cannot prove cause and effect. This is why we describe evidence based on these studies with such language as suggests, may show, might protect, and is associated with.  The gold standard for showing cause and effect is a clinical trial in which participants are randomly assigned to a prevention or risk management strategy or a control group. Researchers follow the two groups over time to see if their outcomes differ significantly.  It is unlikely that some prevention or risk management strategies will ever be tested in randomized trials for ethical or practical reasons. One example is exercise. Definitively testing the impact of exercise on Alzheimer's risk would require a huge  trial enrolling thousands of people and following them for many years. The expense and logistics of such a trial would be prohibitive, and it would require some people to go without exercise, a known health benefit.   Management of Memory Problems  There are some general things you can do to help manage your memory problems.  Your memory may not in fact recover, but by using techniques and strategies you will be able to manage your memory difficulties better.  1)  Establish a routine. Try to establish and then stick to a regular routine.  By doing this, you will get used to what to expect and you will reduce the need to rely on your memory.  Also, try to do things at the same time of day, such as taking your medication or checking your calendar first thing in the morning. Think about think that you can do as a part of a regular routine and make a list.  Then enter them into a daily planner to remind you.  This will help you establish a routine.  2)  Organize your environment. Organize your environment so that it is uncluttered.  Decrease visual stimulation.  Place everyday items such as keys or cell phone in the same place every day (ie.  Basket next to front door) Use post it notes with a brief message to yourself (ie. Turn off light, lock the door) Use labels to indicate where things go (ie.  Which cupboards are for food, dishes, etc.) Keep a notepad and pen by the telephone to take messages  3)  Memory Aids A diary or journal/notebook/daily planner Making a list (shopping list, chore list, to do list that needs to be done) Using an alarm as a reminder (kitchen timer or cell phone alarm) Using cell phone to store information (Notes, Calendar, Reminders) Calendar/White board placed in a prominent position Post-it notes  In order for memory aids to be useful, you need to have good habits.  It's no good remembering to make a note in your journal if you don't remember to look in it.  Try setting  aside a certain time of day to look in journal.  4)  Improving mood and managing fatigue. There may be other factors that contribute to memory difficulties.  Factors, such as anxiety, depression and tiredness can affect memory. Regular gentle exercise can help improve your mood and give you more energy. Simple relaxation techniques may help relieve symptoms of anxiety Try to get back to completing activities or hobbies you enjoyed doing in the past. Learn to pace yourself through activities to decrease fatigue. Find out about some local support groups where you can share experiences with others. Try and achieve 7-8 hours of sleep at night.Mild Neurocognitive Disorder Mild neurocognitive disorder, formerly known as mild cognitive impairment, is a disorder in which memory does not work as well as it should. This disorder may also cause problems with other mental functions, including thought, communication, behavior, and completion of tasks. These problems can be noticed and measured, but they usually do not interfere with daily activities or the ability to live independently. Mild neurocognitive disorder typically develops after 63 years of age, but it can also develop at younger ages. It is not as serious as major neurocognitive disorder, also known as dementia, but it may be the first sign of it. Generally, symptoms of this condition get worse over time. In rare cases, symptoms can get better. What are the causes? This condition may be caused by: Brain disorders like Alzheimer's disease, Parkinson's disease, and other conditions that gradually damage nerve cells (neurodegenerative conditions). Diseases that affect blood vessels in the brain and result in small strokes. Certain infections, such as HIV. Traumatic brain injury. Other medical conditions, such as brain tumors, underactive thyroid  (hypothyroidism), and vitamin B12 deficiency. Use of certain drugs or prescription medicines. What  increases the risk? The following factors may make you more likely to develop this condition: Being older than 65 years. Being male. Low education level. Diabetes, high blood pressure, high cholesterol, and other conditions that increase the risk for blood vessel diseases. Untreated or undertreated sleep apnea. Having a certain type of gene that can be passed from parent to child (inherited). Chronic health problems such as heart disease, lung disease, liver disease, kidney disease, or depression. What are the signs or symptoms? Symptoms of this condition include: Difficulty remembering. You may: Forget names, phone numbers, or details of recent events. Forget social events and appointments. Repeatedly forget where you put your car keys or other items. Difficulty thinking and solving problems. You may have trouble with complex tasks, such as: Paying bills. Driving in unfamiliar places. Difficulty communicating. You may have trouble: Finding the right word or naming an object. Forming a sentence that makes sense, or understanding what you read or hear. Changes in your behavior or personality. When this happens, you may: Lose interest in the things that you used to enjoy. Withdraw from social  situations. Get angry more easily than usual. Act before thinking. How is this diagnosed? This condition is diagnosed based on: Your symptoms. Your health care provider may ask you and the people you spend time with, such as family and friends, about your symptoms. Evaluation of mental functions (neuropsychological testing). Your health care provider may refer you to a neurologist or mental health specialist to evaluate your mental functions in detail. To identify the cause of your condition, your health care provider may: Get a detailed medical history. Ask about use of alcohol, drugs, and prescription medicines. Do a physical exam. Order blood tests and brain imaging exams. How is this  treated? Mild neurocognitive disorder that is caused by medicine use, drug use, infection, or another medical condition may improve when the cause is treated, or when medicines or drugs are stopped. If this disorder has another cause, it generally does not improve and may get worse. In these cases, the goal of treatment is to help you manage the loss of mental function. Treatments in these cases include: Medicine. Medicine mainly helps memory and behavior symptoms. Talk therapy. Talk therapy provides education, emotional support, memory aids, and other ways of making up for problems with mental function. Lifestyle changes, including: Getting regular exercise. Eating a healthy diet that includes omega-3 fatty acids. Challenging your thinking and memory skills. Having more social interaction. Follow these instructions at home: Eating and drinking  Drink enough fluid to keep your urine pale yellow. Eat a healthy diet that includes omega-3 fatty acids. These can be found in: Fish. Nuts. Leafy vegetables. Vegetable oils. If you drink alcohol: Limit how much you use to: 0-1 drink a day for women. 0-2 drinks a day for men. Be aware of how much alcohol is in your drink. In the [place] one drink equals one 12 oz bottle of beer (355 mL), one 5 oz glass of wine (148 mL), or one 1 oz glass of hard liquor (44 mL). Lifestyle  Get regular exercise as told by your health care provider. Do not use any products that contain nicotine or tobacco, such as cigarettes, e-cigarettes, and chewing tobacco. If you need help quitting, ask your health care provider. Practice ways to manage stress. If you need help managing stress, ask your health care provider. Continue to have social interaction. Keep your mind active with stimulating activities you enjoy, such as reading or playing games. Make sure to get quality sleep. Follow these tips: Avoid napping during the day. Keep your sleeping area dark and  cool. Avoid exercising during the few hours before you go to bed. Avoid caffeine products in the evening. General instructions Take over-the-counter and prescription medicines only as told by your health care provider. Your health care provider may recommend that you avoid taking medicines that can affect thinking, such as pain medicines or sleep medicines. Work with your health care provider to find out what you need help with and what your safety needs are. Keep all follow-up visits. This is important. Where to find more information [employer] on Aging: https://walker.com/ Contact a health care provider if: You have any new symptoms. Get help right away if: You develop new confusion or your confusion gets worse. You act in ways that place you or your family in danger. Summary Mild neurocognitive disorder is a disorder in which memory does not work as well as it should. Mild neurocognitive disorder can have many causes. It may be the first stage of dementia. To manage your condition, get regular  exercise, keep your mind active, get quality sleep, and eat a healthy diet. This information is not intended to replace advice given to you by your health care provider. Make sure you discuss any questions you have with your health care provider. Document Revised: 08/19/2019 Document Reviewed: 08/19/2019 Elsevier Patient Education  2024 [employer].

## 2023-04-20 NOTE — Progress Notes (Signed)
 NEUROLOGY CLINIC    Provider:  [name], MD  Primary Care Physician:  Zollie Lowers, MD [address] [place] [address]     Referring Provider: Psychiatry Office referral   Mozingo, Angeline Mattocks, Np [address] [place],  [place] [address]          Chief Complaint according to patient   Patient presents with:     New Patient (Initial Visit)           HISTORY OF PRESENT ILLNESS:  Melvin Roberts is a 63 y.o. male patient who is seen upon referral on 04/20/2023 from PCP.  Chief concern according to patient :  I started to have a lot of trouble after I had a partial knee replacement May 2023, and sine then I ave been having progressive STM loss, getting confused.  His wife interjects that all these confusional spells started earlier, he had to write everything down,  since 2022- he missed things at work, and has been out of work since 04-2021.   He developed tremor in the right had while still at work and now both hands are affected. He sometimes had to hold the right hand with the left to stabilize it, and he worked as education administrator. ( [employer] now [employer]).     I have the pleasure of seeing Melvin Roberts 04/20/23 a right -handed male with a concern of declining cognitive function over a period of now 3 years - slowly progressive, more notable since he has been at home for 12 months now.    Family history of BIPOLAR disorder , Maternal GM, mother was not affected.  Father had bipolar depression or major depression.  Patient was dx at age 49 with bpiolar.   On depakote  and used to be in Lithium .      Review of Systems: Out of a complete 14 system review, the patient complains of only the following symptoms, and all other reviewed systems are negative.:  Tremor , shuffling gait. STM loss.   GOUTY arthritis, BiPOLAR       04/20/2023    9:39 AM 03/22/2023    4:39 PM  MMSE - Mini Mental State Exam  Orientation to time 5    Orientation to Place 5   Registration 3   Attention/ Calculation 1   Recall 3   Language- name 2 objects 2   Language- repeat 1   Language- follow 3 step command 3   Language- read & follow direction 0   Write a sentence 1   Copy design 1   Total score 25      Information is confidential and restricted. Go to Review Flowsheets to unlock data.      04/20/2023    9:32 AM  Montreal Cognitive Assessment   Orientation (0/6) 6   24/ 30 points.    Social History   Socioeconomic History   Marital status: Married    Spouse name: Not on file   Number of children: Not on file   Years of education: Not on file   Highest education level: Not on file  Occupational History   Not on file  Tobacco Use   Smoking status: Never   Smokeless tobacco: Never  Vaping Use   Vaping status: Never Used  Substance and Sexual Activity   Alcohol use: No   Drug use: No   Sexual activity: Not on file  Other Topics Concern   Not  on file  Social History Narrative   Not on file   Social Drivers of Health   Financial Resource Strain: Not on file  Food Insecurity: Not on file  Transportation Needs: Not on file  Physical Activity: Not on file  Stress: Not on file  Social Connections: Not on file    Family History  Problem Relation Age of Onset   Cancer Mother        breast   Cancer Father        lug   Cancer Maternal Grandfather    Cancer Paternal Grandfather    Colon cancer Neg Hx     Past Medical History:  Diagnosis Date   Bipolar 1 disorder (HCC)    Hyperlipidemia    Hypertension     Past Surgical History:  Procedure Laterality Date   BACK SURGERY  11/17/95   Dr. Duwayne   EYE SURGERY     HERNIA REPAIR  2009   umbilical   UPPER GASTROINTESTINAL ENDOSCOPY  x2   w/ esophageal dilation     Current Outpatient Medications on File Prior to Visit  Medication Sig Dispense Refill   cholecalciferol (VITAMIN D ) 1000 UNITS tablet Take 1,000 Units by mouth daily.     divalproex   (DEPAKOTE ) 500 MG DR tablet Take 1000 mg QAM and 2000 mg QHS (Patient taking differently: Take 500 mg by mouth 3 (three) times daily. Take 1000 mg QAM and 1500 mg QHS) 450 tablet 0   fluticasone  (FLONASE ) 50 MCG/ACT nasal spray SPRAY 2 SPRAYS INTO EACH NOSTRIL EVERY DAY 48 mL 1   methocarbamol  (ROBAXIN ) 500 MG tablet Take 1 tablet (500 mg total) by mouth 4 (four) times daily. 120 tablet 1   naproxen  sodium (ANAPROX  DS) 550 MG tablet Take 1 tablet (550 mg total) by mouth 2 (two) times daily with a meal. 30 tablet 0   OLANZapine  (ZYPREXA ) 5 MG tablet Take 1 tablet (5 mg total) by mouth at bedtime. 30 tablet 2   pantoprazole  (PROTONIX ) 40 MG tablet Take 1 tablet (40 mg total) by mouth daily. 90 tablet 3   traZODone  (DESYREL ) 100 MG tablet Take 1 tablet (100 mg total) by mouth at bedtime. (Patient taking differently: Take 100 mg by mouth at bedtime. Take an half of pill at night.) 30 tablet 2   No current facility-administered medications on file prior to visit.    Allergies  Allergen Reactions   Lipitor [Atorvastatin]    Niacin    Niaspan [Niacin Er (Antihyperlipidemic)]      DIAGNOSTIC DATA (LABS, IMAGING, TESTING) - I reviewed patient records, labs, notes, testing and imaging myself where available.  Lab Results  Component Value Date   WBC 9.1 02/26/2022   HGB 12.4 (L) 02/26/2022   HCT 37.3 (L) 02/26/2022   MCV 93.3 02/26/2022   PLT 241 02/26/2022      Component Value Date/Time   NA 140 04/07/2023 1017   K 5.1 04/07/2023 1017   CL 104 04/07/2023 1017   CO2 21 04/07/2023 1017   GLUCOSE 80 04/07/2023 1017   GLUCOSE 117 (H) 02/26/2022 2205   BUN 18 04/07/2023 1017   CREATININE 1.40 (H) 04/07/2023 1017   CALCIUM  10.4 (H) 04/07/2023 1017   PROT 6.6 04/07/2023 1017   ALBUMIN 4.2 04/07/2023 1017   AST 44 (H) 04/07/2023 1017   ALT 32 04/07/2023 1017   ALKPHOS 55 04/07/2023 1017   BILITOT 0.3 04/07/2023 1017   GFRNONAA >60 02/26/2022 2205   GFRAA 61 05/29/2020 0854  Lab  Results  Component Value Date   CHOL 172 07/28/2021   HDL 33 (L) 07/28/2021   LDLCALC 105 (H) 07/28/2021   TRIG 195 (H) 07/28/2021   CHOLHDL 5.2 (H) 07/28/2021   Lab Results  Component Value Date   HGBA1C 5.2 11/04/2019   No results found for: [id number] Lab Results  Component Value Date   TSH 1.563 02/26/2022    PHYSICAL EXAM:  Today's Vitals   04/20/23 0928  BP: (!) 159/88  Pulse: 61  Weight: 227 lb (103 kg)  Height: 5' 11 (1.803 m)   Body mass index is 31.66 kg/m.   Wt Readings from Last 3 Encounters:  04/20/23 227 lb (103 kg)  04/10/23 219 lb 9.6 oz (99.6 kg)  01/13/23 212 lb 4 oz (96.3 kg)     Ht Readings from Last 3 Encounters:  04/20/23 5' 11 (1.803 m)  04/10/23 5' 10 (1.778 m)  01/13/23 5' 10 (1.778 m)      General: The patient is awake, alert and appears not in acute distress. The patient is well groomed. Head: Normocephalic, atraumatic. Neck is supple. Mallampati 2,  neck circumference:16. 25  inches . Nasal airflow is patent.  Retrognathia is not seen.  Dental status: Biological  Cardiovascular:  Regular rate and cardiac rhythm by pulse,  without distended neck veins. Respiratory: Lungs are clear to auscultation.  Skin:  Without evidence of ankle edema, or rash. Trunk: The patient's posture is erect.   NEUROLOGIC EXAM: The patient is awake and alert, oriented to place and time.   Memory subjective described as intact.  Attention span & concentration ability appears normal.  Speech is fluent,  without  dysarthria, dysphonia or aphasia.  Mood and affect are appropriate.   Cranial nerves: no loss of smell or taste reported  Pupils are equal and briskly reactive to light. Funduscopic exam normal .  Extraocular movements with coarse saccades-  nystagmus. No Diplopia. Visual fields by finger perimetry are intact. Hearing was intact to soft voice and finger rubbing.    Facial sensation intact to fine touch. NO FACIAL MASKING>   Facial motor  strength is symmetric and tongue and uvula move midline.  Neck ROM : rotation, tilt and flexion extension were normal for age and shoulder shrug was symmetrical.    Motor exam:  Symmetric bulk, tone and ROM. The  right hip flexion a bit weaker sie kcnee surgery.  Normal tone without cog wheeling, symmetric grip strength -.   Sensory:  Fine touch and vibration normal. Normal rinne- weber-  Proprioception tested in the upper extremities was normal.   Coordination: Rapid alternating movements in the fingers/hands were of bilaterally slowed speed.  The Finger-to-nose maneuver was performed  with mild dysmetria and  tremor.   Gait and station: Patient could rise unassisted from a seated position, walked without assistive device.  He walks with his feet pointed outwards,  he has a duck gait and he turned with 4 steps, cannot do tandem gait - ataxia. Stance is of wider base . Swaying .  Toe walk was impaired.  Deep tendon reflexes: in the upper extremities are symmetric and intact.  Absent patella and absent achilles tendon reflex.  Babinski response was deferred.    ASSESSMENT AND PLAN 63 year- old male patient of Dr Zollie  here without previous record of memory tests by PCP, had MMSE with NP Mozenga.   Hearing loss bilateral, has hearing aids but these are not used. -  :  1) severe short term memory loss.   2) non parkinsonian tremor.  He has been on lithium  for much of his life treatment of bipolar depression ,dx at age 66.   3)  ataxia of gait .  4) abnormal eye movements.    Plan :  ATN, CBC diff, CMET, TSH and  HBA1c, ANA with reflex, protein phoresis.  On Depakote  , which may be contributing to tremor.  Has been on lithium  before , but caused renal function decline.    GNA dementia- STM ,  needs Brain MRI , ATN,    I plan to follow up either personally or through our NP within 4-6 months.   I would like to thank  Mozingo, Regina Nattalie, Np [address] [place],  [place] [address] for allowing me to meet with and to take care of this pleasant patient.   CC: I will share my notes with PCP .  After spending a total time of  60 minutes face to face and additional time for physical and neurologic examination, review of laboratory studies,  personal review of imaging studies, reports and results of other testing and review of referral information / records as far as provided in visit,   Electronically signed by: [name], MD 04/20/2023 10:01 AM  [facility] Board certified by The [employer] of Sleep Medicine and Diplomate of the [employer] of [employer] Medicine. Board certified In Neurology through the ABPN, Fellow of the [employer] of Neu[employer].

## 2023-04-24 ENCOUNTER — Telehealth: Payer: Self-pay

## 2023-04-24 NOTE — Telephone Encounter (Signed)
 Copied from CRM [id number]. Topic: Appointments - Transfer of Care >> Apr 24, 2023 10:10 AM Russell PARAS wrote: Pt is requesting to transfer FROM: [name], MD Pt is requesting to transfer TO: Leron Joseph Glance Reason for requested transfer: Is needing new provider due to dissatisfaction with previous one. Is currently going through testing for bipolar and Alzheimer's. (Please call wife, Andrea, at # [phone] It is the responsibility of the team the patient would like to transfer to (Dr. Glance) to reach out to the patient if for any reason this transfer is not acceptable.

## 2023-04-25 ENCOUNTER — Telehealth: Payer: Self-pay | Admitting: Neurology

## 2023-04-25 ENCOUNTER — Telehealth: Payer: Self-pay | Admitting: *Deleted

## 2023-04-25 ENCOUNTER — Encounter: Payer: Self-pay | Admitting: Family

## 2023-04-25 NOTE — Telephone Encounter (Signed)
 Pt's wife states at last appt they were told that an order for a MRI and Neuropsychology would be put in but they haven't heard anything. Requesting call back to discuss

## 2023-04-25 NOTE — Telephone Encounter (Signed)
-----   Message from [place] Dohmeier sent at 04/24/2023  9:45 AM EST ----- Elevated Hba1c, this is higher than 3 years ago. Elevated creatinine 1.36- has risen over the period of the last  month.  High calcium - elevated potassium.  Please follow with PCP.  Get a parathyroid hormone check.

## 2023-04-25 NOTE — Telephone Encounter (Signed)
 Spoke to pt gave labwork results  Gave Pt Dr. Vickey Huger recommendations Pt is aware to call PCP due to elevated labwork . Did send PCP labwork this afternoon Pt expressed understanding and thanked me for calling

## 2023-04-26 ENCOUNTER — Telehealth: Payer: Self-pay | Admitting: Neurology

## 2023-04-26 ENCOUNTER — Other Ambulatory Visit: Payer: Self-pay | Admitting: Neurology

## 2023-04-26 MED ORDER — ALPRAZOLAM 0.5 MG PO TABS: 0.5000 mg | ORAL_TABLET | Freq: Every evening | ORAL | 0 refills | Status: DC | PRN

## 2023-04-26 NOTE — Telephone Encounter (Signed)
 Ordered medication I- pre MRI anti anxiety

## 2023-04-26 NOTE — Telephone Encounter (Signed)
 He is scheduled for the MRI at [facility] on 1/15 and is requesting xanax to take before please.

## 2023-04-26 NOTE — Addendum Note (Signed)
 Addended by: Judi Cong on: 04/26/2023 04:17 PM   Modules accepted: Orders

## 2023-04-26 NOTE — Telephone Encounter (Signed)
 Med sent for the patient to Dr Dohmeier to review and send in

## 2023-04-27 LAB — CBC WITH DIFFERENTIAL/PLATELET
Basophils Absolute: 0 10*3/uL (ref 0.0–0.2)
Basos: 1 %
EOS (ABSOLUTE): 0.2 10*3/uL (ref 0.0–0.4)
Eos: 2 %
Hematocrit: 41.2 % (ref 37.5–51.0)
Hemoglobin: 13.9 g/dL (ref 13.0–17.7)
Immature Grans (Abs): 0.1 10*3/uL (ref 0.0–0.1)
Immature Granulocytes: 1 %
Lymphocytes Absolute: 1.5 10*3/uL (ref 0.7–3.1)
Lymphs: 23 %
MCH: 31.2 pg (ref 26.6–33.0)
MCHC: 33.7 g/dL (ref 31.5–35.7)
MCV: 93 fL (ref 79–97)
Monocytes Absolute: 0.5 10*3/uL (ref 0.1–0.9)
Monocytes: 8 %
Neutrophils Absolute: 4.1 10*3/uL (ref 1.4–7.0)
Neutrophils: 65 %
Platelets: 171 10*3/uL (ref 150–450)
RBC: 4.45 x10E6/uL (ref 4.14–5.80)
RDW: 14.6 % (ref 11.6–15.4)
WBC: 6.4 10*3/uL (ref 3.4–10.8)

## 2023-04-27 LAB — ATN PROFILE
A -- Beta-amyloid 42/40 Ratio: 0.114 (ref >0.102)
Beta-amyloid 40: 246.37 pg/mL
Beta-amyloid 42: 28.12 pg/mL
N -- NfL, Plasma: 2.94 pg/mL (ref 0.00–4.61)
T -- p-tau181: 1.59 pg/mL — ABNORMAL HIGH (ref 0.00–0.97)

## 2023-04-27 LAB — COMPREHENSIVE METABOLIC PANEL
ALT: 52 [IU]/L — ABNORMAL HIGH (ref 0–44)
AST: 66 [IU]/L — ABNORMAL HIGH (ref 0–40)
Albumin: 4.4 g/dL (ref 3.9–4.9)
Alkaline Phosphatase: 59 [IU]/L (ref 44–121)
BUN/Creatinine Ratio: 18 (ref 10–24)
BUN: 25 mg/dL (ref 8–27)
Bilirubin Total: 0.2 mg/dL (ref 0.0–1.2)
CO2: 22 mmol/L (ref 20–29)
Calcium: 10.9 mg/dL — ABNORMAL HIGH (ref 8.6–10.2)
Chloride: 105 mmol/L (ref 96–106)
Creatinine, Ser: 1.36 mg/dL — ABNORMAL HIGH (ref 0.76–1.27)
Globulin, Total: 2.8 g/dL (ref 1.5–4.5)
Glucose: 94 mg/dL (ref 70–99)
Potassium: 5.3 mmol/L — ABNORMAL HIGH (ref 3.5–5.2)
Sodium: 143 mmol/L (ref 134–144)
Total Protein: 7.2 g/dL (ref 6.0–8.5)
eGFR: 59 mL/min/{1.73_m2} — ABNORMAL LOW (ref >59)

## 2023-04-27 LAB — PROTEIN ELECTROPHORESIS, SERUM
A/G Ratio: 1.1 (ref 0.7–1.7)
Albumin ELP: 3.7 g/dL (ref 2.9–4.4)
Alpha 1: 0.3 g/dL (ref 0.0–0.4)
Alpha 2: 0.7 g/dL (ref 0.4–1.0)
Beta: 1.2 g/dL (ref 0.7–1.3)
Gamma Globulin: 1.3 g/dL (ref 0.4–1.8)
Globulin, Total: 3.5 g/dL (ref 2.2–3.9)

## 2023-04-27 LAB — SEDIMENTATION RATE: Sed Rate: 9 mm/h (ref 0–30)

## 2023-04-27 LAB — METHYLMALONIC ACID, SERUM: Methylmalonic Acid: 252 nmol/L (ref 0–378)

## 2023-04-27 LAB — ANA W/REFLEX: Anti Nuclear Antibody (ANA): NEGATIVE

## 2023-04-27 LAB — HEMOGLOBIN A1C
Est. average glucose Bld gHb Est-mCnc: 117 mg/dL
Hgb A1c MFr Bld: 5.7 % — ABNORMAL HIGH (ref 4.8–5.6)

## 2023-04-27 LAB — VALPROIC ACID LEVEL: Valproic Acid Lvl: 79 ug/mL (ref 50–100)

## 2023-04-27 LAB — HOMOCYSTEINE: Homocysteine: 13.4 umol/L (ref 0.0–17.2)

## 2023-04-27 LAB — TSH+FREE T4
Free T4: 1.08 ng/dL (ref 0.82–1.77)
TSH: 1.71 u[IU]/mL (ref 0.450–4.500)

## 2023-04-27 LAB — VITAMIN B12: Vitamin B-12: 830 pg/mL (ref 232–1245)

## 2023-05-02 ENCOUNTER — Telehealth: Payer: Self-pay | Admitting: Neurology

## 2023-05-02 NOTE — Telephone Encounter (Signed)
 Pt's wife asking if can do the parathyroid screening the same day on 05/02/22 as the MRI. Would like a call back.

## 2023-05-03 ENCOUNTER — Ambulatory Visit: Payer: BC Managed Care – PPO

## 2023-05-03 DIAGNOSIS — G309 Alzheimer's disease, unspecified: Secondary | ICD-10-CM | POA: Diagnosis not present

## 2023-05-03 MED ORDER — GADOBENATE DIMEGLUMINE 529 MG/ML IV SOLN
20.0000 mL | Freq: Once | INTRAVENOUS | Status: AC | PRN
  Administered 2023-05-03: 20 mL via INTRAVENOUS

## 2023-05-04 ENCOUNTER — Encounter: Payer: BC Managed Care – PPO | Admitting: Family Medicine

## 2023-05-05 ENCOUNTER — Encounter: Payer: Self-pay | Admitting: Neurology

## 2023-05-08 ENCOUNTER — Ambulatory Visit: Payer: BC Managed Care – PPO | Admitting: Family Medicine

## 2023-05-08 ENCOUNTER — Encounter: Payer: Self-pay | Admitting: Family Medicine

## 2023-05-08 VITALS — BP 137/87 | HR 72 | Temp 97.4°F | Ht 71.0 in | Wt 237.0 lb

## 2023-05-08 DIAGNOSIS — R41840 Attention and concentration deficit: Secondary | ICD-10-CM

## 2023-05-08 DIAGNOSIS — I10 Essential (primary) hypertension: Secondary | ICD-10-CM

## 2023-05-08 DIAGNOSIS — R251 Tremor, unspecified: Secondary | ICD-10-CM

## 2023-05-08 DIAGNOSIS — F3171 Bipolar disorder, in partial remission, most recent episode hypomanic: Secondary | ICD-10-CM

## 2023-05-08 DIAGNOSIS — G20C Parkinsonism, unspecified: Secondary | ICD-10-CM | POA: Diagnosis not present

## 2023-05-08 DIAGNOSIS — R260 Ataxic gait: Secondary | ICD-10-CM

## 2023-05-08 DIAGNOSIS — R413 Other amnesia: Secondary | ICD-10-CM | POA: Diagnosis not present

## 2023-05-08 DIAGNOSIS — M10271 Drug-induced gout, right ankle and foot: Secondary | ICD-10-CM

## 2023-05-08 MED ORDER — CARBIDOPA-LEVODOPA 10-100 MG PO TABS: 1.0000 | ORAL_TABLET | Freq: Three times a day (TID) | ORAL | 5 refills | Status: DC

## 2023-05-08 MED ORDER — COLCHICINE 0.6 MG PO TABS: ORAL_TABLET | ORAL | 2 refills | Status: DC

## 2023-05-08 MED ORDER — ALLOPURINOL 100 MG PO TABS: 100.0000 mg | ORAL_TABLET | Freq: Every day | ORAL | 3 refills | Status: DC

## 2023-05-08 MED ORDER — DONEPEZIL HCL 5 MG PO TABS: 5.0000 mg | ORAL_TABLET | Freq: Every day | ORAL | 1 refills | Status: DC

## 2023-05-08 NOTE — Progress Notes (Signed)
Subjective:  Patient ID: Melvin Roberts, male    DOB: July 27, 1960  Age: 63 y.o. MRN: [id number]  CC: Gout (Right big toe. Keeps reoccurring. ), Altered Mental Status (Wife reports on going confusion. Cognitive with neuro. They suggested parathyroid work up. ), and gate (Shuffling. Seeing neurology for that as well. Hand tremors constantly. )   HPI Melvin Roberts presents for recurrent gout right great toe. Tremor, right hand onset 2 years ago. A little on the left. Has shuffle. Speeds up due to balance going forward.   Short term memory loss noted by patient and by wife. He won't remember recent events like what he had for lunch, but retains older information. He has had a tremor of the hands for a year. Worse on the right. Mild on the left.   Has shuffling gait that speeds up as he goes because he feels like he is falling forward.      05/08/2023    9:41 AM 04/10/2023    8:21 AM 12/13/2022    9:26 AM  Depression screen PHQ 2/9  Decreased Interest 1 1 1   Down, Depressed, Hopeless 1 1 1   PHQ - 2 Score 2 2 2   Altered sleeping 1 0 1  Tired, decreased energy 1 2 1   Change in appetite 0 1 0  Feeling bad or failure about yourself  1 1 0  Trouble concentrating 1 2 1   Moving slowly or fidgety/restless 1 1 0  Suicidal thoughts 1 0 0  PHQ-9 Score 8 9 5   Difficult doing work/chores Somewhat difficult Somewhat difficult Somewhat difficult    History Melvin Roberts has a past medical history of Bipolar 1 disorder (HCC), Hyperlipidemia, and Hypertension.   He has a past surgical history that includes Back surgery (11/17/95); Hernia repair (2009); Eye surgery; and Upper gastrointestinal endoscopy (x2).   His family history includes Cancer in his father, maternal grandfather, mother, and paternal grandfather.He reports that he has never smoked. He has never used smokeless tobacco. He reports that he does not drink alcohol and does not use drugs.    ROS Review of Systems  Constitutional:  Negative.   HENT: Negative.    Eyes:  Negative for visual disturbance.  Respiratory:  Negative for cough and shortness of breath.   Cardiovascular:  Negative for chest pain and leg swelling.  Gastrointestinal:  Negative for abdominal pain, diarrhea, nausea and vomiting.  Genitourinary:  Negative for difficulty urinating.  Musculoskeletal:  Negative for arthralgias and myalgias.  Skin:  Negative for rash.  Neurological:  Negative for weakness and headaches.  Psychiatric/Behavioral:  Negative for sleep disturbance.     Objective:  BP 137/87   Pulse 72   Temp (!) 97.4 F (36.3 C)   Ht 5\' 11"  (1.803 m)   Wt 237 lb (107.5 kg)   SpO2 95%   BMI 33.05 kg/m   BP Readings from Last 3 Encounters:  05/08/23 137/87  04/20/23 (!) 159/88  04/10/23 117/78    Wt Readings from Last 3 Encounters:  05/08/23 237 lb (107.5 kg)  04/20/23 227 lb (103 kg)  04/10/23 219 lb 9.6 oz (99.6 kg)     Physical Exam Vitals reviewed.  Constitutional:      Appearance: He is well-developed.  HENT:     Head: Normocephalic and atraumatic.     Right Ear: External ear normal.     Left Ear: External ear normal.     Mouth/Throat:     Pharynx: No oropharyngeal exudate or posterior  oropharyngeal erythema.  Eyes:     Pupils: Pupils are equal, round, and reactive to light.  Cardiovascular:     Rate and Rhythm: Normal rate and regular rhythm.     Heart sounds: No murmur heard. Pulmonary:     Effort: No respiratory distress.     Breath sounds: Normal breath sounds.  Musculoskeletal:        General: Deformity (gait is shufling, parkinsonian.) present.     Cervical back: Normal range of motion and neck supple.  Neurological:     General: No focal deficit present.     Mental Status: He is alert and oriented to person, place, and time.     Motor: No weakness.     Coordination: Coordination abnormal.     Gait: Gait abnormal.     Deep Tendon Reflexes: Reflexes normal.     Comments: There is rest tremor at  there right hand. Extinguishes with motion        Assessment & Plan:   Melvin "[name]" was seen today for gout, altered mental status and gate.  Diagnoses and all orders for this visit:  Parkinsonism, unspecified Parkinsonism type (HCC) -     carbidopa-levodopa (SINEMET) 10-100 MG tablet; Take 1 tablet by mouth 3 (three) times daily. For parkinsonism  Tremor of both hands -     PTH, Intact and Calcium -     Uric acid  Bipolar disorder, in partial remission, most recent episode hypomanic (HCC)  Short-term memory loss -     PTH, Intact and Calcium -     Uric acid  Primary hypertension  Cognitive attention deficit -     donepezil (ARICEPT) 5 MG tablet; Take 1 tablet (5 mg total) by mouth at bedtime.  Ataxia involving legs -     carbidopa-levodopa (SINEMET) 10-100 MG tablet; Take 1 tablet by mouth 3 (three) times daily. For parkinsonism  Acute drug-induced gout involving toe of right foot -     colchicine 0.6 MG tablet; Take twice daily for gout attack. (may take every two hours up to 6 doses at acute onset) -     allopurinol (ZYLOPRIM) 100 MG tablet; Take 1 tablet (100 mg total) by mouth daily.       I have discontinued Melvin Roberts "Melvin Roberts"'s naproxen sodium. I am also having him start on colchicine, allopurinol, carbidopa-levodopa, and donepezil. Additionally, I am having him maintain his cholecalciferol, methocarbamol, pantoprazole, fluticasone, divalproex, traZODone, OLANZapine, and ALPRAZolam.  Allergies as of 05/08/2023       Reactions   Lipitor [atorvastatin]    Niacin    Niaspan [niacin Er (antihyperlipidemic)]         Medication List        Accurate as of May 08, 2023  9:14 PM. If you have any questions, ask your nurse or doctor.          STOP taking these medications    naproxen sodium 550 MG tablet Commonly known as: Anaprox DS Stopped by: Barbara Ahart       TAKE these medications    allopurinol 100 MG tablet Commonly  known as: ZYLOPRIM Take 1 tablet (100 mg total) by mouth daily. Started by: Melvin Roberts   ALPRAZolam 0.5 MG tablet Commonly known as: XANAX Take 1 tablet (0.5 mg total) by mouth at bedtime as needed for anxiety (Take 1 tab 30 min prior to MRI and may repeat an additional dose if needed. (pt must have a driver)).   carbidopa-levodopa 10-100 MG tablet  Commonly known as: Sinemet Take 1 tablet by mouth 3 (three) times daily. For parkinsonism Started by: Melvin Roberts   cholecalciferol 1000 units tablet Commonly known as: VITAMIN D Take 1,000 Units by mouth daily.   colchicine 0.6 MG tablet Take twice daily for gout attack. (may take every two hours up to 6 doses at acute onset) Started by: Melvin Roberts   divalproex 500 MG DR tablet Commonly known as: DEPAKOTE Take 1000 mg QAM and 2000 mg QHS What changed:  how much to take how to take this when to take this additional instructions   donepezil 5 MG tablet Commonly known as: Aricept Take 1 tablet (5 mg total) by mouth at bedtime. Started by: Willia Lampert   fluticasone 50 MCG/ACT nasal spray Commonly known as: FLONASE SPRAY 2 SPRAYS INTO EACH NOSTRIL EVERY DAY   methocarbamol 500 MG tablet Commonly known as: ROBAXIN Take 1 tablet (500 mg total) by mouth 4 (four) times daily.   OLANZapine 5 MG tablet Commonly known as: ZYPREXA Take 1 tablet (5 mg total) by mouth at bedtime.   pantoprazole 40 MG tablet Commonly known as: PROTONIX Take 1 tablet (40 mg total) by mouth daily.   traZODone 100 MG tablet Commonly known as: DESYREL Take 1 tablet (100 mg total) by mouth at bedtime. What changed: additional instructions         Follow-up: Return in about 1 month (around 06/08/2023).  Mechele Claude, M.D.

## 2023-05-09 ENCOUNTER — Encounter: Payer: BC Managed Care – PPO | Admitting: Family Medicine

## 2023-05-09 ENCOUNTER — Encounter: Payer: Self-pay | Admitting: Family Medicine

## 2023-05-09 ENCOUNTER — Telehealth: Payer: Self-pay | Admitting: Neurology

## 2023-05-09 ENCOUNTER — Encounter: Payer: Self-pay | Admitting: Adult Health

## 2023-05-09 ENCOUNTER — Ambulatory Visit: Payer: BC Managed Care – PPO | Admitting: Adult Health

## 2023-05-09 DIAGNOSIS — G4700 Insomnia, unspecified: Secondary | ICD-10-CM | POA: Diagnosis not present

## 2023-05-09 DIAGNOSIS — F411 Generalized anxiety disorder: Secondary | ICD-10-CM | POA: Diagnosis not present

## 2023-05-09 DIAGNOSIS — F319 Bipolar disorder, unspecified: Secondary | ICD-10-CM

## 2023-05-09 LAB — PTH, INTACT AND CALCIUM
Calcium: 10 mg/dL (ref 8.6–10.2)
PTH: 45 pg/mL (ref 15–65)

## 2023-05-09 LAB — URIC ACID: Uric Acid: 7.7 mg/dL (ref 3.8–8.4)

## 2023-05-09 NOTE — Telephone Encounter (Signed)
Referral for neuropsychology sent through [facility] to [facility]. Phone: [phone]

## 2023-05-09 NOTE — Telephone Encounter (Signed)
Referral has been sent through [facility] to [facility]. Phone: [phone]

## 2023-05-09 NOTE — Progress Notes (Signed)
Melvin Roberts [id number] Jul 16, 1960 63 y.o.  Subjective:   Patient ID:  Melvin Roberts is a 63 y.o. (DOB 06-01-1960) male.  Chief Complaint: No chief complaint on file.   HPI Melvin Roberts presents to the office today for follow-up of Bipolar Disorder, GAD and insomnia.  Accompanied by wife.  Describes mood today as "not a lot different". Mood symptoms - reports depression, anxiety, and irritability. Reports decreased interest and motivation. Denies panic attacks. Reports worry, rumination and over thinking. Mood is variable.  Stating "I don't really like what I've been hearing".  Reports taking medications as prescribed. Wife reports he continues to struggle with memory and recall. He is being following by his PCP and Neurology. Currently taking Olanzapine, Depakote and Trazadone. Feels like current medication is helpful. Taking medications as prescribed. Energy levels lower. Active, does not have a regular exercise routine. Enjoys some usual interests and activities. Married. Lives with wife. Has 5 children and 11 grandchildren. Spending time with family. Appetite adequate. Weight 212 to 237 pounds. Reports sleep has improved. Averages 7 to 8 hours. Reports focus and concentration difficulties. Managing minimal aspects of household. 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 Denies SI or HI.  Denies AH or VH. Denies self harm. Denies substance use. Reports hearing loss.  Previous medication trials: Lithium, Seroquel   GAD-7    Flowsheet Row Office Visit from 05/08/2023 in [facility] Office Visit from 04/10/2023 in [facility] Office Visit from 12/13/2022 in [facility] Office Visit from 12/08/2022 in [facility] Office Visit from 10/31/2022 in [facility]  Total GAD-7  Score 10 10 5 5 4       Mini-Mental    Flowsheet Row Office Visit from 04/20/2023 in [facility] Office Visit from 03/22/2023 in [facility]  Total Score (max 30 points ) 25 30      PHQ2-9    Flowsheet Row Office Visit from 05/08/2023 in [facility] Office Visit from 04/10/2023 in [facility] Office Visit from 12/13/2022 in [facility] Office Visit from 12/08/2022 in [facility] Office Visit from 10/31/2022 in [facility]  PHQ-2 Total Score 2 2 2 2 1   PHQ-9 Total Score 8 9 5 6 3       Flowsheet Row ED from 02/26/2022 in [facility] Emergency Department at [facility] ED from 09/20/2020 in [facility] Department at [facility]  C-SSRS RISK CATEGORY No Risk No Risk        Review of Systems:  Review of Systems  Musculoskeletal:  Negative for gait problem.  Neurological:  Negative for tremors.  Psychiatric/Behavioral:         Please refer to HPI    Medications: I have reviewed the patient's current medications.  Current Outpatient Medications  Medication Sig Dispense Refill   allopurinol (ZYLOPRIM) 100 MG tablet Take 1 tablet (100 mg total) by mouth daily. 90 tablet 3   ALPRAZolam (XANAX) 0.5 MG tablet Take 1 tablet (0.5 mg total) by mouth at bedtime as needed for anxiety (Take 1 tab 30 min prior to MRI and may repeat an additional dose if needed. (pt must have a driver)). 5 tablet 0   carbidopa-levodopa (SINEMET) 10-100  MG tablet Take 1 tablet by mouth 3 (three) times daily. For parkinsonism 90 tablet 5   cholecalciferol (VITAMIN D) 1000 UNITS tablet Take 1,000 Units by mouth daily.     colchicine 0.6 MG tablet Take twice daily for gout attack. (may take every two hours up to 6 doses at acute onset) 60 tablet 2   divalproex  (DEPAKOTE) 500 MG DR tablet Take 1000 mg QAM and 2000 mg QHS (Patient taking differently: Take 500 mg by mouth 3 (three) times daily. Take 1000 mg QAM and 1500 mg QHS) 450 tablet 0   donepezil (ARICEPT) 5 MG tablet Take 1 tablet (5 mg total) by mouth at bedtime. 30 tablet 1   fluticasone (FLONASE) 50 MCG/ACT nasal spray SPRAY 2 SPRAYS INTO EACH NOSTRIL EVERY DAY 48 mL 1   methocarbamol (ROBAXIN) 500 MG tablet Take 1 tablet (500 mg total) by mouth 4 (four) times daily. 120 tablet 1   OLANZapine (ZYPREXA) 5 MG tablet Take 1 tablet (5 mg total) by mouth at bedtime. 30 tablet 2   pantoprazole (PROTONIX) 40 MG tablet Take 1 tablet (40 mg total) by mouth daily. 90 tablet 3   traZODone (DESYREL) 100 MG tablet Take 1 tablet (100 mg total) by mouth at bedtime. (Patient taking differently: Take 100 mg by mouth at bedtime. Take an half of pill at night.) 30 tablet 2   No current facility-administered medications for this visit.    Medication Side Effects: None  Allergies:  Allergies  Allergen Reactions   Lipitor [Atorvastatin]    Niacin    Niaspan [Niacin Er (Antihyperlipidemic)]     Past Medical History:  Diagnosis Date   Bipolar 1 disorder (HCC)    Hyperlipidemia    Hypertension     Past Medical History, Surgical history, Social history, and Family history were reviewed and updated as appropriate.   Please see review of systems for further details on the patient's review from today.   Objective:   Physical Exam:  There were no vitals taken for this visit.  Physical Exam Constitutional:      General: He is not in acute distress. Musculoskeletal:        General: No deformity.  Neurological:     Mental Status: He is alert and oriented to person, place, and time.     Coordination: Coordination normal.  Psychiatric:        Attention and Perception: Attention and perception normal. He does not perceive auditory or visual hallucinations.        Mood and Affect: Mood normal. Mood is not  anxious or depressed. Affect is not labile, blunt, angry or inappropriate.        Speech: Speech normal.        Behavior: Behavior normal.        Thought Content: Thought content normal. Thought content is not paranoid or delusional. Thought content does not include homicidal or suicidal ideation. Thought content does not include homicidal or suicidal plan.        Cognition and Memory: Cognition and memory normal.        Judgment: Judgment normal.     Comments: Insight intact     Lab Review:     Component Value Date/Time   NA 143 04/20/2023 1052   K 5.3 (H) 04/20/2023 1052   CL 105 04/20/2023 1052   CO2 22 04/20/2023 1052   GLUCOSE 94 04/20/2023 1052   GLUCOSE 117 (H) 02/26/2022 2205   BUN 25 04/20/2023 1052  CREATININE 1.36 (H) 04/20/2023 1052   CALCIUM 10.0 05/08/2023 1016   PROT 7.2 04/20/2023 1052   ALBUMIN 4.4 04/20/2023 1052   AST 66 (H) 04/20/2023 1052   ALT 52 (H) 04/20/2023 1052   ALKPHOS 59 04/20/2023 1052   BILITOT 0.2 04/20/2023 1052   GFRNONAA >60 02/26/2022 2205   GFRAA 61 05/29/2020 0854       Component Value Date/Time   WBC 6.4 04/20/2023 1052   WBC 9.1 02/26/2022 2205   RBC 4.45 04/20/2023 1052   RBC 4.00 (L) 02/26/2022 2205   HGB 13.9 04/20/2023 1052   HCT 41.2 04/20/2023 1052   PLT 171 04/20/2023 1052   MCV 93 04/20/2023 1052   MCH 31.2 04/20/2023 1052   MCH 31.0 02/26/2022 2205   MCHC 33.7 04/20/2023 1052   MCHC 33.2 02/26/2022 2205   RDW 14.6 04/20/2023 1052   LYMPHSABS 1.5 04/20/2023 1052   MONOABS 0.6 02/26/2022 2205   EOSABS 0.2 04/20/2023 1052   BASOSABS 0.0 04/20/2023 1052    Lithium Lvl  Date Value Ref Range Status  06/09/2022 1.3 (HH) 0.5 - 1.2 mmol/L Final    Comment:    A concentration of 0.5-0.8 mmol/L is advised for long-term use; concentrations of up to 1.2 mmol/L may be necessary during acute treatment.                                  Detection Limit = 0.1                           <0.1 indicates None Detected Patient  drug level exceeds published reference range.  Evaluate clinically for signs of potential toxicity.      Lab Results  Component Value Date   VALPROATE 79 04/20/2023     .res Assessment: Plan:    Plan:  PDMP reviewed  Zyprexa 5mg  at hs Trazadone decrease from 100mg  to 50mg  at hs Depakote - change from 1000mg  in the morning and 1500 at bedtime to 1000mg  BID.  RTC 4 weeks   Patient totally disabled an unable to work with current disabilities - mental health issues Bipolar disorder and recently diagnosed with Parkinson's with Lewy Body Dementia.   Discussed potential metabolic side effects associated with atypical antipsychotics, as well as potential risk for movement side effects. Advised pt to contact office if movement side effects occur.    Patient advised to contact office with any questions, adverse effects, or acute worsening in signs and symptoms.  Diagnoses and all orders for this visit:  Bipolar I disorder (HCC)  Generalized anxiety disorder  Insomnia, unspecified type     Please see After Visit Summary for patient specific instructions.  Future Appointments  Date Time Provider Department Center  06/02/2023  1:20 PM Olive Bass, FNP [facility]  06/06/2023  3:30 PM Aleese Kamps, Thereasa Solo, NP CP-CP None  06/08/2023 11:25 AM Mechele Claude, MD [facility] None  10/23/2023 10:30 AM Dohmeier, Porfirio Mylar, MD [facility] None    No orders of the defined types were placed in this encounter.   -------------------------------

## 2023-05-10 ENCOUNTER — Encounter: Payer: Self-pay | Admitting: Neurology

## 2023-05-10 ENCOUNTER — Telehealth: Payer: Self-pay | Admitting: Adult Health

## 2023-05-10 NOTE — Telephone Encounter (Signed)
Spoke with wife, Melvin Roberts, she said that he just started the parkinson medication, Levodopa, yesterday. She's not sure how long it takes to get into his system. She said that he went to [place] today and got lost several times. His nurse mentioned that Mariea Stable could be making his memory issues worse. She's calling to see if it could be affecting his memory.   Please advise.

## 2023-05-10 NOTE — Telephone Encounter (Signed)
Wife, Shawna Orleans, called and LM at 11:14 stating the Melvin Roberts is not doing well.  Seems to be getting more confused every day.  She is concerned his Olanzapine or other medication is making it worse  She would like to discuss his medications.  Please call.  Next apt 2/18.  There is DPR on file to speak with her.

## 2023-05-11 ENCOUNTER — Encounter: Payer: Self-pay | Admitting: Psychology

## 2023-05-23 ENCOUNTER — Other Ambulatory Visit: Payer: Self-pay | Admitting: Adult Health

## 2023-05-23 DIAGNOSIS — F319 Bipolar disorder, unspecified: Secondary | ICD-10-CM

## 2023-05-23 DIAGNOSIS — G4700 Insomnia, unspecified: Secondary | ICD-10-CM

## 2023-05-29 ENCOUNTER — Encounter: Payer: Self-pay | Admitting: Family Medicine

## 2023-05-30 ENCOUNTER — Other Ambulatory Visit: Payer: Self-pay | Admitting: Family Medicine

## 2023-05-30 DIAGNOSIS — R41840 Attention and concentration deficit: Secondary | ICD-10-CM

## 2023-05-30 NOTE — Telephone Encounter (Signed)
Please arrange office followup.

## 2023-05-31 ENCOUNTER — Encounter: Payer: Self-pay | Admitting: Internal Medicine

## 2023-06-02 ENCOUNTER — Ambulatory Visit: Payer: BC Managed Care – PPO | Admitting: Family

## 2023-06-02 ENCOUNTER — Ambulatory Visit: Payer: BC Managed Care – PPO | Admitting: Nurse Practitioner

## 2023-06-03 ENCOUNTER — Other Ambulatory Visit: Payer: Self-pay | Admitting: Adult Health

## 2023-06-03 DIAGNOSIS — F319 Bipolar disorder, unspecified: Secondary | ICD-10-CM

## 2023-06-06 ENCOUNTER — Ambulatory Visit: Payer: BC Managed Care – PPO | Admitting: Adult Health

## 2023-06-06 ENCOUNTER — Encounter: Payer: Self-pay | Admitting: Adult Health

## 2023-06-06 DIAGNOSIS — F411 Generalized anxiety disorder: Secondary | ICD-10-CM | POA: Diagnosis not present

## 2023-06-06 DIAGNOSIS — F319 Bipolar disorder, unspecified: Secondary | ICD-10-CM

## 2023-06-06 DIAGNOSIS — G4700 Insomnia, unspecified: Secondary | ICD-10-CM

## 2023-06-06 MED ORDER — DIVALPROEX SODIUM 500 MG PO DR TAB: DELAYED_RELEASE_TABLET | ORAL | 1 refills | Status: AC

## 2023-06-06 MED ORDER — OLANZAPINE 5 MG PO TABS: 5.0000 mg | ORAL_TABLET | Freq: Every day | ORAL | 3 refills | Status: DC

## 2023-06-06 NOTE — Progress Notes (Signed)
Melvin Roberts [id number] October 15, 1960 62 y.o.  Subjective:   Patient ID:  Melvin Roberts is a 63 y.o. (DOB 11/22/1960) male.  Chief Complaint: No chief complaint on file.   HPI Melvin Roberts presents to the office today for follow-up of Bipolar Disorder, GAD and insomnia.  Accompanied by wife.  Describes mood today as "not a lot different". Mood symptoms - denies depression, anxiety, and irritability. Reports improved interest and motivation. Denies panic attacks. Reports some worry, rumination and over thinking. Mood is more consistent. Stating "I feel like I'm doing better". Reports taking medications as prescribed. Wife reports he is doing better. He is being followed by his PCP and Neurology. Currently taking Olanzapine and Depakote. Taking medications as prescribed. Energy levels improved. Active, does not have a regular exercise routine. Enjoys some usual interests and activities. Married. Lives with wife. Has 5 children and 11 grandchildren. Spending time with family. Appetite adequate. Weight 225 pounds. Reports sleep has improved. Averages 7 to 8 hours. Reports focus and concentration difficulties. Managing minimal aspects of household. Disabled. Denies SI or HI.  Denies AH or VH. Denies self harm. Denies substance use. Reports hearing loss.  Previous medication trials: Lithium, Seroquel   GAD-7    Flowsheet Row Office Visit from 05/08/2023 in [facility] Office Visit from 04/10/2023 in [facility] Office Visit from 12/13/2022 in [facility] Office Visit from 12/08/2022 in [facility] Office Visit from 10/31/2022 in [facility]  Total GAD-7 Score 10 10 5 5 4       Mini-Mental    Flowsheet Row Office Visit from 04/20/2023 in [facility] Office Visit from  03/22/2023 in [facility]  Total Score (max 30 points ) 25 30      PHQ2-9    Flowsheet Row Office Visit from 05/08/2023 in [facility] Office Visit from 04/10/2023 in [facility] Office Visit from 12/13/2022 in [facility] Office Visit from 12/08/2022 in [facility] Office Visit from 10/31/2022 in [facility]  PHQ-2 Total Score 2 2 2 2 1   PHQ-9 Total Score 8 9 5 6 3       Flowsheet Row ED from 02/26/2022 in [facility] Emergency Department at [facility] ED from 09/20/2020 in [facility] Emergency Department at [facility]  C-SSRS RISK CATEGORY No Risk No Risk        Review of Systems:  Review of Systems  Musculoskeletal:  Negative for gait problem.  Neurological:  Negative for tremors.  Psychiatric/Behavioral:         Please refer to HPI    Medications: I have reviewed the patient's current medications.  Current Outpatient Medications  Medication Sig Dispense Refill   allopurinol (ZYLOPRIM) 100 MG tablet Take 1 tablet (100 mg total) by mouth daily. 90 tablet 3   ALPRAZolam (XANAX) 0.5 MG tablet Take 1 tablet (0.5 mg total) by mouth at bedtime as needed for anxiety (Take 1 tab 30 min prior to MRI and may repeat an additional dose if needed. (pt must have a driver)). 5 tablet 0   carbidopa-levodopa (SINEMET) 10-100 MG tablet Take 1 tablet by mouth 3 (three) times daily. For parkinsonism 90 tablet 5   cholecalciferol (VITAMIN D) 1000 UNITS tablet Take 1,000 Units  by mouth daily.     colchicine 0.6 MG tablet Take twice daily for gout attack. (may take every two hours up to 6 doses at acute onset) 60 tablet 2   divalproex (DEPAKOTE) 500 MG DR tablet Take 1000 mg QAM and 2000 mg QHS (Patient taking differently: Take 500 mg by mouth 3 (three) times daily. Take 1000 mg QAM and  1500 mg QHS) 450 tablet 0   donepezil (ARICEPT) 5 MG tablet TAKE 1 TABLET BY MOUTH EVERYDAY AT BEDTIME 90 tablet 0   fluticasone (FLONASE) 50 MCG/ACT nasal spray SPRAY 2 SPRAYS INTO EACH NOSTRIL EVERY DAY 48 mL 1   methocarbamol (ROBAXIN) 500 MG tablet Take 1 tablet (500 mg total) by mouth 4 (four) times daily. 120 tablet 1   OLANZapine (ZYPREXA) 5 MG tablet TAKE 1 TABLET BY MOUTH EVERYDAY AT BEDTIME 30 tablet 0   pantoprazole (PROTONIX) 40 MG tablet Take 1 tablet (40 mg total) by mouth daily. 90 tablet 3   traZODone (DESYREL) 100 MG tablet Take 0.5 tablets (50 mg total) by mouth at bedtime. 15 tablet 0   No current facility-administered medications for this visit.    Medication Side Effects: None  Allergies:  Allergies  Allergen Reactions   Lipitor [Atorvastatin]    Niacin    Niaspan [Niacin Er (Antihyperlipidemic)]     Past Medical History:  Diagnosis Date   Bipolar 1 disorder (HCC)    Hyperlipidemia    Hypertension     Past Medical History, Surgical history, Social history, and Family history were reviewed and updated as appropriate.   Please see review of systems for further details on the patient's review from today.   Objective:   Physical Exam:  There were no vitals taken for this visit.  Physical Exam Constitutional:      General: He is not in acute distress. Musculoskeletal:        General: No deformity.  Neurological:     Mental Status: He is alert and oriented to person, place, and time.     Coordination: Coordination normal.  Psychiatric:        Attention and Perception: Attention and perception normal. He does not perceive auditory or visual hallucinations.        Mood and Affect: Mood normal. Mood is not anxious or depressed. Affect is not labile, blunt, angry or inappropriate.        Speech: Speech normal.        Behavior: Behavior normal.        Thought Content: Thought content normal. Thought content is not paranoid or delusional. Thought content  does not include homicidal or suicidal ideation. Thought content does not include homicidal or suicidal plan.        Cognition and Memory: Cognition and memory normal.        Judgment: Judgment normal.     Comments: Insight intact     Lab Review:     Component Value Date/Time   NA 143 04/20/2023 1052   K 5.3 (H) 04/20/2023 1052   CL 105 04/20/2023 1052   CO2 22 04/20/2023 1052   GLUCOSE 94 04/20/2023 1052   GLUCOSE 117 (H) 02/26/2022 2205   BUN 25 04/20/2023 1052   CREATININE 1.36 (H) 04/20/2023 1052   CALCIUM 10.0 05/08/2023 1016   PROT 7.2 04/20/2023 1052   ALBUMIN 4.4 04/20/2023 1052   AST 66 (H) 04/20/2023 1052   ALT 52 (H) 04/20/2023 1052   ALKPHOS 59 04/20/2023 1052   BILITOT 0.2 04/20/2023  1052   GFRNONAA >60 02/26/2022 2205   GFRAA 61 05/29/2020 0854       Component Value Date/Time   WBC 6.4 04/20/2023 1052   WBC 9.1 02/26/2022 2205   RBC 4.45 04/20/2023 1052   RBC 4.00 (L) 02/26/2022 2205   HGB 13.9 04/20/2023 1052   HCT 41.2 04/20/2023 1052   PLT 171 04/20/2023 1052   MCV 93 04/20/2023 1052   MCH 31.2 04/20/2023 1052   MCH 31.0 02/26/2022 2205   MCHC 33.7 04/20/2023 1052   MCHC 33.2 02/26/2022 2205   RDW 14.6 04/20/2023 1052   LYMPHSABS 1.5 04/20/2023 1052   MONOABS 0.6 02/26/2022 2205   EOSABS 0.2 04/20/2023 1052   BASOSABS 0.0 04/20/2023 1052    Lithium Lvl  Date Value Ref Range Status  06/09/2022 1.3 (HH) 0.5 - 1.2 mmol/L Final    Comment:    A concentration of 0.5-0.8 mmol/L is advised for long-term use; concentrations of up to 1.2 mmol/L may be necessary during acute treatment.                                  Detection Limit = 0.1                           <0.1 indicates None Detected Patient drug level exceeds published reference range.  Evaluate clinically for signs of potential toxicity.      Lab Results  Component Value Date   VALPROATE 79 04/20/2023     .res Assessment: Plan:    Plan:  PDMP reviewed  Zyprexa 5mg  at  hs. Depakote 1000mg  BID.  D/C Trazadone to 50mg  at hs  RTC 4 weeks   Patient totally disabled an unable to work with current disabilities - mental health issues Bipolar disorder and recently diagnosed with Parkinson's with Lewy Body Dementia.   Discussed potential metabolic side effects associated with atypical antipsychotics, as well as potential risk for movement side effects. Advised pt to contact office if movement side effects occur.    Patient advised to contact office with any questions, adverse effects, or acute worsening in signs and symptoms.  There are no diagnoses linked to this encounter.   Please see After Visit Summary for patient specific instructions.  Future Appointments  Date Time Provider Department Center  06/08/2023 11:25 AM Mechele Claude, MD [facility] None  08/10/2023  3:00 PM Hershal Coria, PsyD [facility] CPR  10/23/2023 10:30 AM Dohmeier, Porfirio Mylar, MD [facility] None    No orders of the defined types were placed in this encounter.   -------------------------------

## 2023-06-07 ENCOUNTER — Encounter: Payer: Self-pay | Admitting: Internal Medicine

## 2023-06-08 ENCOUNTER — Ambulatory Visit: Payer: 59 | Admitting: Family Medicine

## 2023-06-08 ENCOUNTER — Encounter: Payer: Self-pay | Admitting: Family Medicine

## 2023-06-08 VITALS — BP 125/89 | Temp 97.5°F | Ht 71.0 in | Wt 236.0 lb

## 2023-06-08 DIAGNOSIS — R260 Ataxic gait: Secondary | ICD-10-CM | POA: Diagnosis not present

## 2023-06-08 DIAGNOSIS — M10271 Drug-induced gout, right ankle and foot: Secondary | ICD-10-CM | POA: Diagnosis not present

## 2023-06-08 DIAGNOSIS — G20C Parkinsonism, unspecified: Secondary | ICD-10-CM | POA: Diagnosis not present

## 2023-06-08 DIAGNOSIS — R251 Tremor, unspecified: Secondary | ICD-10-CM

## 2023-06-08 MED ORDER — ALLOPURINOL 300 MG PO TABS: 300.0000 mg | ORAL_TABLET | Freq: Every day | ORAL | 1 refills | Status: DC

## 2023-06-08 MED ORDER — CARBIDOPA-LEVODOPA 10-100 MG PO TABS: 2.0000 | ORAL_TABLET | Freq: Three times a day (TID) | ORAL | 3 refills | Status: DC

## 2023-06-08 NOTE — Progress Notes (Signed)
 Subjective:  Patient ID: Melvin Roberts, male    DOB: [birth year]  Age: 63 y.o. MRN: [id number]  CC: Spasms (Muscle spasm in hands and legs. Worse at night. Happening daily. ), Gout (Right foot big toe. Gout flare up. Unable to walk sometimes. Taken meds as proscribed. ), and Ankle Pain (Ankle pain off and on. Especially when gout is flared up. )   HPI Melvin Roberts presents for Much improved gait and stability. Still some spasm in legs at night, moderate. Awakens him at times has to rub it down. Occurs greater than 50% of nights. The falling forward sensation is gone. Not falling.      06/08/2023   11:42 AM 05/08/2023    9:41 AM 04/10/2023    8:21 AM  Depression screen PHQ 2/9  Decreased Interest 0 1 1  Down, Depressed, Hopeless 0 1 1  PHQ - 2 Score 0 2 2  Altered sleeping 1 1 0  Tired, decreased energy 1 1 2   Change in appetite 0 0 1  Feeling bad or failure about yourself  0 1 1  Trouble concentrating 2 1 2   Moving slowly or fidgety/restless 1 1 1   Suicidal thoughts 0 1 0  PHQ-9 Score 5 8 9   Difficult doing work/chores Not difficult at all Somewhat difficult Somewhat difficult    History Melvin Roberts has a past medical history of Bipolar 1 disorder (HCC), Hyperlipidemia, and Hypertension.   Melvin Roberts has a past surgical history that includes Back surgery (11/17/95); Hernia repair (2009); Eye surgery; and Upper gastrointestinal endoscopy (x2).   His family history includes Cancer in his father, maternal grandfather, mother, and paternal grandfather.Melvin Roberts reports that Melvin Roberts has never smoked. Melvin Roberts has never used smokeless tobacco. Melvin Roberts reports that Melvin Roberts does not drink alcohol and does not use drugs.    ROS Review of Systems  Constitutional:  Negative for fever.  Respiratory:  Negative for shortness of breath.   Cardiovascular:  Negative for chest pain.  Musculoskeletal:  Negative for arthralgias.  Skin:  Negative for rash.    Objective:  BP 125/89   Temp (!) 97.5 F (36.4 C)   Ht  5\' 11"  (1.803 m)   Wt 236 lb (107 kg)   SpO2 95%   BMI 32.92 kg/m   BP Readings from Last 3 Encounters:  06/08/23 125/89  05/08/23 137/87  04/20/23 (!) 159/88    Wt Readings from Last 3 Encounters:  06/08/23 236 lb (107 kg)  05/08/23 237 lb (107.5 kg)  04/20/23 227 lb (103 kg)     Physical Exam Vitals reviewed.  Constitutional:      Appearance: Melvin Roberts is well-developed.  HENT:     Head: Normocephalic and atraumatic.     Right Ear: External ear normal.     Left Ear: External ear normal.     Mouth/Throat:     Pharynx: No oropharyngeal exudate or posterior oropharyngeal erythema.  Eyes:     Pupils: Pupils are equal, round, and reactive to light.  Cardiovascular:     Rate and Rhythm: Normal rate and regular rhythm.     Heart sounds: No murmur heard. Pulmonary:     Effort: No respiratory distress.     Breath sounds: Normal breath sounds.  Musculoskeletal:     Cervical back: Normal range of motion and neck supple.  Neurological:     Mental Status: Melvin Roberts is alert and oriented to person, place, and time.       Assessment & Plan:   Melvin Roberts "  [name]" was seen today for spasms, gout and ankle pain.  Diagnoses and all orders for this visit:  Ataxia involving legs -     carbidopa-levodopa (SINEMET) 10-100 MG tablet; Take 2 tablets by mouth 3 (three) times daily. For parkinsonism  Tremor of both hands  Parkinsonism, unspecified Parkinsonism type (HCC) -     carbidopa-levodopa (SINEMET) 10-100 MG tablet; Take 2 tablets by mouth 3 (three) times daily. For parkinsonism  Acute drug-induced gout involving toe of right foot -     allopurinol (ZYLOPRIM) 300 MG tablet; Take 1 tablet (300 mg total) by mouth daily.       I have discontinued [name] "[name]"'s ALPRAZolam. I have also changed his carbidopa-levodopa and allopurinol. Additionally, I am having him maintain his cholecalciferol, methocarbamol, pantoprazole, fluticasone, colchicine, donepezil, OLANZapine, and  divalproex.  Allergies as of 06/08/2023       Reactions   Lipitor [atorvastatin]    Niacin    Niaspan [niacin Er (antihyperlipidemic)]         Medication List        Accurate as of June 08, 2023 11:59 PM. If you have any questions, ask your nurse or doctor.          STOP taking these medications    ALPRAZolam 0.5 MG tablet Commonly known as: XANAX Stopped by: Emberly Tomasso       TAKE these medications    allopurinol 300 MG tablet Commonly known as: ZYLOPRIM Take 1 tablet (300 mg total) by mouth daily. What changed:  medication strength how much to take Changed by: Curstin Schmale   carbidopa-levodopa 10-100 MG tablet Commonly known as: Sinemet Take 2 tablets by mouth 3 (three) times daily. For parkinsonism What changed: how much to take Changed by: Broadus John Averi Cacioppo   cholecalciferol 1000 units tablet Commonly known as: VITAMIN D Take 1,000 Units by mouth daily.   colchicine 0.6 MG tablet Take twice daily for gout attack. (may take every two hours up to 6 doses at acute onset)   divalproex 500 MG DR tablet Commonly known as: DEPAKOTE Take two tablets in the morning and take two tablets at bedtime.   donepezil 5 MG tablet Commonly known as: ARICEPT TAKE 1 TABLET BY MOUTH EVERYDAY AT BEDTIME   fluticasone 50 MCG/ACT nasal spray Commonly known as: FLONASE SPRAY 2 SPRAYS INTO EACH NOSTRIL EVERY DAY   methocarbamol 500 MG tablet Commonly known as: ROBAXIN Take 1 tablet (500 mg total) by mouth 4 (four) times daily.   OLANZapine 5 MG tablet Commonly known as: ZYPREXA Take 1 tablet (5 mg total) by mouth at bedtime.   pantoprazole 40 MG tablet Commonly known as: PROTONIX Take 1 tablet (40 mg total) by mouth daily.         Follow-up: Return in about 3 months (around 09/05/2023) for Parkinsonism.  Mechele Claude, M.D.

## 2023-06-20 ENCOUNTER — Encounter: Payer: Self-pay | Admitting: Family Medicine

## 2023-06-21 ENCOUNTER — Ambulatory Visit: Payer: BC Managed Care – PPO | Admitting: Nurse Practitioner

## 2023-06-30 ENCOUNTER — Encounter: Payer: Self-pay | Admitting: Adult Health

## 2023-06-30 ENCOUNTER — Telehealth: Payer: BC Managed Care – PPO | Admitting: Adult Health

## 2023-06-30 DIAGNOSIS — F319 Bipolar disorder, unspecified: Secondary | ICD-10-CM

## 2023-06-30 DIAGNOSIS — F411 Generalized anxiety disorder: Secondary | ICD-10-CM

## 2023-06-30 DIAGNOSIS — G4700 Insomnia, unspecified: Secondary | ICD-10-CM

## 2023-06-30 NOTE — Progress Notes (Signed)
 Melvin Roberts [id number] Apr 27, 1960 63 y.o.  Virtual Visit via Video Note  I connected with pt @ on 06/30/23 at 10:30 AM EDT by a video enabled telemedicine application and verified that I am speaking with the correct person using two identifiers.   I discussed the limitations of evaluation and management by telemedicine and the availability of in person appointments. The patient expressed understanding and agreed to proceed.  I discussed the assessment and treatment plan with the patient. The patient was provided an opportunity to ask questions and all were answered. The patient agreed with the plan and demonstrated an understanding of the instructions.   The patient was advised to call back or seek an in-person evaluation if the symptoms worsen or if the condition fails to improve as anticipated.  I provided 25 minutes of non-face-to-face time during this encounter.  The patient was located at home.  The provider was located at [facility].   Dorothyann Gibbs, NP   Subjective:   Patient ID:  Melvin Roberts is a 63 y.o. (DOB 1960-09-08) male.  Chief Complaint: No chief complaint on file.   HPI Melvin Roberts presents for follow-up of Bipolar Disorder, GAD and insomnia.  Accompanied by wife.  Describes mood today as "ok". Mood symptoms - denies depression and anxiety. Reports irritability - more situational. Reports improved interest and motivation - "more motivated since starting prednisone". Denies panic attacks. Reports some over thinking. Denies worry and rumination. Reports some health issues - gout - taking prednisone. Reports mood is stable. Stating "I feel like I'm doing pretty good". Currently taking Olanzapine and Depakote. Taking medications as prescribed. Energy levels improved. Active, does not have a regular exercise routine. Enjoys some usual interests and activities. Married. Lives with wife. Has 5 children and 11 grandchildren. Spending  time with family. Appetite adequate. Weight 235 pounds. Reports sleep has improved. Averages 6 to 7 hours - less with taking Prednisone. Reports focus and concentration difficulties. Managing minimal aspects of household. Disabled. Denies SI or HI.  Denies AH or VH. Denies self harm. Denies substance use. Reports hearing loss.  Previous medication trials: Lithium, Seroquel  Review of Systems:  Review of Systems  Musculoskeletal:  Negative for gait problem.  Neurological:  Negative for tremors.  Psychiatric/Behavioral:         Please refer to HPI    Medications: I have reviewed the patient's current medications.  Current Outpatient Medications  Medication Sig Dispense Refill   allopurinol (ZYLOPRIM) 300 MG tablet Take 1 tablet (300 mg total) by mouth daily. 90 tablet 1   carbidopa-levodopa (SINEMET) 10-100 MG tablet Take 2 tablets by mouth 3 (three) times daily. For parkinsonism 540 tablet 3   cholecalciferol (VITAMIN D) 1000 UNITS tablet Take 1,000 Units by mouth daily.     colchicine 0.6 MG tablet Take twice daily for gout attack. (may take every two hours up to 6 doses at acute onset) 60 tablet 2   divalproex (DEPAKOTE) 500 MG DR tablet Take two tablets in the morning and take two tablets at bedtime. 360 tablet 1   donepezil (ARICEPT) 5 MG tablet TAKE 1 TABLET BY MOUTH EVERYDAY AT BEDTIME 90 tablet 0   methocarbamol (ROBAXIN) 500 MG tablet Take 1 tablet (500 mg total) by mouth 4 (four) times daily. 120 tablet 1   OLANZapine (ZYPREXA) 5 MG tablet Take 1 tablet (5 mg total) by mouth at bedtime. 90 tablet 3   pantoprazole (PROTONIX) 40 MG tablet Take 1 tablet (40  mg total) by mouth daily. 90 tablet 3   No current facility-administered medications for this visit.    Medication Side Effects: None  Allergies:  Allergies  Allergen Reactions   Lipitor [Atorvastatin]    Niacin    Niaspan [Niacin Er (Antihyperlipidemic)]     Past Medical History:  Diagnosis Date   Bipolar 1  disorder (HCC)    Hyperlipidemia    Hypertension     Family History  Problem Relation Age of Onset   Cancer Mother        breast   Cancer Father        lug   Cancer Maternal Grandfather    Cancer Paternal Grandfather    Colon cancer Neg Hx     Social History   Socioeconomic History   Marital status: Married    Spouse name: Not on file   Number of children: Not on file   Years of education: Not on file   Highest education level: Associate degree: occupational, Scientist, product/process development, or vocational program  Occupational History   Not on file  Tobacco Use   Smoking status: Never   Smokeless tobacco: Never  Vaping Use   Vaping status: Never Used  Substance and Sexual Activity   Alcohol use: No   Drug use: No   Sexual activity: Not on file  Other Topics Concern   Not on file  Social History Narrative   Not on file   Social Drivers of Health   Financial Resource Strain: Low Risk  (06/07/2023)   Overall Financial Resource Strain (CARDIA)    Difficulty of Paying Living Expenses: Not hard at all  Food Insecurity: No Food Insecurity (06/07/2023)   Hunger Vital Sign    Worried About Running Out of Food in the Last Year: Never true    Ran Out of Food in the Last Year: Never true  Transportation Needs: No Transportation Needs (06/07/2023)   PRAPARE - Administrator, Civil Service (Medical): No    Lack of Transportation (Non-Medical): No  Physical Activity: Sufficiently Active (06/07/2023)   Exercise Vital Sign    Days of Exercise per Week: 3 days    Minutes of Exercise per Session: 50 min  Stress: No Stress Concern Present (06/07/2023)   [employer] of Occupational Health - Occupational Stress Questionnaire    Feeling of Stress : Only a little  Social Connections: Socially Integrated (06/07/2023)   Social Connection and Isolation Panel [NHANES]    Frequency of Communication with Friends and Family: Twice a week    Frequency of Social Gatherings with Friends and Family:  Once a week    Attends Religious Services: 1 to 4 times per year    Active Member of [employer] or Organizations: Yes    Attends Banker Meetings: 1 to 4 times per year    Marital Status: Married  Catering manager Violence: Not on file    Past Medical History, Surgical history, Social history, and Family history were reviewed and updated as appropriate.   Please see review of systems for further details on the patient's review from today.   Objective:   Physical Exam:  There were no vitals taken for this visit.  Physical Exam Constitutional:      General: He is not in acute distress. Musculoskeletal:        General: No deformity.  Neurological:     Mental Status: He is alert and oriented to person, place, and time.     Coordination: Coordination  normal.  Psychiatric:        Attention and Perception: Attention and perception normal. He does not perceive auditory or visual hallucinations.        Mood and Affect: Affect is not labile, blunt, angry or inappropriate.        Speech: Speech normal.        Behavior: Behavior normal.        Thought Content: Thought content normal. Thought content is not paranoid or delusional. Thought content does not include homicidal or suicidal ideation. Thought content does not include homicidal or suicidal plan.        Cognition and Memory: Cognition and memory normal.        Judgment: Judgment normal.     Comments: Insight intact     Lab Review:     Component Value Date/Time   NA 143 04/20/2023 1052   K 5.3 (H) 04/20/2023 1052   CL 105 04/20/2023 1052   CO2 22 04/20/2023 1052   GLUCOSE 94 04/20/2023 1052   GLUCOSE 117 (H) 02/26/2022 2205   BUN 25 04/20/2023 1052   CREATININE 1.36 (H) 04/20/2023 1052   CALCIUM 10.0 05/08/2023 1016   PROT 7.2 04/20/2023 1052   ALBUMIN 4.4 04/20/2023 1052   AST 66 (H) 04/20/2023 1052   ALT 52 (H) 04/20/2023 1052   ALKPHOS 59 04/20/2023 1052   BILITOT 0.2 04/20/2023 1052   GFRNONAA >60  02/26/2022 2205   GFRAA 61 05/29/2020 0854       Component Value Date/Time   WBC 6.4 04/20/2023 1052   WBC 9.1 02/26/2022 2205   RBC 4.45 04/20/2023 1052   RBC 4.00 (L) 02/26/2022 2205   HGB 13.9 04/20/2023 1052   HCT 41.2 04/20/2023 1052   PLT 171 04/20/2023 1052   MCV 93 04/20/2023 1052   MCH 31.2 04/20/2023 1052   MCH 31.0 02/26/2022 2205   MCHC 33.7 04/20/2023 1052   MCHC 33.2 02/26/2022 2205   RDW 14.6 04/20/2023 1052   LYMPHSABS 1.5 04/20/2023 1052   MONOABS 0.6 02/26/2022 2205   EOSABS 0.2 04/20/2023 1052   BASOSABS 0.0 04/20/2023 1052    Lithium Lvl  Date Value Ref Range Status  06/09/2022 1.3 (HH) 0.5 - 1.2 mmol/L Final    Comment:    A concentration of 0.5-0.8 mmol/L is advised for long-term use; concentrations of up to 1.2 mmol/L may be necessary during acute treatment.                                  Detection Limit = 0.1                           <0.1 indicates None Detected Patient drug level exceeds published reference range.  Evaluate clinically for signs of potential toxicity.      Lab Results  Component Value Date   VALPROATE 79 04/20/2023     .res Assessment: Plan:    Plan:  PDMP reviewed  Zyprexa 5mg  at hs. Depakote 1000mg  BID.  RTC 4 weeks   15 minutes spent dedicated to the care of this patient on the date of this encounter to include pre-visit review of records, ordering of medication, post visit documentation, and face-to-face time with the patient discussing Bipolar Disorder, GAD and insomnia. Discussed continuing current medication regimen.  Patient totally disabled an unable to work with current disabilities - mental health issues Bipolar  disorder and recently diagnosed with Parkinson's with Lewy Body Dementia.   Discussed potential metabolic side effects associated with atypical antipsychotics, as well as potential risk for movement side effects. Advised pt to contact office if movement side effects occur.    Patient advised  to contact office with any questions, adverse effects, or acute worsening in signs and symptoms.  Diagnoses and all orders for this visit:  Bipolar I disorder (HCC)  Generalized anxiety disorder  Insomnia, unspecified type     Please see After Visit Summary for patient specific instructions.  Future Appointments  Date Time Provider Department Center  07/04/2023 11:00 AM [facility] PREVISIT RM 50 [facility]  07/18/2023  8:30 AM Iva Boop, MD [facility]  08/10/2023  3:00 PM Hershal Coria, PsyD [facility]  08/21/2023 10:25 AM Mechele Claude, MD [facility] None  10/23/2023 10:30 AM Dohmeier, Porfirio Mylar, MD [facility] None    No orders of the defined types were placed in this encounter.     -------------------------------

## 2023-07-04 ENCOUNTER — Other Ambulatory Visit: Payer: Self-pay

## 2023-07-04 ENCOUNTER — Ambulatory Visit (AMBULATORY_SURGERY_CENTER): Payer: BC Managed Care – PPO

## 2023-07-04 VITALS — Ht 71.0 in | Wt 240.0 lb

## 2023-07-04 DIAGNOSIS — Z1211 Encounter for screening for malignant neoplasm of colon: Secondary | ICD-10-CM

## 2023-07-04 MED ORDER — SUFLAVE 178.7 G PO SOLR: 1.0000 | Freq: Once | ORAL | 0 refills | Status: AC

## 2023-07-04 MED ORDER — SUFLAVE 178.7 G PO SOLR: 1.0000 | Freq: Once | ORAL | 0 refills | Status: DC

## 2023-07-04 NOTE — Progress Notes (Signed)

## 2023-07-13 ENCOUNTER — Encounter: Payer: Self-pay | Admitting: Internal Medicine

## 2023-07-17 NOTE — Progress Notes (Unsigned)
 [facility] History and Physical   Primary Care Physician:  Mechele Claude, MD   Reason for Procedure:   CRCA screening  Plan:    colonoscopy     HPI: Melvin Roberts is a 63 y.o. male here for a repeat screening exam. 2015 colonoscopy was normal.   Past Medical History:  Diagnosis Date   Allergy    Bipolar 1 disorder (HCC)    GERD (gastroesophageal reflux disease)    Hyperlipidemia    Hypertension     Past Surgical History:  Procedure Laterality Date   BACK SURGERY  11/17/95   Dr. Shelle Iron   EYE SURGERY     HERNIA REPAIR  2009   umbilical   UPPER GASTROINTESTINAL ENDOSCOPY  x2   w/ esophageal dilation    Prior to Admission medications   Medication Sig Start Date End Date Taking? Authorizing Provider  allopurinol (ZYLOPRIM) 300 MG tablet Take 1 tablet (300 mg total) by mouth daily. 06/08/23   Mechele Claude, MD  carbidopa-levodopa (SINEMET) 10-100 MG tablet Take 2 tablets by mouth 3 (three) times daily. For parkinsonism 06/08/23   Mechele Claude, MD  cholecalciferol (VITAMIN D) 1000 UNITS tablet Take 1,000 Units by mouth daily.    [provider]  colchicine 0.6 MG tablet Take twice daily for gout attack. (may take every two hours up to 6 doses at acute onset) 05/08/23   Mechele Claude, MD  divalproex (DEPAKOTE) 500 MG DR tablet Take two tablets in the morning and take two tablets at bedtime. 06/06/23   Mozingo, Thereasa Solo, NP  donepezil (ARICEPT) 5 MG tablet TAKE 1 TABLET BY MOUTH EVERYDAY AT BEDTIME 05/30/23   Mechele Claude, MD  methocarbamol (ROBAXIN) 500 MG tablet Take 1 tablet (500 mg total) by mouth 4 (four) times daily. 10/31/22   Mechele Claude, MD  OLANZapine (ZYPREXA) 5 MG tablet Take 1 tablet (5 mg total) by mouth at bedtime. 06/06/23   Mozingo, Thereasa Solo, NP  pantoprazole (PROTONIX) 40 MG tablet Take 1 tablet (40 mg total) by mouth daily. 10/31/22   Mechele Claude, MD    Current Outpatient Medications  Medication Sig Dispense  Refill   allopurinol (ZYLOPRIM) 300 MG tablet Take 1 tablet (300 mg total) by mouth daily. 90 tablet 1   carbidopa-levodopa (SINEMET) 10-100 MG tablet Take 2 tablets by mouth 3 (three) times daily. For parkinsonism 540 tablet 3   cholecalciferol (VITAMIN D) 1000 UNITS tablet Take 1,000 Units by mouth daily.     colchicine 0.6 MG tablet Take twice daily for gout attack. (may take every two hours up to 6 doses at acute onset) 60 tablet 2   divalproex (DEPAKOTE) 500 MG DR tablet Take two tablets in the morning and take two tablets at bedtime. 360 tablet 1   donepezil (ARICEPT) 5 MG tablet TAKE 1 TABLET BY MOUTH EVERYDAY AT BEDTIME 90 tablet 0   methocarbamol (ROBAXIN) 500 MG tablet Take 1 tablet (500 mg total) by mouth 4 (four) times daily. 120 tablet 1   OLANZapine (ZYPREXA) 5 MG tablet Take 1 tablet (5 mg total) by mouth at bedtime. 90 tablet 3   pantoprazole (PROTONIX) 40 MG tablet Take 1 tablet (40 mg total) by mouth daily. 90 tablet 3   No current facility-administered medications for this visit.    Allergies as of 07/18/2023 - Review Complete 07/04/2023  Allergen Reaction Noted   Lipitor [atorvastatin]  03/11/2013   Niacin  03/11/2013    Family History  Problem Relation Age of Onset  Cancer Mother        breast   Cancer Father        lug   Esophageal cancer Maternal Grandfather    Cancer Maternal Grandfather    Cancer Paternal Grandfather    Colon cancer Neg Hx    Colon polyps Neg Hx    Rectal cancer Neg Hx    Stomach cancer Neg Hx     Social History   Socioeconomic History   Marital status: Married    Spouse name: Not on file   Number of children: Not on file   Years of education: Not on file   Highest education level: Associate degree: occupational, Scientist, product/process development, or vocational program  Occupational History   Not on file  Tobacco Use   Smoking status: Never   Smokeless tobacco: Never  Vaping Use   Vaping status: Never Used  Substance and Sexual Activity   Alcohol  use: Not Currently   Drug use: No   Sexual activity: Not on file  Other Topics Concern   Not on file  Social History Narrative   Not on file   Social Drivers of Health   Financial Resource Strain: Low Risk  (06/07/2023)   Overall Financial Resource Strain (CARDIA)    Difficulty of Paying Living Expenses: Not hard at all  Food Insecurity: No Food Insecurity (06/07/2023)   Hunger Vital Sign    Worried About Running Out of Food in the Last Year: Never true    Ran Out of Food in the Last Year: Never true  Transportation Needs: No Transportation Needs (06/07/2023)   PRAPARE - Administrator, Civil Service (Medical): No    Lack of Transportation (Non-Medical): No  Physical Activity: Sufficiently Active (06/07/2023)   Exercise Vital Sign    Days of Exercise per Week: 3 days    Minutes of Exercise per Session: 50 min  Stress: No Stress Concern Present (06/07/2023)   [employer] of Occupational Health - Occupational Stress Questionnaire    Feeling of Stress : Only a little  Social Connections: Socially Integrated (06/07/2023)   Social Connection and Isolation Panel [NHANES]    Frequency of Communication with Friends and Family: Twice a week    Frequency of Social Gatherings with Friends and Family: Once a week    Attends Religious Services: 1 to 4 times per year    Active Member of [employer] or Organizations: Yes    Attends Banker Meetings: 1 to 4 times per year    Marital Status: Married  Catering manager Violence: Not on file    Review of Systems: Positive for *** All other review of systems negative except as mentioned in the HPI.  Physical Exam: Vital signs There were no vitals taken for this visit.  General:   Alert,  Well-developed, well-nourished, pleasant and cooperative in NAD Lungs:  Clear throughout to auscultation.   Heart:  Regular rate and rhythm; no murmurs, clicks, rubs,  or gallops. Abdomen:  Soft, nontender and nondistended. Normal bowel  sounds.   Neuro/Psych:  Alert and cooperative. Normal mood and affect. A and O x 3   @Brigitte Soderberg  Sena Slate, MD, [facility] [phone] (pager) 07/17/2023 7:57 PM@

## 2023-07-18 ENCOUNTER — Encounter: Payer: Self-pay | Admitting: Internal Medicine

## 2023-07-18 ENCOUNTER — Ambulatory Visit (AMBULATORY_SURGERY_CENTER): Payer: BC Managed Care – PPO | Admitting: Internal Medicine

## 2023-07-18 VITALS — BP 132/90 | HR 70 | Temp 98.1°F | Resp 14 | Ht 71.0 in | Wt 240.0 lb

## 2023-07-18 DIAGNOSIS — D125 Benign neoplasm of sigmoid colon: Secondary | ICD-10-CM

## 2023-07-18 DIAGNOSIS — K5730 Diverticulosis of large intestine without perforation or abscess without bleeding: Secondary | ICD-10-CM | POA: Diagnosis not present

## 2023-07-18 DIAGNOSIS — Z1211 Encounter for screening for malignant neoplasm of colon: Secondary | ICD-10-CM

## 2023-07-18 MED ORDER — SODIUM CHLORIDE 0.9 % IV SOLN: 500.0000 mL | Freq: Once | INTRAVENOUS | Status: DC

## 2023-07-18 NOTE — Progress Notes (Signed)
 Vss nad trans to pacu

## 2023-07-18 NOTE — Progress Notes (Signed)
 Pt's states no medical or surgical changes since previsit or office visit.

## 2023-07-18 NOTE — Patient Instructions (Addendum)
 There was a very small polyp found and removed.  You also have a condition called diverticulosis - common and not usually a problem. Please read the handout provided.  I will let you know pathology results and when to have another routine colonoscopy by mail and/or My Chart.  I appreciate the opportunity to care for you. Iva Boop, MD, [facility]  Resume previous diet Continue present medications  Handouts/information given for polyps and diverticulosis   YOU HAD AN ENDOSCOPIC PROCEDURE TODAY AT THE [facility]:   Refer to the procedure report that was given to you for any specific questions about what was found during the examination.  If the procedure report does not answer your questions, please call your gastroenterologist to clarify.  If you requested that your care partner not be given the details of your procedure findings, then the procedure report has been included in a sealed envelope for you to review at your convenience later.  YOU SHOULD EXPECT: Some feelings of bloating in the abdomen. Passage of more gas than usual.  Walking can help get rid of the air that was put into your GI tract during the procedure and reduce the bloating. If you had a lower endoscopy (such as a colonoscopy or flexible sigmoidoscopy) you may notice spotting of blood in your stool or on the toilet paper. If you underwent a bowel prep for your procedure, you may not have a normal bowel movement for a few days.  Please Note:  You might notice some irritation and congestion in your nose or some drainage.  This is from the oxygen used during your procedure.  There is no need for concern and it should clear up in a day or so.  SYMPTOMS TO REPORT IMMEDIATELY: Following lower endoscopy (colonoscopy):  Excessive amounts of blood in the stool  Significant tenderness or worsening of abdominal pains  Swelling of the abdomen that is new, acute  Fever of 100F or higher  For urgent or emergent issues, a  gastroenterologist can be reached at any hour by calling [phone]. Do not use MyChart messaging for urgent concerns.   DIET:  We do recommend a small meal at first, but then you may proceed to your regular diet.  Drink plenty of fluids but you should avoid alcoholic beverages for 24 hours.  ACTIVITY:  You should plan to take it easy for the rest of today and you should NOT DRIVE or use heavy machinery until tomorrow (because of the sedation medicines used during the test).    FOLLOW UP: Our staff will call the number listed on your records the next business day following your procedure.  We will call around 7:15- 8:00 am to check on you and address any questions or concerns that you may have regarding the information given to you following your procedure. If we do not reach you, we will leave a message.     If any biopsies were taken you will be contacted by phone or by letter within the next 1-3 weeks.  Please call us at [phone] if you have not heard about the biopsies in 3 weeks.   SIGNATURES/CONFIDENTIALITY: You and/or your care partner have signed paperwork which will be entered into your electronic medical record.  These signatures attest to the fact that that the information above on your After Visit Summary has been reviewed and is understood.  Full responsibility of the confidentiality of this discharge information lies with you and/or your care-partner.

## 2023-07-18 NOTE — Op Note (Signed)
 [facility] Patient Name: Melvin Roberts Procedure Date: 07/18/2023 9:28 AM MRN: [id number] Endoscopist: Iva Boop , MD, [id number] Age: 63 Referring MD:  Date of Birth: May 25, 1960 Gender: Male Account #: [id number] Procedure:                Colonoscopy Indications:              Screening for colorectal malignant neoplasm, Last                            colonoscopy: 2015 Medicines:                Monitored Anesthesia Care Procedure:                Pre-Anesthesia Assessment:                           - Prior to the procedure, a History and Physical                            was performed, and patient medications and                            allergies were reviewed. The patient's tolerance of                            previous anesthesia was also reviewed. The risks                            and benefits of the procedure and the sedation                            options and risks were discussed with the patient.                            All questions were answered, and informed consent                            was obtained. Prior Anticoagulants: The patient has                            taken no anticoagulant or antiplatelet agents. ASA                            Grade Assessment: II - A patient with mild systemic                            disease. After reviewing the risks and benefits,                            the patient was deemed in satisfactory condition to                            undergo the procedure.  After obtaining informed consent, the colonoscope                            was passed under direct vision. Throughout the                            procedure, the patient's blood pressure, pulse, and                            oxygen saturations were monitored continuously. The                            Olympus Scope SN: [id number] was introduced through                            the anus and advanced to the the  cecum, identified                            by appendiceal orifice and ileocecal valve. The                            colonoscopy was performed without difficulty. The                            patient tolerated the procedure well. The quality                            of the bowel preparation was good. The ileocecal                            valve, appendiceal orifice, and rectum were                            photographed. The bowel preparation used was                            SUFLAVE via split dose instruction. Scope In: 9:34:24 AM Scope Out: 9:47:52 AM Scope Withdrawal Time: 0 hours 10 minutes 40 seconds  Total Procedure Duration: 0 hours 13 minutes 28 seconds  Findings:                 The perianal and digital rectal examinations were                            normal. Pertinent negatives include normal prostate                            (size, shape, and consistency).                           A 4 mm polyp was found in the sigmoid colon. The                            polyp was sessile. The polyp was removed with  a                            cold snare. Resection and retrieval were complete.                            Verification of patient identification for the                            specimen was done. Estimated blood loss was minimal.                           Multiple diverticula were found in the sigmoid                            colon.                           The exam was otherwise without abnormality on                            direct and retroflexion views. Complications:            No immediate complications. Estimated Blood Loss:     Estimated blood loss was minimal. Impression:               - One 4 mm polyp in the sigmoid colon, removed with                            a cold snare. Resected and retrieved.                           - Diverticulosis in the sigmoid colon.                           - The examination was otherwise normal on direct                             and retroflexion views. Recommendation:           - Patient has a contact number available for                            emergencies. The signs and symptoms of potential                            delayed complications were discussed with the                            patient. Return to normal activities tomorrow.                            Written discharge instructions were provided to the                            patient.                           -  Resume previous diet.                           - Continue present medications.                           - Repeat colonoscopy is recommended. The                            colonoscopy date will be determined after pathology                            results from today's exam become available for                            review. Iva Boop, MD 07/18/2023 9:53:40 AM This report has been signed electronically.

## 2023-07-18 NOTE — Progress Notes (Signed)
 Called to room to assist during endoscopic procedure.  Patient ID and intended procedure confirmed with present staff. Received instructions for my participation in the procedure from the performing physician.

## 2023-07-19 ENCOUNTER — Telehealth: Payer: Self-pay

## 2023-07-19 NOTE — Telephone Encounter (Signed)
 No answer, unable to leave a message, B.Kytzia Gienger RN.

## 2023-07-20 LAB — SURGICAL PATHOLOGY

## 2023-07-24 ENCOUNTER — Ambulatory Visit: Admitting: Family

## 2023-07-24 ENCOUNTER — Encounter: Payer: Self-pay | Admitting: Family

## 2023-07-24 VITALS — BP 129/86 | HR 71 | Temp 98.0°F | Ht 71.0 in | Wt 243.0 lb

## 2023-07-24 DIAGNOSIS — T560X4A Toxic effect of lead and its compounds, undetermined, initial encounter: Secondary | ICD-10-CM | POA: Diagnosis not present

## 2023-07-24 DIAGNOSIS — M10171 Lead-induced gout, right ankle and foot: Secondary | ICD-10-CM | POA: Diagnosis not present

## 2023-07-24 MED ORDER — METHYLPREDNISOLONE ACETATE 80 MG/ML IJ SUSP
80.0000 mg | Freq: Once | INTRAMUSCULAR | Status: AC
  Administered 2023-07-24: 80 mg via INTRAMUSCULAR

## 2023-07-24 NOTE — Progress Notes (Signed)
 Subjective:    Patient ID: Melvin Roberts, male    DOB: 1961-01-03, 63 y.o.   MRN: [id number]  Chief Complaint  Patient presents with   Gout    Right foot. Started a couple days ago    PT presents to the office today with right great toe pain, swelling, redness that started two days ago. Has hx of gout and feels similar. He is taking allopurinol 300 mg daily. Started colchicine twice a day that he started yesterday.  Toe Pain  There was no injury mechanism. The pain is present in the right toes. The quality of the pain is described as aching. The pain is at a severity of 9/10. The pain is moderate. The pain has been Constant since onset. Pertinent negatives include no loss of motion, loss of sensation, numbness or tingling. He reports no foreign bodies present. He has tried rest for the symptoms. The treatment provided mild relief.      Review of Systems  Neurological:  Negative for tingling and numbness.  All other systems reviewed and are negative.   Social History   Socioeconomic History   Marital status: Married    Spouse name: Not on file   Number of children: Not on file   Years of education: Not on file   Highest education level: Associate degree: occupational, Scientist, product/process development, or vocational program  Occupational History   Not on file  Tobacco Use   Smoking status: Never   Smokeless tobacco: Never  Vaping Use   Vaping status: Never Used  Substance and Sexual Activity   Alcohol use: Not Currently   Drug use: No   Sexual activity: Not on file  Other Topics Concern   Not on file  Social History Narrative   Not on file   Social Drivers of Health   Financial Resource Strain: Low Risk  (06/07/2023)   Overall Financial Resource Strain (CARDIA)    Difficulty of Paying Living Expenses: Not hard at all  Food Insecurity: No Food Insecurity (06/07/2023)   Hunger Vital Sign    Worried About Running Out of Food in the Last Year: Never true    Ran Out of Food in the Last  Year: Never true  Transportation Needs: No Transportation Needs (06/07/2023)   PRAPARE - Administrator, Civil Service (Medical): No    Lack of Transportation (Non-Medical): No  Physical Activity: Sufficiently Active (06/07/2023)   Exercise Vital Sign    Days of Exercise per Week: 3 days    Minutes of Exercise per Session: 50 min  Stress: No Stress Concern Present (06/07/2023)   [employer] of Occupational Health - Occupational Stress Questionnaire    Feeling of Stress : Only a little  Social Connections: Socially Integrated (06/07/2023)   Social Connection and Isolation Panel [NHANES]    Frequency of Communication with Friends and Family: Twice a week    Frequency of Social Gatherings with Friends and Family: Once a week    Attends Religious Services: 1 to 4 times per year    Active Member of [employer] or Organizations: Yes    Attends Banker Meetings: 1 to 4 times per year    Marital Status: Married   Family History  Problem Relation Age of Onset   Cancer Mother        breast   Cancer Father        lug   Esophageal cancer Maternal Grandfather    Cancer Maternal Grandfather  Cancer Paternal Grandfather    Colon cancer Neg Hx    Colon polyps Neg Hx    Rectal cancer Neg Hx    Stomach cancer Neg Hx         Objective:   Physical Exam Vitals reviewed.  Constitutional:      General: He is not in acute distress.    Appearance: He is well-developed.  HENT:     Head: Normocephalic.  Eyes:     General:        Right eye: No discharge.        Left eye: No discharge.     Pupils: Pupils are equal, round, and reactive to light.  Neck:     Thyroid: No thyromegaly.  Cardiovascular:     Rate and Rhythm: Normal rate and regular rhythm.     Heart sounds: Normal heart sounds. No murmur heard. Pulmonary:     Effort: Pulmonary effort is normal. No respiratory distress.     Breath sounds: Normal breath sounds. No wheezing.  Abdominal:     General: Bowel  sounds are normal. There is no distension.     Palpations: Abdomen is soft.     Tenderness: There is no abdominal tenderness.  Musculoskeletal:        General: No tenderness. Normal range of motion.     Cervical back: Normal range of motion and neck supple.       Feet:  Feet:     Comments: Right great toe erythemas, swelling, warmth, and tenderness Skin:    General: Skin is warm and dry.     Findings: No erythema or rash.  Neurological:     Mental Status: He is alert and oriented to person, place, and time.     Cranial Nerves: No cranial nerve deficit.     Deep Tendon Reflexes: Reflexes are normal and symmetric.  Psychiatric:        Behavior: Behavior normal.        Thought Content: Thought content normal.        Judgment: Judgment normal.       BP 129/86   Pulse 71   Temp 98 F (36.7 C) (Temporal)   Ht 5\' 11"  (1.803 m)   Wt 243 lb (110.2 kg)   BMI 33.89 kg/m      Assessment & Plan:  [name] comes in today with chief complaint of Gout (Right foot. Started a couple days ago )   Diagnosis and orders addressed:  1. Acute lead-induced gout involving toe of right foot, initial encounter (Primary) Force fluids Continue allopurinol  Colchicine BID until pain resolves Low purine diet  Follow up if symptoms worsen or do not improve  - methylPREDNISolone acetate (DEPO-MEDROL) injection 80 mg      Jannifer Rodney, FNP

## 2023-07-24 NOTE — Patient Instructions (Signed)
Gout  Gout is a condition that causes painful swelling of the joints. Gout is a type of inflammation of the joints (arthritis). This condition is caused by having too much uric acid in the body. Uric acid is a chemical that forms when the body breaks down substances called purines. Purines are important for building body proteins. When the body has too much uric acid, sharp crystals can form and build up inside the joints. This causes pain and swelling. Gout attacks can happen quickly and may be very painful (acute gout). Over time, the attacks can affect more joints and become more frequent (chronic gout). Gout can also cause uric acid to build up under the skin and inside the kidneys. What are the causes? This condition is caused by too much uric acid in your blood. This can happen because: Your kidneys do not remove enough uric acid from your blood. This is the most common cause. Your body makes too much uric acid. This can happen with some cancers and cancer treatments. It can also occur if your body is breaking down too many red blood cells (hemolytic anemia). You eat too many foods that are high in purines. These foods include organ meats and some seafood. Alcohol, especially beer, is also high in purines. A gout attack may be triggered by trauma or stress. What increases the risk? The following factors may make you more likely to develop this condition: Having a family history of gout. Being male and middle-aged. Being male and having gone through menopause. Taking certain medicines, including aspirin, cyclosporine, diuretics, levodopa, and niacin. Having an organ transplant. Having certain conditions, such as: Being obese. Lead poisoning. Kidney disease. A skin condition called psoriasis. Other factors include: Losing weight too quickly. Being dehydrated. Frequently drinking alcohol, especially beer. Frequently drinking beverages that are sweetened with a type of sugar called  fructose. What are the signs or symptoms? An attack of acute gout happens quickly. It usually occurs in just one joint. The most common place is the big toe. Attacks often start at night. Other joints that may be affected include joints of the feet, ankle, knee, fingers, wrist, or elbow. Symptoms of this condition may include: Severe pain. Warmth. Swelling. Stiffness. Tenderness. The affected joint may be very painful to touch. Shiny, red, or purple skin. Chills and fever. Chronic gout may cause symptoms more frequently. More joints may be involved. You may also have white or yellow lumps (tophi) on your hands or feet or in other areas near your joints. How is this diagnosed? This condition is diagnosed based on your symptoms, your medical history, and a physical exam. You may have tests, such as: Blood tests to measure uric acid levels. Removal of joint fluid with a thin needle (aspiration) to look for uric acid crystals. X-rays to look for joint damage. How is this treated? Treatment for this condition has two phases: treating an acute attack and preventing future attacks. Acute gout treatment may include medicines to reduce pain and swelling, including: NSAIDs, such as ibuprofen. Steroids. These are strong anti-inflammatory medicines that can be taken by mouth (orally) or injected into a joint. Colchicine. This medicine relieves pain and swelling when it is taken soon after an attack. It can be given by mouth or through an IV. Preventive treatment may include: Daily use of smaller doses of NSAIDs or colchicine. Use of a medicine that reduces uric acid levels in your blood, such as allopurinol. Changes to your diet. You may need to see   a dietitian about what to eat and drink to prevent gout. Follow these instructions at home: During a gout attack  If directed, put ice on the affected area. To do this: Put ice in a plastic bag. Place a towel between your skin and the bag. Leave the  ice on for 20 minutes, 2-3 times a day. Remove the ice if your skin turns bright red. This is very important. If you cannot feel pain, heat, or cold, you have a greater risk of damage to the area. Raise (elevate) the affected joint above the level of your heart as often as possible. Rest the joint as much as possible. If the affected joint is in your leg, you may be given crutches to use. Follow instructions from your health care provider about eating or drinking restrictions. Avoiding future gout attacks Follow a low-purine diet as told by your dietitian or health care provider. Avoid foods and drinks that are high in purines, including liver, kidney, anchovies, asparagus, herring, mushrooms, mussels, and beer. Maintain a healthy weight or lose weight if you are overweight. If you want to lose weight, talk with your health care provider. Do not lose weight too quickly. Start or maintain an exercise program as told by your health care provider. Eating and drinking Avoid drinking beverages that contain fructose. Drink enough fluids to keep your urine pale yellow. If you drink alcohol: Limit how much you have to: 0-1 drink a day for women who are not pregnant. 0-2 drinks a day for men. Know how much alcohol is in a drink. In the [place] one drink equals one 12 oz bottle of beer (355 mL), one 5 oz glass of wine (148 mL), or one 1 oz glass of hard liquor (44 mL). General instructions Take over-the-counter and prescription medicines only as told by your health care provider. Ask your health care provider if the medicine prescribed to you requires you to avoid driving or using machinery. Return to your normal activities as told by your health care provider. Ask your health care provider what activities are safe for you. Keep all follow-up visits. This is important. Where to find more information National Institutes of Health: www.niams.nih.gov Contact a health care provider if you have: Another  gout attack. Continuing symptoms of a gout attack after 10 days of treatment. Side effects from your medicines. Chills or a fever. Burning pain when you urinate. Pain in your lower back or abdomen. Get help right away if you: Have severe or uncontrolled pain. Cannot urinate. Summary Gout is painful swelling of the joints caused by having too much uric acid in the body. The most common site for gout to occur is in the big toe, but it can affect other joints in the body. Medicines and dietary changes can help to prevent and treat gout attacks. This information is not intended to replace advice given to you by your health care provider. Make sure you discuss any questions you have with your health care provider. Document Revised: 01/06/2021 Document Reviewed: 01/06/2021 Elsevier Patient Education  2024 Elsevier Inc.  

## 2023-07-25 ENCOUNTER — Encounter: Payer: Self-pay | Admitting: Internal Medicine

## 2023-07-28 ENCOUNTER — Encounter: Payer: Self-pay | Admitting: Adult Health

## 2023-07-28 ENCOUNTER — Telehealth: Admitting: Adult Health

## 2023-07-28 DIAGNOSIS — F411 Generalized anxiety disorder: Secondary | ICD-10-CM | POA: Diagnosis not present

## 2023-07-28 DIAGNOSIS — F319 Bipolar disorder, unspecified: Secondary | ICD-10-CM | POA: Diagnosis not present

## 2023-07-28 DIAGNOSIS — G4700 Insomnia, unspecified: Secondary | ICD-10-CM | POA: Diagnosis not present

## 2023-07-28 DIAGNOSIS — Z79899 Other long term (current) drug therapy: Secondary | ICD-10-CM | POA: Diagnosis not present

## 2023-07-28 NOTE — Progress Notes (Signed)
 Melvin Roberts [id number] 1960-09-22 63 y.o.  Virtual Visit via Video Note  I connected with pt @ on 07/28/23 at 10:00 AM EDT by a video enabled telemedicine application and verified that I am speaking with the correct person using two identifiers.   I discussed the limitations of evaluation and management by telemedicine and the availability of in person appointments. The patient expressed understanding and agreed to proceed.  I discussed the assessment and treatment plan with the patient. The patient was provided an opportunity to ask questions and all were answered. The patient agreed with the plan and demonstrated an understanding of the instructions.   The patient was advised to call back or seek an in-person evaluation if the symptoms worsen or if the condition fails to improve as anticipated.  I provided 25 minutes of non-face-to-face time during this encounter.  The patient was located at home.  The provider was located at [facility].   Dorothyann Gibbs, NP   Subjective:   Patient ID:  Melvin Roberts is a 63 y.o. (DOB 12-04-1960) male.  Chief Complaint: No chief complaint on file.   HPI Melvin Roberts presents for follow-up of Bipolar Disorder, GAD and insomnia.  Accompanied by wife.  Describes mood today as "ok". Mood symptoms - denies depression and anxiety. Reports irritability at times. Reports stable interest and motivation. Denies panic attacks. Reports some over thinking - planning for upcoming trip. Reports some worry and rumination. Reports some health issues. Reports mood is stable. Stating "I feel like I'm doing ok". Currently taking Olanzapine and Depakote. Taking medications as prescribed. Energy levels stable. Active, does not have a regular exercise routine. Enjoys some usual interests and activities. Married. Lives with wife. Has 5 children and 11 grandchildren. Spending time with family. Appetite adequate. Weight 240  pounds. Reports sleep has improved. Averages 6 to 7 hours or more. Reports napping a few times a week. Reports focus and concentration difficulties - "ok right now". Managing minimal aspects of household. Disabled. Denies SI or HI.  Denies AH or VH. Denies self harm. Denies substance use. Reports hearing loss.  Previous medication trials: Lithium, Seroquel  Review of Systems:  Review of Systems  Musculoskeletal:  Negative for gait problem.  Neurological:  Negative for tremors.  Psychiatric/Behavioral:         Please refer to HPI    Medications: I have reviewed the patient's current medications.  Current Outpatient Medications  Medication Sig Dispense Refill   allopurinol (ZYLOPRIM) 300 MG tablet Take 1 tablet (300 mg total) by mouth daily. 90 tablet 1   carbidopa-levodopa (SINEMET) 10-100 MG tablet Take 2 tablets by mouth 3 (three) times daily. For parkinsonism 540 tablet 3   cholecalciferol (VITAMIN D) 1000 UNITS tablet Take 1,000 Units by mouth daily.     colchicine 0.6 MG tablet Take twice daily for gout attack. (may take every two hours up to 6 doses at acute onset) 60 tablet 2   divalproex (DEPAKOTE) 500 MG DR tablet Take two tablets in the morning and take two tablets at bedtime. 360 tablet 1   donepezil (ARICEPT) 5 MG tablet TAKE 1 TABLET BY MOUTH EVERYDAY AT BEDTIME 90 tablet 0   methocarbamol (ROBAXIN) 500 MG tablet Take 1 tablet (500 mg total) by mouth 4 (four) times daily. 120 tablet 1   OLANZapine (ZYPREXA) 5 MG tablet Take 1 tablet (5 mg total) by mouth at bedtime. 90 tablet 3   pantoprazole (PROTONIX) 40 MG tablet Take 1 tablet (  40 mg total) by mouth daily. 90 tablet 3   No current facility-administered medications for this visit.    Medication Side Effects: None  Allergies:  Allergies  Allergen Reactions   Lipitor [Atorvastatin] Other (See Comments)   Niacin Other (See Comments)    Hot flash    Past Medical History:  Diagnosis Date   Allergy    Bipolar  1 disorder (HCC)    GERD (gastroesophageal reflux disease)    Hyperlipidemia    Hypertension     Family History  Problem Relation Age of Onset   Cancer Mother        breast   Cancer Father        lug   Esophageal cancer Maternal Grandfather    Cancer Maternal Grandfather    Cancer Paternal Grandfather    Colon cancer Neg Hx    Colon polyps Neg Hx    Rectal cancer Neg Hx    Stomach cancer Neg Hx     Social History   Socioeconomic History   Marital status: Married    Spouse name: Not on file   Number of children: Not on file   Years of education: Not on file   Highest education level: Associate degree: occupational, Scientist, product/process development, or vocational program  Occupational History   Not on file  Tobacco Use   Smoking status: Never   Smokeless tobacco: Never  Vaping Use   Vaping status: Never Used  Substance and Sexual Activity   Alcohol use: Not Currently   Drug use: No   Sexual activity: Not on file  Other Topics Concern   Not on file  Social History Narrative   Not on file   Social Drivers of Health   Financial Resource Strain: Low Risk  (06/07/2023)   Overall Financial Resource Strain (CARDIA)    Difficulty of Paying Living Expenses: Not hard at all  Food Insecurity: No Food Insecurity (06/07/2023)   Hunger Vital Sign    Worried About Running Out of Food in the Last Year: Never true    Ran Out of Food in the Last Year: Never true  Transportation Needs: No Transportation Needs (06/07/2023)   PRAPARE - Administrator, Civil Service (Medical): No    Lack of Transportation (Non-Medical): No  Physical Activity: Sufficiently Active (06/07/2023)   Exercise Vital Sign    Days of Exercise per Week: 3 days    Minutes of Exercise per Session: 50 min  Stress: No Stress Concern Present (06/07/2023)   [employer] of Occupational Health - Occupational Stress Questionnaire    Feeling of Stress : Only a little  Social Connections: Socially Integrated (06/07/2023)    Social Connection and Isolation Panel [NHANES]    Frequency of Communication with Friends and Family: Twice a week    Frequency of Social Gatherings with Friends and Family: Once a week    Attends Religious Services: 1 to 4 times per year    Active Member of [employer] or Organizations: Yes    Attends Banker Meetings: 1 to 4 times per year    Marital Status: Married  Catering manager Violence: Not on file    Past Medical History, Surgical history, Social history, and Family history were reviewed and updated as appropriate.   Please see review of systems for further details on the patient's review from today.   Objective:   Physical Exam:  There were no vitals taken for this visit.  Physical Exam Constitutional:  General: He is not in acute distress. Musculoskeletal:        General: No deformity.  Neurological:     Mental Status: He is alert and oriented to person, place, and time.     Coordination: Coordination normal.  Psychiatric:        Attention and Perception: Attention and perception normal. He does not perceive auditory or visual hallucinations.        Mood and Affect: Mood normal. Affect is not labile, blunt, angry or inappropriate.        Speech: Speech normal.        Behavior: Behavior normal.        Thought Content: Thought content normal. Thought content is not paranoid or delusional. Thought content does not include homicidal or suicidal ideation. Thought content does not include homicidal or suicidal plan.        Cognition and Memory: Cognition and memory normal.        Judgment: Judgment normal.     Comments: Insight intact     Lab Review:     Component Value Date/Time   NA 143 04/20/2023 1052   K 5.3 (H) 04/20/2023 1052   CL 105 04/20/2023 1052   CO2 22 04/20/2023 1052   GLUCOSE 94 04/20/2023 1052   GLUCOSE 117 (H) 02/26/2022 2205   BUN 25 04/20/2023 1052   CREATININE 1.36 (H) 04/20/2023 1052   CALCIUM 10.0 05/08/2023 1016   PROT 7.2  04/20/2023 1052   ALBUMIN 4.4 04/20/2023 1052   AST 66 (H) 04/20/2023 1052   ALT 52 (H) 04/20/2023 1052   ALKPHOS 59 04/20/2023 1052   BILITOT 0.2 04/20/2023 1052   GFRNONAA >60 02/26/2022 2205   GFRAA 61 05/29/2020 0854       Component Value Date/Time   WBC 6.4 04/20/2023 1052   WBC 9.1 02/26/2022 2205   RBC 4.45 04/20/2023 1052   RBC 4.00 (L) 02/26/2022 2205   HGB 13.9 04/20/2023 1052   HCT 41.2 04/20/2023 1052   PLT 171 04/20/2023 1052   MCV 93 04/20/2023 1052   MCH 31.2 04/20/2023 1052   MCH 31.0 02/26/2022 2205   MCHC 33.7 04/20/2023 1052   MCHC 33.2 02/26/2022 2205   RDW 14.6 04/20/2023 1052   LYMPHSABS 1.5 04/20/2023 1052   MONOABS 0.6 02/26/2022 2205   EOSABS 0.2 04/20/2023 1052   BASOSABS 0.0 04/20/2023 1052    Lithium Lvl  Date Value Ref Range Status  06/09/2022 1.3 (HH) 0.5 - 1.2 mmol/L Final    Comment:    A concentration of 0.5-0.8 mmol/L is advised for long-term use; concentrations of up to 1.2 mmol/L may be necessary during acute treatment.                                  Detection Limit = 0.1                           <0.1 indicates None Detected Patient drug level exceeds published reference range.  Evaluate clinically for signs of potential toxicity.      Lab Results  Component Value Date   VALPROATE 79 04/20/2023     .res Assessment: Plan:    Plan:  PDMP reviewed  Continue: Zyprexa 5mg  at hs. Depakote 1000mg  BID.  Wife reporting a few falls recently.  04/24 Neuropsych evaluation  RTC 4 weeks   25 minutes spent dedicated to  the care of this patient on the date of this encounter to include pre-visit review of records, ordering of medication, post visit documentation, and face-to-face time with the patient discussing Bipolar Disorder, GAD and insomnia. Discussed continuing current medication regimen.  Patient totally disabled an unable to work with current disabilities - mental health issues Bipolar disorder and recently diagnosed  with Parkinson's with Lewy Body Dementia.   Discussed potential metabolic side effects associated with atypical antipsychotics, as well as potential risk for movement side effects. Advised pt to contact office if movement side effects occur.    Patient advised to contact office with any questions, adverse effects, or acute worsening in signs and symptoms.  Diagnoses and all orders for this visit:  Bipolar I disorder (HCC)  Generalized anxiety disorder  Insomnia, unspecified type  High risk medication use     Please see After Visit Summary for patient specific instructions.  Future Appointments  Date Time Provider Department Center  08/10/2023  3:00 PM Hershal Coria, PsyD [facility]  08/21/2023 10:25 AM Mechele Claude, MD [facility] None  10/23/2023 10:30 AM Dohmeier, Porfirio Mylar, MD [facility] None    No orders of the defined types were placed in this encounter.     -------------------------------

## 2023-08-03 ENCOUNTER — Encounter: Payer: Self-pay | Admitting: Family Medicine

## 2023-08-03 ENCOUNTER — Ambulatory Visit: Admitting: Family Medicine

## 2023-08-03 VITALS — BP 127/81 | HR 60 | Temp 98.0°F | Ht 71.0 in | Wt 240.0 lb

## 2023-08-03 DIAGNOSIS — M25531 Pain in right wrist: Secondary | ICD-10-CM | POA: Diagnosis not present

## 2023-08-03 MED ORDER — METHYLPREDNISOLONE ACETATE 40 MG/ML IJ SUSP
40.0000 mg | Freq: Once | INTRAMUSCULAR | Status: AC
Start: 2023-08-03 — End: 2023-08-03
  Administered 2023-08-03: 40 mg via INTRAMUSCULAR

## 2023-08-03 NOTE — Patient Instructions (Addendum)
 Colchicine  1.2 mg at the first sign of flare, followed by 0.6 mg after 1 hour.  maximum total dose: 1.8 mg/day on day 1 0.6 mg once or twice daily until flare resolves

## 2023-08-03 NOTE — Progress Notes (Signed)
 Subjective:  Patient ID: Melvin Roberts, male    DOB: 10-16-1960, 63 y.o.   MRN: [id number]  Patient Care Team: Mechele Claude, MD as PCP - General (Family Medicine)   Chief Complaint:  right wrist swollen/pain   HPI: Melvin Roberts is a 63 y.o. male presenting on 08/03/2023 for right wrist swollen/pain   HPI States that he slept on his wrist wrong and it is now swollen. States that it started 3 days ago. He is taking extra strength tylenol. He is unsure if he is taking colchicine. States that when he had a flare in his foot, he received steroid shot and it helped a lot.   Relevant past medical, surgical, family, and social history reviewed and updated as indicated.  Allergies and medications reviewed and updated. Data reviewed: Chart in Epic.   Past Medical History:  Diagnosis Date   Allergy    Bipolar 1 disorder (HCC)    GERD (gastroesophageal reflux disease)    Hyperlipidemia    Hypertension     Past Surgical History:  Procedure Laterality Date   BACK SURGERY  11/17/1995   Dr. Shelle Iron   COLONOSCOPY     EYE SURGERY     HERNIA REPAIR  04/19/2007   umbilical   UPPER GASTROINTESTINAL ENDOSCOPY  x2   w/ esophageal dilation    Social History   Socioeconomic History   Marital status: Married    Spouse name: Not on file   Number of children: Not on file   Years of education: Not on file   Highest education level: Associate degree: occupational, Scientist, product/process development, or vocational program  Occupational History   Not on file  Tobacco Use   Smoking status: Never   Smokeless tobacco: Never  Vaping Use   Vaping status: Never Used  Substance and Sexual Activity   Alcohol use: Not Currently   Drug use: No   Sexual activity: Not on file  Other Topics Concern   Not on file  Social History Narrative   Not on file   Social Drivers of Health   Financial Resource Strain: Low Risk  (06/07/2023)   Overall Financial Resource Strain (CARDIA)    Difficulty of Paying  Living Expenses: Not hard at all  Food Insecurity: No Food Insecurity (06/07/2023)   Hunger Vital Sign    Worried About Running Out of Food in the Last Year: Never true    Ran Out of Food in the Last Year: Never true  Transportation Needs: No Transportation Needs (06/07/2023)   PRAPARE - Administrator, Civil Service (Medical): No    Lack of Transportation (Non-Medical): No  Physical Activity: Sufficiently Active (06/07/2023)   Exercise Vital Sign    Days of Exercise per Week: 3 days    Minutes of Exercise per Session: 50 min  Stress: No Stress Concern Present (06/07/2023)   [employer] of Occupational Health - Occupational Stress Questionnaire    Feeling of Stress : Only a little  Social Connections: Socially Integrated (06/07/2023)   Social Connection and Isolation Panel [NHANES]    Frequency of Communication with Friends and Family: Twice a week    Frequency of Social Gatherings with Friends and Family: Once a week    Attends Religious Services: 1 to 4 times per year    Active Member of [employer] or Organizations: Yes    Attends Banker Meetings: 1 to 4 times per year    Marital Status: Married  Catering manager Violence:  Not on file    Outpatient Encounter Medications as of 08/03/2023  Medication Sig   allopurinol (ZYLOPRIM) 300 MG tablet Take 1 tablet (300 mg total) by mouth daily.   carbidopa-levodopa (SINEMET) 10-100 MG tablet Take 2 tablets by mouth 3 (three) times daily. For parkinsonism   cholecalciferol (VITAMIN D) 1000 UNITS tablet Take 1,000 Units by mouth daily.   colchicine 0.6 MG tablet Take twice daily for gout attack. (may take every two hours up to 6 doses at acute onset)   divalproex (DEPAKOTE) 500 MG DR tablet Take two tablets in the morning and take two tablets at bedtime.   donepezil (ARICEPT) 5 MG tablet TAKE 1 TABLET BY MOUTH EVERYDAY AT BEDTIME   methocarbamol (ROBAXIN) 500 MG tablet Take 1 tablet (500 mg total) by mouth 4 (four) times  daily.   OLANZapine (ZYPREXA) 5 MG tablet Take 1 tablet (5 mg total) by mouth at bedtime.   pantoprazole (PROTONIX) 40 MG tablet Take 1 tablet (40 mg total) by mouth daily.   celecoxib (CELEBREX) 200 MG capsule Take 200 mg by mouth 2 (two) times daily as needed. (Patient not taking: Reported on 08/03/2023)   No facility-administered encounter medications on file as of 08/03/2023.    Allergies  Allergen Reactions   Lipitor [Atorvastatin] Other (See Comments)   Niacin Other (See Comments)    Hot flash    Review of Systems As per HPI  Objective:  BP 127/81   Pulse 60   Temp 98 F (36.7 C)   Ht 5\' 11"  (1.803 m)   Wt 240 lb (108.9 kg)   SpO2 96%   BMI 33.47 kg/m    Wt Readings from Last 3 Encounters:  08/03/23 240 lb (108.9 kg)  07/24/23 243 lb (110.2 kg)  07/18/23 240 lb (108.9 kg)    Physical Exam Constitutional:      General: He is awake. He is not in acute distress.    Appearance: Normal appearance. He is well-developed and well-groomed. He is not ill-appearing, toxic-appearing or diaphoretic.  Cardiovascular:     Rate and Rhythm: Normal rate and regular rhythm.     Pulses: Normal pulses.          Radial pulses are 2+ on the right side and 2+ on the left side.       Posterior tibial pulses are 2+ on the right side and 2+ on the left side.     Heart sounds: Normal heart sounds. No murmur heard.    No gallop.  Pulmonary:     Effort: Pulmonary effort is normal. No respiratory distress.     Breath sounds: Normal breath sounds. No stridor. No wheezing, rhonchi or rales.  Musculoskeletal:     Right wrist: Swelling and tenderness present. No deformity, effusion, lacerations, bony tenderness, snuff box tenderness or crepitus. Decreased range of motion. Normal pulse.     Cervical back: Full passive range of motion without pain and neck supple.     Right lower leg: No edema.     Left lower leg: No edema.  Skin:    General: Skin is warm.     Capillary Refill: Capillary refill  takes less than 2 seconds.  Neurological:     General: No focal deficit present.     Mental Status: He is alert, oriented to person, place, and time and easily aroused. Mental status is at baseline.     GCS: GCS eye subscore is 4. GCS verbal subscore is 5. GCS motor subscore is  6.     Motor: No weakness.  Psychiatric:        Attention and Perception: Attention and perception normal.        Mood and Affect: Mood and affect normal.        Speech: Speech normal.        Behavior: Behavior normal. Behavior is cooperative.        Thought Content: Thought content normal. Thought content does not include homicidal or suicidal ideation. Thought content does not include homicidal or suicidal plan.        Cognition and Memory: Cognition and memory normal.        Judgment: Judgment normal.     Results for orders placed or performed in visit on 07/18/23  Surgical pathology ([facility])   Collection Time: 07/18/23 12:00 AM  Result Value Ref Range   SURGICAL PATHOLOGY      SURGICAL PATHOLOGY [facility] [address], [address] [place], [place] [address] Telephone [phone] or [phone] Fax [phone]  REPORT OF SURGICAL PATHOLOGY   Accession #: [id number] Patient Name: JIMMEY, HENGEL Visit # : [id number]  MRN: [id number] Physician: Stan Head DOB/Age [birth year] (Age: 7) Gender: M Collected Date: 07/18/2023 Received Date: 07/19/2023  FINAL DIAGNOSIS       1. Surgical [P], colon, sigmoid, polyp (1) :       TUBULAR ADENOMA      NEGATIVE FOR HIGH-GRADE DYSPLASIA AND CARCINOMA       DATE SIGNED OUT: 07/20/2023 ELECTRONIC SIGNATURE : Picklesimer Md, Fred , Sports administrator, Electronic Signature  MICROSCOPIC DESCRIPTION  CASE COMMENTS STAINS USED IN DIAGNOSIS: H&E    CLINICAL HISTORY  SPECIMEN(S) OBTAINED 1. Surgical [P], Colon, Sigmoid, Polyp (1)  SPECIMEN COMMENTS: 1. Special screening for malignant neoplasms, colon; benign neoplasm of  sigmoid colon SPECIMEN CLINICAL INFORMATION: 1 . R/O adenoma    Gross Description 1. Received in formalin are tan, soft tissue fragments that are submitted in toto. Number: 1 Size: 0.4 cm, (1B) ( TA )        Report signed out from the following location(s) [facility] [address], [place] [address] CLIA #: [id number]  [facility] [address] [place], [place] [address] CLIA #: [id number]        08/03/2023   11:28 AM 06/08/2023   11:42 AM 05/08/2023    9:41 AM 04/10/2023    8:21 AM 12/13/2022    9:26 AM  Depression screen PHQ 2/9  Decreased Interest 1 0 1 1 1   Down, Depressed, Hopeless 1 0 1 1 1   PHQ - 2 Score 2 0 2 2 2   Altered sleeping 0 1 1 0 1  Tired, decreased energy 1 1 1 2 1   Change in appetite 0 0 0 1 0  Feeling bad or failure about yourself  0 0 1 1 0  Trouble concentrating 1 2 1 2 1   Moving slowly or fidgety/restless 1 1 1 1  0  Suicidal thoughts 0 0 1 0 0  PHQ-9 Score 5 5 8 9 5   Difficult doing work/chores  Not difficult at all Somewhat difficult Somewhat difficult Somewhat difficult       08/03/2023   11:29 AM 06/08/2023   11:42 AM 05/08/2023    9:41 AM 04/10/2023    8:22 AM  GAD 7 : Generalized Anxiety Score  Nervous, Anxious, on Edge 1 2 1 2   Control/stop worrying 1 1 1 1   Worry too  much - different things 1 1 1 1   Trouble relaxing 0 1 2 1   Restless 0 2 2 2   Easily annoyed or irritable 0 2 2 2   Afraid - awful might happen 1 1 1 1   Total GAD 7 Score 4 10 10 10   Anxiety Difficulty Not difficult at all Not difficult at all Somewhat difficult Somewhat difficult    Pertinent labs & imaging results that were available during my care of the patient were reviewed by me and considered in my medical decision making.  Assessment & Plan:  Melvin "Athena Bland" was seen today for right wrist swollen/pain.  Diagnoses and all orders for this visit:  Right wrist pain Suspect gout flare. Educated patient on gout  etiology and prevention. Will provide medication as below. Patient has gout at home but has not started taking. Provided instructions for patient to take colchicine.  -     methylPREDNISolone acetate (DEPO-MEDROL) injection 40 mg  Continue all other maintenance medications.  Follow up plan: Return if symptoms worsen or fail to improve.   Continue healthy lifestyle choices, including diet (rich in fruits, vegetables, and lean proteins, and low in salt and simple carbohydrates) and exercise (at least 30 minutes of moderate physical activity daily).  Written and verbal instructions provided   The above assessment and management plan was discussed with the patient. The patient verbalized understanding of and has agreed to the management plan. Patient is aware to call the clinic if they develop any new symptoms or if symptoms persist or worsen. Patient is aware when to return to the clinic for a follow-up visit. Patient educated on when it is appropriate to go to the emergency department.   Jacqualyn Mates, DNP-FNP [facility] [address] [place], [place] [address] [phone]

## 2023-08-10 ENCOUNTER — Encounter: Payer: BC Managed Care – PPO | Attending: Psychology | Admitting: Psychology

## 2023-08-10 DIAGNOSIS — R41840 Attention and concentration deficit: Secondary | ICD-10-CM | POA: Insufficient documentation

## 2023-08-10 DIAGNOSIS — F319 Bipolar disorder, unspecified: Secondary | ICD-10-CM | POA: Diagnosis not present

## 2023-08-10 DIAGNOSIS — R413 Other amnesia: Secondary | ICD-10-CM | POA: Diagnosis present

## 2023-08-10 DIAGNOSIS — F09 Unspecified mental disorder due to known physiological condition: Secondary | ICD-10-CM | POA: Insufficient documentation

## 2023-08-14 ENCOUNTER — Ambulatory Visit: Payer: Self-pay

## 2023-08-14 ENCOUNTER — Encounter: Payer: Self-pay | Admitting: Neurology

## 2023-08-14 NOTE — Telephone Encounter (Signed)
 Copied from CRM [id number]. Topic: Clinical - Red Word Triage >> Aug 14, 2023  2:25 PM Melvin Roberts wrote: Red Word that prompted transfer to Nurse Triage: Having some GI pains in stomach, walking slowly and unable to get out of car. Started within the last 2 days. May be something with this parkinson medication, carbidopa -levodopa  (SINEMET ) 10-100 MG tablet and donepezil  (ARICEPT ) 5 MG tablet, but possibly beginning to be confused. Looking to come in for an appt.   Chief Complaint: Abdominal Pain  Symptoms: Flatulence, Urgency  Frequency: Since yesterday  Pertinent Negatives: Patient denies fever   Disposition: [] ED /[] Urgent Care (no appt availability in office) / [x] Appointment(In office/virtual)/ []  [facility]/ [] Home Care/ [] Refused Recommended Disposition /[] [facility]/ []  Follow-up with PCP  Additional Notes: Spoke with wife for triage. Melvin Roberts is being triaged for abdominal pain, flatulence, and urgency since yesterday. The patient states he has an abdominal pain rating a 6 on a numeric pain scale. In office appointment made for tomorrow.    Reason for Disposition  [1] MODERATE pain (e.g., interferes with normal activities) AND [2] pain comes and goes (cramps) AND [3] present > 24 hours  (Exception: Pain with Vomiting or Diarrhea - see that Guideline.)  Answer Assessment - Initial Assessment Questions 1. LOCATION: "Where does it hurt?"      Lower Abdomen  2. RADIATION: "Does the pain shoot anywhere else?" (e.g., chest, back)     None  3. ONSET: "When did the pain begin?" (Minutes, hours or days ago)      Yesterday  4. SUDDEN: "Gradual or sudden onset?"     Sudden  5. PATTERN "Does the pain come and go, or is it constant?"    - If it comes and goes: "How long does it last?" "Do you have pain now?"     (Note: Comes and goes means the pain is intermittent. It goes away completely between bouts.)    - If constant: "Is it getting better, staying the same, or  getting worse?"      (Note: Constant means the pain never goes away completely; most serious pain is constant and gets worse.)      Constant  6. SEVERITY: "How bad is the pain?"  (e.g., Scale 1-10; mild, moderate, or severe)    - MILD (1-3): Doesn't interfere with normal activities, abdomen soft and not tender to touch.     - MODERATE (4-7): Interferes with normal activities or awakens from sleep, abdomen tender to touch.     - SEVERE (8-10): Excruciating pain, doubled over, unable to do any normal activities.       Moderate to Severe  7. RECURRENT SYMPTOM: "Have you ever had this type of stomach pain before?" If Yes, ask: "When was the last time?" and "What happened that time?"      No  8. CAUSE: "What do you think is causing the stomach pain?"     Unsure  9. RELIEVING/AGGRAVATING FACTORS: "What makes it better or worse?" (e.g., antacids, bending or twisting motion, bowel movement)     Unsure  10. OTHER SYMPTOMS: "Do you have any other symptoms?" (e.g., back pain, diarrhea, fever, urination pain, vomiting)       Flatulence, Nausea, Urgency  Protocols used: Abdominal Pain - Male-A-AH

## 2023-08-15 ENCOUNTER — Ambulatory Visit: Admitting: Family Medicine

## 2023-08-15 ENCOUNTER — Encounter: Payer: Self-pay | Admitting: Family Medicine

## 2023-08-15 VITALS — BP 124/76 | HR 80 | Temp 97.9°F | Ht 71.0 in | Wt 234.4 lb

## 2023-08-15 DIAGNOSIS — G20C Parkinsonism, unspecified: Secondary | ICD-10-CM

## 2023-08-15 DIAGNOSIS — R1013 Epigastric pain: Secondary | ICD-10-CM

## 2023-08-15 DIAGNOSIS — Z79899 Other long term (current) drug therapy: Secondary | ICD-10-CM

## 2023-08-15 DIAGNOSIS — K219 Gastro-esophageal reflux disease without esophagitis: Secondary | ICD-10-CM

## 2023-08-15 MED ORDER — FAMOTIDINE 20 MG PO TABS: 20.0000 mg | ORAL_TABLET | Freq: Two times a day (BID) | ORAL | 0 refills | Status: DC

## 2023-08-15 NOTE — Progress Notes (Signed)
 Subjective:  Patient ID: Melvin Roberts, male    DOB: 07/08/1960, 63 y.o.   MRN: [id number]  Patient Care Team: [name], Roberts as PCP - General (Family Medicine)   Chief Complaint:  Heartburn (X 2 days )   HPI: Melvin Roberts is a 63 y.o. male presenting on 08/15/2023 for Heartburn (X 2 days )    History of Present Illness   Melvin KNIERIM "Athena Bland" is a 63 year old male with Parkinson's disease and a hiatal hernia who presents with worsening acid reflux symptoms.  He has been experiencing significant belching that travels up his throat into his mouth, worsening over the past couple of months, and describes the symptoms as 'brutal' over the last few days. Protonix , which he has been taking, no longer seems effective. He has also tried Prilosec in the past and uses Pepto-Bismol occasionally, which provides some relief after several doses.  He mentions associated symptoms of nausea and diarrhea, but no vomiting. He also reports increased fatigue and weakness, noting that he has been unable to get out of his chair on some days. These symptoms occur in conjunction with his Parkinson's disease, which he feels exacerbates his condition when he is unwell.  He has a history of a hiatal hernia and notes that his symptoms worsen with certain foods, such as onions. He experiences burning pain with belching that lasts for a minute or two and recurs frequently.  He has been taking over-the-counter sinus and allergy medication, similar to Alka-Seltzer, and occasionally uses methocarbamol . He also mentions frequent urination and has been taking carbidopa -levodopa  for Parkinson's disease, which he feels may affect his gastric motility.  His bowel movements occur one to two times daily, but he experiences episodes of diarrhea, particularly after his recent colonoscopy, which he feels may have triggered his current symptoms.          Relevant past medical, surgical, family, and  social history reviewed and updated as indicated.  Allergies and medications reviewed and updated. Data reviewed: Chart in Epic.   Past Medical History:  Diagnosis Date   Allergy    Bipolar 1 disorder (HCC)    GERD (gastroesophageal reflux disease)    Hyperlipidemia    Hypertension     Past Surgical History:  Procedure Laterality Date   BACK SURGERY  11/17/1995   Dr. Leighton Punches   COLONOSCOPY     EYE SURGERY     HERNIA REPAIR  04/19/2007   umbilical   UPPER GASTROINTESTINAL ENDOSCOPY  x2   w/ esophageal dilation    Social History   Socioeconomic History   Marital status: Married    Spouse name: Not on file   Number of children: Not on file   Years of education: Not on file   Highest education level: Associate degree: occupational, Scientist, product/process development, or vocational program  Occupational History   Not on file  Tobacco Use   Smoking status: Never   Smokeless tobacco: Never  Vaping Use   Vaping status: Never Used  Substance and Sexual Activity   Alcohol use: Not Currently   Drug use: No   Sexual activity: Not on file  Other Topics Concern   Not on file  Social History Narrative   Not on file   Social Drivers of Health   Financial Resource Strain: Low Risk  (06/07/2023)   Overall Financial Resource Strain (CARDIA)    Difficulty of Paying Living Expenses: Not hard at all  Food Insecurity: No Food Insecurity (  06/07/2023)   Hunger Vital Sign    Worried About Running Out of Food in the Last Year: Never true    Ran Out of Food in the Last Year: Never true  Transportation Needs: No Transportation Needs (06/07/2023)   PRAPARE - Administrator, Civil Service (Medical): No    Lack of Transportation (Non-Medical): No  Physical Activity: Sufficiently Active (06/07/2023)   Exercise Vital Sign    Days of Exercise per Week: 3 days    Minutes of Exercise per Session: 50 min  Stress: No Stress Concern Present (06/07/2023)   [employer] of Occupational Health - Occupational  Stress Questionnaire    Feeling of Stress : Only a little  Social Connections: Socially Integrated (06/07/2023)   Social Connection and Isolation Panel [NHANES]    Frequency of Communication with Friends and Family: Twice a week    Frequency of Social Gatherings with Friends and Family: Once a week    Attends Religious Services: 1 to 4 times per year    Active Member of [employer] or Organizations: Yes    Attends Banker Meetings: 1 to 4 times per year    Marital Status: Married  Catering manager Violence: Not on file    Outpatient Encounter Medications as of 08/15/2023  Medication Sig   allopurinol  (ZYLOPRIM ) 300 MG tablet Take 1 tablet (300 mg total) by mouth daily.   carbidopa -levodopa  (SINEMET ) 10-100 MG tablet Take 2 tablets by mouth 3 (three) times daily. For parkinsonism   cholecalciferol (VITAMIN D ) 1000 UNITS tablet Take 1,000 Units by mouth daily.   colchicine  0.6 MG tablet Take twice daily for gout attack. (may take every two hours up to 6 doses at acute onset)   divalproex  (DEPAKOTE ) 500 MG DR tablet Take two tablets in the morning and take two tablets at bedtime.   donepezil  (ARICEPT ) 5 MG tablet TAKE 1 TABLET BY MOUTH EVERYDAY AT BEDTIME   famotidine (PEPCID) 20 MG tablet Take 1 tablet (20 mg total) by mouth 2 (two) times daily for 14 days.   methocarbamol  (ROBAXIN ) 500 MG tablet Take 1 tablet (500 mg total) by mouth 4 (four) times daily.   OLANZapine  (ZYPREXA ) 5 MG tablet Take 1 tablet (5 mg total) by mouth at bedtime.   pantoprazole  (PROTONIX ) 40 MG tablet Take 1 tablet (40 mg total) by mouth daily.   [DISCONTINUED] celecoxib (CELEBREX) 200 MG capsule Take 200 mg by mouth 2 (two) times daily as needed. (Patient not taking: Reported on 08/03/2023)   No facility-administered encounter medications on file as of 08/15/2023.    Allergies  Allergen Reactions   Lipitor [Atorvastatin] Other (See Comments)   Niacin Other (See Comments)    Hot flash    Pertinent ROS per  HPI, otherwise unremarkable      Objective:  BP 124/76   Pulse 80   Temp 97.9 F (36.6 C)   Ht 5\' 11"  (1.803 m)   Wt 234 lb 6.4 oz (106.3 kg)   SpO2 91%   BMI 32.69 kg/m    Wt Readings from Last 3 Encounters:  08/15/23 234 lb 6.4 oz (106.3 kg)  08/03/23 240 lb (108.9 kg)  07/24/23 243 lb (110.2 kg)    Physical Exam Vitals and nursing note reviewed.  Constitutional:      General: He is not in acute distress.    Appearance: Normal appearance. He is obese. He is not ill-appearing, toxic-appearing or diaphoretic.  HENT:     Head: Normocephalic and atraumatic.  Nose: Nose normal.     Mouth/Throat:     Mouth: Mucous membranes are moist.  Eyes:     Conjunctiva/sclera: Conjunctivae normal.     Pupils: Pupils are equal, round, and reactive to light.  Cardiovascular:     Rate and Rhythm: Normal rate and regular rhythm.     Heart sounds: Normal heart sounds. No murmur heard.    No friction rub. No gallop.  Abdominal:     General: There is no distension.     Palpations: Abdomen is soft.     Tenderness: There is no abdominal tenderness.  Musculoskeletal:     Cervical back: Neck supple.  Skin:    General: Skin is warm and dry.     Capillary Refill: Capillary refill takes less than 2 seconds.  Neurological:     General: No focal deficit present.     Mental Status: He is alert and oriented to person, place, and time.     Motor: Tremor present.     Gait: Gait abnormal (slow, shuffling).  Psychiatric:        Mood and Affect: Mood normal.        Behavior: Behavior normal.        Thought Content: Thought content normal.        Judgment: Judgment normal.    EKG: SR 71, PR 158 ms, QT 372 ms, no acute ST-T elevation or ectopy. No significant changes from prior EKG. Kattie Parrot, FNP-C.    Results for orders placed or performed in visit on 07/18/23  Surgical pathology ([facility])   Collection Time: 07/18/23 12:00 AM  Result Value Ref Range   SURGICAL PATHOLOGY       SURGICAL PATHOLOGY [facility] [address] [place], [place] [address] Telephone [phone] or [phone] Fax [phone]  REPORT OF SURGICAL PATHOLOGY   Accession #: [id number] Patient Name: Melvin Roberts, Melvin Roberts Visit # : [id number]  MRN: [id number] Physician: Melvin Roberts DOB/Age 03/23/1961 (Age: 86) Gender: M Collected Date: 07/18/2023 Received Date: 07/19/2023  FINAL DIAGNOSIS       1. Surgical [P], colon, sigmoid, polyp (1) :       TUBULAR ADENOMA      NEGATIVE FOR HIGH-GRADE DYSPLASIA AND CARCINOMA       DATE SIGNED OUT: 07/20/2023 ELECTRONIC SIGNATURE : Melvin Roberts, Melvin Roberts , Sports administrator, Electronic Signature  MICROSCOPIC DESCRIPTION  CASE COMMENTS STAINS USED IN DIAGNOSIS: H&E    CLINICAL HISTORY  SPECIMEN(S) OBTAINED 1. Surgical [P], Colon, Sigmoid, Polyp (1)  SPECIMEN COMMENTS: 1. Special screening for malignant neoplasms, colon; benign neoplasm of sigmoid colon SPECIMEN CLINICAL INFORMATION: 1 . R/O adenoma    Gross Description 1. Received in formalin are tan, soft tissue fragments that are submitted in toto. Number: 1 Size: 0.4 cm, (1B) ( TA )        Report signed out from the following location(s) [place] [address], [place] [address] CLIA #: [id number]  [facility] [address] [place], [place] [address] CLIA #: [id number]        Pertinent labs & imaging results that were available during my care of the patient were reviewed by me and considered in my medical decision making.  Assessment & Plan:  Melvin "Athena Bland" was seen today for heartburn.  Diagnoses and all orders for this visit:  Epigastric pain -     EKG 12-Lead  Gastroesophageal reflux disease without esophagitis -  famotidine (PEPCID) 20 MG tablet; Take 1 tablet (20 mg total) by mouth 2 (two) times daily for 14 days.  Parkinsonism, unspecified Parkinsonism type (HCC) -      EKG 12-Lead  High risk medication use -     EKG 12-Lead     Assessment and Plan    Gastroesophageal reflux disease (GERD) Worsening GERD symptoms over the last few months, with severe episodes in the last few days, including significant belching, burning sensation, and nausea. Protonix  is no longer effective. Differential diagnosis includes cardiac issues due to nausea and fatigue, but EKG shows no acute changes. Hiatal hernia and medication side effects may contribute to symptoms. Discussed potential cardiac issues mimicking GERD symptoms and the importance of ruling out cardiac causes. - Perform EKG to rule out cardiac issues. - Start Pepcid twice daily for two weeks in addition to Protonix . - Allow use of Rolaids, Tums, or Pepto for breakthrough symptoms. - Reassess symptoms in two weeks and adjust treatment as needed.  Hiatal hernia Hiatal hernia may exacerbate GERD symptoms, especially with overeating. Small, frequent meals recommended to manage symptoms. Discussed the impact of hiatal hernia on GERD and the importance of dietary management.  Parkinson's disease Parkinson's disease may contribute to fatigue and decreased gastric motility, complicating GERD management. Current medication regimen includes carbidopa /levodopa  and Zyprexa , which may slow GI motility. Discussed the potential impact of Parkinson's medications on gastric motility and the possibility of adjusting carbidopa /levodopa  dosage if no improvement in symptoms. - Consider adjusting carbidopa /levodopa  dosage if no improvement in symptoms.          Continue all other maintenance medications.  Follow up plan: Return in about 2 weeks (around 08/29/2023), or if symptoms worsen or fail to improve, for GERD.   Continue healthy lifestyle choices, including diet (rich in fruits, vegetables, and lean proteins, and low in salt and simple carbohydrates) and exercise (at least 30 minutes of moderate physical activity  daily).  Educational handout given for GERD  The above assessment and management plan was discussed with the patient. The patient verbalized understanding of and has agreed to the management plan. Patient is aware to call the clinic if they develop any new symptoms or if symptoms persist or worsen. Patient is aware when to return to the clinic for a follow-up visit. Patient educated on when it is appropriate to go to the emergency department.   Kattie Parrot, FNP-C [facility] [phone]

## 2023-08-21 ENCOUNTER — Ambulatory Visit: Payer: 59 | Admitting: Family Medicine

## 2023-08-21 ENCOUNTER — Encounter: Payer: Self-pay | Admitting: Family Medicine

## 2023-08-21 VITALS — BP 127/86 | HR 53 | Temp 98.2°F | Ht 71.0 in | Wt 239.0 lb

## 2023-08-21 DIAGNOSIS — R41840 Attention and concentration deficit: Secondary | ICD-10-CM | POA: Diagnosis not present

## 2023-08-21 DIAGNOSIS — M10271 Drug-induced gout, right ankle and foot: Secondary | ICD-10-CM | POA: Diagnosis not present

## 2023-08-21 DIAGNOSIS — N401 Enlarged prostate with lower urinary tract symptoms: Secondary | ICD-10-CM | POA: Diagnosis not present

## 2023-08-21 DIAGNOSIS — K2100 Gastro-esophageal reflux disease with esophagitis, without bleeding: Secondary | ICD-10-CM

## 2023-08-21 DIAGNOSIS — M549 Dorsalgia, unspecified: Secondary | ICD-10-CM

## 2023-08-21 DIAGNOSIS — M5430 Sciatica, unspecified side: Secondary | ICD-10-CM

## 2023-08-21 DIAGNOSIS — R350 Frequency of micturition: Secondary | ICD-10-CM

## 2023-08-21 MED ORDER — DONEPEZIL HCL 5 MG PO TABS: 5.0000 mg | ORAL_TABLET | Freq: Every day | ORAL | 0 refills | Status: DC

## 2023-08-21 MED ORDER — FAMOTIDINE 20 MG PO TABS: 20.0000 mg | ORAL_TABLET | Freq: Two times a day (BID) | ORAL | 3 refills | Status: AC

## 2023-08-21 MED ORDER — PANTOPRAZOLE SODIUM 40 MG PO TBEC: 40.0000 mg | DELAYED_RELEASE_TABLET | Freq: Every day | ORAL | 3 refills | Status: AC

## 2023-08-21 MED ORDER — ALLOPURINOL 300 MG PO TABS: 300.0000 mg | ORAL_TABLET | Freq: Every day | ORAL | 1 refills | Status: DC

## 2023-08-21 MED ORDER — METHOCARBAMOL 500 MG PO TABS
500.0000 mg | ORAL_TABLET | Freq: Four times a day (QID) | ORAL | 1 refills | Status: DC
Start: 2023-08-21 — End: 2023-10-26

## 2023-08-21 MED ORDER — COLCHICINE 0.6 MG PO TABS: ORAL_TABLET | ORAL | 2 refills | Status: DC

## 2023-08-21 MED ORDER — CARBIDOPA-LEVODOPA ER 50-200 MG PO TBCR: 1.0000 | EXTENDED_RELEASE_TABLET | Freq: Three times a day (TID) | ORAL | 1 refills | Status: DC

## 2023-08-21 MED ORDER — TAMSULOSIN HCL 0.4 MG PO CAPS: 0.8000 mg | ORAL_CAPSULE | Freq: Every day | ORAL | 3 refills | Status: AC

## 2023-08-21 NOTE — Progress Notes (Signed)
 Subjective:  Patient ID: Melvin Roberts, male    DOB: February 26, 1961  Age: 63 y.o. MRN: [id number]  CC: Medical Management of Chronic Issues (Pepcid  helping taking twice daily), cramps (Cramps in hands and feet), and Medication Reaction (Levodopa  can we back off?)   HPI [name] presents for continued tremor with the hands and cramping pain in the legs.  He is walking more fluidly.  He is not shuffling. He saw [name] last month and has been taking Pepcid  with quite a bit of help with GI distress.    08/21/2023   10:26 AM 08/03/2023   11:28 AM 06/08/2023   11:42 AM  Depression screen PHQ 2/9  Decreased Interest 1 1 0  Down, Depressed, Hopeless 0 1 0  PHQ - 2 Score 1 2 0  Altered sleeping 1 0 1  Tired, decreased energy 1 1 1   Change in appetite 1 0 0  Feeling bad or failure about yourself  0 0 0  Trouble concentrating 1 1 2   Moving slowly or fidgety/restless 1 1 1   Suicidal thoughts 0 0 0  PHQ-9 Score 6 5 5   Difficult doing work/chores Not difficult at all  Not difficult at all    History [name] has a past medical history of Allergy, Bipolar 1 disorder (HCC), GERD (gastroesophageal reflux disease), Hyperlipidemia, and Hypertension.   He has a past surgical history that includes Back surgery (11/17/1995); Hernia repair (04/19/2007); Eye surgery; Upper gastrointestinal endoscopy (x2); and Colonoscopy.   His family history includes Cancer in his father, maternal grandfather, mother, and paternal grandfather; Esophageal cancer in his maternal grandfather.He reports that he has never smoked. He has never used smokeless tobacco. He reports that he does not currently use alcohol. He reports that he does not use drugs.    ROS Review of Systems  Constitutional:  Negative for fever.  Respiratory:  Negative for shortness of breath.   Cardiovascular:  Negative for chest pain.  Musculoskeletal:  Positive for myalgias (Leg cramps). Negative for arthralgias.  Skin:   Negative for rash.  Neurological:  Positive for tremors (Primarily in the hands).    Objective:  BP 127/86   Pulse (!) 53   Temp 98.2 F (36.8 C)   Ht 5\' 11"  (1.803 m)   Wt 239 lb (108.4 kg)   SpO2 97%   BMI 33.33 kg/m   BP Readings from Last 3 Encounters:  08/21/23 127/86  08/15/23 124/76  08/03/23 127/81    Wt Readings from Last 3 Encounters:  08/21/23 239 lb (108.4 kg)  08/15/23 234 lb 6.4 oz (106.3 kg)  08/03/23 240 lb (108.9 kg)     Physical Exam Vitals reviewed.  Constitutional:      Appearance: He is well-developed.  HENT:     Head: Normocephalic and atraumatic.     Right Ear: External ear normal.     Left Ear: External ear normal.     Mouth/Throat:     Pharynx: No oropharyngeal exudate or posterior oropharyngeal erythema.  Eyes:     Pupils: Pupils are equal, round, and reactive to light.  Cardiovascular:     Rate and Rhythm: Normal rate and regular rhythm.     Heart sounds: No murmur heard. Pulmonary:     Effort: No respiratory distress.     Breath sounds: Normal breath sounds.  Musculoskeletal:     Cervical back: Normal range of motion and neck supple.  Skin:    General: Skin is warm and dry.  Neurological:     Mental Status: He is alert and oriented to person, place, and time.     Coordination: Coordination normal (Tremor in the hands is not apparent at this time.  The legs appear normal).      Assessment & Plan:  Benign prostatic hyperplasia with urinary frequency  Acute drug-induced gout involving toe of right foot -     Colchicine ; Take twice daily for gout attack. (may take every two hours up to 6 doses at acute onset)  Dispense: 60 tablet; Refill: 2 -     Allopurinol ; Take 1 tablet (300 mg total) by mouth daily.  Dispense: 90 tablet; Refill: 1  Cognitive attention deficit -     Donepezil  HCl; Take 1 tablet (5 mg total) by mouth at bedtime.  Dispense: 90 tablet; Refill: 0  Back pain with sciatica -     Methocarbamol ; Take 1 tablet (500  mg total) by mouth 4 (four) times daily.  Dispense: 120 tablet; Refill: 1  Gastroesophageal reflux disease with esophagitis without hemorrhage -     Famotidine ; Take 1 tablet (20 mg total) by mouth 2 (two) times daily.  Dispense: 180 tablet; Refill: 3 -     Pantoprazole  Sodium; Take 1 tablet (40 mg total) by mouth daily.  Dispense: 90 tablet; Refill: 3  Other orders -     Tamsulosin HCl; Take 2 capsules (0.8 mg total) by mouth at bedtime. For urine flow and prostate  Dispense: 180 capsule; Refill: 3 -     Carbidopa -Levodopa  ER; Take 1 tablet by mouth 3 (three) times daily.  Dispense: 270 tablet; Refill: 1     Follow-up: Return in about 3 months (around 11/21/2023).  [name], M.D.

## 2023-08-29 ENCOUNTER — Encounter: Payer: Self-pay | Admitting: Family Medicine

## 2023-08-29 ENCOUNTER — Telehealth: Payer: Self-pay

## 2023-08-29 NOTE — Telephone Encounter (Unsigned)
 Copied from CRM [id number]. Topic: General - Other >> Aug 29, 2023  2:21 PM Blair Bumpers wrote: Reason for CRM: [place], case manager, with [facility] in [place]  called in stating that patient was visiting his mother in [place] . He came in with an unsteady gait in the ED and will be getting discharged and headed back to [place]  tomorrow. Turkey stated that Physical Therapy there recommended patient to look for visiting nurses. She stated she wanted to call and let Dr. Clotilda Danish know. For further questions and concerns, please give [place] a call. CB #: [phone].

## 2023-08-29 NOTE — Telephone Encounter (Signed)
 Set him for my first available appointment for next week.

## 2023-08-30 NOTE — Telephone Encounter (Signed)
 Tried to contact pt on both numbers listed to make a apt , left vm on home , cell no vm set up   Please make pt a apt with pcp next week if returns call

## 2023-08-31 ENCOUNTER — Ambulatory Visit: Admitting: Family Medicine

## 2023-08-31 NOTE — Telephone Encounter (Signed)
Pt already scheduled for next week

## 2023-09-04 ENCOUNTER — Telehealth: Payer: Self-pay | Admitting: Adult Health

## 2023-09-04 ENCOUNTER — Encounter: Attending: Psychology

## 2023-09-04 DIAGNOSIS — R251 Tremor, unspecified: Secondary | ICD-10-CM | POA: Insufficient documentation

## 2023-09-04 DIAGNOSIS — F319 Bipolar disorder, unspecified: Secondary | ICD-10-CM | POA: Diagnosis not present

## 2023-09-04 DIAGNOSIS — F09 Unspecified mental disorder due to known physiological condition: Secondary | ICD-10-CM | POA: Insufficient documentation

## 2023-09-04 NOTE — Telephone Encounter (Signed)
 This has been addressed in a different message

## 2023-09-04 NOTE — Telephone Encounter (Signed)
 Addendum to attached message on 09/04/23. Melvin Roberts is in the hospital.His wife wanted us  to be aware of it. There are concerns that Olanzapine  may interfere with his Parkinson's disease meds.

## 2023-09-04 NOTE — Telephone Encounter (Signed)
 Pt has an appt on 5/21. He was recently in the hospital in [place] for rotavirus.  Wife said there is some concern that olanzapine  may interfere/interact with his Parkinson's meds or cause psychosis.  There has been decline in gait. Wife reporting that he is sleeping fine now, but every day is a different day.

## 2023-09-04 NOTE — Telephone Encounter (Signed)
 Wife [name] called and LM at 3:34 stating that Melvin Roberts had ended up in the hospital when visit family in [place].  It seems the Olanzapine  doesn't work well with his parkinson meds.  She requested a call back to discuss stopping the medication and perhaps finding something that will work better with his others meds.  [phone]

## 2023-09-05 ENCOUNTER — Ambulatory Visit: Admitting: Family Medicine

## 2023-09-05 ENCOUNTER — Encounter: Payer: Self-pay | Admitting: Family Medicine

## 2023-09-05 VITALS — BP 126/81 | HR 62 | Temp 98.2°F | Ht 71.0 in | Wt 242.0 lb

## 2023-09-05 DIAGNOSIS — N2889 Other specified disorders of kidney and ureter: Secondary | ICD-10-CM

## 2023-09-05 DIAGNOSIS — R600 Localized edema: Secondary | ICD-10-CM | POA: Diagnosis not present

## 2023-09-05 DIAGNOSIS — G20C Parkinsonism, unspecified: Secondary | ICD-10-CM | POA: Diagnosis not present

## 2023-09-05 DIAGNOSIS — N281 Cyst of kidney, acquired: Secondary | ICD-10-CM | POA: Diagnosis not present

## 2023-09-05 MED ORDER — FUROSEMIDE 20 MG PO TABS: 20.0000 mg | ORAL_TABLET | Freq: Every day | ORAL | 3 refills | Status: DC

## 2023-09-05 MED ORDER — POTASSIUM CHLORIDE CRYS ER 10 MEQ PO TBCR: 10.0000 meq | EXTENDED_RELEASE_TABLET | Freq: Every day | ORAL | 3 refills | Status: DC

## 2023-09-05 NOTE — Progress Notes (Signed)
 Behavioral Observations:  The patient appeared well-groomed and appropriately dressed. His manners were polite and appropriate to the situation. He ambulated using a walker and utilized hearing aids for the duration of testing. His attitude towards testing was positive and his effort was good.  Neuropsychology Note  SHAYN MADOLE completed 200 minutes of neuropsychological testing with technician, Rhett Cella, BA, under the supervision of Chapman Commodore, PsyD., Clinical Neuropsychologist. The patient did not appear overtly distressed by the testing session, per behavioral observation or via self-report to the technician. Rest breaks were offered.   Clinical Decision Making: In considering the patient's current level of functioning, level of presumed impairment, nature of symptoms, emotional and behavioral responses during clinical interview, level of literacy, and observed level of motivation/effort, a battery of tests was selected by Dr. Cheryll Corti during initial consultation on 08/10/2023. This was communicated to the technician. Communication between the neuropsychologist and technician was ongoing throughout the testing session and changes were made as deemed necessary based on patient performance on testing, technician observations and additional pertinent factors such as those listed above.  Tests Administered: Controlled Oral Word Association Test (COWAT; FAS & Animals)  Finger Tapping Test (FTT) Grooved Pegboard Wechsler Adult Intelligence Scale, 4th Edition (WAIS-IV) Wechsler Memory Scale, 4th Edition (WMS-IV); Adult Battery  [place]  Card Sorting Test [facility])  Results:  COWAT:  FAS= 25 Z= -0.99 Animals= 25 Z= 1.57  FTT: R (DH) Average= 22 Percentile Rank=15th L (NDH) Average=25.8 Percentile Rank=15th  Grooved Pegboard:  *Tremor observed on both hands, more prominent on R (DH)  R (DH) time= 266s Percentile Rank= 15th Drops= 3  L (NDH) time=  171s Percentile Rank= 27th  Drops= 3  WAIS-IV:  Composite Score Summary  Scale Sum of Scaled Scores Composite Score Percentile Rank 95% Conf. Interval Qualitative Description  Verbal Comprehension 33 VCI 105 63 99-110 Average  Perceptual Reasoning 18 PRI 77 6 72-84 Borderline  Working Memory 14 WMI 83 13 77-91 Low Average  Processing Speed 9 PSI 71 3 66-82 Borderline  Full Scale 74 FSIQ 82 12 78-86 Low Average  General Ability 51 GAI 91 27 86-96 Average   Verbal Comprehension Subtests Summary  Subtest Raw Score Scaled Score Percentile Rank Reference Group Scaled Score SEM  Similarities 27 11 63 11 1.08  Vocabulary 40 10 50 11 0.73  Information 19 12 75 14 0.67  The scaled scores in the Reference Group Scaled Score column are based on the performance of examinees aged 20:0-34:11 (i.e., the reference group). See Chapter 6 of the WAIS-IV Technical and Interpretive Manual for more information.  Perceptual Reasoning Subtests Summary  Subtest Raw Score Scaled Score Percentile Rank Reference Group Scaled Score SEM  Block Design 20 6 9 5  1.04  Matrix Reasoning 6 5 5 3  0.95  Visual Puzzles 8 7 16 6  0.99   Working Librarian, academic Raw Score Scaled Score Percentile Rank Reference Group Scaled Score SEM  Digit Span 20 7 16 6  0.85  Arithmetic 10 7 16 7  1.04   Processing Speed Subtests Summary  Subtest Raw Score Scaled Score Percentile Rank Reference Group Scaled Score SEM  Symbol Search 19 6 9 5  1.31  Coding 25 3 1 2  0.99    WMS-IV:  Index Score Summary  Index Sum of Scaled Scores Index Score Percentile Rank 95% Confidence Interval Qualitative Descriptor  Auditory Memory (AMI) 27 81 10 76-88 Low Average  Visual Memory (VMI) 20 69 2 65-76 Extremely Low  Visual Working Civil Service fast streamer (  VWMI) 9 67 1 62-77 Extremely Low  Immediate Memory (IMI) 21 69 2 64-77 Extremely Low  Delayed Memory (DMI) 26 76 5 71-84 Borderline    Primary Subtest Scaled Score Summary  Subtest Domain  Raw Score Scaled Score Percentile Rank  Logical Memory I AM 21 8 25   Logical Memory II AM 15 7 16   Verbal Paired Associates I AM 11 5 5   Verbal Paired Associates II AM 6 7 16   Designs I VM 36 2 0.4  Designs II VM 36 6 9  Visual Reproduction I VM 26 6 9   Visual Reproduction II VM 8 6 9   Spatial Addition VWM 7 7 16   Symbol Span VWM 3 2 0.4   Auditory Memory Process Score Summary  Process Score Raw Score Scaled Score Percentile Rank Cumulative Percentage (Base Rate)  LM II Recognition 24 - - 26-50%  VPA II Recognition 37 - - 26-50%   Visual Memory Process Score Summary  Process Score Raw Score Scaled Score Percentile Rank Cumulative Percentage (Base Rate)  DE I Content 22 4 2  -  DE I Spatial 8 3 1  -  DE II Content 26 7 16  -  DE II Spatial 10 9 37 -  DE II Recognition 14 - - 26-50%  VR II Recognition 5 - - 26-50%   ABILITY-MEMORY ANALYSIS  Ability Score:  VCI: 105 Date of Testing:  WAIS-IV; WMS-IV 2023/09/04  Predicted Difference Method   Index Predicted WMS-IV Index Score Actual WMS-IV Index Score Difference Critical Value  Significant Difference Y/N Base Rate  Auditory Memory 103 81 22 9.00 Y 5%  Visual Memory 102 69 33 8.38 Y <1%  Visual Working Memory 103 67 36 10.86 Y <1%  Immediate Memory 103 69 34 10.12 Y <1%  Delayed Memory 103 76 27 9.95 Y 2%  Statistical significance (critical value) at the .01 level.    WCST:  [place]  Card Sorting Test (WCST)           Total Errors   t = 31 3 %ile Below Average   Perseverative Errors   t = 35 7 %ile Below Average   Non-Perseverative Errors   t = 31 3 %ile Below Average   % Conceptual Responses   t = 32 4 %ile Below Average   Categories Completed      6 to 10 %ile Low to Below Average   Trials to First Category Complete      >16 %ile WNL   Failure to Maintain Set      >16 %ile WNL     Feedback to Patient: KILLIAN SCHWER will return on 02/15/2024 for an interactive feedback session with Dr. Cheryll Corti at  which time his test performances, clinical impressions and treatment recommendations will be reviewed in detail. The patient understands he can contact our office should he require our assistance before this time.  200 minutes spent face-to-face with patient administering standardized tests, 30 minutes spent scoring Radiographer, therapeutic). [CPT [id number], [id number]]  Full report to follow.

## 2023-09-05 NOTE — Progress Notes (Signed)
 Subjective:  Patient ID: Melvin Roberts, male    DOB: 06-14-1960  Age: 63 y.o. MRN: [id number]  CC: Hospitalization Follow-up (Pt reports that he is doing better. Fluid on legs and feet. Rotavirus cleared up. No more vomiting and diarrhea. Gait was off and that why he originally went there for. Gait a little better but still using walker. Pt did fall while at hospital. Frequent headaches and back aches since hospital stay.) and Referral (Renal cyst that is specious and needs referral.)   HPI Melvin Roberts presents for hospital follow up.  He was visiting his mother for Mother's Day just outside [place] .  Couldn't get his feet moving. Then started having diarrhea and vomiting. Had thick dark emesis. On 4/1 he had had some abd pain. Responded to pepto and imodium. Plus Pepcid .  Additionally he seemed to not digest the controlled release carbidopa /levodopa  as his symptoms from Parkinson's rebounded while hospitalized.  He has been placed on immediate release with the equivalent dose.  There was an ultrasound done of the abdomen which showed a right renal mass approximately an inch in diameter.  There were also multiple cysts.  Request was made that this be followed up here.  The most visible concern [name] has currently is for the swelling in his legs.  He is also having some cramping in the legs.  This actually started prior to the above hospitalization.     09/05/2023    3:31 PM 08/21/2023   10:26 AM 08/03/2023   11:28 AM  Depression screen PHQ 2/9  Decreased Interest 1 1 1   Down, Depressed, Hopeless 1 0 1  PHQ - 2 Score 2 1 2   Altered sleeping 0 1 0  Tired, decreased energy 1 1 1   Change in appetite 0 1 0  Feeling bad or failure about yourself  1 0 0  Trouble concentrating 1 1 1   Moving slowly or fidgety/restless 1 1 1   Suicidal thoughts 0 0 0  PHQ-9 Score 6 6 5   Difficult doing work/chores Not difficult at all Not difficult at all     History Rene has  a past medical history of Allergy, Bipolar 1 disorder (HCC), GERD (gastroesophageal reflux disease), Hyperlipidemia, and Hypertension.   He has a past surgical history that includes Back surgery (11/17/1995); Hernia repair (04/19/2007); Eye surgery; Upper gastrointestinal endoscopy (x2); and Colonoscopy.   His family history includes Cancer in his father, maternal grandfather, mother, and paternal grandfather; Esophageal cancer in his maternal grandfather.He reports that he has never smoked. He has never used smokeless tobacco. He reports that he does not currently use alcohol. He reports that he does not use drugs.    ROS Review of Systems  Constitutional: Negative.   HENT: Negative.    Eyes:  Negative for visual disturbance.  Respiratory:  Negative for cough and shortness of breath.   Cardiovascular:  Positive for leg swelling. Negative for chest pain.  Gastrointestinal:  Positive for abdominal distention and abdominal pain. Negative for diarrhea, nausea and vomiting.  Genitourinary:  Negative for difficulty urinating.  Musculoskeletal:  Positive for gait problem. Negative for arthralgias and myalgias.  Skin:  Negative for rash.  Neurological:  Negative for headaches.  Psychiatric/Behavioral:  Negative for sleep disturbance.     Objective:  BP 126/81   Pulse 62   Temp 98.2 F (36.8 C)   Ht 5\' 11"  (1.803 m)   Wt 242 lb (109.8 kg)   SpO2 93%   BMI 33.75 kg/m  BP Readings from Last 3 Encounters:  09/05/23 126/81  08/21/23 127/86  08/15/23 124/76    Wt Readings from Last 3 Encounters:  09/05/23 242 lb (109.8 kg)  08/21/23 239 lb (108.4 kg)  08/15/23 234 lb 6.4 oz (106.3 kg)     Physical Exam Constitutional:      General: He is not in acute distress.    Appearance: He is well-developed.  HENT:     Head: Normocephalic and atraumatic.     Right Ear: External ear normal.     Left Ear: External ear normal.     Nose: Nose normal.  Eyes:     Conjunctiva/sclera:  Conjunctivae normal.     Pupils: Pupils are equal, round, and reactive to light.  Cardiovascular:     Rate and Rhythm: Normal rate and regular rhythm.     Heart sounds: Normal heart sounds. No murmur heard. Pulmonary:     Effort: Pulmonary effort is normal. No respiratory distress.     Breath sounds: Normal breath sounds. No wheezing or rales.  Abdominal:     Palpations: Abdomen is soft.     Tenderness: There is no abdominal tenderness.  Musculoskeletal:        General: Normal range of motion.     Cervical back: Normal range of motion and neck supple.  Skin:    General: Skin is warm and dry.  Neurological:     Mental Status: He is alert and oriented to person, place, and time.     Deep Tendon Reflexes: Reflexes are normal and symmetric.  Psychiatric:        Behavior: Behavior normal.        Thought Content: Thought content normal.        Judgment: Judgment normal.      Assessment & Plan:  Acquired complex renal cyst -     CT ABDOMEN PELVIS W CONTRAST; Future  Leg edema -     CMP14+EGFR -     CBC with Differential/Platelet -     CT ABDOMEN PELVIS W CONTRAST; Future  Renal mass, right -     CT ABDOMEN PELVIS W CONTRAST; Future  Parkinsonism, unspecified Parkinsonism type (HCC) -     Ambulatory referral to Physical Therapy  Other orders -     Furosemide; Take 1 tablet (20 mg total) by mouth daily. For swelling  Dispense: 90 tablet; Refill: 3 -     Potassium Chloride Crys ER; Take 1 tablet (10 mEq total) by mouth daily. For potassium replacement/ supplement  Dispense: 90 tablet; Refill: 3  45 minutes was spent with the patient.  Over one half was spent discussing the patient's recent hospitalization and appropriate follow-up for that as well as his renal cyst and mass and exacerbation of his parkinsonism.   Follow-up: Return in about 2 weeks (around 09/19/2023).  [name], M.D.

## 2023-09-06 ENCOUNTER — Telehealth (INDEPENDENT_AMBULATORY_CARE_PROVIDER_SITE_OTHER): Admitting: Adult Health

## 2023-09-06 ENCOUNTER — Encounter: Payer: Self-pay | Admitting: Adult Health

## 2023-09-06 DIAGNOSIS — F411 Generalized anxiety disorder: Secondary | ICD-10-CM

## 2023-09-06 DIAGNOSIS — F319 Bipolar disorder, unspecified: Secondary | ICD-10-CM

## 2023-09-06 DIAGNOSIS — G4700 Insomnia, unspecified: Secondary | ICD-10-CM

## 2023-09-06 LAB — CMP14+EGFR
ALT: 9 IU/L (ref 0–44)
AST: 21 IU/L (ref 0–40)
Albumin: 3.5 g/dL — ABNORMAL LOW (ref 3.9–4.9)
Alkaline Phosphatase: 71 IU/L (ref 44–121)
BUN/Creatinine Ratio: 8 — ABNORMAL LOW (ref 10–24)
BUN: 10 mg/dL (ref 8–27)
Bilirubin Total: 0.3 mg/dL (ref 0.0–1.2)
CO2: 22 mmol/L (ref 20–29)
Calcium: 9.2 mg/dL (ref 8.6–10.2)
Chloride: 109 mmol/L — ABNORMAL HIGH (ref 96–106)
Creatinine, Ser: 1.22 mg/dL (ref 0.76–1.27)
Globulin, Total: 2.7 g/dL (ref 1.5–4.5)
Glucose: 96 mg/dL (ref 70–99)
Potassium: 3.9 mmol/L (ref 3.5–5.2)
Sodium: 143 mmol/L (ref 134–144)
Total Protein: 6.2 g/dL (ref 6.0–8.5)
eGFR: 67 mL/min/{1.73_m2} (ref >59)

## 2023-09-06 LAB — CBC WITH DIFFERENTIAL/PLATELET
Basophils Absolute: 0 10*3/uL (ref 0.0–0.2)
Basos: 0 %
EOS (ABSOLUTE): 0.1 10*3/uL (ref 0.0–0.4)
Eos: 1 %
Hematocrit: 34.4 % — ABNORMAL LOW (ref 37.5–51.0)
Hemoglobin: 11.2 g/dL — ABNORMAL LOW (ref 13.0–17.7)
Immature Grans (Abs): 0.2 10*3/uL — ABNORMAL HIGH (ref 0.0–0.1)
Immature Granulocytes: 3 %
Lymphocytes Absolute: 1.6 10*3/uL (ref 0.7–3.1)
Lymphs: 25 %
MCH: 30.7 pg (ref 26.6–33.0)
MCHC: 32.6 g/dL (ref 31.5–35.7)
MCV: 94 fL (ref 79–97)
Monocytes Absolute: 0.5 10*3/uL (ref 0.1–0.9)
Monocytes: 8 %
Neutrophils Absolute: 4 10*3/uL (ref 1.4–7.0)
Neutrophils: 63 %
Platelets: 194 10*3/uL (ref 150–450)
RBC: 3.65 x10E6/uL — ABNORMAL LOW (ref 4.14–5.80)
RDW: 15.1 % (ref 11.6–15.4)
WBC: 6.3 10*3/uL (ref 3.4–10.8)

## 2023-09-06 NOTE — Progress Notes (Signed)
 Melvin Roberts [id number] [birth year] 63 y.o.  Virtual Visit via Video Note  I connected with pt @ on 09/06/23 at 10:00 AM EDT by a video enabled telemedicine application and verified that I am speaking with the correct person using two identifiers.   I discussed the limitations of evaluation and management by telemedicine and the availability of in person appointments. The patient expressed understanding and agreed to proceed.  I discussed the assessment and treatment plan with the patient. The patient was provided an opportunity to ask questions and all were answered. The patient agreed with the plan and demonstrated an understanding of the instructions.   The patient was advised to call back or seek an in-person evaluation if the symptoms worsen or if the condition fails to improve as anticipated.  I provided 25 minutes of non-face-to-face time during this encounter.  The patient was located at home.  The provider was located at [facility].   Reagan Camera, NP   Subjective:   Patient ID:  Melvin Roberts is a 63 y.o. (DOB December 22, 1960) male.  Chief Complaint: No chief complaint on file.   HPI Melvin Roberts presents for follow-up of Bipolar Disorder, GAD and insomnia.  Accompanied by wife.  Describes mood today as "ok". Mood symptoms - reports a "little" depression. Denies anxiety. Reports irritability at times - "feeling aggravated". Reports stable interest and motivation. Denies panic attacks. Denies some over thinking. Denies worry and rumination. Reports reports recent hospitalization in [place] - now recovering at home. Reports mood is stable. Stating "I feel like I'm doing better now that I'm back home". Taking medications as prescribed. Energy levels lower. Active, does not have a regular exercise routine. Enjoys some usual interests and activities. Married. Lives with wife. Has 5 children and 11 grandchildren. Spending time with family. Appetite  adequate. Weight 242 pounds. Reports sleep is variable - neck and back pain. Averages 5 to 6 hours or more. Reports napping a few times a week. Reports focus and concentration difficulties - "it's not too bad". Managing minimal aspects of household. Disabled. Denies SI or HI.  Denies AH or VH. Denies self harm. Denies substance use. Reports hearing loss.  Previous medication trials: Lithium , Seroquel   Review of Systems:  Review of Systems  Musculoskeletal:  Negative for gait problem.  Neurological:  Negative for tremors.  Psychiatric/Behavioral:         Please refer to HPI    Medications: I have reviewed the patient's current medications.  Current Outpatient Medications  Medication Sig Dispense Refill   allopurinol  (ZYLOPRIM ) 300 MG tablet Take 1 tablet (300 mg total) by mouth daily. 90 tablet 1   carbidopa -levodopa  (SINEMET  IR) 25-100 MG tablet Take 2 tablets by mouth 3 (three) times daily.     cholecalciferol (VITAMIN D ) 1000 UNITS tablet Take 1,000 Units by mouth daily.     colchicine  0.6 MG tablet Take twice daily for gout attack. (may take every two hours up to 6 doses at acute onset) 60 tablet 2   divalproex  (DEPAKOTE ) 500 MG DR tablet Take two tablets in the morning and take two tablets at bedtime. 360 tablet 1   donepezil  (ARICEPT ) 5 MG tablet Take 1 tablet (5 mg total) by mouth at bedtime. 90 tablet 0   famotidine  (PEPCID ) 20 MG tablet Take 1 tablet (20 mg total) by mouth 2 (two) times daily. 180 tablet 3   furosemide (LASIX) 20 MG tablet Take 1 tablet (20 mg total) by mouth daily. For swelling  90 tablet 3   methocarbamol  (ROBAXIN ) 500 MG tablet Take 1 tablet (500 mg total) by mouth 4 (four) times daily. 120 tablet 1   pantoprazole  (PROTONIX ) 40 MG tablet Take 1 tablet (40 mg total) by mouth daily. 90 tablet 3   potassium chloride SA (KLOR-CON M) 10 MEQ tablet Take 1 tablet (10 mEq total) by mouth daily. For potassium replacement/ supplement 90 tablet 3   tamsulosin   (FLOMAX ) 0.4 MG CAPS capsule Take 2 capsules (0.8 mg total) by mouth at bedtime. For urine flow and prostate 180 capsule 3   No current facility-administered medications for this visit.    Medication Side Effects: None  Allergies:  Allergies  Allergen Reactions   Lipitor [Atorvastatin] Other (See Comments)   Niacin Other (See Comments)    Hot flash    Past Medical History:  Diagnosis Date   Allergy    Bipolar 1 disorder (HCC)    GERD (gastroesophageal reflux disease)    Hyperlipidemia    Hypertension     Family History  Problem Relation Age of Onset   Cancer Mother        breast   Cancer Father        lug   Esophageal cancer Maternal Grandfather    Cancer Maternal Grandfather    Cancer Paternal Grandfather    Colon cancer Neg Hx    Colon polyps Neg Hx    Rectal cancer Neg Hx    Stomach cancer Neg Hx     Social History   Socioeconomic History   Marital status: Married    Spouse name: Not on file   Number of children: Not on file   Years of education: Not on file   Highest education level: Associate degree: occupational, Scientist, product/process development, or vocational program  Occupational History   Not on file  Tobacco Use   Smoking status: Never   Smokeless tobacco: Never  Vaping Use   Vaping status: Never Used  Substance and Sexual Activity   Alcohol use: Not Currently   Drug use: No   Sexual activity: Not on file  Other Topics Concern   Not on file  Social History Narrative   Not on file   Social Drivers of Health   Financial Resource Strain: Low Risk  (06/07/2023)   Overall Financial Resource Strain (CARDIA)    Difficulty of Paying Living Expenses: Not hard at all  Food Insecurity: No Food Insecurity (06/07/2023)   Hunger Vital Sign    Worried About Running Out of Food in the Last Year: Never true    Ran Out of Food in the Last Year: Never true  Transportation Needs: No Transportation Needs (06/07/2023)   PRAPARE - Administrator, Civil Service (Medical):  No    Lack of Transportation (Non-Medical): No  Physical Activity: Sufficiently Active (06/07/2023)   Exercise Vital Sign    Days of Exercise per Week: 3 days    Minutes of Exercise per Session: 50 min  Stress: No Stress Concern Present (06/07/2023)   [employer] of Occupational Health - Occupational Stress Questionnaire    Feeling of Stress : Only a little  Social Connections: Socially Integrated (06/07/2023)   Social Connection and Isolation Panel [NHANES]    Frequency of Communication with Friends and Family: Twice a week    Frequency of Social Gatherings with Friends and Family: Once a week    Attends Religious Services: 1 to 4 times per year    Active Member of [employer] or Organizations:  Yes    Attends Club or Organization Meetings: 1 to 4 times per year    Marital Status: Married  Catering manager Violence: Not on file    Past Medical History, Surgical history, Social history, and Family history were reviewed and updated as appropriate.   Please see review of systems for further details on the patient's review from today.   Objective:   Physical Exam:  There were no vitals taken for this visit.  Physical Exam Constitutional:      General: He is not in acute distress. Musculoskeletal:        General: No deformity.  Neurological:     Mental Status: He is alert and oriented to person, place, and time.     Coordination: Coordination normal.  Psychiatric:        Attention and Perception: Attention and perception normal. He does not perceive auditory or visual hallucinations.        Mood and Affect: Mood normal. Mood is not anxious or depressed. Affect is not labile, blunt, angry or inappropriate.        Speech: Speech normal.        Behavior: Behavior normal.        Thought Content: Thought content normal. Thought content is not paranoid or delusional. Thought content does not include homicidal or suicidal ideation. Thought content does not include homicidal or suicidal  plan.        Cognition and Memory: Cognition and memory normal.        Judgment: Judgment normal.     Comments: Insight intact     Lab Review:     Component Value Date/Time   NA 143 09/05/2023 1629   K 3.9 09/05/2023 1629   CL 109 (H) 09/05/2023 1629   CO2 22 09/05/2023 1629   GLUCOSE 96 09/05/2023 1629   GLUCOSE 117 (H) 02/26/2022 2205   BUN 10 09/05/2023 1629   CREATININE 1.22 09/05/2023 1629   CALCIUM  9.2 09/05/2023 1629   PROT 6.2 09/05/2023 1629   ALBUMIN 3.5 (L) 09/05/2023 1629   AST 21 09/05/2023 1629   ALT 9 09/05/2023 1629   ALKPHOS 71 09/05/2023 1629   BILITOT 0.3 09/05/2023 1629   GFRNONAA >60 02/26/2022 2205   GFRAA 61 05/29/2020 0854       Component Value Date/Time   WBC 6.3 09/05/2023 1629   WBC 9.1 02/26/2022 2205   RBC 3.65 (L) 09/05/2023 1629   RBC 4.00 (L) 02/26/2022 2205   HGB 11.2 (L) 09/05/2023 1629   HCT 34.4 (L) 09/05/2023 1629   PLT 194 09/05/2023 1629   MCV 94 09/05/2023 1629   MCH 30.7 09/05/2023 1629   MCH 31.0 02/26/2022 2205   MCHC 32.6 09/05/2023 1629   MCHC 33.2 02/26/2022 2205   RDW 15.1 09/05/2023 1629   LYMPHSABS 1.6 09/05/2023 1629   MONOABS 0.6 02/26/2022 2205   EOSABS 0.1 09/05/2023 1629   BASOSABS 0.0 09/05/2023 1629    Lithium  Lvl  Date Value Ref Range Status  06/09/2022 1.3 (HH) 0.5 - 1.2 mmol/L Final    Comment:    A concentration of 0.5-0.8 mmol/L is advised for long-term use; concentrations of up to 1.2 mmol/L may be necessary during acute treatment.                                  Detection Limit = 0.1                           <  0.1 indicates None Detected Patient drug level exceeds published reference range.  Evaluate clinically for signs of potential toxicity.      Lab Results  Component Value Date   VALPROATE 79 04/20/2023     .res Assessment: Plan:    Plan:  PDMP reviewed  Continue:  D/C Zyprexa  5mg  at hs.  Depakote  1000mg  BID.  Neuropsych evaluation completed in April.  RTC 4 weeks    25 minutes spent dedicated to the care of this patient on the date of this encounter to include pre-visit review of records, ordering of medication, post visit documentation, and face-to-face time with the patient discussing Bipolar Disorder, GAD and insomnia. Discussed continuing current medication regimen.  Patient totally disabled an unable to work with current disabilities - mental health issues Bipolar disorder and recently diagnosed with Parkinson's with Lewy Body Dementia.   Discussed potential metabolic side effects associated with atypical antipsychotics, as well as potential risk for movement side effects. Advised pt to contact office if movement side effects occur.    Patient advised to contact office with any questions, adverse effects, or acute worsening in signs and symptoms.  There are no diagnoses linked to this encounter.   Please see After Visit Summary for patient specific instructions.  Future Appointments  Date Time Provider Department Center  09/20/2023  2:55 PM [name], MD [facility] None  10/23/2023 10:30 AM Dohmeier, Raoul Byes, MD [facility] None  11/20/2023  3:55 PM [name], MD [facility] None  02/14/2024  9:00 AM Marrion Sjogren, PsyD [facility] CPR  02/15/2024 11:00 AM Marrion Sjogren, PsyD [facility] CPR    No orders of the defined types were placed in this encounter.     -------------------------------

## 2023-09-07 ENCOUNTER — Telehealth: Payer: Self-pay

## 2023-09-07 ENCOUNTER — Ambulatory Visit: Payer: Self-pay | Admitting: Family Medicine

## 2023-09-07 NOTE — Telephone Encounter (Signed)
 Copied from CRM [id number]. Topic: Clinical - Lab/Test Results >> Sep 07, 2023 11:17 AM Retta Caster wrote: Reason for CRM: [facility] for Lab results verbatim  Mild anemia. It does not seem to be related to iron deficiency. It is more likely to be related to your recent illness. Will just recheck it at your next follow-up visit to see if it is persistent. If it is still there that time we may have to look closer.No further questions

## 2023-09-07 NOTE — Telephone Encounter (Signed)
 fyi

## 2023-09-07 NOTE — Telephone Encounter (Signed)
 Documented in result note. LS

## 2023-09-10 ENCOUNTER — Emergency Department (HOSPITAL_BASED_OUTPATIENT_CLINIC_OR_DEPARTMENT_OTHER)

## 2023-09-10 ENCOUNTER — Emergency Department (HOSPITAL_BASED_OUTPATIENT_CLINIC_OR_DEPARTMENT_OTHER)
Admission: EM | Admit: 2023-09-10 | Discharge: 2023-09-10 | Disposition: A | Discharge status: Home / Self Care | Attending: Emergency Medicine | Admitting: Emergency Medicine

## 2023-09-10 ENCOUNTER — Encounter (HOSPITAL_BASED_OUTPATIENT_CLINIC_OR_DEPARTMENT_OTHER): Payer: Self-pay | Admitting: Emergency Medicine

## 2023-09-10 ENCOUNTER — Other Ambulatory Visit: Payer: Self-pay

## 2023-09-10 DIAGNOSIS — R296 Repeated falls: Secondary | ICD-10-CM | POA: Diagnosis not present

## 2023-09-10 DIAGNOSIS — M25551 Pain in right hip: Secondary | ICD-10-CM | POA: Insufficient documentation

## 2023-09-10 DIAGNOSIS — M545 Low back pain, unspecified: Secondary | ICD-10-CM | POA: Diagnosis not present

## 2023-09-10 DIAGNOSIS — M542 Cervicalgia: Secondary | ICD-10-CM | POA: Diagnosis not present

## 2023-09-10 LAB — CBC WITH DIFFERENTIAL/PLATELET
Abs Immature Granulocytes: 0.06 10*3/uL (ref 0.00–0.07)
Basophils Absolute: 0 10*3/uL (ref 0.0–0.1)
Basophils Relative: 0 %
Eosinophils Absolute: 0.1 10*3/uL (ref 0.0–0.5)
Eosinophils Relative: 2 %
HCT: 35.3 % — ABNORMAL LOW (ref 39.0–52.0)
Hemoglobin: 11.4 g/dL — ABNORMAL LOW (ref 13.0–17.0)
Immature Granulocytes: 1 %
Lymphocytes Relative: 27 %
Lymphs Abs: 1.9 10*3/uL (ref 0.7–4.0)
MCH: 30.1 pg (ref 26.0–34.0)
MCHC: 32.3 g/dL (ref 30.0–36.0)
MCV: 93.1 fL (ref 80.0–100.0)
Monocytes Absolute: 0.6 10*3/uL (ref 0.1–1.0)
Monocytes Relative: 9 %
Neutro Abs: 4.4 10*3/uL (ref 1.7–7.7)
Neutrophils Relative %: 61 %
Platelets: 193 10*3/uL (ref 150–400)
RBC: 3.79 MIL/uL — ABNORMAL LOW (ref 4.22–5.81)
RDW: 14.6 % (ref 11.5–15.5)
WBC: 7 10*3/uL (ref 4.0–10.5)
nRBC: 0 % (ref 0.0–0.2)

## 2023-09-10 LAB — COMPREHENSIVE METABOLIC PANEL WITH GFR
ALT: <5 U/L (ref 0–44)
AST: 19 U/L (ref 15–41)
Albumin: 3.6 g/dL (ref 3.5–5.0)
Alkaline Phosphatase: 58 U/L (ref 38–126)
Anion gap: 11 (ref 5–15)
BUN: 18 mg/dL (ref 8–23)
CO2: 25 mmol/L (ref 22–32)
Calcium: 9.7 mg/dL (ref 8.9–10.3)
Chloride: 101 mmol/L (ref 98–111)
Creatinine, Ser: 1.55 mg/dL — ABNORMAL HIGH (ref 0.61–1.24)
GFR, Estimated: 50 mL/min — ABNORMAL LOW (ref >60)
Glucose, Bld: 97 mg/dL (ref 70–99)
Potassium: 4.4 mmol/L (ref 3.5–5.1)
Sodium: 137 mmol/L (ref 135–145)
Total Bilirubin: 0.2 mg/dL (ref 0.0–1.2)
Total Protein: 6.8 g/dL (ref 6.5–8.1)

## 2023-09-10 LAB — MAGNESIUM: Magnesium: 2.1 mg/dL (ref 1.7–2.4)

## 2023-09-10 LAB — LIPASE, BLOOD: Lipase: 36 U/L (ref 11–51)

## 2023-09-10 MED ORDER — IOHEXOL 300 MG/ML  SOLN
100.0000 mL | Freq: Once | INTRAMUSCULAR | Status: AC | PRN
  Administered 2023-09-10: 100 mL via INTRAVENOUS

## 2023-09-10 MED ORDER — KETOROLAC TROMETHAMINE 15 MG/ML IJ SOLN
15.0000 mg | Freq: Once | INTRAMUSCULAR | Status: AC
  Administered 2023-09-10: 15 mg via INTRAVENOUS
  Filled 2023-09-10: qty 1

## 2023-09-10 MED ORDER — ONDANSETRON HCL 4 MG/2ML IJ SOLN
4.0000 mg | Freq: Once | INTRAMUSCULAR | Status: AC
  Administered 2023-09-10: 4 mg via INTRAVENOUS
  Filled 2023-09-10: qty 2

## 2023-09-10 MED ORDER — SODIUM CHLORIDE 0.9 % IV BOLUS
1000.0000 mL | Freq: Once | INTRAVENOUS | Status: AC
  Administered 2023-09-10: 1000 mL via INTRAVENOUS

## 2023-09-10 MED ORDER — MORPHINE SULFATE (PF) 4 MG/ML IV SOLN
4.0000 mg | Freq: Once | INTRAVENOUS | Status: AC
  Administered 2023-09-10: 4 mg via INTRAVENOUS
  Filled 2023-09-10: qty 1

## 2023-09-10 NOTE — ED Provider Notes (Signed)
 [facility] Provider Note   CSN: [id number] Arrival date & time: 09/10/23  2725     History  Chief Complaint  Patient presents with   Hip Pain    Melvin Roberts is a 63 y.o. male.  63 yo M chief complaints of fatigue frequent falls neck and back pain and now hip pain.  The patient was recently admitted to hospital outside of [place] when he went to visit his mother for Mother's Day.  At that time he was found to have rotavirus and thought to not be processing his Parkinson's medications.  Since he is got out of the hospital he has fallen almost daily.  Denies significant injury in his falls.  He has had progressive neck and back pain.  Started having pain to his right hip yesterday.  Seems worse with certain positions and seems to come and go.  During his stay he also has worried that they found a spot on his kidney.  He is awaiting CT imaging.   Hip Pain       Home Medications Prior to Admission medications   Medication Sig Start Date End Date Taking? Authorizing Provider  allopurinol  (ZYLOPRIM ) 300 MG tablet Take 1 tablet (300 mg total) by mouth daily. 08/21/23   [name], MD  carbidopa -levodopa  (SINEMET  IR) 25-100 MG tablet Take 2 tablets by mouth 3 (three) times daily. 08/31/23   [provider]  cholecalciferol (VITAMIN D ) 1000 UNITS tablet Take 1,000 Units by mouth daily.    [provider]  colchicine  0.6 MG tablet Take twice daily for gout attack. (may take every two hours up to 6 doses at acute onset) 08/21/23   [name], MD  divalproex  (DEPAKOTE ) 500 MG DR tablet Take two tablets in the morning and take two tablets at bedtime. 06/06/23   Mozingo, Regina Nattalie, NP  donepezil  (ARICEPT ) 5 MG tablet Take 1 tablet (5 mg total) by mouth at bedtime. 08/21/23   [name], MD  famotidine  (PEPCID ) 20 MG tablet Take 1 tablet (20 mg total) by mouth 2 (two) times daily. 08/21/23 08/15/24  [name], MD   furosemide (LASIX) 20 MG tablet Take 1 tablet (20 mg total) by mouth daily. For swelling 09/05/23   [name], MD  methocarbamol  (ROBAXIN ) 500 MG tablet Take 1 tablet (500 mg total) by mouth 4 (four) times daily. 08/21/23   [name], MD  pantoprazole  (PROTONIX ) 40 MG tablet Take 1 tablet (40 mg total) by mouth daily. 08/21/23   [name], MD  potassium chloride SA (KLOR-CON M) 10 MEQ tablet Take 1 tablet (10 mEq total) by mouth daily. For potassium replacement/ supplement 09/05/23   [name], MD  tamsulosin  (FLOMAX ) 0.4 MG CAPS capsule Take 2 capsules (0.8 mg total) by mouth at bedtime. For urine flow and prostate 08/21/23   [name], MD      Allergies    Lipitor [atorvastatin] and Niacin    Review of Systems   Review of Systems  Physical Exam Updated Vital Signs BP (!) 138/94 (BP Location: Right Wrist)   Pulse 75   Temp 98.1 F (36.7 C) (Oral)   Resp 18   Ht 5\' 11"  (1.803 m)   Wt 109.8 kg   SpO2 97%   BMI 33.75 kg/m  Physical Exam Vitals and nursing note reviewed.  Constitutional:      Appearance: He is well-developed.  HENT:     Head: Normocephalic and atraumatic.  Eyes:  Pupils: Pupils are equal, round, and reactive to light.  Neck:     Vascular: No JVD.  Cardiovascular:     Rate and Rhythm: Normal rate and regular rhythm.     Heart sounds: No murmur heard.    No friction rub. No gallop.  Pulmonary:     Effort: No respiratory distress.     Breath sounds: No wheezing.  Abdominal:     General: There is no distension.     Tenderness: There is no abdominal tenderness. There is no guarding or rebound.  Musculoskeletal:        General: Normal range of motion.     Cervical back: Normal range of motion and neck supple.  Skin:    Coloration: Skin is not pale.     Findings: No rash.  Neurological:     Mental Status: He is alert and oriented to person, place, and time.  Psychiatric:        Behavior: Behavior normal.     ED Results /  Procedures / Treatments   Labs (all labs ordered are listed, but only abnormal results are displayed) Labs Reviewed  CBC WITH DIFFERENTIAL/PLATELET - Abnormal; Notable for the following components:      Result Value   RBC 3.79 (*)    Hemoglobin 11.4 (*)    HCT 35.3 (*)    All other components within normal limits  COMPREHENSIVE METABOLIC PANEL WITH GFR - Abnormal; Notable for the following components:   Creatinine, Ser 1.55 (*)    GFR, Estimated 50 (*)    All other components within normal limits  LIPASE, BLOOD  MAGNESIUM    EKG EKG Interpretation Date/Time:  Sunday Sep 10 2023 20:43:32 EDT Ventricular Rate:  62 PR Interval:  159 QRS Duration:  112 QT Interval:  400 QTC Calculation: 407 R Axis:   50  Text Interpretation: Sinus rhythm Incomplete right bundle branch block Baseline wander TECHNICALLY DIFFICULT Otherwise no significant change Confirmed by Albertus Hughs [phone]) on 09/10/2023 8:46:09 PM  Radiology CT Cervical Spine Wo Contrast Result Date: 09/10/2023 CLINICAL DATA:  Frequent falls. EXAM: CT CERVICAL SPINE WITHOUT CONTRAST TECHNIQUE: Multidetector CT imaging of the cervical spine was performed without intravenous contrast. Multiplanar CT image reconstructions were also generated. RADIATION DOSE REDUCTION: This exam was performed according to the departmental dose-optimization program which includes automated exposure control, adjustment of the mA and/or kV according to patient size and/or use of iterative reconstruction technique. COMPARISON:  None Available. FINDINGS: Alignment: Normal. Skull base and vertebrae: No acute fracture. No primary bone lesion or focal pathologic process. Soft tissues and spinal canal: No prevertebral fluid or swelling. No visible canal hematoma. Disc levels: Mild to moderate severity endplate sclerosis, anterior osteophyte formation and posterior bony spurring are seen at the levels of C3-C4, C4-C5, C5-C6 and C6-C7. Marked severity intervertebral  disc space narrowing is also seen at C3-C4, C4-C5, C5-C6 and C6-C7. Normal, bilateral multilevel facet joints are noted. Upper chest: Negative. Other: None. IMPRESSION: 1. Marked severity multilevel degenerative changes, as described above. 2. No acute cervical spine fracture or subluxation. Electronically Signed   By: Virgle Grime M.D.   On: 09/10/2023 22:09   CT CHEST ABDOMEN PELVIS W CONTRAST Result Date: 09/10/2023 CLINICAL DATA:  Frequent falls. EXAM: CT CHEST, ABDOMEN, AND PELVIS WITH CONTRAST TECHNIQUE: Multidetector CT imaging of the chest, abdomen and pelvis was performed following the standard protocol during bolus administration of intravenous contrast. RADIATION DOSE REDUCTION: This exam was performed according to the departmental dose-optimization program  which includes automated exposure control, adjustment of the mA and/or kV according to patient size and/or use of iterative reconstruction technique. CONTRAST:  100mL OMNIPAQUE IOHEXOL 300 MG/ML  SOLN COMPARISON:  None Available. FINDINGS: CT CHEST FINDINGS Cardiovascular: No significant vascular findings. Normal heart size. No pericardial effusion. Mediastinum/Nodes: No enlarged mediastinal, hilar, or axillary lymph nodes. Thyroid  gland, trachea, and esophagus demonstrate no significant findings. Lungs/Pleura: Mild linear scarring and/or atelectasis is seen within the posterior aspects of the bilateral lung bases. Adjacent 3 mm and 4 mm noncalcified pulmonary nodules are seen within the lateral aspect of the right lung base (axial CT images 84 and 85, CT series 302). Musculoskeletal: No chest wall mass or suspicious bone lesions identified. CT ABDOMEN PELVIS FINDINGS Hepatobiliary: No focal liver abnormality is seen. No gallstones, gallbladder wall thickening, or biliary dilatation. Pancreas: Unremarkable. No pancreatic ductal dilatation or surrounding inflammatory changes. Spleen: Normal in size without focal abnormality. Adrenals/Urinary  Tract: Adrenal glands are unremarkable. Kidneys are normal in size, without renal calculi or hydronephrosis. Multiple bilateral simple renal cysts are seen. The largest measures approximately 5.0 cm and is seen within the upper right kidney. Bladder is unremarkable. Stomach/Bowel: There is a small hiatal hernia. The appendix is not clearly identified. No evidence of bowel wall thickening, distention, or inflammatory changes. Noninflamed diverticula are seen throughout the large bowel Vascular/Lymphatic: Mild aortic atherosclerosis. No enlarged abdominal or pelvic lymph nodes. Reproductive: The prostate gland is mildly enlarged. Other: No abdominal wall hernia or abnormality. No abdominopelvic ascites. Musculoskeletal: Degenerative changes are seen within the lower lumbar spine. No acute osseous abnormality is identified IMPRESSION: 1. No acute or active process within the chest, abdomen or pelvis. 2. Small hiatal hernia. 3. Colonic diverticulosis. 4. Multiple bilateral simple renal cysts. 5. Mildly enlarged prostate gland. Correlation with PSA levels is recommended. 6. Adjacent 3 mm and 4 mm noncalcified right basilar pulmonary nodules. No follow-up needed if patient is low-risk (and has no known or suspected primary neoplasm). Non-contrast chest CT can be considered in 12 months if patient is high-risk. This recommendation follows the consensus statement: Guidelines for Management of Incidental Pulmonary Nodules Detected on CT Images: From the Fleischner Society 2017; Radiology 2017; 284:228-243. 7. Aortic atherosclerosis. Electronically Signed   By: Virgle Grime M.D.   On: 09/10/2023 22:05   CT Head Wo Contrast Result Date: 09/10/2023 CLINICAL DATA:  Frequent falls. EXAM: CT HEAD WITHOUT CONTRAST TECHNIQUE: Contiguous axial images were obtained from the base of the skull through the vertex without intravenous contrast. RADIATION DOSE REDUCTION: This exam was performed according to the departmental  dose-optimization program which includes automated exposure control, adjustment of the mA and/or kV according to patient size and/or use of iterative reconstruction technique. COMPARISON:  None Available. FINDINGS: Brain: There is generalized cerebral atrophy with widening of the extra-axial spaces and ventricular dilatation. There are areas of decreased attenuation within the white matter tracts of the supratentorial brain, consistent with microvascular disease changes. Vascular: No hyperdense vessel or unexpected calcification. Skull: Normal. Negative for fracture or focal lesion. Sinuses/Orbits: A 10 mm right ethmoid sinus osteoma is seen. Other: None. IMPRESSION: 1. Generalized cerebral atrophy and microvascular disease changes of the supratentorial brain. 2. No acute intracranial abnormality. Electronically Signed   By: Virgle Grime M.D.   On: 09/10/2023 21:55    Procedures Procedures    Medications Ordered in ED Medications  sodium chloride  0.9 % bolus 1,000 mL (1,000 mLs Intravenous New Bag/Given 09/10/23 2044)  morphine (PF) 4 MG/ML injection 4  mg (4 mg Intravenous Given 09/10/23 2054)  ketorolac  (TORADOL ) 15 MG/ML injection 15 mg (15 mg Intravenous Given 09/10/23 2055)  ondansetron  (ZOFRAN ) injection 4 mg (4 mg Intravenous Given 09/10/23 2055)  iohexol (OMNIPAQUE) 300 MG/ML solution 100 mL (100 mLs Intravenous Contrast Given 09/10/23 2134)    ED Course/ Medical Decision Making/ A&P                                 Medical Decision Making Amount and/or Complexity of Data Reviewed Labs: ordered. Radiology: ordered. ECG/medicine tests: ordered.  Risk Prescription drug management.   63 yo M with a chief complaints of progressive neck back and hip pain.  Patient unfortunately has had frequent falls since he was hospitalized to about a month ago in [place].  He had imaging when he was in [place] that showed a new area on his kidney that was somewhat worrisome for renal cell carcinoma.   He is awaiting an outpatient workup for this.  Will obtain CT imaging to assess for mets.  With frequent falls and pain to his back will obtain CT imaging, to assess for metastasis versus fracture.  Blood work.  Treat pain.  Reassess.  Blood work without significant change to baseline renal function.  No acute anemia.  CT of the head without obvious acute intracranial pathology.  CT chest abdomen pelvis without obvious cancer, no obvious acute intra-abdominal or intraspinal pathology.  He did have some incidental lung nodules.  I discussed this with the patient and family.  He is feeling bit better on repeat assessment.  Will follow-up with your family doctor in the office.  10:23 PM:  I have discussed the diagnosis/risks/treatment options with the patient and family.  Evaluation and diagnostic testing in the emergency department does not suggest an emergent condition requiring admission or immediate intervention beyond what has been performed at this time.  They will follow up with PCP. We also discussed returning to the ED immediately if new or worsening sx occur. We discussed the sx which are most concerning (e.g., sudden worsening pain, fever, inability to tolerate by mouth) that necessitate immediate return. Medications administered to the patient during their visit and any new prescriptions provided to the patient are listed below.  Medications given during this visit Medications  sodium chloride  0.9 % bolus 1,000 mL (1,000 mLs Intravenous New Bag/Given 09/10/23 2044)  morphine (PF) 4 MG/ML injection 4 mg (4 mg Intravenous Given 09/10/23 2054)  ketorolac  (TORADOL ) 15 MG/ML injection 15 mg (15 mg Intravenous Given 09/10/23 2055)  ondansetron  (ZOFRAN ) injection 4 mg (4 mg Intravenous Given 09/10/23 2055)  iohexol (OMNIPAQUE) 300 MG/ML solution 100 mL (100 mLs Intravenous Contrast Given 09/10/23 2134)     The patient appears reasonably screen and/or stabilized for discharge and I doubt any other  medical condition or other [facility] requiring further screening, evaluation, or treatment in the ED at this time prior to discharge.          Final Clinical Impression(s) / ED Diagnoses Final diagnoses:  Right hip pain  Low back pain, unspecified back pain laterality, unspecified chronicity, unspecified whether sciatica present  Frequent falls    Rx / DC Orders ED Discharge Orders     None         Albertus Hughs, DO 09/10/23 2223

## 2023-09-10 NOTE — Discharge Instructions (Signed)
 Please follow-up with your doctor in the office.  Please return for worsening symptoms fever confusion.

## 2023-09-10 NOTE — ED Triage Notes (Signed)
 Pt with RT hip pain; also c/o neck and lower back pain that has progressively worsened; frequent falls x several months

## 2023-09-12 ENCOUNTER — Telehealth: Payer: Self-pay | Admitting: Neurology

## 2023-09-12 NOTE — Telephone Encounter (Signed)
 At 8:28 yesterday morning pt's wife left a message asking for a call from Rn to discuss concern about pt's symptoms worsening re: his gait, she can be reached at [phone]

## 2023-09-12 NOTE — Telephone Encounter (Signed)
 Spoke w/Pt wife regarding concerns. Wife states Pt continues to have issues w/gait - shuffle steps and it is worsening. Pt PCP started him on cabidopa/levodopa  in Jan 2025 as PCP suspected PD. Pt has had several adjustments to medication since then. Pt was recently seen in ED on 5/25 due to hip pain but nothing significant found and told to f/u w/PCP. Pt was also in hospital in [place] on 5/12 and was dx with rotavirus. Pt wife is asking if Dr. Albertina Hugger has any recommendations and if DAT scan is needed. Also asking if Dr. Albertina Hugger needs to see Pt before 10/23/23 visit. Informed wife Dr. Albertina Hugger is not in the office today but will ask her to review upon return and make any recommendations if necessary, and will reach out at that time. Pt wife voiced understanding.

## 2023-09-14 NOTE — Telephone Encounter (Signed)
 Wife has called, asking if pt can be seen earlier as a result of worsening symptoms.

## 2023-09-20 ENCOUNTER — Ambulatory Visit: Admitting: Family Medicine

## 2023-09-20 ENCOUNTER — Encounter: Payer: Self-pay | Admitting: Family Medicine

## 2023-09-20 VITALS — BP 116/81 | HR 75 | Temp 98.2°F | Ht 71.0 in | Wt 238.0 lb

## 2023-09-20 DIAGNOSIS — F3172 Bipolar disorder, in full remission, most recent episode hypomanic: Secondary | ICD-10-CM

## 2023-09-20 DIAGNOSIS — G20C Parkinsonism, unspecified: Secondary | ICD-10-CM | POA: Diagnosis not present

## 2023-09-20 DIAGNOSIS — N281 Cyst of kidney, acquired: Secondary | ICD-10-CM

## 2023-09-20 NOTE — Progress Notes (Unsigned)
 Subjective:  Patient ID: Melvin Roberts, male    DOB: 1960/12/25  Age: 63 y.o. MRN: [id number]  CC: Medical Management of Chronic Issues (Pt reports swelling has gone down. No falls. Seems to be doing good.) and neurology (Question bout imaging/Might need to be directed to neurology on the 16th. )   HPI Melvin Roberts presents for recheck of his parkinsonism.  He recently had a reaction to the extended release Sinemet  and had to be switched back to a higher dose of the shorter acting.  Today's into follow-up on that and he seems to be back to his stable normal state.  He was seen in the emergency room recently and had multiple scans.  Those were reviewed with the patient's wife and the patient.  She had multiple questions.  Each of these were answered to her satisfaction.  They do have a neurology evaluation coming up in 11 days.  Of note is that the previous scans that noted a renal mass had mandated a CT of the abdomen.  As result this was done in the emergency room a few days ago and the cysts have been found to be benign.     09/20/2023    2:46 PM 09/05/2023    3:31 PM 08/21/2023   10:26 AM  Depression screen PHQ 2/9  Decreased Interest 0 1 1  Down, Depressed, Hopeless 0 1 0  PHQ - 2 Score 0 2 1  Altered sleeping 1 0 1  Tired, decreased energy 1 1 1   Change in appetite 0 0 1  Feeling bad or failure about yourself  0 1 0  Trouble concentrating 1 1 1   Moving slowly or fidgety/restless 0 1 1  Suicidal thoughts 0 0 0  PHQ-9 Score 3 6 6   Difficult doing work/chores  Not difficult at all Not difficult at all    History Melvin Roberts has a past medical history of Allergy, Bipolar 1 disorder (HCC), GERD (gastroesophageal reflux disease), Hyperlipidemia, and Hypertension.   He has a past surgical history that includes Back surgery (11/17/1995); Hernia repair (04/19/2007); Eye surgery; Upper gastrointestinal endoscopy (x2); and Colonoscopy.   His family history includes Cancer in his  father, maternal grandfather, mother, and paternal grandfather; Esophageal cancer in his maternal grandfather.He reports that he has never smoked. He has never used smokeless tobacco. He reports that he does not currently use alcohol. He reports that he does not use drugs.    ROS Review of Systems  Constitutional:  Negative for fever.  Respiratory:  Negative for shortness of breath.   Cardiovascular:  Negative for chest pain.  Musculoskeletal:  Negative for arthralgias.  Skin:  Negative for rash.    Objective:  BP 116/81   Pulse 75   Temp 98.2 F (36.8 C)   Ht 5\' 11"  (1.803 m)   Wt 238 lb (108 kg)   SpO2 96%   BMI 33.19 kg/m   BP Readings from Last 3 Encounters:  09/20/23 116/81  09/10/23 118/87  09/05/23 126/81    Wt Readings from Last 3 Encounters:  09/20/23 238 lb (108 kg)  09/10/23 242 lb (109.8 kg)  09/05/23 242 lb (109.8 kg)     Physical Exam Vitals reviewed.  Constitutional:      Appearance: He is well-developed.  HENT:     Head: Normocephalic and atraumatic.     Right Ear: External ear normal.     Left Ear: External ear normal.     Mouth/Throat:  Pharynx: No oropharyngeal exudate or posterior oropharyngeal erythema.  Eyes:     Pupils: Pupils are equal, round, and reactive to light.  Cardiovascular:     Rate and Rhythm: Normal rate and regular rhythm.     Heart sounds: No murmur heard. Pulmonary:     Effort: No respiratory distress.     Breath sounds: Normal breath sounds.  Musculoskeletal:     Cervical back: Normal range of motion and neck supple.  Neurological:     Mental Status: He is alert and oriented to person, place, and time.      Assessment & Plan:  Parkinsonism, unspecified Parkinsonism type (HCC)  Bipolar disorder, in full remission, most recent episode hypomanic (HCC)  Bilateral renal cysts   Patient and his wife were reassured regarding the benign nature of the renal cysts.  Multiple questions regarding the scans done in  the emergency room were answered to patient and wife satisfaction.  Patient continues to take Depakote  for his bipolar disease.  He and his wife had questions about whether that is implicated as the cause of the parkinsonism.  My answer was that this was quite rare but could happen.  They agree however that even if Depakote  did initiate his parkinsonism, the Depakote  was essential for his bipolar disorder and therefore they would not consider a trial off of the medication.  Follow-up: Return in about 3 months (around 12/21/2023).  Melvin Roberts, M.D.

## 2023-09-22 ENCOUNTER — Encounter: Payer: Self-pay | Admitting: Family Medicine

## 2023-09-28 ENCOUNTER — Telehealth: Payer: Self-pay | Admitting: Family Medicine

## 2023-09-28 DIAGNOSIS — Z0279 Encounter for issue of other medical certificate: Secondary | ICD-10-CM

## 2023-09-28 NOTE — Telephone Encounter (Signed)
 Patient dropped off Handicap forms to be completed and signed.  Form Fee Paid? (Y/N)       y     If NO, form is placed on front office manager desk to hold until payment received. If YES, then form will be placed in the RX/HH Nurse Coordinators box for completion.  Form will not be processed until payment is received

## 2023-10-02 ENCOUNTER — Ambulatory Visit: Admitting: Neurology

## 2023-10-02 ENCOUNTER — Telehealth: Payer: Self-pay | Admitting: Neurology

## 2023-10-02 ENCOUNTER — Encounter: Payer: Self-pay | Admitting: Neurology

## 2023-10-02 ENCOUNTER — Telehealth: Payer: Self-pay | Admitting: Family Medicine

## 2023-10-02 ENCOUNTER — Encounter: Payer: Self-pay | Admitting: Family Medicine

## 2023-10-02 VITALS — BP 119/83 | HR 70 | Ht 71.0 in | Wt 236.0 lb

## 2023-10-02 DIAGNOSIS — R251 Tremor, unspecified: Secondary | ICD-10-CM | POA: Diagnosis not present

## 2023-10-02 DIAGNOSIS — F339 Major depressive disorder, recurrent, unspecified: Secondary | ICD-10-CM | POA: Diagnosis not present

## 2023-10-02 DIAGNOSIS — R413 Other amnesia: Secondary | ICD-10-CM

## 2023-10-02 DIAGNOSIS — Z8659 Personal history of other mental and behavioral disorders: Secondary | ICD-10-CM

## 2023-10-02 MED ORDER — CARBIDOPA-LEVODOPA 25-100 MG PO TABS: 1.0000 | ORAL_TABLET | Freq: Three times a day (TID) | ORAL | 3 refills | Status: DC

## 2023-10-02 NOTE — Telephone Encounter (Signed)
 The lumbar puncture with PT was sent to [facility] to schedule and they have left a voice mail for him to call them back. The phone number is [phone]

## 2023-10-02 NOTE — Progress Notes (Signed)
 Provider:  Neomia Banner, MD  Primary Care Physician:  Melvin Cleaver, MD [address] [place] [address]     Referring Provider: [name], Md [address] [place],  [place] [address]          Chief Complaint according to patient   Patient presents with:          Here for follow up. He has had some changes and decline with walking and gait. Wife states he leans to right and sometimes walks to right. Balance has remained off. Went to [place] and ended up in hospital due to health issues that were going on. He had his neuro psych eval on 5/19. Pcp started him on parkinson meds and has taken IR form and hasn't noted benefit.       HISTORY OF PRESENT ILLNESS:  Melvin Roberts is a 63 y.o. male patient who is here for revisit 10/02/2023 for  Tremor and gait and memory. .  Chief concern according to patient :    Has been doing poorly with medication but feels his Aricept  has helped. I saw his  new walker, he has noted some freezing -  as if he has to tell his body to move and its doesn't do what he wants.   Dr Veleta Gerold started the carbi=levo dopa.  25/ 100 tid but he has taken it with meals or after.   For tremor. Its a right hand dominant individual with a right hand tremor and leans to the right stopped driving.  He couldn't keep the lane   Lithium  and Depakote  medication history - and GDS is 12/ 15 points.This shouldn't be asymmetric tremor.   MRI in January , in May  another image in [place],      Plan : He needs a DAT scan. He needs PT, OT, ST evaluation.  Parkinsonian  rehabilitation.         Melvin Roberts is a 63 y.o. male patient who is seen upon referral on 04/20/2023 from PCP.  Chief concern according to patient :  I started to have a lot of trouble after I had a partial knee replacement May 2023, and sine then I ave been having progressive STM loss, getting confused. His wife interjects that all these confusional spells started earlier, he had to  write everything down,  since 2022- he missed things at work, and has been out of work since 04-2021.   He developed tremor in the right had while still at work and now both hands are affected. He sometimes had to hold the right hand with the left to stabilize it, and he worked as Education administrator. ( [employer] now [employer]).       I have the pleasure of seeing Melvin Roberts 04/20/23 a right -handed male with a concern of declining cognitive function over a period of now 3 years - slowly progressive, more notable since he has been at home for 12 months now.      Family history of BIPOLAR disorder , Maternal GM, mother was not affected.  Father had bipolar depression or major depression.  Patient was dx at age 64 with bpiolar.    On depakote  and used to be in Lithium .     Review of Systems: Out of a complete 14 system review, the patient complains of only the following symptoms, and all other reviewed systems are negative.:   How likely are you  to doze in the following situations: 0 = not likely, 1 = slight chance, 2 = moderate chance, 3 = high chance  Sitting and Reading? Watching Television? Sitting inactive in a public place (theater or meeting)? Lying down in the afternoon when circumstances permit? Sitting and talking to someone? Sitting quietly after lunch without alcohol? In a car, while stopped for a few minutes in traffic? As a passenger in a car for an hour without a break?  Total = 11   GDS : 12/ 15     Social History   Socioeconomic History   Marital status: Married    Spouse name: Not on file   Number of children: Not on file   Years of education: Not on file   Highest education level: Associate degree: occupational, Scientist, product/process development, or vocational program  Occupational History   Not on file  Tobacco Use   Smoking status: Never   Smokeless tobacco: Never  Vaping Use   Vaping status: Never Used  Substance and Sexual Activity   Alcohol use: Not  Currently   Drug use: No   Sexual activity: Not on file  Other Topics Concern   Not on file  Social History Narrative   Not on file   Social Drivers of Health   Financial Resource Strain: Low Risk  (06/07/2023)   Overall Financial Resource Strain (CARDIA)    Difficulty of Paying Living Expenses: Not hard at all  Food Insecurity: No Food Insecurity (06/07/2023)   Hunger Vital Sign    Worried About Running Out of Food in the Last Year: Never true    Ran Out of Food in the Last Year: Never true  Transportation Needs: No Transportation Needs (06/07/2023)   PRAPARE - Administrator, Civil Service (Medical): No    Lack of Transportation (Non-Medical): No  Physical Activity: Sufficiently Active (06/07/2023)   Exercise Vital Sign    Days of Exercise per Week: 3 days    Minutes of Exercise per Session: 50 min  Stress: No Stress Concern Present (06/07/2023)   [employer] of Occupational Health - Occupational Stress Questionnaire    Feeling of Stress : Only a little  Social Connections: Socially Integrated (06/07/2023)   Social Connection and Isolation Panel    Frequency of Communication with Friends and Family: Twice a week    Frequency of Social Gatherings with Friends and Family: Once a week    Attends Religious Services: 1 to 4 times per year    Active Member of [employer] or Organizations: Yes    Attends Banker Meetings: 1 to 4 times per year    Marital Status: Married    Family History  Problem Relation Age of Onset   Cancer Mother        breast   Cancer Father        lug   Esophageal cancer Maternal Grandfather    Cancer Maternal Grandfather    Cancer Paternal Grandfather    Colon cancer Neg Hx    Colon polyps Neg Hx    Rectal cancer Neg Hx    Stomach cancer Neg Hx     Past Medical History:  Diagnosis Date   Allergy    Bipolar 1 disorder (HCC)    GERD (gastroesophageal reflux disease)    Hyperlipidemia    Hypertension     Past Surgical  History:  Procedure Laterality Date   BACK SURGERY  11/17/1995   Dr. Leighton Punches   COLONOSCOPY  EYE SURGERY     HERNIA REPAIR  04/19/2007   umbilical   UPPER GASTROINTESTINAL ENDOSCOPY  x2   w/ esophageal dilation     Current Outpatient Medications on File Prior to Visit  Medication Sig Dispense Refill   allopurinol  (ZYLOPRIM ) 300 MG tablet Take 1 tablet (300 mg total) by mouth daily. 90 tablet 1   carbidopa -levodopa  (SINEMET  IR) 25-100 MG tablet Take 2 tablets by mouth 3 (three) times daily. (Patient taking differently: Take 2 tablets by mouth 3 (three) times daily. Take 2 tab in am, 1 tab at noon, 2 tab around 5 pm and 1 tab around 10 pm(per PCP))     cholecalciferol (VITAMIN D ) 1000 UNITS tablet Take 1,000 Units by mouth daily.     colchicine  0.6 MG tablet Take twice daily for gout attack. (may take every two hours up to 6 doses at acute onset) 60 tablet 2   divalproex  (DEPAKOTE ) 500 MG DR tablet Take two tablets in the morning and take two tablets at bedtime. 360 tablet 1   donepezil  (ARICEPT ) 5 MG tablet Take 1 tablet (5 mg total) by mouth at bedtime. 90 tablet 0   famotidine  (PEPCID ) 20 MG tablet Take 1 tablet (20 mg total) by mouth 2 (two) times daily. 180 tablet 3   methocarbamol  (ROBAXIN ) 500 MG tablet Take 1 tablet (500 mg total) by mouth 4 (four) times daily. 120 tablet 1   pantoprazole  (PROTONIX ) 40 MG tablet Take 1 tablet (40 mg total) by mouth daily. 90 tablet 3   tamsulosin  (FLOMAX ) 0.4 MG CAPS capsule Take 2 capsules (0.8 mg total) by mouth at bedtime. For urine flow and prostate 180 capsule 3   No current facility-administered medications on file prior to visit.    Allergies  Allergen Reactions   Lipitor [Atorvastatin] Other (See Comments)   Niacin Other (See Comments)    Hot flash     DIAGNOSTIC DATA (LABS, IMAGING, TESTING) - I reviewed patient records, labs, notes, testing and imaging myself where available.  Lab Results  Component Value Date   WBC 7.0  09/10/2023   HGB 11.4 (L) 09/10/2023   HCT 35.3 (L) 09/10/2023   MCV 93.1 09/10/2023   PLT 193 09/10/2023      Component Value Date/Time   NA 137 09/10/2023 2039   NA 143 09/05/2023 1629   K 4.4 09/10/2023 2039   CL 101 09/10/2023 2039   CO2 25 09/10/2023 2039   GLUCOSE 97 09/10/2023 2039   BUN 18 09/10/2023 2039   BUN 10 09/05/2023 1629   CREATININE 1.55 (H) 09/10/2023 2039   CALCIUM  9.7 09/10/2023 2039   PROT 6.8 09/10/2023 2039   PROT 6.2 09/05/2023 1629   ALBUMIN 3.6 09/10/2023 2039   ALBUMIN 3.5 (L) 09/05/2023 1629   AST 19 09/10/2023 2039   ALT <5 09/10/2023 2039   ALKPHOS 58 09/10/2023 2039   BILITOT 0.2 09/10/2023 2039   BILITOT 0.3 09/05/2023 1629   GFRNONAA 50 (L) 09/10/2023 2039   GFRAA 61 05/29/2020 0854   Lab Results  Component Value Date   CHOL 172 07/28/2021   HDL 33 (L) 07/28/2021   LDLCALC 105 (H) 07/28/2021   TRIG 195 (H) 07/28/2021   CHOLHDL 5.2 (H) 07/28/2021   Lab Results  Component Value Date   HGBA1C 5.7 (H) 04/20/2023   Lab Results  Component Value Date   VITAMINB12 830 04/20/2023   Lab Results  Component Value Date   TSH 1.710 04/20/2023    PHYSICAL EXAM:  Vitals:   10/02/23 0935  BP: 119/83  Pulse: 70   No data found. Body mass index is 32.92 kg/m.   Wt Readings from Last 3 Encounters:  10/02/23 236 lb (107 kg)  09/20/23 238 lb (108 kg)  09/10/23 242 lb (109.8 kg)     Ht Readings from Last 3 Encounters:  10/02/23 5' 11 (1.803 m)  09/20/23 5' 11 (1.803 m)  09/10/23 5' 11 (1.803 m)      General: The patient is awake, alert and appears not in acute distress. The patient is groomed. Head: Normocephalic, atraumatic. Neck is supple. Dental status:  Cardiovascular:  Regular rate and cardiac rhythm by pulse, without distended neck veins. Respiratory: Lungs are clear to auscultation.  Skin:  Without evidence of ankle edema, or rash. Trunk: he sits erect. He has great difficulties to get out of the chair.      NEUROLOGIC EXAM: The patient is awake and alert, oriented to place and time.   Memory subjective described as intact.  Attention span & concentration ability appears normal.  Speech is fluent,  without  dysarthria, dysphonia or aphasia.  Mood and affect are appropriate.   Cranial nerves: no loss of smell or taste reported   Pupils are equal and briskly reactive to light. Funduscopic exam deferred. Extraocular movements with coarse saccades-   nystagmus. No Diplopia. Visual fields by finger perimetry are intact. Hearing was intact with hearing aids.   Facial sensation intact to fine touch.  NO FACIAL MASKING>   Facial motor strength is symmetric and tongue and uvula move midline.  Neck ROM : rotation, tilt and flexion extension were normal for age and shoulder shrug was symmetrical.     Motor exam:  Symmetric bulk, tone and ROM.   Elevated tone without cog- wheeling,  more waxy than clasp knife-  And symmetric grip strength .   Sensory:  normal.  Proprioception tested in the upper extremities was normal.   Coordination: Rapid alternating movements in the fingers/hands were of reduced  speed.  The Finger-to-nose maneuver was intact , slowed but no evidence of ataxia, dysmetria and minimal  tremor.   Gait and station: Patient could  not rise unassisted from a seated position, walked with a walker as assistive device.     Deep tendon reflexes: in the  upper and lower extremities are symmetric and intact.  Babinski response was deferred .    ASSESSMENT AND PLAN :   63 y.o. year old male  here with:    1) impaired STM and confusional spells, no auditory or visual hallucinations.      10/02/2023    9:32 AM 04/20/2023   11:03 AM 04/20/2023    9:32 AM  Montreal Cognitive Assessment   Visuospatial/ Executive (0/5) 4 4   Naming (0/3) 3 3   Attention: Read list of digits (0/2) 2 2   Attention: Read list of letters (0/1) 1 1   Attention: Serial 7 subtraction starting at 100 (0/3)  1 3   Language: Repeat phrase (0/2) 2 2   Language : Fluency (0/1) 0 1   Abstraction (0/2) 2 2   Delayed Recall (0/5) 0 0   Orientation (0/6) 6 6 6   Total 21 24      DAT scan ordered.  This will help to differentiate Lithium / Depakote  tremors.    PT and neuro-rehab  Gait, OT - ordered.   MRI not suggestive of NPH> but he has STM loss, gait shuffles and he is  urinary incontinent.   Dr Doretta Gant is aware of the abdominal study suggestive of a renal tumor.   I would like to thank Melvin Cleaver, MD for allowing me to meet with and to take care of this pleasant patient.   The patient's condition requires frequent monitoring and adjustments in the treatment plan, reflecting the ongoing complexity of care.  This provider is the continuing focal point for all needed services for this condition.  After spending a total time of  45  minutes face to face and time for  history taking, physical and neurologic examination, review of laboratory studies,  personal review of imaging studies, reports and results of other testing and review of referral information / records as far as provided in visit,   Electronically signed by: Melvin Banner, MD 10/02/2023 9:38 AM  [facility] Board certified by The [employer] of Sleep Medicine and Diplomate of the [employer] of Sleep Medicine. Board certified In Neurology through the ABPN, Fellow of the [employer] of Neurology.

## 2023-10-02 NOTE — Telephone Encounter (Signed)
 Filled by prescribing provider (not us ) today. No action needed at this time. LS

## 2023-10-02 NOTE — Addendum Note (Signed)
 Addended by: Elton Ham on: 10/02/2023 10:42 AM   Modules accepted: Orders

## 2023-10-02 NOTE — Telephone Encounter (Unsigned)
 Copied from CRM [id number]. Topic: Clinical - Medication Question >> Oct 02, 2023  8:17 AM [place] M wrote: Reason for CRM: patient medication carbidopa -levodopa  (SINEMET  IR) 25-100 MG tablet  was changed when he was on vacation to just a 30 day supply and he can not get it filled. Wanting to get another prescription sent in

## 2023-10-02 NOTE — Patient Instructions (Signed)
 Fluid in the Brain (Normal-Pressure Hydrocephalus) in Adults: What to Know Normal-pressure hydrocephalus (NPH) is a build-up of fluid called cerebrospinal fluid, or CSF, inside the brain. NPH doesn't usually increase the pressure inside the head. CSF is normally present in the brain, but when too much CSF is made and can't leave the brain, it can change how the brain works. NPH usually happens in people who are older than 63 years old. Many symptoms of NPH are also found with aging or with certain health problems. It is important to pay attention to any changes in your mood or your ability to think or walk. What are the causes? A build-up of fluid inside the brain may be caused by anything that blocks the flow of CSF, such as: Head injury. Infections. Bleeding from a blood vessel in the brain. In many cases, the cause is not known. What are the signs or symptoms? Symptoms include: Trouble walking. You may: Shuffle your feet when walking. Feel unsteady. Have problems walking after you have been sitting. Feel that your feet are stuck to the floor. Problems with memory and thinking, like: Forgetfulness. Not being able to concentrate. Mood problems. Confusion. Problems holding your pee. How is this diagnosed? A build-up of fluid inside the brain is diagnosed based on: Your medical history. A physical exam. This can show walking changes, twitching movements, or sudden muscle tightening (spasms) in the legs. Imaging tests such as: CT scan. MRI. You may need a test that uses a needle placed in your spine. This is called a lumbar puncture or spinal tap. During the spinal tap the health care provider may: Remove and look at a large amount of CSF. Inject a radioactive dye in order to take pictures with a special camera. These pictures can show how well the dye is flowing through the cavities within your brain. How is this treated? This problem may be treated with surgery in which a thin tube  is placed in the brain to drain extra CSF into another part of the body. This is called a shunt. After a shunt is placed, you will be monitored to make sure CSF is draining properly. In some cases, a shunt may be placed in the lower spine instead of the brain. If your symptoms are mild, you may be monitored regularly before a decision is made about whether a shunt is needed. Follow these instructions at home: Medicines Take your medicines only as told. Avoid taking medicines that can affect thinking, such as pain medicines or sleeping medicines. Lifestyle Do not smoke, vape, or use nicotine or tobacco. Drink more fluids as told. General instructions Work with your provider to determine what your safety needs are and if you need help at home. Ask what things are safe for you to do at home. Ask when you can go back to work or school. Keep all follow-up visits. Your provider will monitor your symptoms and the need for surgery. Contact a health care provider if: You have any new symptoms. Your symptoms get worse. You are having trouble walking or with balance. You have problems holding your pee. You or your family members become concerned for your safety. You notice you feel confused sometimes. Get help right away if: You start to twitch or shake (have a seizure). You have: A fever or other signs of infection. A hard time staying awake. A headache that is getting worse. Blurred vision, especially early in the morning. These symptoms may be an emergency. Call 911 right away.  Do not wait to see if the symptoms will go away. Do not drive yourself to the hospital. This information is not intended to replace advice given to you by your health care provider. Make sure you discuss any questions you have with your health care provider. Document Revised: 02/19/2023 Document Reviewed: 02/19/2023 Elsevier Patient Education  2025 [employer].

## 2023-10-03 ENCOUNTER — Other Ambulatory Visit: Payer: Self-pay | Admitting: Family Medicine

## 2023-10-03 DIAGNOSIS — N2889 Other specified disorders of kidney and ureter: Secondary | ICD-10-CM

## 2023-10-03 DIAGNOSIS — N400 Enlarged prostate without lower urinary tract symptoms: Secondary | ICD-10-CM

## 2023-10-04 ENCOUNTER — Ambulatory Visit (HOSPITAL_COMMUNITY): Attending: Neurology

## 2023-10-04 ENCOUNTER — Encounter (HOSPITAL_COMMUNITY): Payer: Self-pay

## 2023-10-04 ENCOUNTER — Encounter (HOSPITAL_COMMUNITY)
Admission: RE | Admit: 2023-10-04 | Discharge: 2023-10-04 | Discharge status: Home / Self Care | Source: Ambulatory Visit | Attending: Family Medicine | Admitting: Family Medicine

## 2023-10-04 ENCOUNTER — Ambulatory Visit (HOSPITAL_COMMUNITY)
Admission: RE | Admit: 2023-10-04 | Discharge: 2023-10-04 | Disposition: A | Discharge status: Home / Self Care | Source: Ambulatory Visit | Attending: Neurology | Admitting: Neurology

## 2023-10-04 VITALS — BP 132/88 | HR 66 | Temp 98.0°F | Resp 18

## 2023-10-04 DIAGNOSIS — G912 (Idiopathic) normal pressure hydrocephalus: Secondary | ICD-10-CM | POA: Diagnosis present

## 2023-10-04 DIAGNOSIS — R413 Other amnesia: Secondary | ICD-10-CM | POA: Insufficient documentation

## 2023-10-04 DIAGNOSIS — F339 Major depressive disorder, recurrent, unspecified: Secondary | ICD-10-CM | POA: Diagnosis present

## 2023-10-04 DIAGNOSIS — Z8659 Personal history of other mental and behavioral disorders: Secondary | ICD-10-CM | POA: Diagnosis present

## 2023-10-04 DIAGNOSIS — R251 Tremor, unspecified: Secondary | ICD-10-CM | POA: Insufficient documentation

## 2023-10-04 LAB — CSF CELL COUNT WITH DIFFERENTIAL
RBC Count, CSF: 340 /mm3 — ABNORMAL HIGH
Tube #: 4
WBC, CSF: 3 /mm3 (ref 0–5)

## 2023-10-04 LAB — PROTEIN AND GLUCOSE, CSF
Glucose, CSF: 56 mg/dL (ref 40–70)
Total  Protein, CSF: 45 mg/dL (ref 15–45)

## 2023-10-04 NOTE — Progress Notes (Signed)
 PT in to see patient and walked him around the post op area. Pt tolerated well and stated he felt some better all ready.

## 2023-10-04 NOTE — Discharge Instructions (Signed)
 Myelogram and Lumbar Puncture Discharge Instructions  Go home and rest quietly for the next 24 hours.  It is important to lie flat for the next 24 hours.  Get up only to go to the restroom.  You may lie in the bed or on a couch on your back, your stomach, your left side or your right side.  You may have one pillow under your head.  You may have pillows between your knees while you are on your side or under your knees while you are on your back.  DO NOT drive today.  Recline the seat as far back as it will go, while still wearing your seat belt, on the way home.  You may get up to go to the bathroom as needed.  You may sit up for 10 minutes to eat.  You may resume your normal diet and medications unless otherwise indicated.  The incidence of headache, nausea, or vomiting is about 5% (one in 20 patients).  If you develop a headache, lie flat and drink plenty of fluids until the headache goes away.  Caffeinated beverages may be helpful.  If you develop severe nausea and vomiting or a headache that does not go away with flat bed rest, call [phone].  You may resume normal activities after your 24 hours of bed rest is over; however, do not exert yourself strongly or do any heavy lifting tomorrow.  Call your physician for a follow-up appointment.  The results of your myelogram will be sent directly to your physician by the following day.  If you have any questions or if complications develop after you arrive home, please call [phone].  Discharge instructions have been explained to the patient.  The patient, or the person responsible for the patient, fully understands these instructions.

## 2023-10-04 NOTE — Therapy (Signed)
 [facility] [address] [place], [place], [address] Phone: [phone]   Fax:  [phone]  Patient Details  Name: Melvin Roberts MRN: [id number] Date of Birth: 04-06-1961 Referring Provider:  Neomia Banner, MD  Encounter Date: 10/04/2023  Patient seen for pre-spinal puncture:  Patient presents ambulating with Rollator, with wide base of support, ataxic like move of legs, short stride length with festinating like movement, decreased bilateral ankle dorsiflexion with limited heel to toe stepping, and patient states on occasion when walking longer distances his step/stride length decreases to the point of having to to stop requiring increased time before able to take longer steps.  Patient has normal MMT strength BUE, BLE, sensation intact, overall coordination normal except having to use Rollator for decreased balance due to gait deficits mentioned above, no c/o pain and mentation normal.   PLOF: Patient states he requires some assist for dressing, bathing, Independent for transfers using his rollator, bed mobility,Independent for most household ADLs  11:26 AM, 10/04/23 Walton Guppy, MPT Physical Therapist with [facility] [phone] office [phone] mobile phone   [facility] [address] [place], [place], [address] Phone: [phone]   Fax:  [phone]

## 2023-10-04 NOTE — Therapy (Signed)
 [facility]STIC RADIOLOGY [address] [place], [place], [address] Phone: [phone]   Fax:  [phone]  Patient Details  Name: Melvin Roberts MRN: [id number] Date of Birth: 04/24/1960 Referring Provider:  Neomia Banner, MD  Encounter Date: 10/04/2023  Patient seen greater than 1 hour post op spinal puncture   Patient presents with no change in functional mobility, MMT testing, sensation, and gait. Patient states he feels better overall after procedure.  Patient put back to bed after visit.   2:09 PM, 10/04/23 Walton Guppy, MPT Physical Therapist with [facility] [phone] office [phone] mobile phone   [facility] DIAGNOSTIC RADIOLOGY [address] [place], [place], [address] Phone: [phone]   Fax:  [phone]

## 2023-10-04 NOTE — Procedures (Signed)
 Technically successful fluoro guided LP at L2-L level with opening pressure of 17 cm H2O  30 cc of clear, colorless CSF sent to lab for analysis.  No immediate post procedural complication.   - Bedrest with bathroom privileges x 4 hours, may work with PT after 1 hour of bedrest - Head of bed flat x 4 hours - Advance diet as tolerated - Encourage PO fluids, with caffeine if able to tolerate - Tylenol PRN of head - May discharge 4 hours post procedure if stable, lay flat as much as possible for the remainder of the day  Please see imaging section of Epic for full dictation.  Nathan Bake, PA-C

## 2023-10-05 NOTE — Progress Notes (Signed)
 Called pt to follow up and see how he was doing. He stated he was doing well and his headache was gone. I informed him to follow up with the doctor that ordered the procedure and he verbalized understanding.  This call was placed 10-05-23 at 322pm.

## 2023-10-06 LAB — CSF CULTURE W GRAM STAIN

## 2023-10-07 LAB — CSF CULTURE W GRAM STAIN: Culture: NO GROWTH

## 2023-10-09 ENCOUNTER — Encounter: Payer: Self-pay | Admitting: Adult Health

## 2023-10-09 ENCOUNTER — Telehealth (INDEPENDENT_AMBULATORY_CARE_PROVIDER_SITE_OTHER): Payer: Self-pay | Admitting: Adult Health

## 2023-10-09 DIAGNOSIS — F411 Generalized anxiety disorder: Secondary | ICD-10-CM

## 2023-10-09 DIAGNOSIS — F319 Bipolar disorder, unspecified: Secondary | ICD-10-CM | POA: Diagnosis not present

## 2023-10-09 DIAGNOSIS — G4700 Insomnia, unspecified: Secondary | ICD-10-CM

## 2023-10-09 NOTE — Progress Notes (Signed)
 Melvin Roberts [id number] 04/07/1961 63 y.o.  Virtual Visit via Video Note  I connected with pt @ on 10/09/23 at  9:30 AM EDT by a video enabled telemedicine application and verified that I am speaking with the correct person using two identifiers.   I discussed the limitations of evaluation and management by telemedicine and the availability of in person appointments. The patient expressed understanding and agreed to proceed.  I discussed the assessment and treatment plan with the patient. The patient was provided an opportunity to ask questions and all were answered. The patient agreed with the plan and demonstrated an understanding of the instructions.   The patient was advised to call back or seek an in-person evaluation if the symptoms worsen or if the condition fails to improve as anticipated.  I provided 25 minutes of non-face-to-face time during this encounter.  The patient was located at home.  The provider was located at [facility].   Angeline LOISE Sayers, NP   Subjective:   Patient ID:  Melvin Roberts is a 63 y.o. (DOB 10-09-1960) male.  Chief Complaint: No chief complaint on file.   HPI Arkin D Holston presents for follow-up of Bipolar Disorder, GAD and insomnia.  Accompanied by wife.  Describes mood today as ok. Mood symptoms - reports some depression. Denies anxiety and irritability. Reports varying interest and motivation. Denies panic attacks. Denies over thinking. Reports worry and rumination. Reports mood is stable. Stating I feel like I'm doing ok. Reports working with neurology and PCP. Reports recent lumbar puncture. Working on a urology, DAT scan and P/T referral. Taking medications as prescribed. Energy levels lower. Active, does not have a regular exercise routine - walking some days. Enjoys some usual interests and activities. Married. Lives with wife. Has 5 children and 11 grandchildren. Spending time with family. Appetite  adequate. Weight loss - 237 from 242 pounds - has been taking diuretics. Reports sleep consistent. Averages 6 to 7 hours or more. Reports napping on the afternoon. Reports focus and concentration difficulties - it's up and down. Managing minimal aspects of household. Disabled. Denies SI or HI.  Denies AH or VH. Denies self harm. Denies substance use. Reports hearing loss.  Previous medication trials: Lithium , Seroquel    Review of Systems:  Review of Systems  Musculoskeletal:  Negative for gait problem.  Neurological:  Negative for tremors.  Psychiatric/Behavioral:         Please refer to HPI    Medications: I have reviewed the patient's current medications.  Current Outpatient Medications  Medication Sig Dispense Refill   allopurinol  (ZYLOPRIM ) 300 MG tablet Take 1 tablet (300 mg total) by mouth daily. 90 tablet 1   carbidopa -levodopa  (SINEMET  IR) 25-100 MG tablet Take 1 tablet by mouth 3 (three) times daily. Take med 30 minutes before a meal. 270 tablet 3   cholecalciferol (VITAMIN D ) 1000 UNITS tablet Take 1,000 Units by mouth daily.     colchicine  0.6 MG tablet Take twice daily for gout attack. (may take every two hours up to 6 doses at acute onset) 60 tablet 2   divalproex  (DEPAKOTE ) 500 MG DR tablet Take two tablets in the morning and take two tablets at bedtime. 360 tablet 1   donepezil  (ARICEPT ) 5 MG tablet Take 1 tablet (5 mg total) by mouth at bedtime. 90 tablet 0   famotidine  (PEPCID ) 20 MG tablet Take 1 tablet (20 mg total) by mouth 2 (two) times daily. 180 tablet 3   methocarbamol  (ROBAXIN ) 500 MG tablet  Take 1 tablet (500 mg total) by mouth 4 (four) times daily. 120 tablet 1   pantoprazole  (PROTONIX ) 40 MG tablet Take 1 tablet (40 mg total) by mouth daily. 90 tablet 3   tamsulosin  (FLOMAX ) 0.4 MG CAPS capsule Take 2 capsules (0.8 mg total) by mouth at bedtime. For urine flow and prostate 180 capsule 3   No current facility-administered medications for this visit.     Medication Side Effects: None  Allergies:  Allergies  Allergen Reactions   Lipitor [Atorvastatin] Other (See Comments)   Niacin Other (See Comments)    Hot flash    Past Medical History:  Diagnosis Date   Allergy    Bipolar 1 disorder (HCC)    GERD (gastroesophageal reflux disease)    Hyperlipidemia    Hypertension     Family History  Problem Relation Age of Onset   Cancer Mother        breast   Cancer Father        lug   Esophageal cancer Maternal Grandfather    Cancer Maternal Grandfather    Cancer Paternal Grandfather    Colon cancer Neg Hx    Colon polyps Neg Hx    Rectal cancer Neg Hx    Stomach cancer Neg Hx     Social History   Socioeconomic History   Marital status: Married    Spouse name: Not on file   Number of children: Not on file   Years of education: Not on file   Highest education level: Associate degree: occupational, Scientist, product/process development, or vocational program  Occupational History   Not on file  Tobacco Use   Smoking status: Never   Smokeless tobacco: Never  Vaping Use   Vaping status: Never Used  Substance and Sexual Activity   Alcohol use: Not Currently   Drug use: No   Sexual activity: Not on file  Other Topics Concern   Not on file  Social History Narrative   Not on file   Social Drivers of Health   Financial Resource Strain: Low Risk  (06/07/2023)   Overall Financial Resource Strain (CARDIA)    Difficulty of Paying Living Expenses: Not hard at all  Food Insecurity: No Food Insecurity (06/07/2023)   Hunger Vital Sign    Worried About Running Out of Food in the Last Year: Never true    Ran Out of Food in the Last Year: Never true  Transportation Needs: No Transportation Needs (06/07/2023)   PRAPARE - Administrator, Civil Service (Medical): No    Lack of Transportation (Non-Medical): No  Physical Activity: Sufficiently Active (06/07/2023)   Exercise Vital Sign    Days of Exercise per Week: 3 days    Minutes of Exercise  per Session: 50 min  Stress: No Stress Concern Present (06/07/2023)   [employer] of Occupational Health - Occupational Stress Questionnaire    Feeling of Stress : Only a little  Social Connections: Socially Integrated (06/07/2023)   Social Connection and Isolation Panel    Frequency of Communication with Friends and Family: Twice a week    Frequency of Social Gatherings with Friends and Family: Once a week    Attends Religious Services: 1 to 4 times per year    Active Member of [employer] or Organizations: Yes    Attends Banker Meetings: 1 to 4 times per year    Marital Status: Married  Catering manager Violence: Not on file    Past Medical History, Surgical history, Social  history, and Family history were reviewed and updated as appropriate.   Please see review of systems for further details on the patient's review from today.   Objective:   Physical Exam:  There were no vitals taken for this visit.  Physical Exam Constitutional:      General: He is not in acute distress.  Musculoskeletal:        General: No deformity.   Neurological:     Mental Status: He is alert and oriented to person, place, and time.     Coordination: Coordination normal.   Psychiatric:        Attention and Perception: Attention and perception normal. He does not perceive auditory or visual hallucinations.        Mood and Affect: Mood normal. Mood is not anxious or depressed. Affect is not labile, blunt, angry or inappropriate.        Speech: Speech normal.        Behavior: Behavior normal.        Thought Content: Thought content normal. Thought content is not paranoid or delusional. Thought content does not include homicidal or suicidal ideation. Thought content does not include homicidal or suicidal plan.        Cognition and Memory: Cognition and memory normal.        Judgment: Judgment normal.     Comments: Insight intact     Lab Review:     Component Value Date/Time   NA 137  09/10/2023 2039   NA 143 09/05/2023 1629   K 4.4 09/10/2023 2039   CL 101 09/10/2023 2039   CO2 25 09/10/2023 2039   GLUCOSE 97 09/10/2023 2039   BUN 18 09/10/2023 2039   BUN 10 09/05/2023 1629   CREATININE 1.55 (H) 09/10/2023 2039   CALCIUM  9.7 09/10/2023 2039   PROT 6.8 09/10/2023 2039   PROT 6.2 09/05/2023 1629   ALBUMIN 3.6 09/10/2023 2039   ALBUMIN 3.5 (L) 09/05/2023 1629   AST 19 09/10/2023 2039   ALT <5 09/10/2023 2039   ALKPHOS 58 09/10/2023 2039   BILITOT 0.2 09/10/2023 2039   BILITOT 0.3 09/05/2023 1629   GFRNONAA 50 (L) 09/10/2023 2039   GFRAA 61 05/29/2020 0854       Component Value Date/Time   WBC 7.0 09/10/2023 2039   RBC 3.79 (L) 09/10/2023 2039   HGB 11.4 (L) 09/10/2023 2039   HGB 11.2 (L) 09/05/2023 1629   HCT 35.3 (L) 09/10/2023 2039   HCT 34.4 (L) 09/05/2023 1629   PLT 193 09/10/2023 2039   PLT 194 09/05/2023 1629   MCV 93.1 09/10/2023 2039   MCV 94 09/05/2023 1629   MCH 30.1 09/10/2023 2039   MCHC 32.3 09/10/2023 2039   RDW 14.6 09/10/2023 2039   RDW 15.1 09/05/2023 1629   LYMPHSABS 1.9 09/10/2023 2039   LYMPHSABS 1.6 09/05/2023 1629   MONOABS 0.6 09/10/2023 2039   EOSABS 0.1 09/10/2023 2039   EOSABS 0.1 09/05/2023 1629   BASOSABS 0.0 09/10/2023 2039   BASOSABS 0.0 09/05/2023 1629    Lithium  Lvl  Date Value Ref Range Status  06/09/2022 1.3 (HH) 0.5 - 1.2 mmol/L Final    Comment:    A concentration of 0.5-0.8 mmol/L is advised for long-term use; concentrations of up to 1.2 mmol/L may be necessary during acute treatment.  Detection Limit = 0.1                           <0.1 indicates None Detected Patient drug level exceeds published reference range.  Evaluate clinically for signs of potential toxicity.      Lab Results  Component Value Date   VALPROATE 79 04/20/2023     .res Assessment: Plan:    Plan:  PDMP reviewed  Continue:  Decrease Depakote  1000mg  BID will decrease dose by 500mg   daily.  Trazadone 100mg  - taking 1/2 tablet daily.  Neuropsych evaluation completed in April.  RTC 3/4 weeks   25 minutes spent dedicated to the care of this patient on the date of this encounter to include pre-visit review of records, ordering of medication, post visit documentation, and face-to-face time with the patient discussing Bipolar Disorder, GAD and insomnia. Discussed continuing current medication regimen.  Patient totally disabled an unable to work with current disabilities - mental health issues Bipolar disorder and recently diagnosed with Parkinson's with Lewy Body Dementia.   Discussed potential metabolic side effects associated with atypical antipsychotics, as well as potential risk for movement side effects. Advised pt to contact office if movement side effects occur.    Patient advised to contact office with any questions, adverse effects, or acute worsening in signs and symptoms.  There are no diagnoses linked to this encounter.   Please see After Visit Summary for patient specific instructions.  Future Appointments  Date Time Provider Department Center  10/09/2023  9:30 AM Gil Ingwersen, Angeline Mattocks, NP CP-CP None  10/18/2023 10:00 AM [facility] INJ 1 [facility]  10/18/2023  3:00 PM MC[facility]NM 2 [facility]  12/21/2023 11:10 AM Zollie Lowers, MD [facility] None  01/08/2024 10:30 AM Dohmeier, Dedra, MD [facility] None  02/14/2024  9:00 AM Corina Norleen SAUNDERS, PsyD CPR-PR[facility] CPR  02/15/2024 11:00 AM Corina Norleen SAUNDERS, PsyD [facility] CPR    No orders of the defined types were placed in this encounter.     -------------------------------

## 2023-10-17 ENCOUNTER — Ambulatory Visit: Payer: Self-pay | Admitting: Neurology

## 2023-10-18 ENCOUNTER — Ambulatory Visit (HOSPITAL_COMMUNITY)
Admission: RE | Admit: 2023-10-18 | Discharge: 2023-10-18 | Disposition: A | Discharge status: Home / Self Care | Source: Ambulatory Visit | Attending: Neurology | Admitting: Neurology

## 2023-10-18 ENCOUNTER — Encounter: Payer: Self-pay | Admitting: Family Medicine

## 2023-10-18 DIAGNOSIS — Z8659 Personal history of other mental and behavioral disorders: Secondary | ICD-10-CM | POA: Diagnosis present

## 2023-10-18 DIAGNOSIS — R251 Tremor, unspecified: Secondary | ICD-10-CM | POA: Diagnosis present

## 2023-10-18 DIAGNOSIS — F339 Major depressive disorder, recurrent, unspecified: Secondary | ICD-10-CM | POA: Diagnosis present

## 2023-10-18 DIAGNOSIS — R413 Other amnesia: Secondary | ICD-10-CM | POA: Diagnosis present

## 2023-10-18 MED ORDER — POTASSIUM IODIDE (ANTIDOTE) 130 MG PO TABS
ORAL_TABLET | ORAL | Status: AC
  Filled 2023-10-18: qty 1

## 2023-10-18 MED ORDER — IOFLUPANE I 123 185 MBQ/2.5ML IV SOLN
4.2200 | Freq: Once | INTRAVENOUS | Status: AC | PRN
  Administered 2023-10-18: 4.22 via INTRAVENOUS

## 2023-10-18 NOTE — Therapy (Unsigned)
 OUTPATIENT PHYSICAL THERAPY NEURO EVALUATION   Patient Name: Melvin Roberts MRN: [id number] DOB:28-Mar-1961, 63 y.o., male Today's Date: 10/19/2023   PCP: [name], MD REFERRING PROVIDER: [name], MD  END OF SESSION:  PT End of Session - 10/19/23 1310     Visit Number 1    Number of Visits 16    Date for PT Re-Evaluation 12/18/23    Authorization Type aetna; no VL    PT Start Time 1316    PT Stop Time 1400    PT Time Calculation (min) 44 min    Activity Tolerance Patient tolerated treatment well    Behavior During Therapy WFL for tasks assessed/performed          Past Medical History:  Diagnosis Date   Allergy    Bipolar 1 disorder (HCC)    GERD (gastroesophageal reflux disease)    Hyperlipidemia    Hypertension    Past Surgical History:  Procedure Laterality Date   BACK SURGERY  11/17/1995   Dr. Duwayne   COLONOSCOPY     EYE SURGERY     HERNIA REPAIR  04/19/2007   umbilical   UPPER GASTROINTESTINAL ENDOSCOPY  x2   w/ esophageal dilation   Patient Active Problem List   Diagnosis Date Noted   Tremor of both hands 04/20/2023   History of bipolar disorder 04/20/2023   Short-term memory loss 04/20/2023   Cognitive attention deficit 04/20/2023   Ataxia involving legs 04/20/2023   Upper respiratory tract infection 08/17/2020   Vitamin D  deficiency 04/15/2019   BMI 32.0-32.9,adult 04/15/2019   Hypertension 09/16/2013   Bipolar disorder (HCC) 03/11/2013   Allergic rhinitis 03/11/2013   BPH (benign prostatic hyperplasia) 03/11/2013   Hyperlipidemia 05/29/2007   Depression, recurrent (HCC) 05/29/2007   Sinusitis, chronic 05/29/2007   ESOPHAGEAL STRICTURE 05/29/2007   GERD 05/29/2007   HIATAL HERNIA 05/29/2007   LOW BACK PAIN, CHRONIC 05/29/2007    ONSET DATE: 10/02/23  REFERRING DIAG:  R25.1 (ICD-10-CM) - Tremor of both hands  R41.3 (ICD-10-CM) - Short-term memory loss  Z86.59 (ICD-10-CM) - History of bipolar disorder  F33.9 (ICD-10-CM)  - Depression, recurrent (HCC    THERAPY DIAG:  Other abnormalities of gait and mobility  Unsteadiness on feet  Other symptoms and signs involving the nervous system  Rationale for Evaluation and Treatment: Rehabilitation  SUBJECTIVE:                                                                                                                                                                                             SUBJECTIVE STATEMENT: Pt reports noticing a lot of balance issues, pt  reports that he used to walk a lot but is now needing to use Rollator for all mobility. Pt reports having tremors in B hands, R worse than L. Pt reports tremors impacting stability with holding cup, especially while walking across the room. Pt reports spilling things more often.  Pt accompanied by: significant other (spouse: [name])  PERTINENT HISTORY: parkinsonism, bipolar disorder, HTN, hyperlipidemia, knee surgeries 08/2022  PAIN:  Are you having pain? Yes: NPRS scale: mild-- can vary Pain location: low back, knees Pain description: varies Aggravating factors: weather Relieving factors: varies  PRECAUTIONS: Fall  WEIGHT BEARING RESTRICTIONS: No  FALLS: Has patient fallen in last 6 months? Yes. Number of falls 2, in the last month. He was falling daily until getting the rollator RW in the past few months.  LIVING ENVIRONMENT: Lives with: lives with their spouse Lives in: House/apartment Stairs: Yes: External: 5 steps; can uses a rail-- he doesn't get his foot on the step well and going up he catches his foot Has following equipment at home: Walker - 4 wheeled  PLOF: Independent , declining in past year  PATIENT GOALS: walk more steady (without device would be nice)  OBJECTIVE:  Note: Objective measures were completed at Evaluation unless otherwise noted. DIAGNOSTIC FINDINGS: DAT scan, NPH assessment  COGNITION: Overall cognitive status: Impaired; he started aricept , also notes hearing  declined significantly in last year   SENSATION: L hand  COORDINATION: RAM mildly ataxic, symmetrical with FTN and opposition  MUSCLE LENGTH: Hamstrings: tightness noted bilaterally  POSTURE: No Significant postural limitations  LOWER EXTREMITY ROM:    WFLs  LOWER EXTREMITY MMT:   MMT Right Eval Left Eval  Hip flexion 5/5 5/5  Hip extension    Hip abduction    Hip adduction    Hip internal rotation    Hip external rotation    Knee flexion 5/5 5/5  Knee extension 5/5 5/5  Ankle dorsiflexion 4-/5 5/5  Ankle plantarflexion    Ankle inversion    Ankle eversion    (Blank rows = not tested)  BED MOBILITY:  Findings: Sit to supine Complete Independence  TRANSFERS: Sit to stand: Complete Independence  Assistive device utilized: to rollator RW      STAIRS: TBA-- has 5 at home  GAIT: Findings: Gait Characteristics: decreased step length- Right, decreased step length- Left, decreased stride length, decreased ankle dorsiflexion- Right, decreased ankle dorsiflexion- Left, shuffling, poor foot clearance- Right, and poor foot clearance- Left, Distance walked: 100 ft, Assistive device utilized:Walker - 4 wheeled and None, Level of assistance: SBA, and Comments: Patient ambulates with frequent festinating patterns and reports at times freezing for 1 minute in the home Gait speed=1.28 ft/sec without device Gait speed=1.76 ft/sec with rollator  FUNCTIONAL TESTS:  5 times sit to stand: 22.92 Timed up and go (TUG): 25.22, TUG cognitive is 33.60 seconds with diminished accuracy in counting Berg Balance Scale:  Item Test date: 10/19/23 Test date:  Test date:   Sitting to standing 4. able to stand without using hands and stabilize independently Insert OPRCBERGREEVAL SmartPhrase at re-test date Insert OPRCBERGREEVAL SmartPhrase at re-test date  2. Standing unsupported 4. able to stand safely for 2 minutes    3. Sitting with back unsupported, feet supported 4. able to sit safely and securely  for 2 minutes    4. Standing to sitting 4. sits safely with minimal use of hands    5. Pivot transfer  4. able to transfer safely with minor use of hands  6. Standing unsupported with eyes closed 4. able to stand 10 seconds safely    7. Standing unsupported with feet together 3. able to place feet together independently and stand 1 minute with supervision    8. Reaching forward with outstretched arms while standing 2. can reach forward 5 cm (2 inches)    9. Pick up object from the floor from standing 3. able to pick up slipper but needs supervision    10. Turning to look behind over left and right shoulders while standing 2. turns sideways but only maintains balance    11. Turn 360 degrees 1. needs close supervision or verbal cuing    12. Place alternate foot on step or stool while standing unsupported 0. needs assistance to keep from falling/unable to try    13. Standing unsupported one foot in front 2. able to take small step independently and hold 30 seconds    14. Standing on one leg 1. tries to lift leg unable to hold 3 seconds but remains standing independently.      Total Score 38/56 Total Score x /56 Total Score x/56   Turning= 16 steps to the R and 22 steps to the L  PATIENT SURVEYS:  Freezing of gait questionnaire Not performed                                                                                                                              [facility] Adult PT Treatment:                                                DATE: 10/19/23 Gait: Discussed tips to reduce freezing and practiced in session. Tips to reduce freezing episodes with standing or walking:  Stand tall with your feet wide, so that you can rock and weight shift through your hips. Don't try to fight the freeze: if you begin taking slower, faster, smaller steps, STOP, get your posture tall, and RESET your posture and balance.  Take a deep breath before taking the BIG step to start again. March in place, with high  knee stepping, to get started walking again. Use auditory cues:  Count out loud, think of a familiar tune or song or cadence, use pocket metronome, to use rhythm to get started walking again. Use visual cues:  Use a line to step over, use laser pointer line to step over, (using BIG steps) to start walking again. Use visual targets to keep your posture tall (look ahead and focus on an object or target at eye level). As you approach where your destination with walking, count your steps out loud and/or focus on your target with your eyes until you are fully there. Use appropriate assistive device, as advised by your physical therapist to assist with taking longer, consistent steps.   PATIENT EDUCATION:  Education details: Lysle of PT, goals for therapy, tips to reduce freezing. Person educated: Patient and Spouse Education method: Explanation, Demonstration, and Handouts Education comprehension: verbalized understanding and returned demonstration  HOME EXERCISE PROGRAM: None established-- provided tips for freezing  GOALS: Goals reviewed with patient? Yes  SHORT TERM GOALS: Target date: 11/18/23  The patient will perform HEP with wife's assist. Baseline: initiated at eval Goal status: INITIAL  2.  The patient will improve gait speed to 2.5 ft/sec with rollater demonstrating dec'd festination. Baseline:  1.76 ft/sec Goal status: INITIAL  3.   The patient will improve Berg to > or equal to 42/56.  Baseline:  38/56 Goal status: INITIAL  4.  The patient will move floor<>stand with UE support due to h/o falls. Baseline:  PT to further assess Goal status: INITIAL  LONG TERM GOALS: Target date: 12/19/23  The patient will have progression of HEP with wife's assist Baseline:  initiated at eval Goal status: INITIAL  2.  The patient will improve freezing of gait by 5 points to demonstrate improved functional abilities. Baseline: to assess* Goal status: INITIAL  3.  The patient will  improve Berg to > or equal to 46/56. Baseline:  38/56 Goal status: INITIAL  4.  The patient will improve TUG score < or equal to 20 seconds. Baseline:  25.22 seconds Goal status: INITIAL  5.  The patient will improve 5 time sit<>stand to < 18 seconds. Baseline: 22.92 seconds Goal status: INITIAL  6.  The patient will report reduction in falls to < or equal to 2x/month. Baseline:  varies-- has been as much as daily to 2x/month this year Goal status: INITIAL  ASSESSMENT:  CLINICAL IMPRESSION: Patient is a 63 y.o. male who was seen today for physical therapy evaluation and treatment for parkinsonism, tremor, falls. He presents to physical therapy with impairments in coordination, motor control, gait, multi-tasking, and balance. PT to address deficits focusing on strategies to reduce freezing during gait, and improve movement patterns.   OBJECTIVE IMPAIRMENTS: Abnormal gait, decreased balance, decreased coordination, decreased knowledge of use of DME, decreased mobility, difficulty walking, increased fascial restrictions, impaired flexibility, postural dysfunction, and pain.   ACTIVITY LIMITATIONS: carrying, lifting, bending, standing, transfers, bed mobility, and locomotion level  PARTICIPATION LIMITATIONS: cleaning, personal finances, driving, community activity, and occupation  PERSONAL FACTORS: 3+ comorbidities: see above are also affecting patient's functional outcome.   REHAB POTENTIAL: Good  CLINICAL DECISION MAKING: Unstable/unpredictable  EVALUATION COMPLEXITY: High  PLAN:  PT FREQUENCY: 2x/week  PT DURATION: 8 weeks  PLANNED INTERVENTIONS: 97164- PT Re-evaluation, 97750- Physical Performance Testing, 97110-Therapeutic exercises, 97530- Therapeutic activity, [id number]- Neuromuscular re-education, 97535- Self Care, [id number]- Manual therapy, [phone]- Gait training, Patient/Family education, Balance training, Stair training, and DME instructions  PLAN FOR NEXT SESSION: Complete  freezing of gait questionairre  (spouse can), PWR! Exercises working initially on turns and reducing freezing during gait.  *adjust height of walker   Mackensey Bolte, PT 10/19/2023, 2:08 PM

## 2023-10-19 ENCOUNTER — Ambulatory Visit: Admitting: Occupational Therapy

## 2023-10-19 ENCOUNTER — Other Ambulatory Visit: Payer: Self-pay

## 2023-10-19 ENCOUNTER — Encounter: Payer: Self-pay | Admitting: Rehabilitative and Restorative Service Providers"

## 2023-10-19 ENCOUNTER — Encounter: Payer: Self-pay | Admitting: Neurology

## 2023-10-19 ENCOUNTER — Ambulatory Visit: Attending: Neurology | Admitting: Rehabilitative and Restorative Service Providers"

## 2023-10-19 DIAGNOSIS — R278 Other lack of coordination: Secondary | ICD-10-CM | POA: Insufficient documentation

## 2023-10-19 DIAGNOSIS — R293 Abnormal posture: Secondary | ICD-10-CM

## 2023-10-19 DIAGNOSIS — R2689 Other abnormalities of gait and mobility: Secondary | ICD-10-CM | POA: Diagnosis present

## 2023-10-19 DIAGNOSIS — R2681 Unsteadiness on feet: Secondary | ICD-10-CM

## 2023-10-19 DIAGNOSIS — R29818 Other symptoms and signs involving the nervous system: Secondary | ICD-10-CM

## 2023-10-19 DIAGNOSIS — R41840 Attention and concentration deficit: Secondary | ICD-10-CM | POA: Diagnosis present

## 2023-10-19 NOTE — Therapy (Signed)
 OUTPATIENT PHYSICAL THERAPY NEURO TREATMENT   Patient Name: Melvin Roberts MRN: [id number] DOB:03/22/1961, 63 y.o., male Today's Date: 10/23/2023   PCP: [name], MD REFERRING PROVIDER: [name], MD  END OF SESSION:  PT End of Session - 10/23/23 0844     Visit Number 2    Number of Visits 16    Date for PT Re-Evaluation 12/18/23    Authorization Type aetna; no VL    PT Start Time 0756    PT Stop Time 0844    PT Time Calculation (min) 48 min    Equipment Utilized During Treatment Gait belt    Activity Tolerance Patient tolerated treatment well    Behavior During Therapy WFL for tasks assessed/performed           Past Medical History:  Diagnosis Date   Allergy    Bipolar 1 disorder (HCC)    GERD (gastroesophageal reflux disease)    Hyperlipidemia    Hypertension    Past Surgical History:  Procedure Laterality Date   BACK SURGERY  11/17/1995   Dr. Duwayne   COLONOSCOPY     EYE SURGERY     HERNIA REPAIR  04/19/2007   umbilical   UPPER GASTROINTESTINAL ENDOSCOPY  x2   w/ esophageal dilation   Patient Active Problem List   Diagnosis Date Noted   Tremor of both hands 04/20/2023   History of bipolar disorder 04/20/2023   Short-term memory loss 04/20/2023   Cognitive attention deficit 04/20/2023   Ataxia involving legs 04/20/2023   Upper respiratory tract infection 08/17/2020   Vitamin D  deficiency 04/15/2019   BMI 32.0-32.9,adult 04/15/2019   Hypertension 09/16/2013   Bipolar disorder (HCC) 03/11/2013   Allergic rhinitis 03/11/2013   BPH (benign prostatic hyperplasia) 03/11/2013   Hyperlipidemia 05/29/2007   Depression, recurrent (HCC) 05/29/2007   Sinusitis, chronic 05/29/2007   ESOPHAGEAL STRICTURE 05/29/2007   GERD 05/29/2007   HIATAL HERNIA 05/29/2007   LOW BACK PAIN, CHRONIC 05/29/2007    ONSET DATE: 10/02/23  REFERRING DIAG:  R25.1 (ICD-10-CM) - Tremor of both hands  R41.3 (ICD-10-CM) - Short-term memory loss  Z86.59 (ICD-10-CM)  - History of bipolar disorder  F33.9 (ICD-10-CM) - Depression, recurrent (HCC    THERAPY DIAG:  Other symptoms and signs involving the nervous system  Unsteadiness on feet  Other abnormalities of gait and mobility  Rationale for Evaluation and Treatment: Rehabilitation  SUBJECTIVE:                                                                                                                                                                                             SUBJECTIVE  STATEMENT: Had a good holiday. Nothing news.   Pt accompanied by: significant other (spouse: [name])  PERTINENT HISTORY: parkinsonism, bipolar disorder, HTN, hyperlipidemia, knee surgeries 08/2022  PAIN:  Are you having pain? Yes: NPRS scale: no Pain location: low back, knees Pain description: varies Aggravating factors: weather Relieving factors: varies  PRECAUTIONS: Fall  WEIGHT BEARING RESTRICTIONS: No  FALLS: Has patient fallen in last 6 months? Yes. Number of falls 2, in the last month. He was falling daily until getting the rollator RW in the past few months.  LIVING ENVIRONMENT: Lives with: lives with their spouse Lives in: House/apartment Stairs: Yes: External: 5 steps; can uses a rail-- he doesn't get his foot on the step well and going up he catches his foot Has following equipment at home: Walker - 4 wheeled  PLOF: Independent , declining in past year  PATIENT GOALS: walk more steady (without device would be nice)  OBJECTIVE:      TODAY'S TREATMENT: 10/23/23 Activity Comments  FOG 17/24  sitting PWR moves: PREPARE and ACTIVATE up rock twist step Cueing for posture, positioning, eyes ahead. Pt with some difficulty coordinating UE/LE with PWR rock, required cueing and demo to correct.Difficulty isolating hip rotation with PWR twist.   Gait training with [phone] Cues to keep feet within walker, step towards walker and keep it closer to BOS, kick under walker seat for longer steps.  Great improvement in gait deviations      HOME EXERCISE PROGRAM Last updated: 10/23/23 Sitting PWR moves 2x10 each/day  PATIENT EDUCATION: Education details: sitting PWR moves handout as well as Geneticist, molecular  Person educated: Patient and Spouse Education method: Explanation, Demonstration, Tactile cues, Verbal cues, and Handouts Education comprehension: verbalized understanding and returned demonstration    Note: Objective measures were completed at Evaluation unless otherwise noted. DIAGNOSTIC FINDINGS: DAT scan, NPH assessment  COGNITION: Overall cognitive status: Impaired; he started aricept , also notes hearing declined significantly in last year   SENSATION: L hand  COORDINATION: RAM mildly ataxic, symmetrical with FTN and opposition  MUSCLE LENGTH: Hamstrings: tightness noted bilaterally  POSTURE: No Significant postural limitations  LOWER EXTREMITY ROM:    WFLs  LOWER EXTREMITY MMT:   MMT Right Eval Left Eval  Hip flexion 5/5 5/5  Hip extension    Hip abduction    Hip adduction    Hip internal rotation    Hip external rotation    Knee flexion 5/5 5/5  Knee extension 5/5 5/5  Ankle dorsiflexion 4-/5 5/5  Ankle plantarflexion    Ankle inversion    Ankle eversion    (Blank rows = not tested)  BED MOBILITY:  Findings: Sit to supine Complete Independence  TRANSFERS: Sit to stand: Complete Independence  Assistive device utilized: to rollator RW      STAIRS: TBA-- has 5 at home  GAIT: Findings: Gait Characteristics: decreased step length- Right, decreased step length- Left, decreased stride length, decreased ankle dorsiflexion- Right, decreased ankle dorsiflexion- Left, shuffling, poor foot clearance- Right, and poor foot clearance- Left, Distance walked: 100 ft, Assistive device utilized:Walker - 4 wheeled and None, Level of assistance: SBA, and Comments: Patient ambulates with frequent festinating patterns and reports at times freezing for 1 minute in  the home Gait speed=1.28 ft/sec without device Gait speed=1.76 ft/sec with rollator  FUNCTIONAL TESTS:  5 times sit to stand: 22.92 Timed up and go (TUG): 25.22, TUG cognitive is 33.60 seconds with diminished accuracy in counting Berg Balance Scale:  Item Test date: 10/19/23 Test date:  Test date:   Sitting to standing 4. able to stand without using hands and stabilize independently Insert OPRCBERGREEVAL SmartPhrase at re-test date Insert OPRCBERGREEVAL SmartPhrase at re-test date  2. Standing unsupported 4. able to stand safely for 2 minutes    3. Sitting with back unsupported, feet supported 4. able to sit safely and securely for 2 minutes    4. Standing to sitting 4. sits safely with minimal use of hands    5. Pivot transfer  4. able to transfer safely with minor use of hands    6. Standing unsupported with eyes closed 4. able to stand 10 seconds safely    7. Standing unsupported with feet together 3. able to place feet together independently and stand 1 minute with supervision    8. Reaching forward with outstretched arms while standing 2. can reach forward 5 cm (2 inches)    9. Pick up object from the floor from standing 3. able to pick up slipper but needs supervision    10. Turning to look behind over left and right shoulders while standing 2. turns sideways but only maintains balance    11. Turn 360 degrees 1. needs close supervision or verbal cuing    12. Place alternate foot on step or stool while standing unsupported 0. needs assistance to keep from falling/unable to try    13. Standing unsupported one foot in front 2. able to take small step independently and hold 30 seconds    14. Standing on one leg 1. tries to lift leg unable to hold 3 seconds but remains standing independently.      Total Score 38/56 Total Score x /56 Total Score x/56   Turning= 16 steps to the R and 22 steps to the L  PATIENT SURVEYS:  Freezing of gait questionnaire Not performed                                                                                                                               [facility] Adult PT Treatment:                                                DATE: 10/19/23 Gait: Discussed tips to reduce freezing and practiced in session. Tips to reduce freezing episodes with standing or walking:  Stand tall with your feet wide, so that you can rock and weight shift through your hips. Don't try to fight the freeze: if you begin taking slower, faster, smaller steps, STOP, get your posture tall, and RESET your posture and balance.  Take a deep breath before taking the BIG step to start again. March in place, with high knee stepping, to get started walking again. Use auditory cues:  Count out loud, think of a familiar tune or song or cadence, use pocket metronome, to use rhythm  to get started walking again. Use visual cues:  Use a line to step over, use laser pointer line to step over, (using BIG steps) to start walking again. Use visual targets to keep your posture tall (look ahead and focus on an object or target at eye level). As you approach where your destination with walking, count your steps out loud and/or focus on your target with your eyes until you are fully there. Use appropriate assistive device, as advised by your physical therapist to assist with taking longer, consistent steps.   PATIENT EDUCATION: Education details: Nature of PT, goals for therapy, tips to reduce freezing. Person educated: Patient and Spouse Education method: Explanation, Demonstration, and Handouts Education comprehension: verbalized understanding and returned demonstration  HOME EXERCISE PROGRAM: None established-- provided tips for freezing  GOALS: Goals reviewed with patient? Yes  SHORT TERM GOALS: Target date: 11/18/23  The patient will perform HEP with wife's assist. Baseline: initiated at eval Goal status: IN PROGRESS  2.  The patient will improve gait speed to 2.5 ft/sec with rollater  demonstrating dec'd festination. Baseline:  1.76 ft/sec Goal status: IN PROGRESS  3.   The patient will improve Berg to > or equal to 42/56.  Baseline:  38/56 Goal status: IN PROGRESS  4.  The patient will move floor<>stand with UE support due to h/o falls. Baseline:  PT to further assess Goal status: IN PROGRESS  LONG TERM GOALS: Target date: 12/19/23  The patient will have progression of HEP with wife's assist Baseline:  initiated at eval Goal status: IN PROGRESS  2.  The patient will improve freezing of gait by 5 points to demonstrate improved functional abilities. Baseline: 17/24 10/23/23 Goal status: IN PROGRESS  3.  The patient will improve Berg to > or equal to 46/56. Baseline:  38/56 Goal status: IN PROGRESS  4.  The patient will improve TUG score < or equal to 20 seconds. Baseline:  25.22 seconds Goal status: IN PROGRESS  5.  The patient will improve 5 time sit<>stand to < 18 seconds. Baseline: 22.92 seconds Goal status: IN PROGRESS  6.  The patient will report reduction in falls to < or equal to 2x/month. Baseline:  varies-- has been as much as daily to 2x/month this year Goal status: IN PROGRESS  ASSESSMENT:  CLINICAL IMPRESSION: Patient arrived to session without new complaints. Initiated PWR moves in sitting, providing cues for positioning, coordination, and activation of distal muscles. Some difficulty coordinating UE/LEs and required verbal/manual cueing and demo. Good improvement in gait deviations with pointed cueing. No complaints at end of session.   OBJECTIVE IMPAIRMENTS: Abnormal gait, decreased balance, decreased coordination, decreased knowledge of use of DME, decreased mobility, difficulty walking, increased fascial restrictions, impaired flexibility, postural dysfunction, and pain.   ACTIVITY LIMITATIONS: carrying, lifting, bending, standing, transfers, bed mobility, and locomotion level  PARTICIPATION LIMITATIONS: cleaning, personal finances,  driving, community activity, and occupation  PERSONAL FACTORS: 3+ comorbidities: see above are also affecting patient's functional outcome.   REHAB POTENTIAL: Good  CLINICAL DECISION MAKING: Unstable/unpredictable  EVALUATION COMPLEXITY: High  PLAN:  PT FREQUENCY: 2x/week  PT DURATION: 8 weeks  PLANNED INTERVENTIONS: 97164- PT Re-evaluation, 97750- Physical Performance Testing, 97110-Therapeutic exercises, 97530- Therapeutic activity, [id number]- Neuromuscular re-education, 97535- Self Care, 02859- Manual therapy, [id number]- Gait training, Patient/Family education, Balance training, Stair training, and DME instructions  PLAN FOR NEXT SESSION: PWR! Exercises working initially on turns and reducing freezing during gait.  *adjust height of walker   Louana Terrilyn Christians, [place], DPT 10/23/23  8:46 AM  [facility] at [facility] [address] [place], [address] [place], [place] [address] Phone # [phone] Fax # [phone]

## 2023-10-19 NOTE — Patient Instructions (Signed)
Tips to reduce freezing episodes with standing or walking:  Stand tall with your feet wide, so that you can rock and weight shift through your hips. Don't try to fight the freeze: if you begin taking slower, faster, smaller steps, STOP, get your posture tall, and RESET your posture and balance.  Take a deep breath before taking the BIG step to start again. March in place, with high knee stepping, to get started walking again. Use auditory cues:  Count out loud, think of a familiar tune or song or cadence, use pocket metronome, to use rhythm to get started walking again. Use visual cues:  Use a line to step over, use laser pointer line to step over, (using BIG steps) to start walking again. Use visual targets to keep your posture tall (look ahead and focus on an object or target at eye level). As you approach where your destination with walking, count your steps out loud and/or focus on your target with your eyes until you are fully there. Use appropriate assistive device, as advised by your physical therapist to assist with taking longer, consistent steps.  

## 2023-10-19 NOTE — Therapy (Signed)
 OUTPATIENT OCCUPATIONAL THERAPY [facility] EVALUATION  Patient Name: Melvin Roberts MRN: [id number] DOB:01-30-1961, 63 y.o., male Today's Date: 10/19/2023  PCP: Zollie Lowers, MD REFERRING PROVIDER: Dohmeier, Dedra, MD  END OF SESSION:  OT End of Session - 10/19/23 0900     Visit Number 1    Number of Visits 13    Date for OT Re-Evaluation 12/15/23    Authorization Type Aetna [id number]    OT Start Time 0802    OT Stop Time 0846    OT Time Calculation (min) 44 min          Past Medical History:  Diagnosis Date   Allergy    Bipolar 1 disorder (HCC)    GERD (gastroesophageal reflux disease)    Hyperlipidemia    Hypertension    Past Surgical History:  Procedure Laterality Date   BACK SURGERY  11/17/1995   Dr. Duwayne   COLONOSCOPY     EYE SURGERY     HERNIA REPAIR  04/19/2007   umbilical   UPPER GASTROINTESTINAL ENDOSCOPY  x2   w/ esophageal dilation   Patient Active Problem List   Diagnosis Date Noted   Tremor of both hands 04/20/2023   History of bipolar disorder 04/20/2023   Short-term memory loss 04/20/2023   Cognitive attention deficit 04/20/2023   Ataxia involving legs 04/20/2023   Upper respiratory tract infection 08/17/2020   Vitamin D  deficiency 04/15/2019   BMI 32.0-32.9,adult 04/15/2019   Hypertension 09/16/2013   Bipolar disorder (HCC) 03/11/2013   Allergic rhinitis 03/11/2013   BPH (benign prostatic hyperplasia) 03/11/2013   Hyperlipidemia 05/29/2007   Depression, recurrent (HCC) 05/29/2007   Sinusitis, chronic 05/29/2007   ESOPHAGEAL STRICTURE 05/29/2007   GERD 05/29/2007   HIATAL HERNIA 05/29/2007   LOW BACK PAIN, CHRONIC 05/29/2007    ONSET DATE: referral date 10/02/23  REFERRING DIAG: R25.1 (ICD-10-CM) - Tremor of both hands R41.3 (ICD-10-CM) - Short-term memory loss Z86.59 (ICD-10-CM) - History of bipolar disorder F33.9 (ICD-10-CM) - Depression, recurrent  THERAPY DIAG:  Other symptoms and signs involving the nervous  system  Abnormal posture  Attention and concentration deficit  Other lack of coordination  Unsteadiness on feet  Rationale for Evaluation and Treatment: Rehabilitation  SUBJECTIVE:   SUBJECTIVE STATEMENT: Pt reports noticing a lot of balance issues, pt reports that he used to walk a lot but is now needing to use Rollator for all mobility.  Pt reports having tremors in B hands, R worse than L.  Pt reports tremors impacting stability with holding cup, especially while walking across the room.  Pt reports spilling things more often. Pt accompanied by: self and significant other (spouse [name])  PERTINENT HISTORY: parkinsonism, bipolar disorder, HTN, hyperlipidemia  PRECAUTIONS: Fall  WEIGHT BEARING RESTRICTIONS: No  PAIN:  Are you having pain? No  FALLS: Has patient fallen in last 6 months? Yes. Number of falls 6-8, but less since using Rollator end of May  LIVING ENVIRONMENT: Lives with: lives with their spouse Lives in: Mobile home Stairs: Yes: External: 4-5 steps; can reach both Has following equipment at home: Single point cane, Walker - 4 wheeled, shower chair, and bed assist rail  PLOF: Independent and Independent with basic ADLs  PATIENT GOALS: better balance to walk better  OBJECTIVE:  Note: Objective measures were completed at Evaluation unless otherwise noted.  HAND DOMINANCE: Right  ADLs: Overall ADLs: increased time for self-care tasks, about twice as long as before  Transfers/ambulation related to ADLs: Eating: increased spillage when eating and carrying cup,  uses larger spoon Grooming: Mod I UB Dressing: Mod I LB Dressing: occasional difficulty with tying shoes, is now wearing [employer] slip ins, increased time with donning socks Toileting: Mod I Bathing: Mod I Tub Shower transfers: Mod I Equipment: Shower seat with back and Walk in shower  IADLs: Light housekeeping: used to load and unload dishwasher but has not been feeling up to it Meal Prep:  will occasionally make a sandwich Community mobility: not driving as pt would veer to right when driving Medication management: spouse fills pill box Handwriting: 75% legible, Mild micrographia, and PPT#1 handwriting (whales live in [place]): 17.87 seconds. Pt writing in all caps when writing in print.  Mild micrographia, but remained same size from start to finish.  MOBILITY STATUS: wide shuffling gait, he has noted some freezing - as if he has to tell his body to move and its doesn't do what he wants.  POSTURE COMMENTS:  Mild Right lean  ACTIVITY TOLERANCE: Activity tolerance: diminished  FUNCTIONAL OUTCOME MEASURES: Physical performance test: PPT#2 (simulated eating) 14.32 sec - 1 drop  COORDINATION: Finger Nose Finger test: tremors noted bilaterally in fingers and forearm, however not impacting motor control.  Slower overall bilaterally 9 Hole Peg test: Right: 30.81 sec; Left: 39.18 sec Box and Blocks:  Right 52 blocks, Left 50 blocks RAM: decreased quality of movement, requiring tactile cues of thighs  UE ROM:  WFL, B shoulder limited ~140-150*  UE MMT:   WFL  SENSATION: Reports occasional tingling in fingers on L hand  COGNITION: Overall cognitive status: Within functional limits for tasks assessed and pt reports memory is not as good as it was before, also difficulty with dual tasking  OBSERVATIONS: Bradykinesia                                                                                                                    TREATMENT DATE:  10/19/23 Educated on large amplitude movements to aid in increased quality of movements with fine and gross motor tasks.      PATIENT EDUCATION: Education details: Educated on role and purpose of OT as well as potential interventions and goals for therapy based on initial evaluation findings. Person educated: Patient and Spouse Education method: Explanation Education comprehension: verbalized understanding and needs further  education  HOME EXERCISE PROGRAM: TBD  GOALS: Goals reviewed with patient? Yes  SHORT TERM GOALS: Target date: 11/10/23  Pt will be Independent with PD specific HEP Baseline: new to OPOT Goal status: INITIAL  2.  Pt will verbalize understanding of task modifications, adapted strategies, and/or potential DME/AE needs to increase ease, safety, and independence w/ ADLs/IADLs. Baseline: not engaging in IADLs at previous level, and increased time for ADLs Goal status: INITIAL  3.  Pt will demonstrate improved fine motor coordination for ADLs as evidenced by decreasing 9 hole peg test score for RUE/LUE by 5 secs Baseline: 9 Hole Peg test: Right: 30.81 sec; Left: 39.18 sec Goal status: INITIAL   LONG TERM GOALS: Target date: 12/15/23  Pt will demonstrate improved ease with feeding as evidenced by decreasing PPT#2 (self feeding) by 3 secs Baseline: 14.32 sec Goal status: INITIAL  2.  Pt will demonstrate improved UE functional use for ADLs as evidenced by increasing box/ blocks score by 5 blocks with RUE/LUE Baseline: R: 52 and L: 50 Goal status: INITIAL  3.  Pt will demonstrate understanding of memory compensations and ways to keep thinking skills sharp Baseline: impaired memory Goal status: INITIAL  4.  Pt will demonstrate ability to walk 20' while carrying cup without any spillage. Baseline: spilling cup of water with ambulation Goal status: INITIAL  ASSESSMENT:  CLINICAL IMPRESSION: Patient is a 63 y.o. male who was seen today for occupational therapy evaluation for parkinsonism with tremors, gait and memory impairments. Pt with resting tremors in BUE and demonstrating slowing bilaterally with fine and gross motor tasks compared to norms as well as reports of decreased quality as spilling foods/drinks when eating and overall slowing with dressing tasks.  Pt will benefit from skilled occupational therapy services to address strength and coordination,  balance, GM/FM control,  cognition, safety awareness, introduction of compensatory strategies/AE prn, and implementation of an HEP to improve participation and safety during ADLs, IADLs, and quality of life.    PERFORMANCE DEFICITS: in functional skills including ADLs, IADLs, coordination, dexterity, sensation, ROM, strength, Fine motor control, Gross motor control, balance, body mechanics, endurance, decreased knowledge of precautions, decreased knowledge of use of DME, and UE functional use, cognitive skills including memory and safety awareness, and psychosocial skills including coping strategies, environmental adaptation, and routines and behaviors.   IMPAIRMENTS: are limiting patient from ADLs, IADLs, and leisure.   COMORBIDITIES:  may have co-morbidities  that affects occupational performance. Patient will benefit from skilled OT to address above impairments and improve overall function.  MODIFICATION OR ASSISTANCE TO COMPLETE EVALUATION: No modification of tasks or assist necessary to complete an evaluation.  OT OCCUPATIONAL PROFILE AND HISTORY: Problem focused assessment: Including review of records relating to presenting problem.  CLINICAL DECISION MAKING: LOW - limited treatment options, no task modification necessary  REHAB POTENTIAL: Good  EVALUATION COMPLEXITY: Low    PLAN:  OT FREQUENCY: 1-2x/week  OT DURATION: 6 weeks (asking 8 weeks for scheduling)  PLANNED INTERVENTIONS: 02831 OT Re-evaluation, 97535 self care/ADL training, 02889 therapeutic exercise, 97530 therapeutic activity, 97112 neuromuscular re-education, 97750 Physical Performance Testing, balance training, functional mobility training, psychosocial skills training, energy conservation, coping strategies training, patient/family education, and DME and/or AE instructions  RECOMMENDED OTHER SERVICES: PT  CONSULTED AND AGREED WITH PLAN OF CARE: Patient and family member/caregiver  PLAN FOR NEXT SESSION: initiate PD HEP with focus on  large amplitude movements, Coordination HEP, educate on memory/thinking skills, attempt dual tasking with seated progressing to standing and ambulating   Lynise Porr, OTR/L 10/19/2023, 9:02 AM  [facility] at [facility] [address] [place], [place] [address] Phone # [phone] Fax # [phone]

## 2023-10-23 ENCOUNTER — Ambulatory Visit: Payer: Self-pay | Admitting: Neurology

## 2023-10-23 ENCOUNTER — Ambulatory Visit: Admitting: Physical Therapy

## 2023-10-23 ENCOUNTER — Encounter: Payer: Self-pay | Admitting: Physical Therapy

## 2023-10-23 ENCOUNTER — Ambulatory Visit: Payer: BC Managed Care – PPO | Admitting: Neurology

## 2023-10-23 DIAGNOSIS — R2689 Other abnormalities of gait and mobility: Secondary | ICD-10-CM | POA: Diagnosis not present

## 2023-10-23 DIAGNOSIS — R29818 Other symptoms and signs involving the nervous system: Secondary | ICD-10-CM

## 2023-10-23 DIAGNOSIS — R2681 Unsteadiness on feet: Secondary | ICD-10-CM

## 2023-10-23 NOTE — Patient Instructions (Signed)
 Scroll down on this website. Watch the first 2 videos in the sitting videos.   [email or web address]

## 2023-10-25 ENCOUNTER — Ambulatory Visit: Admitting: Physical Therapy

## 2023-10-25 LAB — CULTURE, FUNGUS WITHOUT SMEAR

## 2023-10-26 ENCOUNTER — Other Ambulatory Visit: Payer: Self-pay | Admitting: Family Medicine

## 2023-10-26 DIAGNOSIS — M5430 Sciatica, unspecified side: Secondary | ICD-10-CM

## 2023-10-31 ENCOUNTER — Ambulatory Visit

## 2023-10-31 DIAGNOSIS — R293 Abnormal posture: Secondary | ICD-10-CM

## 2023-10-31 DIAGNOSIS — R2681 Unsteadiness on feet: Secondary | ICD-10-CM

## 2023-10-31 DIAGNOSIS — R29818 Other symptoms and signs involving the nervous system: Secondary | ICD-10-CM

## 2023-10-31 DIAGNOSIS — R2689 Other abnormalities of gait and mobility: Secondary | ICD-10-CM

## 2023-10-31 NOTE — Therapy (Signed)
 OUTPATIENT PHYSICAL THERAPY NEURO TREATMENT   Patient Name: Melvin Roberts MRN: [id number] DOB:07/13/1960, 63 y.o., male Today's Date: 10/31/2023   PCP: [name], MD REFERRING PROVIDER: [name], MD  END OF SESSION:  PT End of Session - 10/31/23 0759     Visit Number 3    Number of Visits 16    Date for PT Re-Evaluation 12/18/23    Authorization Type aetna; no VL    PT Start Time 0800    PT Stop Time 0845    PT Time Calculation (min) 45 min    Equipment Utilized During Treatment Gait belt    Activity Tolerance Patient tolerated treatment well    Behavior During Therapy WFL for tasks assessed/performed           Past Medical History:  Diagnosis Date   Allergy    Bipolar 1 disorder (HCC)    GERD (gastroesophageal reflux disease)    Hyperlipidemia    Hypertension    Past Surgical History:  Procedure Laterality Date   BACK SURGERY  11/17/1995   Dr. Duwayne   COLONOSCOPY     EYE SURGERY     HERNIA REPAIR  04/19/2007   umbilical   UPPER GASTROINTESTINAL ENDOSCOPY  x2   w/ esophageal dilation   Patient Active Problem List   Diagnosis Date Noted   Tremor of both hands 04/20/2023   History of bipolar disorder 04/20/2023   Short-term memory loss 04/20/2023   Cognitive attention deficit 04/20/2023   Ataxia involving legs 04/20/2023   Upper respiratory tract infection 08/17/2020   Vitamin D  deficiency 04/15/2019   BMI 32.0-32.9,adult 04/15/2019   Hypertension 09/16/2013   Bipolar disorder (HCC) 03/11/2013   Allergic rhinitis 03/11/2013   BPH (benign prostatic hyperplasia) 03/11/2013   Hyperlipidemia 05/29/2007   Depression, recurrent (HCC) 05/29/2007   Sinusitis, chronic 05/29/2007   ESOPHAGEAL STRICTURE 05/29/2007   GERD 05/29/2007   HIATAL HERNIA 05/29/2007   LOW BACK PAIN, CHRONIC 05/29/2007    ONSET DATE: 10/02/23  REFERRING DIAG:  R25.1 (ICD-10-CM) - Tremor of both hands  R41.3 (ICD-10-CM) - Short-term memory loss  Z86.59  (ICD-10-CM) - History of bipolar disorder  F33.9 (ICD-10-CM) - Depression, recurrent (HCC    THERAPY DIAG:  Other symptoms and signs involving the nervous system  Unsteadiness on feet  Other abnormalities of gait and mobility  Abnormal posture  Rationale for Evaluation and Treatment: Rehabilitation  SUBJECTIVE:  SUBJECTIVE STATEMENT: Try to walk everyday. Go to the airport when the weather is bad to walk indoors.   Pt accompanied by: significant other (spouse: Melvin Roberts)  PERTINENT HISTORY: parkinsonism, bipolar disorder, HTN, hyperlipidemia, knee surgeries 08/2022  PAIN:  Are you having pain? Yes: NPRS scale: no Pain location: low back, knees Pain description: varies Aggravating factors: weather Relieving factors: varies  PRECAUTIONS: Fall  WEIGHT BEARING RESTRICTIONS: No  FALLS: Has patient fallen in last 6 months? Yes. Number of falls 2, in the last month. He was falling daily until getting the rollator RW in the past few months.  LIVING ENVIRONMENT: Lives with: lives with their spouse Lives in: House/apartment Stairs: Yes: External: 5 steps; can uses a rail-- he doesn't get his foot on the step well and going up he catches his foot Has following equipment at home: Walker - 4 wheeled  PLOF: Independent , declining in past year  PATIENT GOALS: walk more steady (without device would be nice)  OBJECTIVE:    TODAY'S TREATMENT: 10/31/23 Activity Comments  Seated and standing PWR moves 1x10 reps w/ slow rehearsal 1x10 reps w/ speed/flow -consistent cues for standing sequence  Turning strategies -4's: Stop, Sigh, Shift, Step -clock face turns -counted steps -visual cues                  TODAY'S TREATMENT: 10/23/23 Activity Comments  FOG 17/24  sitting PWR moves: PREPARE  and ACTIVATE up rock twist step Cueing for posture, positioning, eyes ahead. Pt with some difficulty coordinating UE/LE with PWR rock, required cueing and demo to correct.Difficulty isolating hip rotation with PWR twist.   Gait training with [phone] Cues to keep feet within walker, step towards walker and keep it closer to BOS, kick under walker seat for longer steps. Great improvement in gait deviations      HOME EXERCISE PROGRAM Last updated: 10/23/23 Sitting PWR moves 2x10 each/day  PATIENT EDUCATION: Education details: sitting PWR moves handout as well as Geneticist, molecular  Person educated: Patient and Spouse Education method: Explanation, Demonstration, Tactile cues, Verbal cues, and Handouts Education comprehension: verbalized understanding and returned demonstration    Note: Objective measures were completed at Evaluation unless otherwise noted. DIAGNOSTIC FINDINGS: DAT scan, NPH assessment  COGNITION: Overall cognitive status: Impaired; he started aricept , also notes hearing declined significantly in last year   SENSATION: L hand  COORDINATION: RAM mildly ataxic, symmetrical with FTN and opposition  MUSCLE LENGTH: Hamstrings: tightness noted bilaterally  POSTURE: No Significant postural limitations  LOWER EXTREMITY ROM:    WFLs  LOWER EXTREMITY MMT:   MMT Right Eval Left Eval  Hip flexion 5/5 5/5  Hip extension    Hip abduction    Hip adduction    Hip internal rotation    Hip external rotation    Knee flexion 5/5 5/5  Knee extension 5/5 5/5  Ankle dorsiflexion 4-/5 5/5  Ankle plantarflexion    Ankle inversion    Ankle eversion    (Blank rows = not tested)  BED MOBILITY:  Findings: Sit to supine Complete Independence  TRANSFERS: Sit to stand: Complete Independence  Assistive device utilized: to rollator RW      STAIRS: TBA-- has 5 at home  GAIT: Findings: Gait Characteristics: decreased step length- Right, decreased step length- Left, decreased  stride length, decreased ankle dorsiflexion- Right, decreased ankle dorsiflexion- Left, shuffling, poor foot clearance- Right, and poor foot clearance- Left, Distance walked: 100 ft, Assistive device utilized:Walker - 4 wheeled and None, Level of assistance: SBA,  and Comments: Patient ambulates with frequent festinating patterns and reports at times freezing for 1 minute in the home Gait speed=1.28 ft/sec without device Gait speed=1.76 ft/sec with rollator  FUNCTIONAL TESTS:  5 times sit to stand: 22.92 Timed up and go (TUG): 25.22, TUG cognitive is 33.60 seconds with diminished accuracy in counting Berg Balance Scale:  Item Test date: 10/19/23 Test date:  Test date:   Sitting to standing 4. able to stand without using hands and stabilize independently Insert OPRCBERGREEVAL SmartPhrase at re-test date Insert OPRCBERGREEVAL SmartPhrase at re-test date  2. Standing unsupported 4. able to stand safely for 2 minutes    3. Sitting with back unsupported, feet supported 4. able to sit safely and securely for 2 minutes    4. Standing to sitting 4. sits safely with minimal use of hands    5. Pivot transfer  4. able to transfer safely with minor use of hands    6. Standing unsupported with eyes closed 4. able to stand 10 seconds safely    7. Standing unsupported with feet together 3. able to place feet together independently and stand 1 minute with supervision    8. Reaching forward with outstretched arms while standing 2. can reach forward 5 cm (2 inches)    9. Pick up object from the floor from standing 3. able to pick up slipper but needs supervision    10. Turning to look behind over left and right shoulders while standing 2. turns sideways but only maintains balance    11. Turn 360 degrees 1. needs close supervision or verbal cuing    12. Place alternate foot on step or stool while standing unsupported 0. needs assistance to keep from falling/unable to try    13. Standing unsupported one foot in front  2. able to take small step independently and hold 30 seconds    14. Standing on one leg 1. tries to lift leg unable to hold 3 seconds but remains standing independently.      Total Score 38/56 Total Score x /56 Total Score x/56   Turning= 16 steps to the R and 22 steps to the L  PATIENT SURVEYS:  Freezing of gait questionnaire Not performed                                                                                                                              [facility] Adult PT Treatment:                                                DATE: 10/19/23 Gait: Discussed tips to reduce freezing and practiced in session. Tips to reduce freezing episodes with standing or walking:  Stand tall with your feet wide, so that you can rock and weight shift through your hips. Don't try to fight the freeze:  if you begin taking slower, faster, smaller steps, STOP, get your posture tall, and RESET your posture and balance.  Take a deep breath before taking the BIG step to start again. March in place, with high knee stepping, to get started walking again. Use auditory cues:  Count out loud, think of a familiar tune or song or cadence, use pocket metronome, to use rhythm to get started walking again. Use visual cues:  Use a line to step over, use laser pointer line to step over, (using BIG steps) to start walking again. Use visual targets to keep your posture tall (look ahead and focus on an object or target at eye level). As you approach where your destination with walking, count your steps out loud and/or focus on your target with your eyes until you are fully there. Use appropriate assistive device, as advised by your physical therapist to assist with taking longer, consistent steps.   PATIENT EDUCATION: Education details: Nature of PT, goals for therapy, tips to reduce freezing. Person educated: Patient and Spouse Education method: Explanation, Demonstration, and Handouts Education comprehension: verbalized  understanding and returned demonstration  HOME EXERCISE PROGRAM: None established-- provided tips for freezing  GOALS: Goals reviewed with patient? Yes  SHORT TERM GOALS: Target date: 11/18/23  The patient will perform HEP with wife's assist. Baseline: initiated at eval Goal status: IN PROGRESS  2.  The patient will improve gait speed to 2.5 ft/sec with rollater demonstrating dec'd festination. Baseline:  1.76 ft/sec Goal status: IN PROGRESS  3.   The patient will improve Berg to > or equal to 42/56.  Baseline:  38/56 Goal status: IN PROGRESS  4.  The patient will move floor<>stand with UE support due to h/o falls. Baseline:  PT to further assess Goal status: IN PROGRESS  LONG TERM GOALS: Target date: 12/19/23  The patient will have progression of HEP with wife's assist Baseline:  initiated at eval Goal status: IN PROGRESS  2.  The patient will improve freezing of gait by 5 points to demonstrate improved functional abilities. Baseline: 17/24 10/23/23 Goal status: IN PROGRESS  3.  The patient will improve Berg to > or equal to 46/56. Baseline:  38/56 Goal status: IN PROGRESS  4.  The patient will improve TUG score < or equal to 20 seconds. Baseline:  25.22 seconds Goal status: IN PROGRESS  5.  The patient will improve 5 time sit<>stand to < 18 seconds. Baseline: 22.92 seconds Goal status: IN PROGRESS  6.  The patient will report reduction in falls to < or equal to 2x/month. Baseline:  varies-- has been as much as daily to 2x/month this year Goal status: IN PROGRESS  ASSESSMENT:  CLINICAL IMPRESSION: Demo good performance and recall to seated large amplitude movements progressing to faster pace and flowing form. Initiated standing large amplitude movements to improve coordination and balance with initial trial w/ slow visual rehearsal and 2nd set with faster pace and flow but requiring frequent correction and attention to sequence.  Freezing of gait strategies to  improve safety with turning and facilitate control w/ best practice using visual imaging. Provided reference materials for festinating and freezing of gait. Continued sessions to progress POC details to improve mobility and reduce risk for falls  OBJECTIVE IMPAIRMENTS: Abnormal gait, decreased balance, decreased coordination, decreased knowledge of use of DME, decreased mobility, difficulty walking, increased fascial restrictions, impaired flexibility, postural dysfunction, and pain.   ACTIVITY LIMITATIONS: carrying, lifting, bending, standing, transfers, bed mobility, and locomotion level  PARTICIPATION LIMITATIONS:  cleaning, personal finances, driving, community activity, and occupation  PERSONAL FACTORS: 3+ comorbidities: see above are also affecting patient's functional outcome.   REHAB POTENTIAL: Good  CLINICAL DECISION MAKING: Unstable/unpredictable  EVALUATION COMPLEXITY: High  PLAN:  PT FREQUENCY: 2x/week  PT DURATION: 8 weeks  PLANNED INTERVENTIONS: 97164- PT Re-evaluation, 97750- Physical Performance Testing, 97110-Therapeutic exercises, 97530- Therapeutic activity, [id number]- Neuromuscular re-education, 97535- Self Care, [id number]- Manual therapy, [phone]- Gait training, Patient/Family education, Balance training, Stair training, and DME instructions  PLAN FOR NEXT SESSION: PWR! Exercises working initially on turns and reducing freezing during gait.  *adjust height of walker   8:45 AM, 10/31/23 M. Kelly Kayveon Lennartz, PT, DPT Physical Therapist- [place] Office Number: [phone]

## 2023-11-02 ENCOUNTER — Ambulatory Visit

## 2023-11-02 DIAGNOSIS — R293 Abnormal posture: Secondary | ICD-10-CM

## 2023-11-02 DIAGNOSIS — R29818 Other symptoms and signs involving the nervous system: Secondary | ICD-10-CM

## 2023-11-02 DIAGNOSIS — R2689 Other abnormalities of gait and mobility: Secondary | ICD-10-CM

## 2023-11-02 DIAGNOSIS — R2681 Unsteadiness on feet: Secondary | ICD-10-CM

## 2023-11-02 NOTE — Therapy (Signed)
 OUTPATIENT PHYSICAL THERAPY NEURO TREATMENT   Patient Name: Melvin Roberts MRN: [id number] DOB:[birth year], 63 y.o., male Today's Date: 11/02/2023   PCP: [name], MD REFERRING PROVIDER: [name], MD  END OF SESSION:  PT End of Session - 11/02/23 1106     Visit Number 4    Number of Visits 16    Date for PT Re-Evaluation 12/18/23    Authorization Type aetna; no VL    PT Start Time 1102    PT Stop Time 1145    PT Time Calculation (min) 43 min    Equipment Utilized During Treatment Gait belt    Activity Tolerance Patient tolerated treatment well    Behavior During Therapy WFL for tasks assessed/performed           Past Medical History:  Diagnosis Date   Allergy    Bipolar 1 disorder (HCC)    GERD (gastroesophageal reflux disease)    Hyperlipidemia    Hypertension    Past Surgical History:  Procedure Laterality Date   BACK SURGERY  11/17/1995   Dr. Duwayne   COLONOSCOPY     EYE SURGERY     HERNIA REPAIR  04/19/2007   umbilical   UPPER GASTROINTESTINAL ENDOSCOPY  x2   w/ esophageal dilation   Patient Active Problem List   Diagnosis Date Noted   Tremor of both hands 04/20/2023   History of bipolar disorder 04/20/2023   Short-term memory loss 04/20/2023   Cognitive attention deficit 04/20/2023   Ataxia involving legs 04/20/2023   Upper respiratory tract infection 08/17/2020   Vitamin D  deficiency 04/15/2019   BMI 32.0-32.9,adult 04/15/2019   Hypertension 09/16/2013   Bipolar disorder (HCC) 03/11/2013   Allergic rhinitis 03/11/2013   BPH (benign prostatic hyperplasia) 03/11/2013   Hyperlipidemia 05/29/2007   Depression, recurrent (HCC) 05/29/2007   Sinusitis, chronic 05/29/2007   ESOPHAGEAL STRICTURE 05/29/2007   GERD 05/29/2007   HIATAL HERNIA 05/29/2007   LOW BACK PAIN, CHRONIC 05/29/2007    ONSET DATE: 10/02/23  REFERRING DIAG:  R25.1 (ICD-10-CM) - Tremor of both hands  R41.3 (ICD-10-CM) - Short-term memory loss  Z86.59  (ICD-10-CM) - History of bipolar disorder  F33.9 (ICD-10-CM) - Depression, recurrent (HCC    THERAPY DIAG:  Other symptoms and signs involving the nervous system  Unsteadiness on feet  Other abnormalities of gait and mobility  Abnormal posture  Rationale for Evaluation and Treatment: Rehabilitation  SUBJECTIVE:  SUBJECTIVE STATEMENT: Had a bad day with walking yesterday, a lot of shuffling  Pt accompanied by: significant other (spouse: [name])  PERTINENT HISTORY: parkinsonism, bipolar disorder, HTN, hyperlipidemia, knee surgeries 08/2022  PAIN:  Are you having pain? Yes: NPRS scale: no Pain location: low back, knees Pain description: varies Aggravating factors: weather Relieving factors: varies  PRECAUTIONS: Fall  WEIGHT BEARING RESTRICTIONS: No  FALLS: Has patient fallen in last 6 months? Yes. Number of falls 2, in the last month. He was falling daily until getting the rollator RW in the past few months.  LIVING ENVIRONMENT: Lives with: lives with their spouse Lives in: House/apartment Stairs: Yes: External: 5 steps; can uses a rail-- he doesn't get his foot on the step well and going up he catches his foot Has following equipment at home: Walker - 4 wheeled  PLOF: Independent , declining in past year  PATIENT GOALS: walk more steady (without device would be nice)  OBJECTIVE:   TODAY'S TREATMENT: 11/02/23 Activity Comments  NU-step speed intervals x 8 min 2 min warm-up 30 sec speed; 60 sec recovery for rapid alternating movement  Education on Pedaling for Parkinson's   4 square step 3x60 sec w/ canes then added 6 hurdles to lateral step--difficulty with foot clearance for backwards/hip extension--trials of focused practice for hip extension--minimal carryover to 4-square step   Gait training -trials of resisted walk (blue band)--seems to have increased shuffling/festinating as velocity was increased after resistance removed -high step march w/ and without visual target  Balance/postural control On firm/foam EO/EC. perturbations         TODAY'S TREATMENT: 10/31/23 Activity Comments  Seated and standing PWR moves 1x10 reps w/ slow rehearsal 1x10 reps w/ speed/flow -consistent cues for standing sequence  Turning strategies -4's: Stop, Sigh, Shift, Step -clock face turns -counted steps -visual cues                  TODAY'S TREATMENT: 10/23/23 Activity Comments  FOG 17/24  sitting PWR moves: PREPARE and ACTIVATE up rock twist step Cueing for posture, positioning, eyes ahead. Pt with some difficulty coordinating UE/LE with PWR rock, required cueing and demo to correct.Difficulty isolating hip rotation with PWR twist.   Gait training with [phone] Cues to keep feet within walker, step towards walker and keep it closer to BOS, kick under walker seat for longer steps. Great improvement in gait deviations      HOME EXERCISE PROGRAM Last updated: 10/23/23 Sitting PWR moves 2x10 each/day  PATIENT EDUCATION: Education details: sitting PWR moves handout as well as Geneticist, molecular  Person educated: Patient and Spouse Education method: Explanation, Demonstration, Tactile cues, Verbal cues, and Handouts Education comprehension: verbalized understanding and returned demonstration    Note: Objective measures were completed at Evaluation unless otherwise noted. DIAGNOSTIC FINDINGS: DAT scan, NPH assessment  COGNITION: Overall cognitive status: Impaired; he started aricept , also notes hearing declined significantly in last year   SENSATION: L hand  COORDINATION: RAM mildly ataxic, symmetrical with FTN and opposition  MUSCLE LENGTH: Hamstrings: tightness noted bilaterally  POSTURE: No Significant postural limitations  LOWER EXTREMITY ROM:     WFLs  LOWER EXTREMITY MMT:   MMT Right Eval Left Eval  Hip flexion 5/5 5/5  Hip extension    Hip abduction    Hip adduction    Hip internal rotation    Hip external rotation    Knee flexion 5/5 5/5  Knee extension 5/5 5/5  Ankle dorsiflexion 4-/5 5/5  Ankle plantarflexion    Ankle  inversion    Ankle eversion    (Blank rows = not tested)  BED MOBILITY:  Findings: Sit to supine Complete Independence  TRANSFERS: Sit to stand: Complete Independence  Assistive device utilized: to rollator RW      STAIRS: TBA-- has 5 at home  GAIT: Findings: Gait Characteristics: decreased step length- Right, decreased step length- Left, decreased stride length, decreased ankle dorsiflexion- Right, decreased ankle dorsiflexion- Left, shuffling, poor foot clearance- Right, and poor foot clearance- Left, Distance walked: 100 ft, Assistive device utilized:Walker - 4 wheeled and None, Level of assistance: SBA, and Comments: Patient ambulates with frequent festinating patterns and reports at times freezing for 1 minute in the home Gait speed=1.28 ft/sec without device Gait speed=1.76 ft/sec with rollator  FUNCTIONAL TESTS:  5 times sit to stand: 22.92 Timed up and go (TUG): 25.22, TUG cognitive is 33.60 seconds with diminished accuracy in counting Berg Balance Scale:  Item Test date: 10/19/23 Test date:  Test date:   Sitting to standing 4. able to stand without using hands and stabilize independently Insert OPRCBERGREEVAL SmartPhrase at re-test date Insert OPRCBERGREEVAL SmartPhrase at re-test date  2. Standing unsupported 4. able to stand safely for 2 minutes    3. Sitting with back unsupported, feet supported 4. able to sit safely and securely for 2 minutes    4. Standing to sitting 4. sits safely with minimal use of hands    5. Pivot transfer  4. able to transfer safely with minor use of hands    6. Standing unsupported with eyes closed 4. able to stand 10 seconds safely    7. Standing unsupported  with feet together 3. able to place feet together independently and stand 1 minute with supervision    8. Reaching forward with outstretched arms while standing 2. can reach forward 5 cm (2 inches)    9. Pick up object from the floor from standing 3. able to pick up slipper but needs supervision    10. Turning to look behind over left and right shoulders while standing 2. turns sideways but only maintains balance    11. Turn 360 degrees 1. needs close supervision or verbal cuing    12. Place alternate foot on step or stool while standing unsupported 0. needs assistance to keep from falling/unable to try    13. Standing unsupported one foot in front 2. able to take small step independently and hold 30 seconds    14. Standing on one leg 1. tries to lift leg unable to hold 3 seconds but remains standing independently.      Total Score 38/56 Total Score x /56 Total Score x/56   Turning= 16 steps to the R and 22 steps to the L  PATIENT SURVEYS:  Freezing of gait questionnaire Not performed  [facility] Adult PT Treatment:                                                DATE: 10/19/23 Gait: Discussed tips to reduce freezing and practiced in session. Tips to reduce freezing episodes with standing or walking:  Stand tall with your feet wide, so that you can rock and weight shift through your hips. Don't try to fight the freeze: if you begin taking slower, faster, smaller steps, STOP, get your posture tall, and RESET your posture and balance.  Take a deep breath before taking the BIG step to start again. March in place, with high knee stepping, to get started walking again. Use auditory cues:  Count out loud, think of a familiar tune or song or cadence, use pocket metronome, to use rhythm to get started walking again. Use visual cues:  Use a line to step over, use laser pointer line to step  over, (using BIG steps) to start walking again. Use visual targets to keep your posture tall (look ahead and focus on an object or target at eye level). As you approach where your destination with walking, count your steps out loud and/or focus on your target with your eyes until you are fully there. Use appropriate assistive device, as advised by your physical therapist to assist with taking longer, consistent steps.   PATIENT EDUCATION: Education details: Nature of PT, goals for therapy, tips to reduce freezing. Person educated: Patient and Spouse Education method: Explanation, Demonstration, and Handouts Education comprehension: verbalized understanding and returned demonstration  HOME EXERCISE PROGRAM: None established-- provided tips for freezing  GOALS: Goals reviewed with patient? Yes  SHORT TERM GOALS: Target date: 11/18/23  The patient will perform HEP with wife's assist. Baseline: initiated at eval Goal status: IN PROGRESS  2.  The patient will improve gait speed to 2.5 ft/sec with rollater demonstrating dec'd festination. Baseline:  1.76 ft/sec Goal status: IN PROGRESS  3.   The patient will improve Berg to > or equal to 42/56.  Baseline:  38/56 Goal status: IN PROGRESS  4.  The patient will move floor<>stand with UE support due to h/o falls. Baseline:  PT to further assess Goal status: IN PROGRESS  LONG TERM GOALS: Target date: 12/19/23  The patient will have progression of HEP with wife's assist Baseline:  initiated at eval Goal status: IN PROGRESS  2.  The patient will improve freezing of gait by 5 points to demonstrate improved functional abilities. Baseline: 17/24 10/23/23 Goal status: IN PROGRESS  3.  The patient will improve Berg to > or equal to 46/56. Baseline:  38/56 Goal status: IN PROGRESS  4.  The patient will improve TUG score < or equal to 20 seconds. Baseline:  25.22 seconds Goal status: IN PROGRESS  5.  The patient will improve 5 time  sit<>stand to < 18 seconds. Baseline: 22.92 seconds Goal status: IN PROGRESS  6.  The patient will report reduction in falls to < or equal to 2x/month. Baseline:  varies-- has been as much as daily to 2x/month this year Goal status: IN PROGRESS  ASSESSMENT:  CLINICAL IMPRESSION: Neuro-reed to improve motor control and coordination for improved postural control during mobility. Training/strategies for improved step height and stride length to reduce shuffling/festinating with cues for reinforcing strategies for turning. Static balance to improve postural control for enhanced safety with  unsupported standing to improve safety with ADL and facilitate reactive balance strategies. Difficulty with multisensory conditions with tendecy for retro-LOB. Continued sesions to progress POC details to reduce risk for falls  OBJECTIVE IMPAIRMENTS: Abnormal gait, decreased balance, decreased coordination, decreased knowledge of use of DME, decreased mobility, difficulty walking, increased fascial restrictions, impaired flexibility, postural dysfunction, and pain.   ACTIVITY LIMITATIONS: carrying, lifting, bending, standing, transfers, bed mobility, and locomotion level  PARTICIPATION LIMITATIONS: cleaning, personal finances, driving, community activity, and occupation  PERSONAL FACTORS: 3+ comorbidities: see above are also affecting patient's functional outcome.   REHAB POTENTIAL: Good  CLINICAL DECISION MAKING: Unstable/unpredictable  EVALUATION COMPLEXITY: High  PLAN:  PT FREQUENCY: 2x/week  PT DURATION: 8 weeks  PLANNED INTERVENTIONS: 97164- PT Re-evaluation, 97750- Physical Performance Testing, 97110-Therapeutic exercises, 97530- Therapeutic activity, [id number]- Neuromuscular re-education, 97535- Self Care, [id number]- Manual therapy, [phone]- Gait training, Patient/Family education, Balance training, Stair training, and DME instructions  PLAN FOR NEXT SESSION: PWR! Exercises working initially on turns  and reducing freezing during gait.  *adjust height of walker   11:06 AM, 11/02/23 M. Kelly Rolene Andrades, PT, DPT Physical Therapist- [place] Office Number: [phone]

## 2023-11-07 ENCOUNTER — Ambulatory Visit

## 2023-11-07 DIAGNOSIS — R2689 Other abnormalities of gait and mobility: Secondary | ICD-10-CM | POA: Diagnosis not present

## 2023-11-07 DIAGNOSIS — R2681 Unsteadiness on feet: Secondary | ICD-10-CM

## 2023-11-07 DIAGNOSIS — R29818 Other symptoms and signs involving the nervous system: Secondary | ICD-10-CM

## 2023-11-07 DIAGNOSIS — R293 Abnormal posture: Secondary | ICD-10-CM

## 2023-11-07 NOTE — Therapy (Signed)
 OUTPATIENT PHYSICAL THERAPY NEURO TREATMENT   Patient Name: Melvin Roberts MRN: [id number] DOB:May 18, 1960, 63 y.o., male Today's Date: 11/07/2023   PCP: [name], MD REFERRING PROVIDER: [name], MD  END OF SESSION:  PT End of Session - 11/07/23 0936     Visit Number 5    Number of Visits 16    Date for PT Re-Evaluation 12/18/23    Authorization Type aetna; no VL    PT Start Time 0930    PT Stop Time 1015    PT Time Calculation (min) 45 min    Equipment Utilized During Treatment Gait belt    Activity Tolerance Patient tolerated treatment well    Behavior During Therapy WFL for tasks assessed/performed           Past Medical History:  Diagnosis Date   Allergy    Bipolar 1 disorder (HCC)    GERD (gastroesophageal reflux disease)    Hyperlipidemia    Hypertension    Past Surgical History:  Procedure Laterality Date   BACK SURGERY  11/17/1995   Dr. Duwayne   COLONOSCOPY     EYE SURGERY     HERNIA REPAIR  04/19/2007   umbilical   UPPER GASTROINTESTINAL ENDOSCOPY  x2   w/ esophageal dilation   Patient Active Problem List   Diagnosis Date Noted   Tremor of both hands 04/20/2023   History of bipolar disorder 04/20/2023   Short-term memory loss 04/20/2023   Cognitive attention deficit 04/20/2023   Ataxia involving legs 04/20/2023   Upper respiratory tract infection 08/17/2020   Vitamin D  deficiency 04/15/2019   BMI 32.0-32.9,adult 04/15/2019   Hypertension 09/16/2013   Bipolar disorder (HCC) 03/11/2013   Allergic rhinitis 03/11/2013   BPH (benign prostatic hyperplasia) 03/11/2013   Hyperlipidemia 05/29/2007   Depression, recurrent (HCC) 05/29/2007   Sinusitis, chronic 05/29/2007   ESOPHAGEAL STRICTURE 05/29/2007   GERD 05/29/2007   HIATAL HERNIA 05/29/2007   LOW BACK PAIN, CHRONIC 05/29/2007    ONSET DATE: 10/02/23  REFERRING DIAG:  R25.1 (ICD-10-CM) - Tremor of both hands  R41.3 (ICD-10-CM) - Short-term memory loss  Z86.59  (ICD-10-CM) - History of bipolar disorder  F33.9 (ICD-10-CM) - Depression, recurrent (HCC    THERAPY DIAG:  Other symptoms and signs involving the nervous system  Unsteadiness on feet  Other abnormalities of gait and mobility  Abnormal posture  Rationale for Evaluation and Treatment: Rehabilitation  SUBJECTIVE:  SUBJECTIVE STATEMENT: Doing pretty good[place]  Pt accompanied by: significant other (spouse: [name])  PERTINENT HISTORY: parkinsonism, bipolar disorder, HTN, hyperlipidemia, knee surgeries 08/2022  PAIN:  Are you having pain? Yes: NPRS scale: no Pain location: low back, knees Pain description: varies Aggravating factors: weather Relieving factors: varies  PRECAUTIONS: Fall  WEIGHT BEARING RESTRICTIONS: No  FALLS: Has patient fallen in last 6 months? Yes. Number of falls 2, in the last month. He was falling daily until getting the rollator RW in the past few months.  LIVING ENVIRONMENT: Lives with: lives with their spouse Lives in: House/apartment Stairs: Yes: External: 5 steps; can uses a rail-- he doesn't get his foot on the step well and going up he catches his foot Has following equipment at home: Walker - 4 wheeled  PLOF: Independent , declining in past year  PATIENT GOALS: walk more steady (without device would be nice)  OBJECTIVE:   TODAY'S TREATMENT: 11/07/23 Activity Comments  NU-step speed intervals x 8 min 2 min warm-up 30 sec speed; 30 sec recovery for rapid alternating movement  Gait training w/ trekking poles -stair ambulation to agility ladder w/ dots to coordinate step length/stride -clockface turns w/ visual reference -trials w/ step/stride length and reciprocal arm swing  Standing PWR moves w/ trekking poles 1x10   Education  regarding walking w/ beat to music            TODAY'S TREATMENT: 11/02/23 Activity Comments  NU-step speed intervals x 8 min 2 min warm-up 30 sec speed; 60 sec recovery for rapid alternating movement  Education on Pedaling for Parkinson's   4 square step 3x60 sec w/ canes then added 6 hurdles to lateral step--difficulty with foot clearance for backwards/hip extension--trials of focused practice for hip extension--minimal carryover to 4-square step  Gait training -trials of resisted walk (blue band)--seems to have increased shuffling/festinating as velocity was increased after resistance removed -high step march w/ and without visual target  Balance/postural control On firm/foam EO/EC. perturbations               HOME EXERCISE PROGRAM Last updated: 10/23/23 Sitting PWR moves 2x10 each/day  PATIENT EDUCATION: Education details: sitting PWR moves handout as well as Geneticist, molecular  Person educated: Patient and Spouse Education method: Explanation, Demonstration, Tactile cues, Verbal cues, and Handouts Education comprehension: verbalized understanding and returned demonstration    Note: Objective measures were completed at Evaluation unless otherwise noted. DIAGNOSTIC FINDINGS: DAT scan, NPH assessment  COGNITION: Overall cognitive status: Impaired; he started aricept , also notes hearing declined significantly in last year   SENSATION: L hand  COORDINATION: RAM mildly ataxic, symmetrical with FTN and opposition  MUSCLE LENGTH: Hamstrings: tightness noted bilaterally  POSTURE: No Significant postural limitations  LOWER EXTREMITY ROM:    WFLs  LOWER EXTREMITY MMT:   MMT Right Eval Left Eval  Hip flexion 5/5 5/5  Hip extension    Hip abduction    Hip adduction    Hip internal rotation    Hip external rotation    Knee flexion 5/5 5/5  Knee extension 5/5 5/5  Ankle dorsiflexion 4-/5 5/5  Ankle plantarflexion    Ankle inversion    Ankle eversion    (Blank  rows = not tested)  BED MOBILITY:  Findings: Sit to supine Complete Independence  TRANSFERS: Sit to stand: Complete Independence  Assistive device utilized: to rollator RW      STAIRS: TBA-- has 5 at home  GAIT: Findings: Gait Characteristics: decreased  step length- Right, decreased step length- Left, decreased stride length, decreased ankle dorsiflexion- Right, decreased ankle dorsiflexion- Left, shuffling, poor foot clearance- Right, and poor foot clearance- Left, Distance walked: 100 ft, Assistive device utilized:Walker - 4 wheeled and None, Level of assistance: SBA, and Comments: Patient ambulates with frequent festinating patterns and reports at times freezing for 1 minute in the home Gait speed=1.28 ft/sec without device Gait speed=1.76 ft/sec with rollator  FUNCTIONAL TESTS:  5 times sit to stand: 22.92 Timed up and go (TUG): 25.22, TUG cognitive is 33.60 seconds with diminished accuracy in counting Berg Balance Scale:  Item Test date: 10/19/23 Test date:  Test date:   Sitting to standing 4. able to stand without using hands and stabilize independently Insert OPRCBERGREEVAL SmartPhrase at re-test date Insert OPRCBERGREEVAL SmartPhrase at re-test date  2. Standing unsupported 4. able to stand safely for 2 minutes    3. Sitting with back unsupported, feet supported 4. able to sit safely and securely for 2 minutes    4. Standing to sitting 4. sits safely with minimal use of hands    5. Pivot transfer  4. able to transfer safely with minor use of hands    6. Standing unsupported with eyes closed 4. able to stand 10 seconds safely    7. Standing unsupported with feet together 3. able to place feet together independently and stand 1 minute with supervision    8. Reaching forward with outstretched arms while standing 2. can reach forward 5 cm (2 inches)    9. Pick up object from the floor from standing 3. able to pick up slipper but needs supervision    10. Turning to look behind over left  and right shoulders while standing 2. turns sideways but only maintains balance    11. Turn 360 degrees 1. needs close supervision or verbal cuing    12. Place alternate foot on step or stool while standing unsupported 0. needs assistance to keep from falling/unable to try    13. Standing unsupported one foot in front 2. able to take small step independently and hold 30 seconds    14. Standing on one leg 1. tries to lift leg unable to hold 3 seconds but remains standing independently.      Total Score 38/56 Total Score x /56 Total Score x/56   Turning= 16 steps to the R and 22 steps to the L  PATIENT SURVEYS:  Freezing of gait questionnaire Not performed                                                                                                                              [facility] Adult PT Treatment:                                                DATE:  10/19/23 Gait: Discussed tips to reduce freezing and practiced in session. Tips to reduce freezing episodes with standing or walking:  Stand tall with your feet wide, so that you can rock and weight shift through your hips. Don't try to fight the freeze: if you begin taking slower, faster, smaller steps, STOP, get your posture tall, and RESET your posture and balance.  Take a deep breath before taking the BIG step to start again. March in place, with high knee stepping, to get started walking again. Use auditory cues:  Count out loud, think of a familiar tune or song or cadence, use pocket metronome, to use rhythm to get started walking again. Use visual cues:  Use a line to step over, use laser pointer line to step over, (using BIG steps) to start walking again. Use visual targets to keep your posture tall (look ahead and focus on an object or target at eye level). As you approach where your destination with walking, count your steps out loud and/or focus on your target with your eyes until you are fully there. Use appropriate assistive device,  as advised by your physical therapist to assist with taking longer, consistent steps.   PATIENT EDUCATION: Education details: Nature of PT, goals for therapy, tips to reduce freezing. Person educated: Patient and Spouse Education method: Explanation, Demonstration, and Handouts Education comprehension: verbalized understanding and returned demonstration  HOME EXERCISE PROGRAM: None established-- provided tips for freezing  GOALS: Goals reviewed with patient? Yes  SHORT TERM GOALS: Target date: 11/18/23  The patient will perform HEP with wife's assist. Baseline: initiated at eval Goal status: IN PROGRESS  2.  The patient will improve gait speed to 2.5 ft/sec with rollater demonstrating dec'd festination. Baseline:  1.76 ft/sec Goal status: IN PROGRESS  3.   The patient will improve Berg to > or equal to 42/56.  Baseline:  38/56 Goal status: IN PROGRESS  4.  The patient will move floor<>stand with UE support due to h/o falls. Baseline:  PT to further assess Goal status: IN PROGRESS  LONG TERM GOALS: Target date: 12/19/23  The patient will have progression of HEP with wife's assist Baseline:  initiated at eval Goal status: IN PROGRESS  2.  The patient will improve freezing of gait by 5 points to demonstrate improved functional abilities. Baseline: 17/24 10/23/23 Goal status: IN PROGRESS  3.  The patient will improve Berg to > or equal to 46/56. Baseline:  38/56 Goal status: IN PROGRESS  4.  The patient will improve TUG score < or equal to 20 seconds. Baseline:  25.22 seconds Goal status: IN PROGRESS  5.  The patient will improve 5 time sit<>stand to < 18 seconds. Baseline: 22.92 seconds Goal status: IN PROGRESS  6.  The patient will report reduction in falls to < or equal to 2x/month. Baseline:  varies-- has been as much as daily to 2x/month this year Goal status: IN PROGRESS  ASSESSMENT:  CLINICAL IMPRESSION: Neuro-reed to improve motor control and coordination  for improved postural control during mobility. Training/strategies for improved step height and stride length to reduce shuffling/festinating with cues for reinforcing strategies for turning. Gait training with emphasis on larger amplitude movements and facilitation of reciprocal arm swing. Discussion of additional cues for gait sequencing min A for maintenance and facilitatio of turns progressed to SBA by end of sesssion. Continued POC to meet objectives  OBJECTIVE IMPAIRMENTS: Abnormal gait, decreased balance, decreased coordination, decreased knowledge of use of DME, decreased mobility, difficulty walking, increased fascial  restrictions, impaired flexibility, postural dysfunction, and pain.   ACTIVITY LIMITATIONS: carrying, lifting, bending, standing, transfers, bed mobility, and locomotion level  PARTICIPATION LIMITATIONS: cleaning, personal finances, driving, community activity, and occupation  PERSONAL FACTORS: 3+ comorbidities: see above are also affecting patient's functional outcome.   REHAB POTENTIAL: Good  CLINICAL DECISION MAKING: Unstable/unpredictable  EVALUATION COMPLEXITY: High  PLAN:  PT FREQUENCY: 2x/week  PT DURATION: 8 weeks  PLANNED INTERVENTIONS: 97164- PT Re-evaluation, 97750- Physical Performance Testing, 97110-Therapeutic exercises, 97530- Therapeutic activity, [id number]- Neuromuscular re-education, 97535- Self Care, [id number]- Manual therapy, [id number]- Gait training, Patient/Family education, Balance training, Stair training, and DME instructions  PLAN FOR NEXT SESSION: PWR! Exercises working initially on turns and reducing freezing during gait.  *adjust height of walker   9:36 AM, 11/07/23 M. Kelly Jhanvi Drakeford, PT, DPT Physical Therapist- [place] Office Number: [phone]

## 2023-11-09 ENCOUNTER — Ambulatory Visit

## 2023-11-09 DIAGNOSIS — R2689 Other abnormalities of gait and mobility: Secondary | ICD-10-CM | POA: Diagnosis not present

## 2023-11-09 DIAGNOSIS — R2681 Unsteadiness on feet: Secondary | ICD-10-CM

## 2023-11-09 DIAGNOSIS — R29818 Other symptoms and signs involving the nervous system: Secondary | ICD-10-CM

## 2023-11-09 DIAGNOSIS — R293 Abnormal posture: Secondary | ICD-10-CM

## 2023-11-09 NOTE — Therapy (Signed)
 OUTPATIENT PHYSICAL THERAPY NEURO TREATMENT   Patient Name: Melvin Roberts MRN: [id number] DOB:April 11, 1961, 63 y.o., male Today's Date: 11/09/2023   PCP: [name], MD REFERRING PROVIDER: [name], MD  END OF SESSION:  PT End of Session - 11/09/23 0923     Visit Number 6    Number of Visits 16    Date for PT Re-Evaluation 12/18/23    Authorization Type aetna; no VL    PT Start Time 0930    PT Stop Time 1015    PT Time Calculation (min) 45 min    Equipment Utilized During Treatment Gait belt    Activity Tolerance Patient tolerated treatment well    Behavior During Therapy WFL for tasks assessed/performed           Past Medical History:  Diagnosis Date   Allergy    Bipolar 1 disorder (HCC)    GERD (gastroesophageal reflux disease)    Hyperlipidemia    Hypertension    Past Surgical History:  Procedure Laterality Date   BACK SURGERY  11/17/1995   Dr. Duwayne   COLONOSCOPY     EYE SURGERY     HERNIA REPAIR  04/19/2007   umbilical   UPPER GASTROINTESTINAL ENDOSCOPY  x2   w/ esophageal dilation   Patient Active Problem List   Diagnosis Date Noted   Tremor of both hands 04/20/2023   History of bipolar disorder 04/20/2023   Short-term memory loss 04/20/2023   Cognitive attention deficit 04/20/2023   Ataxia involving legs 04/20/2023   Upper respiratory tract infection 08/17/2020   Vitamin D  deficiency 04/15/2019   BMI 32.0-32.9,adult 04/15/2019   Hypertension 09/16/2013   Bipolar disorder (HCC) 03/11/2013   Allergic rhinitis 03/11/2013   BPH (benign prostatic hyperplasia) 03/11/2013   Hyperlipidemia 05/29/2007   Depression, recurrent (HCC) 05/29/2007   Sinusitis, chronic 05/29/2007   ESOPHAGEAL STRICTURE 05/29/2007   GERD 05/29/2007   HIATAL HERNIA 05/29/2007   LOW BACK PAIN, CHRONIC 05/29/2007    ONSET DATE: 10/02/23  REFERRING DIAG:  R25.1 (ICD-10-CM) - Tremor of both hands  R41.3 (ICD-10-CM) - Short-term memory loss  Z86.59  (ICD-10-CM) - History of bipolar disorder  F33.9 (ICD-10-CM) - Depression, recurrent (HCC    THERAPY DIAG:  Other symptoms and signs involving the nervous system  Unsteadiness on feet  Other abnormalities of gait and mobility  Abnormal posture  Rationale for Evaluation and Treatment: Rehabilitation  SUBJECTIVE:  SUBJECTIVE STATEMENT: Was a little off yesterday so took it easy  Pt accompanied by: significant other (spouse: [name])  PERTINENT HISTORY: parkinsonism, bipolar disorder, HTN, hyperlipidemia, knee surgeries 08/2022  PAIN:  Are you having pain? Yes: NPRS scale: no Pain location: low back, knees Pain description: varies Aggravating factors: weather Relieving factors: varies  PRECAUTIONS: Fall  WEIGHT BEARING RESTRICTIONS: No  FALLS: Has patient fallen in last 6 months? Yes. Number of falls 2, in the last month. He was falling daily until getting the rollator RW in the past few months.  LIVING ENVIRONMENT: Lives with: lives with their spouse Lives in: House/apartment Stairs: Yes: External: 5 steps; can uses a rail-- he doesn't get his foot on the step well and going up he catches his foot Has following equipment at home: Walker - 4 wheeled  PLOF: Independent , declining in past year  PATIENT GOALS: walk more steady (without device would be nice)  OBJECTIVE:   TODAY'S TREATMENT: 11/09/23 Activity Comments  NU-step speed intervals x 8 min 2 min warm-up 30 sec speed; 30 sec recovery for rapid alternating movement. Cues for larger amplitude  Gait training -techniques to facilitate step length visual/auditory cues -use of trekking poles for auditory cues for initiating and facilitating turns -ambulation on grass/hill trekking poles CGA w/ dual-tasking  (conversation -strategies for turning: auditory, visual, counted steps -forwards/backwards x 2 min in // bars  balance -sidestepping x 2 min BUE support              TODAY'S TREATMENT: 11/07/23 Activity Comments  NU-step speed intervals x 8 min 2 min warm-up 30 sec speed; 30 sec recovery for rapid alternating movement  Gait training w/ trekking poles -stair ambulation to agility ladder w/ dots to coordinate step length/stride -clockface turns w/ visual reference -trials w/ step/stride length and reciprocal arm swing  Standing PWR moves w/ trekking poles 1x10   Education regarding walking w/ beat to music                     HOME EXERCISE PROGRAM Last updated: 10/23/23 Sitting PWR moves 2x10 each/day  PATIENT EDUCATION: Education details: sitting PWR moves handout as well as Geneticist, molecular  Person educated: Patient and Spouse Education method: Explanation, Demonstration, Tactile cues, Verbal cues, and Handouts Education comprehension: verbalized understanding and returned demonstration    Note: Objective measures were completed at Evaluation unless otherwise noted. DIAGNOSTIC FINDINGS: DAT scan, NPH assessment  COGNITION: Overall cognitive status: Impaired; he started aricept , also notes hearing declined significantly in last year   SENSATION: L hand  COORDINATION: RAM mildly ataxic, symmetrical with FTN and opposition  MUSCLE LENGTH: Hamstrings: tightness noted bilaterally  POSTURE: No Significant postural limitations  LOWER EXTREMITY ROM:    WFLs  LOWER EXTREMITY MMT:   MMT Right Eval Left Eval  Hip flexion 5/5 5/5  Hip extension    Hip abduction    Hip adduction    Hip internal rotation    Hip external rotation    Knee flexion 5/5 5/5  Knee extension 5/5 5/5  Ankle dorsiflexion 4-/5 5/5  Ankle plantarflexion    Ankle inversion    Ankle eversion    (Blank rows = not tested)  BED MOBILITY:  Findings: Sit to supine Complete  Independence  TRANSFERS: Sit to stand: Complete Independence  Assistive device utilized: to rollator RW      STAIRS: TBA-- has 5 at home  GAIT: Findings: Gait Characteristics: decreased step length- Right, decreased step length- Left,  decreased stride length, decreased ankle dorsiflexion- Right, decreased ankle dorsiflexion- Left, shuffling, poor foot clearance- Right, and poor foot clearance- Left, Distance walked: 100 ft, Assistive device utilized:Walker - 4 wheeled and None, Level of assistance: SBA, and Comments: Patient ambulates with frequent festinating patterns and reports at times freezing for 1 minute in the home Gait speed=1.28 ft/sec without device Gait speed=1.76 ft/sec with rollator  FUNCTIONAL TESTS:  5 times sit to stand: 22.92 Timed up and go (TUG): 25.22, TUG cognitive is 33.60 seconds with diminished accuracy in counting Berg Balance Scale:  Item Test date: 10/19/23 Test date:  Test date:   Sitting to standing 4. able to stand without using hands and stabilize independently Insert OPRCBERGREEVAL SmartPhrase at re-test date Insert OPRCBERGREEVAL SmartPhrase at re-test date  2. Standing unsupported 4. able to stand safely for 2 minutes    3. Sitting with back unsupported, feet supported 4. able to sit safely and securely for 2 minutes    4. Standing to sitting 4. sits safely with minimal use of hands    5. Pivot transfer  4. able to transfer safely with minor use of hands    6. Standing unsupported with eyes closed 4. able to stand 10 seconds safely    7. Standing unsupported with feet together 3. able to place feet together independently and stand 1 minute with supervision    8. Reaching forward with outstretched arms while standing 2. can reach forward 5 cm (2 inches)    9. Pick up object from the floor from standing 3. able to pick up slipper but needs supervision    10. Turning to look behind over left and right shoulders while standing 2. turns sideways but only  maintains balance    11. Turn 360 degrees 1. needs close supervision or verbal cuing    12. Place alternate foot on step or stool while standing unsupported 0. needs assistance to keep from falling/unable to try    13. Standing unsupported one foot in front 2. able to take small step independently and hold 30 seconds    14. Standing on one leg 1. tries to lift leg unable to hold 3 seconds but remains standing independently.      Total Score 38/56 Total Score x /56 Total Score x/56   Turning= 16 steps to the R and 22 steps to the L  PATIENT SURVEYS:  Freezing of gait questionnaire Not performed                                                                                                                              [facility] Adult PT Treatment:                                                DATE: 10/19/23 Gait: Discussed tips to reduce freezing  and practiced in session. Tips to reduce freezing episodes with standing or walking:  Stand tall with your feet wide, so that you can rock and weight shift through your hips. Don't try to fight the freeze: if you begin taking slower, faster, smaller steps, STOP, get your posture tall, and RESET your posture and balance.  Take a deep breath before taking the BIG step to start again. March in place, with high knee stepping, to get started walking again. Use auditory cues:  Count out loud, think of a familiar tune or song or cadence, use pocket metronome, to use rhythm to get started walking again. Use visual cues:  Use a line to step over, use laser pointer line to step over, (using BIG steps) to start walking again. Use visual targets to keep your posture tall (look ahead and focus on an object or target at eye level). As you approach where your destination with walking, count your steps out loud and/or focus on your target with your eyes until you are fully there. Use appropriate assistive device, as advised by your physical therapist to assist with taking  longer, consistent steps.   PATIENT EDUCATION: Education details: Nature of PT, goals for therapy, tips to reduce freezing. Person educated: Patient and Spouse Education method: Explanation, Demonstration, and Handouts Education comprehension: verbalized understanding and returned demonstration  HOME EXERCISE PROGRAM: None established-- provided tips for freezing  GOALS: Goals reviewed with patient? Yes  SHORT TERM GOALS: Target date: 11/18/23  The patient will perform HEP with wife's assist. Baseline: initiated at eval Goal status: IN PROGRESS  2.  The patient will improve gait speed to 2.5 ft/sec with rollater demonstrating dec'd festination. Baseline:  1.76 ft/sec Goal status: IN PROGRESS  3.   The patient will improve Berg to > or equal to 42/56.  Baseline:  38/56 Goal status: IN PROGRESS  4.  The patient will move floor<>stand with UE support due to h/o falls. Baseline:  PT to further assess Goal status: IN PROGRESS  LONG TERM GOALS: Target date: 12/19/23  The patient will have progression of HEP with wife's assist Baseline:  initiated at eval Goal status: IN PROGRESS  2.  The patient will improve freezing of gait by 5 points to demonstrate improved functional abilities. Baseline: 17/24 10/23/23 Goal status: IN PROGRESS  3.  The patient will improve Berg to > or equal to 46/56. Baseline:  38/56 Goal status: IN PROGRESS  4.  The patient will improve TUG score < or equal to 20 seconds. Baseline:  25.22 seconds Goal status: IN PROGRESS  5.  The patient will improve 5 time sit<>stand to < 18 seconds. Baseline: 22.92 seconds Goal status: IN PROGRESS  6.  The patient will report reduction in falls to < or equal to 2x/month. Baseline:  varies-- has been as much as daily to 2x/month this year Goal status: IN PROGRESS  ASSESSMENT:  CLINICAL IMPRESSION: NU-step for rapid alternating movement. Gait training to improve step length, height, and strategies to facilitate  turning and step initiation w/ multi-modal cues with good recall and implementation w/ verbal cues for strategies (supervision). CGA for uneven ground and cues for AD advancement to slow pace and maintain postural control. Dynamic and static balance to improve postural control with good tolerance and mild-moderat sway to eyes closed compliant surfaces. Continued sessions to progress POC details to improve safety with mobility and reduce risk for falls  OBJECTIVE IMPAIRMENTS: Abnormal gait, decreased balance, decreased coordination, decreased knowledge of use of DME,  decreased mobility, difficulty walking, increased fascial restrictions, impaired flexibility, postural dysfunction, and pain.   ACTIVITY LIMITATIONS: carrying, lifting, bending, standing, transfers, bed mobility, and locomotion level  PARTICIPATION LIMITATIONS: cleaning, personal finances, driving, community activity, and occupation  PERSONAL FACTORS: 3+ comorbidities: see above are also affecting patient's functional outcome.   REHAB POTENTIAL: Good  CLINICAL DECISION MAKING: Unstable/unpredictable  EVALUATION COMPLEXITY: High  PLAN:  PT FREQUENCY: 2x/week  PT DURATION: 8 weeks  PLANNED INTERVENTIONS: [id number]- PT Re-evaluation, 97750- Physical Performance Testing, 97110-Therapeutic exercises, 97530- Therapeutic activity, [id number]- Neuromuscular re-education, 97535- Self Care, [id number]- Manual therapy, [phone]- Gait training, Patient/Family education, Balance training, Stair training, and DME instructions  PLAN FOR NEXT SESSION: PWR! Exercises working initially on turns and reducing freezing during gait.  *adjust height of walker   9:24 AM, 11/09/23 M. Kelly Pinkney Venard, PT, DPT Physical Therapist- [place] Office Number: [phone]

## 2023-11-10 ENCOUNTER — Telehealth: Payer: Self-pay | Admitting: Family Medicine

## 2023-11-10 NOTE — Telephone Encounter (Signed)
Defer to PCP return °

## 2023-11-10 NOTE — Telephone Encounter (Signed)
 Copied from CRM [id number]. Topic: Clinical - Prescription Issue >> Nov 10, 2023  9:55 AM Vena H wrote: Reason for CRM: Pt's wife is wanting to know why the allopurinol  (ZYLOPRIM )  was increased from 100 to 300mg , she does not want to increased and would like a nurse to call her.

## 2023-11-13 ENCOUNTER — Encounter: Payer: Self-pay | Admitting: Adult Health

## 2023-11-13 ENCOUNTER — Telehealth: Admitting: Adult Health

## 2023-11-13 DIAGNOSIS — G4700 Insomnia, unspecified: Secondary | ICD-10-CM

## 2023-11-13 DIAGNOSIS — F319 Bipolar disorder, unspecified: Secondary | ICD-10-CM | POA: Diagnosis not present

## 2023-11-13 DIAGNOSIS — F411 Generalized anxiety disorder: Secondary | ICD-10-CM

## 2023-11-13 NOTE — Therapy (Signed)
 OUTPATIENT PHYSICAL THERAPY NEURO TREATMENT   Patient Name: Melvin Roberts MRN: [id number] DOB:1960/09/06, 62 y.o., male Today's Date: 11/14/2023   PCP: [name], MD REFERRING PROVIDER: [name], MD  END OF SESSION:  PT End of Session - 11/14/23 0927     Visit Number 7    Number of Visits 16    Date for PT Re-Evaluation 12/18/23    Authorization Type aetna; no VL    PT Start Time 0844    PT Stop Time 0926    PT Time Calculation (min) 42 min    Equipment Utilized During Treatment Gait belt    Activity Tolerance Patient tolerated treatment well    Behavior During Therapy WFL for tasks assessed/performed            Past Medical History:  Diagnosis Date   Allergy    Bipolar 1 disorder (HCC)    GERD (gastroesophageal reflux disease)    Hyperlipidemia    Hypertension    Past Surgical History:  Procedure Laterality Date   BACK SURGERY  11/17/1995   Dr. Duwayne   COLONOSCOPY     EYE SURGERY     HERNIA REPAIR  04/19/2007   umbilical   UPPER GASTROINTESTINAL ENDOSCOPY  x2   w/ esophageal dilation   Patient Active Problem List   Diagnosis Date Noted   Tremor of both hands 04/20/2023   History of bipolar disorder 04/20/2023   Short-term memory loss 04/20/2023   Cognitive attention deficit 04/20/2023   Ataxia involving legs 04/20/2023   Upper respiratory tract infection 08/17/2020   Vitamin D  deficiency 04/15/2019   BMI 32.0-32.9,adult 04/15/2019   Hypertension 09/16/2013   Bipolar disorder (HCC) 03/11/2013   Allergic rhinitis 03/11/2013   BPH (benign prostatic hyperplasia) 03/11/2013   Hyperlipidemia 05/29/2007   Depression, recurrent (HCC) 05/29/2007   Sinusitis, chronic 05/29/2007   ESOPHAGEAL STRICTURE 05/29/2007   GERD 05/29/2007   HIATAL HERNIA 05/29/2007   LOW BACK PAIN, CHRONIC 05/29/2007    ONSET DATE: 10/02/23  REFERRING DIAG:  R25.1 (ICD-10-CM) - Tremor of both hands  R41.3 (ICD-10-CM) - Short-term memory loss  Z86.59  (ICD-10-CM) - History of bipolar disorder  F33.9 (ICD-10-CM) - Depression, recurrent (HCC    THERAPY DIAG:  Other symptoms and signs involving the nervous system  Unsteadiness on feet  Other abnormalities of gait and mobility  Abnormal posture  Rationale for Evaluation and Treatment: Rehabilitation  SUBJECTIVE:  SUBJECTIVE STATEMENT: Feel like it's getting a little better each time. Denies recent falls.   Pt accompanied by: significant other (spouse: [name])  PERTINENT HISTORY: parkinsonism, bipolar disorder, HTN, hyperlipidemia, knee surgeries 08/2022  PAIN:  Are you having pain? Yes: NPRS scale: no Pain location: low back, knees Pain description: varies Aggravating factors: weather Relieving factors: varies  PRECAUTIONS: Fall  WEIGHT BEARING RESTRICTIONS: No  FALLS: Has patient fallen in last 6 months? Yes. Number of falls 2, in the last month. He was falling daily until getting the rollator RW in the past few months.  LIVING ENVIRONMENT: Lives with: lives with their spouse Lives in: House/apartment Stairs: Yes: External: 5 steps; can uses a rail-- he doesn't get his foot on the step well and going up he catches his foot Has following equipment at home: Walker - 4 wheeled  PLOF: Independent , declining in past year  PATIENT GOALS: walk more steady (without device would be nice)  OBJECTIVE:     TODAY'S TREATMENT: 11/14/23 Activity Comments  Gait training for safe foot placement within walker wheels, walker close to BOS while walking into clinic, safety with 4WW with transfers   nustep HIIT for neural priming 2 min warm up L1 30 sec L6 fast 30 sec L5 fast 1 min L2 1 min L6 30 sec L4 1 min L2 30 sec fast L6 30 sec L4 1 min cooldown L1 Cueing for larger  strides/amplitude, maintain exertion during higher levels of resistance   staggered ant/pos wt shift + alt UE swing while maintaining at least 1 UE support on stair rail Demo and verbal/manual cueing to encourage continue wt shift even when adding UE swing   wide BOS lateral wt shift + UE reach  Good ability to coordinating UE/LE movement, more trouble and cueing required to maintain large amplitude movements   standing PWR moves in II bars: up rock twist step Demo and verbal cueing for hand/eye boost. Practiced pivot on toes in isolation. Practice rock to therapy poles as targets to encourage wt shift           PATIENT EDUCATION: Education details: edu on use of mirror and music to maintain consistent amplitude with exercise Person educated: Patient and Spouse Education method: Explanation, Demonstration, Tactile cues, and Verbal cues Education comprehension: verbalized understanding      HOME EXERCISE PROGRAM Last updated: 10/23/23 Sitting PWR moves 2x10 each/day     Note: Objective measures were completed at Evaluation unless otherwise noted. DIAGNOSTIC FINDINGS: DAT scan, NPH assessment  COGNITION: Overall cognitive status: Impaired; he started aricept , also notes hearing declined significantly in last year   SENSATION: L hand  COORDINATION: RAM mildly ataxic, symmetrical with FTN and opposition  MUSCLE LENGTH: Hamstrings: tightness noted bilaterally  POSTURE: No Significant postural limitations  LOWER EXTREMITY ROM:    WFLs  LOWER EXTREMITY MMT:   MMT Right Eval Left Eval  Hip flexion 5/5 5/5  Hip extension    Hip abduction    Hip adduction    Hip internal rotation    Hip external rotation    Knee flexion 5/5 5/5  Knee extension 5/5 5/5  Ankle dorsiflexion 4-/5 5/5  Ankle plantarflexion    Ankle inversion    Ankle eversion    (Blank rows = not tested)  BED MOBILITY:  Findings: Sit to supine Complete Independence  TRANSFERS: Sit to stand:  Complete Independence  Assistive device utilized: to rollator RW      STAIRS: TBA-- has 5 at home  GAIT: Findings: Gait Characteristics: decreased step length- Right, decreased step length- Left, decreased stride length, decreased ankle dorsiflexion- Right, decreased ankle dorsiflexion- Left, shuffling, poor foot clearance- Right, and poor foot clearance- Left, Distance walked: 100 ft, Assistive device utilized:Walker - 4 wheeled and None, Level of assistance: SBA, and Comments: Patient ambulates with frequent festinating patterns and reports at times freezing for 1 minute in the home Gait speed=1.28 ft/sec without device Gait speed=1.76 ft/sec with rollator  FUNCTIONAL TESTS:  5 times sit to stand: 22.92 Timed up and go (TUG): 25.22, TUG cognitive is 33.60 seconds with diminished accuracy in counting Berg Balance Scale:  Item Test date: 10/19/23 Test date:  Test date:   Sitting to standing 4. able to stand without using hands and stabilize independently Insert OPRCBERGREEVAL SmartPhrase at re-test date Insert OPRCBERGREEVAL SmartPhrase at re-test date  2. Standing unsupported 4. able to stand safely for 2 minutes    3. Sitting with back unsupported, feet supported 4. able to sit safely and securely for 2 minutes    4. Standing to sitting 4. sits safely with minimal use of hands    5. Pivot transfer  4. able to transfer safely with minor use of hands    6. Standing unsupported with eyes closed 4. able to stand 10 seconds safely    7. Standing unsupported with feet together 3. able to place feet together independently and stand 1 minute with supervision    8. Reaching forward with outstretched arms while standing 2. can reach forward 5 cm (2 inches)    9. Pick up object from the floor from standing 3. able to pick up slipper but needs supervision    10. Turning to look behind over left and right shoulders while standing 2. turns sideways but only maintains balance    11. Turn 360 degrees 1.  needs close supervision or verbal cuing    12. Place alternate foot on step or stool while standing unsupported 0. needs assistance to keep from falling/unable to try    13. Standing unsupported one foot in front 2. able to take small step independently and hold 30 seconds    14. Standing on one leg 1. tries to lift leg unable to hold 3 seconds but remains standing independently.      Total Score 38/56 Total Score x /56 Total Score x/56   Turning= 16 steps to the R and 22 steps to the L  PATIENT SURVEYS:  Freezing of gait questionnaire Not performed                                                                                                                              [facility] Adult PT Treatment:  DATE: 10/19/23 Gait: Discussed tips to reduce freezing and practiced in session. Tips to reduce freezing episodes with standing or walking:  Stand tall with your feet wide, so that you can rock and weight shift through your hips. Don't try to fight the freeze: if you begin taking slower, faster, smaller steps, STOP, get your posture tall, and RESET your posture and balance.  Take a deep breath before taking the BIG step to start again. March in place, with high knee stepping, to get started walking again. Use auditory cues:  Count out loud, think of a familiar tune or song or cadence, use pocket metronome, to use rhythm to get started walking again. Use visual cues:  Use a line to step over, use laser pointer line to step over, (using BIG steps) to start walking again. Use visual targets to keep your posture tall (look ahead and focus on an object or target at eye level). As you approach where your destination with walking, count your steps out loud and/or focus on your target with your eyes until you are fully there. Use appropriate assistive device, as advised by your physical therapist to assist with taking longer, consistent steps.   PATIENT  EDUCATION: Education details: Nature of PT, goals for therapy, tips to reduce freezing. Person educated: Patient and Spouse Education method: Explanation, Demonstration, and Handouts Education comprehension: verbalized understanding and returned demonstration  HOME EXERCISE PROGRAM: None established-- provided tips for freezing  GOALS: Goals reviewed with patient? Yes  SHORT TERM GOALS: Target date: 11/18/23  The patient will perform HEP with wife's assist. Baseline: initiated at eval Goal status: IN PROGRESS  2.  The patient will improve gait speed to 2.5 ft/sec with rollater demonstrating dec'd festination. Baseline:  1.76 ft/sec Goal status: IN PROGRESS  3.   The patient will improve Berg to > or equal to 42/56.  Baseline:  38/56 Goal status: IN PROGRESS  4.  The patient will move floor<>stand with UE support due to h/o falls. Baseline:  PT to further assess Goal status: IN PROGRESS  LONG TERM GOALS: Target date: 12/19/23  The patient will have progression of HEP with wife's assist Baseline:  initiated at eval Goal status: IN PROGRESS  2.  The patient will improve freezing of gait by 5 points to demonstrate improved functional abilities. Baseline: 17/24 10/23/23 Goal status: IN PROGRESS  3.  The patient will improve Berg to > or equal to 46/56. Baseline:  38/56 Goal status: IN PROGRESS  4.  The patient will improve TUG score < or equal to 20 seconds. Baseline:  25.22 seconds Goal status: IN PROGRESS  5.  The patient will improve 5 time sit<>stand to < 18 seconds. Baseline: 22.92 seconds Goal status: IN PROGRESS  6.  The patient will report reduction in falls to < or equal to 2x/month. Baseline:  varies-- has been as much as daily to 2x/month this year Goal status: IN PROGRESS  ASSESSMENT:  CLINICAL IMPRESSION: Patient arrived to session without complaints. Session focused on weight shifting and maintaining larger amplitude movements throughout activities.  Patient does quite well coordinating alternating reciprocal movements, but requires consistent cueing on maintaining amplitude. Provided edu on tips to improve on this at home. No complaints at end of session.   OBJECTIVE IMPAIRMENTS: Abnormal gait, decreased balance, decreased coordination, decreased knowledge of use of DME, decreased mobility, difficulty walking, increased fascial restrictions, impaired flexibility, postural dysfunction, and pain.   ACTIVITY LIMITATIONS: carrying, lifting, bending, standing, transfers, bed mobility, and locomotion level  PARTICIPATION LIMITATIONS: cleaning, personal finances, driving, community activity, and occupation  PERSONAL FACTORS: 3+ comorbidities: see above are also affecting patient's functional outcome.   REHAB POTENTIAL: Good  CLINICAL DECISION MAKING: Unstable/unpredictable  EVALUATION COMPLEXITY: High  PLAN:  PT FREQUENCY: 2x/week  PT DURATION: 8 weeks  PLANNED INTERVENTIONS: 97164- PT Re-evaluation, 97750- Physical Performance Testing, 97110-Therapeutic exercises, 97530- Therapeutic activity, [id number]- Neuromuscular re-education, 97535- Self Care, [id number]- Manual therapy, [phone]- Gait training, Patient/Family education, Balance training, Stair training, and DME instructions  PLAN FOR NEXT SESSION: PWR! Exercises working initially on turns and reducing freezing during gait.  *adjust height of walker   Louana Terrilyn Christians, PT, DPT 11/14/23 9:30 AM  [facility] at [facility] [address] [place], [address] [place], [place] [address] Phone # [phone] Fax # [phone]

## 2023-11-13 NOTE — Progress Notes (Signed)
 Melvin Roberts [id number] 10-Oct-1960 63 y.o.  Virtual Visit via Video Note  I connected with pt @ on 11/13/23 at 10:30 AM EDT by a video enabled telemedicine application and verified that I am speaking with the correct person using two identifiers.   I discussed the limitations of evaluation and management by telemedicine and the availability of in person appointments. The patient expressed understanding and agreed to proceed.  I discussed the assessment and treatment plan with the patient. The patient was provided an opportunity to ask questions and all were answered. The patient agreed with the plan and demonstrated an understanding of the instructions.   The patient was advised to call back or seek an in-person evaluation if the symptoms worsen or if the condition fails to improve as anticipated.  I provided 25 minutes of non-face-to-face time during this encounter.  The patient was located at home.  The provider was located at [facility].   Angeline LOISE Sayers, NP   Subjective:   Patient ID:  Melvin Roberts is a 63 y.o. (DOB 1960/06/24) male.  Chief Complaint: No chief complaint on file.   HPI Leeum D Bauserman presents for follow-up of Bipolar Disorder, GAD and insomnia.  Accompanied by wife. Trouble with directions - processing.  Describes mood today as ok. Mood symptoms - reports depression - a little bit. Reports varying interest and motivation. Stating I was hoping I would be doing better by now. Reports anxiety about the future. Denies irritability. Denies panic attacks. Denies over thinking. Reports some worry and rumination. Reports mood is stable. Stating I feel like I'm managing - kinda blah - so-so. Wife feels like he's been kind of withdrawn. Reports upcoming vacation. Taking medications as prescribed. Energy levels lower. Active, does not have a regular exercise routine - walking less. Has started with P/T. Enjoys some usual interests  and activities. Married. Lives with wife. Has 5 children and 11 grandchildren. Spending time with family. Appetite adequate. Weight loss - 230 from 242 pounds. Reports sleep consistent. Averages 10 to 12 hours off and on. Denies recent daytime napping. Reports focus and concentration difficulties - it's still up and down. Managing minimal aspects of household. Disabled. Denies SI or HI.  Denies AH or VH. Denies self harm. Denies substance use. Reports hearing loss.  Previous medication trials: Lithium , Seroquel   Review of Systems:  Review of Systems  Musculoskeletal:  Negative for gait problem.  Neurological:  Negative for tremors.  Psychiatric/Behavioral:         Please refer to HPI    Medications: I have reviewed the patient's current medications.  Current Outpatient Medications  Medication Sig Dispense Refill   allopurinol  (ZYLOPRIM ) 300 MG tablet Take 1 tablet (300 mg total) by mouth daily. 90 tablet 1   carbidopa -levodopa  (SINEMET  IR) 25-100 MG tablet Take 1 tablet by mouth 3 (three) times daily. Take med 30 minutes before a meal. 270 tablet 3   cholecalciferol (VITAMIN D ) 1000 UNITS tablet Take 1,000 Units by mouth daily.     colchicine  0.6 MG tablet Take twice daily for gout attack. (may take every two hours up to 6 doses at acute onset) 60 tablet 2   divalproex  (DEPAKOTE ) 500 MG DR tablet Take two tablets in the morning and take two tablets at bedtime. 360 tablet 1   donepezil  (ARICEPT ) 5 MG tablet Take 1 tablet (5 mg total) by mouth at bedtime. 90 tablet 0   famotidine  (PEPCID ) 20 MG tablet Take 1 tablet (20 mg total)  by mouth 2 (two) times daily. 180 tablet 3   methocarbamol  (ROBAXIN ) 500 MG tablet TAKE 1 TABLET BY MOUTH 4 TIMES DAILY. 120 tablet 0   pantoprazole  (PROTONIX ) 40 MG tablet Take 1 tablet (40 mg total) by mouth daily. 90 tablet 3   tamsulosin  (FLOMAX ) 0.4 MG CAPS capsule Take 2 capsules (0.8 mg total) by mouth at bedtime. For urine flow and prostate 180  capsule 3   No current facility-administered medications for this visit.    Medication Side Effects: None  Allergies:  Allergies  Allergen Reactions   Lipitor [Atorvastatin] Other (See Comments)   Niacin Other (See Comments)    Hot flash    Past Medical History:  Diagnosis Date   Allergy    Bipolar 1 disorder (HCC)    GERD (gastroesophageal reflux disease)    Hyperlipidemia    Hypertension     Family History  Problem Relation Age of Onset   Cancer Mother        breast   Cancer Father        lug   Esophageal cancer Maternal Grandfather    Cancer Maternal Grandfather    Cancer Paternal Grandfather    Colon cancer Neg Hx    Colon polyps Neg Hx    Rectal cancer Neg Hx    Stomach cancer Neg Hx     Social History   Socioeconomic History   Marital status: Married    Spouse name: Not on file   Number of children: Not on file   Years of education: Not on file   Highest education level: Associate degree: occupational, Scientist, product/process development, or vocational program  Occupational History   Not on file  Tobacco Use   Smoking status: Never   Smokeless tobacco: Never  Vaping Use   Vaping status: Never Used  Substance and Sexual Activity   Alcohol use: Not Currently   Drug use: No   Sexual activity: Not on file  Other Topics Concern   Not on file  Social History Narrative   Not on file   Social Drivers of Health   Financial Resource Strain: Low Risk  (06/07/2023)   Overall Financial Resource Strain (CARDIA)    Difficulty of Paying Living Expenses: Not hard at all  Food Insecurity: No Food Insecurity (06/07/2023)   Hunger Vital Sign    Worried About Running Out of Food in the Last Year: Never true    Ran Out of Food in the Last Year: Never true  Transportation Needs: No Transportation Needs (06/07/2023)   PRAPARE - Administrator, Civil Service (Medical): No    Lack of Transportation (Non-Medical): No  Physical Activity: Sufficiently Active (06/07/2023)   Exercise  Vital Sign    Days of Exercise per Week: 3 days    Minutes of Exercise per Session: 50 min  Stress: No Stress Concern Present (06/07/2023)   [employer] of Occupational Health - Occupational Stress Questionnaire    Feeling of Stress : Only a little  Social Connections: Socially Integrated (06/07/2023)   Social Connection and Isolation Panel    Frequency of Communication with Friends and Family: Twice a week    Frequency of Social Gatherings with Friends and Family: Once a week    Attends Religious Services: 1 to 4 times per year    Active Member of [employer] or Organizations: Yes    Attends Banker Meetings: 1 to 4 times per year    Marital Status: Married  Catering manager Violence:  Not on file    Past Medical History, Surgical history, Social history, and Family history were reviewed and updated as appropriate.   Please see review of systems for further details on the patient's review from today.   Objective:   Physical Exam:  There were no vitals taken for this visit.  Physical Exam Constitutional:      General: He is not in acute distress. Musculoskeletal:        General: No deformity.  Neurological:     Mental Status: He is alert and oriented to person, place, and time.     Coordination: Coordination normal.  Psychiatric:        Attention and Perception: Attention and perception normal. He does not perceive auditory or visual hallucinations.        Mood and Affect: Mood normal. Mood is not anxious or depressed. Affect is not labile, blunt, angry or inappropriate.        Speech: Speech normal.        Behavior: Behavior normal.        Thought Content: Thought content normal. Thought content is not paranoid or delusional. Thought content does not include homicidal or suicidal ideation. Thought content does not include homicidal or suicidal plan.        Cognition and Memory: Cognition and memory normal.        Judgment: Judgment normal.     Comments: Insight  intact     Lab Review:     Component Value Date/Time   NA 137 09/10/2023 2039   NA 143 09/05/2023 1629   K 4.4 09/10/2023 2039   CL 101 09/10/2023 2039   CO2 25 09/10/2023 2039   GLUCOSE 97 09/10/2023 2039   BUN 18 09/10/2023 2039   BUN 10 09/05/2023 1629   CREATININE 1.55 (H) 09/10/2023 2039   CALCIUM  9.7 09/10/2023 2039   PROT 6.8 09/10/2023 2039   PROT 6.2 09/05/2023 1629   ALBUMIN 3.6 09/10/2023 2039   ALBUMIN 3.5 (L) 09/05/2023 1629   AST 19 09/10/2023 2039   ALT <5 09/10/2023 2039   ALKPHOS 58 09/10/2023 2039   BILITOT 0.2 09/10/2023 2039   BILITOT 0.3 09/05/2023 1629   GFRNONAA 50 (L) 09/10/2023 2039   GFRAA 61 05/29/2020 0854       Component Value Date/Time   WBC 7.0 09/10/2023 2039   RBC 3.79 (L) 09/10/2023 2039   HGB 11.4 (L) 09/10/2023 2039   HGB 11.2 (L) 09/05/2023 1629   HCT 35.3 (L) 09/10/2023 2039   HCT 34.4 (L) 09/05/2023 1629   PLT 193 09/10/2023 2039   PLT 194 09/05/2023 1629   MCV 93.1 09/10/2023 2039   MCV 94 09/05/2023 1629   MCH 30.1 09/10/2023 2039   MCHC 32.3 09/10/2023 2039   RDW 14.6 09/10/2023 2039   RDW 15.1 09/05/2023 1629   LYMPHSABS 1.9 09/10/2023 2039   LYMPHSABS 1.6 09/05/2023 1629   MONOABS 0.6 09/10/2023 2039   EOSABS 0.1 09/10/2023 2039   EOSABS 0.1 09/05/2023 1629   BASOSABS 0.0 09/10/2023 2039   BASOSABS 0.0 09/05/2023 1629    Lithium  Lvl  Date Value Ref Range Status  06/09/2022 1.3 (HH) 0.5 - 1.2 mmol/L Final    Comment:    A concentration of 0.5-0.8 mmol/L is advised for long-term use; concentrations of up to 1.2 mmol/L may be necessary during acute treatment.  Detection Limit = 0.1                           <0.1 indicates None Detected Patient drug level exceeds published reference range.  Evaluate clinically for signs of potential toxicity.      Lab Results  Component Value Date   VALPROATE 79 04/20/2023     .res Assessment: Plan:    Plan:  PDMP  reviewed  Continue:  Decrease Depakote  1500mg  to 1000mg  daily.  Trazadone 100mg  - taking 1/2 tablet daily.  Neuropsych evaluation completed in April.  RTC 3/4 weeks   25 minutes spent dedicated to the care of this patient on the date of this encounter to include pre-visit review of records, ordering of medication, post visit documentation, and face-to-face time with the patient discussing Bipolar Disorder, GAD and insomnia. Discussed continuing current medication regimen.  Patient totally disabled an unable to work with current disabilities - mental health issues Bipolar disorder and recently diagnosed with Parkinson's with Lewy Body Dementia.   Discussed potential metabolic side effects associated with atypical antipsychotics, as well as potential risk for movement side effects. Advised pt to contact office if movement side effects occur.    Patient advised to contact office with any questions, adverse effects, or acute worsening in signs and symptoms.  There are no diagnoses linked to this encounter.   Please see After Visit Summary for patient specific instructions.  Future Appointments  Date Time Provider Department Center  11/14/2023  8:45 AM Campbell Louana POUR, PT [facility]  11/17/2023  9:30 AM Kaylene Lauraine HERO, OT [facility]  11/17/2023 10:15 AM Halpin, Mason K, PT [facility]  11/20/2023  8:00 AM Campbell Louana POUR, PT [facility]  11/27/2023 10:15 AM Kaylene Lauraine HERO, OT [facility]  11/27/2023 11:00 AM Halpin, Mason K, PT [facility]  11/29/2023 10:15 AM Delcie Chiquita BROCKS, OT [facility]  11/29/2023 11:00 AM Deveron Jonette POUR, PT [facility]  12/04/2023 10:15 AM Kaylene Lauraine HERO, OT [facility]  12/06/2023 10:15 AM Kaylene Lauraine HERO, OT [facility]  12/11/2023 10:15 AM Kaylene Lauraine HERO, OT [facility]  12/13/2023 10:15 AM Kaylene Lauraine HERO, OT [facility]  12/19/2023  9:30 AM Kaylene Lauraine HERO, OT [facility]  12/21/2023  8:00 AM Kaylene Lauraine HERO, OT [facility]   12/21/2023 11:10 AM Zollie Lowers, MD [facility] None  01/08/2024 10:30 AM Dohmeier, Dedra, MD [facility] None  02/14/2024  9:00 AM Corina Norleen SAUNDERS, PsyD [facility] CPR  02/15/2024 11:00 AM Corina Norleen SAUNDERS, PsyD [facility] CPR    No orders of the defined types were placed in this encounter.     -------------------------------

## 2023-11-14 ENCOUNTER — Ambulatory Visit: Admitting: Physical Therapy

## 2023-11-14 ENCOUNTER — Encounter: Payer: Self-pay | Admitting: Physical Therapy

## 2023-11-14 DIAGNOSIS — R293 Abnormal posture: Secondary | ICD-10-CM

## 2023-11-14 DIAGNOSIS — R2689 Other abnormalities of gait and mobility: Secondary | ICD-10-CM

## 2023-11-14 DIAGNOSIS — R2681 Unsteadiness on feet: Secondary | ICD-10-CM

## 2023-11-14 DIAGNOSIS — R29818 Other symptoms and signs involving the nervous system: Secondary | ICD-10-CM

## 2023-11-15 ENCOUNTER — Other Ambulatory Visit: Payer: Self-pay | Admitting: Family Medicine

## 2023-11-15 DIAGNOSIS — R41840 Attention and concentration deficit: Secondary | ICD-10-CM

## 2023-11-17 ENCOUNTER — Ambulatory Visit: Attending: Neurology | Admitting: Occupational Therapy

## 2023-11-17 ENCOUNTER — Ambulatory Visit

## 2023-11-17 DIAGNOSIS — R2689 Other abnormalities of gait and mobility: Secondary | ICD-10-CM

## 2023-11-17 DIAGNOSIS — R278 Other lack of coordination: Secondary | ICD-10-CM | POA: Diagnosis present

## 2023-11-17 DIAGNOSIS — R2681 Unsteadiness on feet: Secondary | ICD-10-CM | POA: Diagnosis present

## 2023-11-17 DIAGNOSIS — R41840 Attention and concentration deficit: Secondary | ICD-10-CM | POA: Diagnosis present

## 2023-11-17 DIAGNOSIS — R29818 Other symptoms and signs involving the nervous system: Secondary | ICD-10-CM

## 2023-11-17 DIAGNOSIS — R293 Abnormal posture: Secondary | ICD-10-CM | POA: Insufficient documentation

## 2023-11-17 NOTE — Therapy (Signed)
 OUTPATIENT PHYSICAL THERAPY NEURO TREATMENT   Patient Name: Melvin Roberts MRN: [id number] DOB:06/11/1960, 63 y.o., male Today's Date: 11/17/2023   PCP: [name], MD REFERRING PROVIDER: [name], MD  END OF SESSION:  PT End of Session - 11/17/23 1018     Visit Number 8    Number of Visits 16    Date for PT Re-Evaluation 12/18/23    Authorization Type aetna; no VL    PT Start Time 1015    PT Stop Time 1100    PT Time Calculation (min) 45 min    Equipment Utilized During Treatment Gait belt    Activity Tolerance Patient tolerated treatment well    Behavior During Therapy WFL for tasks assessed/performed            Past Medical History:  Diagnosis Date   Allergy    Bipolar 1 disorder (HCC)    GERD (gastroesophageal reflux disease)    Hyperlipidemia    Hypertension    Past Surgical History:  Procedure Laterality Date   BACK SURGERY  11/17/1995   Dr. Duwayne   COLONOSCOPY     EYE SURGERY     HERNIA REPAIR  04/19/2007   umbilical   UPPER GASTROINTESTINAL ENDOSCOPY  x2   w/ esophageal dilation   Patient Active Problem List   Diagnosis Date Noted   Tremor of both hands 04/20/2023   History of bipolar disorder 04/20/2023   Short-term memory loss 04/20/2023   Cognitive attention deficit 04/20/2023   Ataxia involving legs 04/20/2023   Upper respiratory tract infection 08/17/2020   Vitamin D  deficiency 04/15/2019   BMI 32.0-32.9,adult 04/15/2019   Hypertension 09/16/2013   Bipolar disorder (HCC) 03/11/2013   Allergic rhinitis 03/11/2013   BPH (benign prostatic hyperplasia) 03/11/2013   Hyperlipidemia 05/29/2007   Depression, recurrent (HCC) 05/29/2007   Sinusitis, chronic 05/29/2007   ESOPHAGEAL STRICTURE 05/29/2007   GERD 05/29/2007   HIATAL HERNIA 05/29/2007   LOW BACK PAIN, CHRONIC 05/29/2007    ONSET DATE: 10/02/23  REFERRING DIAG:  R25.1 (ICD-10-CM) - Tremor of both hands  R41.3 (ICD-10-CM) - Short-term memory loss  Z86.59  (ICD-10-CM) - History of bipolar disorder  F33.9 (ICD-10-CM) - Depression, recurrent (HCC    THERAPY DIAG:  Other symptoms and signs involving the nervous system  Unsteadiness on feet  Other abnormalities of gait and mobility  Abnormal posture  Rationale for Evaluation and Treatment: Rehabilitation  SUBJECTIVE:  SUBJECTIVE STATEMENT: Feel like it's getting a little better each time. Denies recent falls.   Pt accompanied by: significant other (spouse: [name])  PERTINENT HISTORY: parkinsonism, bipolar disorder, HTN, hyperlipidemia, knee surgeries 08/2022  PAIN:  Are you having pain? Yes: NPRS scale: no Pain location: low back, knees Pain description: varies Aggravating factors: weather Relieving factors: varies  PRECAUTIONS: Fall  WEIGHT BEARING RESTRICTIONS: No  FALLS: Has patient fallen in last 6 months? Yes. Number of falls 2, in the last month. He was falling daily until getting the rollator RW in the past few months.  LIVING ENVIRONMENT: Lives with: lives with their spouse Lives in: House/apartment Stairs: Yes: External: 5 steps; can uses a rail-- he doesn't get his foot on the step well and going up he catches his foot Has following equipment at home: Walker - 4 wheeled  PLOF: Independent , declining in past year  PATIENT GOALS: walk more steady (without device would be nice)  OBJECTIVE:    TODAY'S TREATMENT: 11/17/23 Activity Comments  NU-step level 5 x 8 min 2 min warm-up -30 sec speed intervals; 30 sec recovery  Staggered stance step-through 2x10 Treadmill rail support, half roll for step over, cues for hip extension  Push-release Trials w/ UE support progressed to no UE support--difficulty with retro-LOB limited hip ext  Hip swings 2# ankle weights x 30 sec For hip  extension priming  Backwards walking  UE support, counted steps, cues for incr ROM        TODAY'S TREATMENT: 11/14/23 Activity Comments  Gait training for safe foot placement within walker wheels, walker close to BOS while walking into clinic, safety with 4WW with transfers   nustep HIIT for neural priming 2 min warm up L1 30 sec L6 fast 30 sec L5 fast 1 min L2 1 min L6 30 sec L4 1 min L2 30 sec fast L6 30 sec L4 1 min [place] L1 Cueing for larger strides/amplitude, maintain exertion during higher levels of resistance   staggered ant/pos wt shift + alt UE swing while maintaining at least 1 UE support on stair rail Demo and verbal/manual cueing to encourage continue wt shift even when adding UE swing   wide BOS lateral wt shift + UE reach  Good ability to coordinating UE/LE movement, more trouble and cueing required to maintain large amplitude movements   standing PWR moves in II bars: up rock twist step Demo and verbal cueing for hand/eye boost. Practiced pivot on toes in isolation. Practice rock to therapy poles as targets to encourage wt shift           PATIENT EDUCATION: Education details: edu on use of mirror and music to maintain consistent amplitude with exercise Person educated: Patient and Spouse Education method: Explanation, Demonstration, Tactile cues, and Verbal cues Education comprehension: verbalized understanding      HOME EXERCISE PROGRAM Last updated: 10/23/23 Sitting PWR moves 2x10 each/day Access Code: [email or web address] URL: [email or web address] Date: 11/17/2023 Prepared by: Burnard Sandifer  Exercises - Staggered Stance Step Throughs  - 1 x daily - 7 x weekly - 3 sets - 10 reps - Backward Walking with Counter Support  - 1 x daily - 7 x weekly - 3 sets - 10 reps    Note: Objective measures were completed at Evaluation unless otherwise noted. DIAGNOSTIC FINDINGS: DAT scan, NPH assessment  COGNITION: Overall cognitive status: Impaired; he  started aricept , also notes hearing declined significantly in last year   SENSATION: L hand  COORDINATION: RAM mildly ataxic, symmetrical with  FTN and opposition  MUSCLE LENGTH: Hamstrings: tightness noted bilaterally  POSTURE: No Significant postural limitations  LOWER EXTREMITY ROM:    WFLs  LOWER EXTREMITY MMT:   MMT Right Eval Left Eval  Hip flexion 5/5 5/5  Hip extension    Hip abduction    Hip adduction    Hip internal rotation    Hip external rotation    Knee flexion 5/5 5/5  Knee extension 5/5 5/5  Ankle dorsiflexion 4-/5 5/5  Ankle plantarflexion    Ankle inversion    Ankle eversion    (Blank rows = not tested)  BED MOBILITY:  Findings: Sit to supine Complete Independence  TRANSFERS: Sit to stand: Complete Independence  Assistive device utilized: to rollator RW      STAIRS: TBA-- has 5 at home  GAIT: Findings: Gait Characteristics: decreased step length- Right, decreased step length- Left, decreased stride length, decreased ankle dorsiflexion- Right, decreased ankle dorsiflexion- Left, shuffling, poor foot clearance- Right, and poor foot clearance- Left, Distance walked: 100 ft, Assistive device utilized:Walker - 4 wheeled and None, Level of assistance: SBA, and Comments: Patient ambulates with frequent festinating patterns and reports at times freezing for 1 minute in the home Gait speed=1.28 ft/sec without device Gait speed=1.76 ft/sec with rollator  FUNCTIONAL TESTS:  5 times sit to stand: 22.92 Timed up and go (TUG): 25.22, TUG cognitive is 33.60 seconds with diminished accuracy in counting Berg Balance Scale:  Item Test date: 10/19/23 Test date:  Test date:   Sitting to standing 4. able to stand without using hands and stabilize independently Insert OPRCBERGREEVAL SmartPhrase at re-test date Insert OPRCBERGREEVAL SmartPhrase at re-test date  2. Standing unsupported 4. able to stand safely for 2 minutes    3. Sitting with back unsupported, feet  supported 4. able to sit safely and securely for 2 minutes    4. Standing to sitting 4. sits safely with minimal use of hands    5. Pivot transfer  4. able to transfer safely with minor use of hands    6. Standing unsupported with eyes closed 4. able to stand 10 seconds safely    7. Standing unsupported with feet together 3. able to place feet together independently and stand 1 minute with supervision    8. Reaching forward with outstretched arms while standing 2. can reach forward 5 cm (2 inches)    9. Pick up object from the floor from standing 3. able to pick up slipper but needs supervision    10. Turning to look behind over left and right shoulders while standing 2. turns sideways but only maintains balance    11. Turn 360 degrees 1. needs close supervision or verbal cuing    12. Place alternate foot on step or stool while standing unsupported 0. needs assistance to keep from falling/unable to try    13. Standing unsupported one foot in front 2. able to take small step independently and hold 30 seconds    14. Standing on one leg 1. tries to lift leg unable to hold 3 seconds but remains standing independently.      Total Score 38/56 Total Score x /56 Total Score x/56   Turning= 16 steps to the R and 22 steps to the L  PATIENT SURVEYS:  Freezing of gait questionnaire Not performed  [facility] Adult PT Treatment:                                                DATE: 10/19/23 Gait: Discussed tips to reduce freezing and practiced in session. Tips to reduce freezing episodes with standing or walking:  Stand tall with your feet wide, so that you can rock and weight shift through your hips. Don't try to fight the freeze: if you begin taking slower, faster, smaller steps, STOP, get your posture tall, and RESET your posture and balance.  Take a deep breath before taking the BIG step  to start again. March in place, with high knee stepping, to get started walking again. Use auditory cues:  Count out loud, think of a familiar tune or song or cadence, use pocket metronome, to use rhythm to get started walking again. Use visual cues:  Use a line to step over, use laser pointer line to step over, (using BIG steps) to start walking again. Use visual targets to keep your posture tall (look ahead and focus on an object or target at eye level). As you approach where your destination with walking, count your steps out loud and/or focus on your target with your eyes until you are fully there. Use appropriate assistive device, as advised by your physical therapist to assist with taking longer, consistent steps.   PATIENT EDUCATION: Education details: Nature of PT, goals for therapy, tips to reduce freezing. Person educated: Patient and Spouse Education method: Explanation, Demonstration, and Handouts Education comprehension: verbalized understanding and returned demonstration  HOME EXERCISE PROGRAM: None established-- provided tips for freezing  GOALS: Goals reviewed with patient? Yes  SHORT TERM GOALS: Target date: 11/18/23  The patient will perform HEP with wife's assist. Baseline: initiated at eval Goal status: IN PROGRESS  2.  The patient will improve gait speed to 2.5 ft/sec with rollater demonstrating dec'd festination. Baseline:  1.76 ft/sec Goal status: IN PROGRESS  3.   The patient will improve Berg to > or equal to 42/56.  Baseline:  38/56 Goal status: IN PROGRESS  4.  The patient will move floor<>stand with UE support due to h/o falls. Baseline:  PT to further assess Goal status: IN PROGRESS  LONG TERM GOALS: Target date: 12/19/23  The patient will have progression of HEP with wife's assist Baseline:  initiated at eval Goal status: IN PROGRESS  2.  The patient will improve freezing of gait by 5 points to demonstrate improved functional  abilities. Baseline: 17/24 10/23/23 Goal status: IN PROGRESS  3.  The patient will improve Berg to > or equal to 46/56. Baseline:  38/56 Goal status: IN PROGRESS  4.  The patient will improve TUG score < or equal to 20 seconds. Baseline:  25.22 seconds Goal status: IN PROGRESS  5.  The patient will improve 5 time sit<>stand to < 18 seconds. Baseline: 22.92 seconds Goal status: IN PROGRESS  6.  The patient will report reduction in falls to < or equal to 2x/month. Baseline:  varies-- has been as much as daily to 2x/month this year Goal status: IN PROGRESS  ASSESSMENT:  CLINICAL IMPRESSION: NU-step for rapid alternating movement w/ cues for larger amplitude step length. Pre-gait activities to promote weight shifting and larger amplitude/ROM step length especially for hip extension to facilitate terminal stance and use of visual obstacle to help sequence for length/height.  Trials of push-release to facilitate righting reactions with small, multiple steps needed for anterior LOB and retro-LOB w/ righting response present but not very functional for posterior direction, trials of activities to promote increased hip extension with improved performance thereafter needing 2 small steps for backwards LOB.  Update to HEP to address these facets. Continued sessions to advance POC details to improve mobility and reduce risk for falls.  OBJECTIVE IMPAIRMENTS: Abnormal gait, decreased balance, decreased coordination, decreased knowledge of use of DME, decreased mobility, difficulty walking, increased fascial restrictions, impaired flexibility, postural dysfunction, and pain.   ACTIVITY LIMITATIONS: carrying, lifting, bending, standing, transfers, bed mobility, and locomotion level  PARTICIPATION LIMITATIONS: cleaning, personal finances, driving, community activity, and occupation  PERSONAL FACTORS: 3+ comorbidities: see above are also affecting patient's functional outcome.   REHAB POTENTIAL:  Good  CLINICAL DECISION MAKING: Unstable/unpredictable  EVALUATION COMPLEXITY: High  PLAN:  PT FREQUENCY: 2x/week  PT DURATION: 8 weeks  PLANNED INTERVENTIONS: 97164- PT Re-evaluation, 97750- Physical Performance Testing, 97110-Therapeutic exercises, 97530- Therapeutic activity, [id number]- Neuromuscular re-education, 97535- Self Care, 02859- Manual therapy, [phone]- Gait training, Patient/Family education, Balance training, Stair training, and DME instructions  PLAN FOR NEXT SESSION: PWR! Exercises working initially on turns and reducing freezing during gait.  *adjust height of walker   12:03 PM, 11/17/23 M. Kelly Jalayla Chrismer, PT, DPT Physical Therapist- [place] Office Number: [phone]

## 2023-11-17 NOTE — Therapy (Signed)
 OUTPATIENT OCCUPATIONAL THERAPY [facility]  Treatment Note  Patient Name: Melvin Roberts MRN: [id number] DOB:1960/05/29, 64 y.o., male Today's Date: 11/17/2023  PCP: Zollie Lowers, MD REFERRING PROVIDER: Dohmeier, Dedra, MD  END OF SESSION:  OT End of Session - 11/17/23 0943     Visit Number 2    Number of Visits 13    Date for OT Re-Evaluation 12/15/23    Authorization Type Aetna [id number]    OT Start Time 0935    OT Stop Time 1015    OT Time Calculation (min) 40 min           Past Medical History:  Diagnosis Date   Allergy    Bipolar 1 disorder (HCC)    GERD (gastroesophageal reflux disease)    Hyperlipidemia    Hypertension    Past Surgical History:  Procedure Laterality Date   BACK SURGERY  11/17/1995   Dr. Duwayne   COLONOSCOPY     EYE SURGERY     HERNIA REPAIR  04/19/2007   umbilical   UPPER GASTROINTESTINAL ENDOSCOPY  x2   w/ esophageal dilation   Patient Active Problem List   Diagnosis Date Noted   Tremor of both hands 04/20/2023   History of bipolar disorder 04/20/2023   Short-term memory loss 04/20/2023   Cognitive attention deficit 04/20/2023   Ataxia involving legs 04/20/2023   Upper respiratory tract infection 08/17/2020   Vitamin D  deficiency 04/15/2019   BMI 32.0-32.9,adult 04/15/2019   Hypertension 09/16/2013   Bipolar disorder (HCC) 03/11/2013   Allergic rhinitis 03/11/2013   BPH (benign prostatic hyperplasia) 03/11/2013   Hyperlipidemia 05/29/2007   Depression, recurrent (HCC) 05/29/2007   Sinusitis, chronic 05/29/2007   ESOPHAGEAL STRICTURE 05/29/2007   GERD 05/29/2007   HIATAL HERNIA 05/29/2007   LOW BACK PAIN, CHRONIC 05/29/2007    ONSET DATE: referral date 10/02/23  REFERRING DIAG: R25.1 (ICD-10-CM) - Tremor of both hands R41.3 (ICD-10-CM) - Short-term memory loss Z86.59 (ICD-10-CM) - History of bipolar disorder F33.9 (ICD-10-CM) - Depression, recurrent  THERAPY DIAG:  Other symptoms and signs involving the nervous  system  Attention and concentration deficit  Other lack of coordination  Unsteadiness on feet  Rationale for Evaluation and Treatment: Rehabilitation  SUBJECTIVE:   SUBJECTIVE STATEMENT: Pt reports making slow progresses with PT.  Pt accompanied by: self and significant other (spouse [name])  PERTINENT HISTORY: parkinsonism, bipolar disorder, HTN, hyperlipidemia  PRECAUTIONS: Fall  WEIGHT BEARING RESTRICTIONS: No  PAIN:  Are you having pain? No  FALLS: Has patient fallen in last 6 months? Yes. Number of falls 6-8, but less since using Rollator end of May  LIVING ENVIRONMENT: Lives with: lives with their spouse Lives in: Mobile home Stairs: Yes: External: 4-5 steps; can reach both Has following equipment at home: Single point cane, Walker - 4 wheeled, shower chair, and bed assist rail  PLOF: Independent and Independent with basic ADLs  PATIENT GOALS: better balance to walk better  OBJECTIVE:  Note: Objective measures were completed at Evaluation unless otherwise noted.  HAND DOMINANCE: Right  ADLs: Overall ADLs: increased time for self-care tasks, about twice as long as before  Transfers/ambulation related to ADLs: Eating: increased spillage when eating and carrying cup, uses larger spoon Grooming: Mod I UB Dressing: Mod I LB Dressing: occasional difficulty with tying shoes, is now wearing [employer] slip ins, increased time with donning socks Toileting: Mod I Bathing: Mod I Tub Shower transfers: Mod I Equipment: Shower seat with back and Walk in shower  IADLs: Light housekeeping: used  to load and unload dishwasher but has not been feeling up to it Meal Prep: will occasionally make a sandwich Community mobility: not driving as pt would veer to right when driving Medication management: spouse fills pill box Handwriting: 75% legible, Mild micrographia, and PPT#1 handwriting (whales live in [place]): 17.87 seconds. Pt writing in all caps when writing in  print.  Mild micrographia, but remained same size from start to finish.  MOBILITY STATUS: wide shuffling gait, he has noted some freezing - as if he has to tell his body to move and its doesn't do what he wants.  POSTURE COMMENTS:  Mild Right lean  ACTIVITY TOLERANCE: Activity tolerance: diminished  FUNCTIONAL OUTCOME MEASURES: Physical performance test: PPT#2 (simulated eating) 14.32 sec - 1 drop  COORDINATION: Finger Nose Finger test: tremors noted bilaterally in fingers and forearm, however not impacting motor control.  Slower overall bilaterally 9 Hole Peg test: Right: 30.81 sec; Left: 39.18 sec Box and Blocks:  Right 52 blocks, Left 50 blocks RAM: decreased quality of movement, requiring tactile cues of thighs  UE ROM:  WFL, B shoulder limited ~140-150*  UE MMT:   WFL  SENSATION: Reports occasional tingling in fingers on L hand  COGNITION: Overall cognitive status: Within functional limits for tasks assessed and pt reports memory is not as good as it was before, also difficulty with dual tasking  OBSERVATIONS: Bradykinesia                                                                                                                    TREATMENT DATE:  11/17/23 Large amplitude: OT instructed pt in large amplitude hand exercises (see pt instructions) providing pt with demonstration and cues for large amplitude.  Pt still with mild tremor L > R with hand movements. Function reach: engaged in reaching for cones in cabinet with RUE and LUE with focus on large amplitude reaching, full grasp, and large hand opening when releasing cones.  OT educating on increased contact/surface area of hand on cone to improve motor control and keep task active to reduce tremors.  Pt demonstrating good carryover with improved motor control bilaterally. 9 hole ezh:Mphyu: 26.63 sec, Left: 31.72 sec.  Pt demonstrating improved coordination bilaterally.  OT reiterating use of large amplitude/PWR hands  prior to completing fine motor control tasks to increase coordination/control.   10/19/23 Educated on large amplitude movements to aid in increased quality of movements with fine and gross motor tasks.      PATIENT EDUCATION: Education details: large amplitude hands for gross and fine motor control Person educated: Patient and Spouse Education method: Explanation, Demonstration, Verbal cues, and Handouts Education comprehension: verbalized understanding and needs further education  HOME EXERCISE PROGRAM: PWR hands (see pt instructions)  GOALS: Goals reviewed with patient? Yes  SHORT TERM GOALS: Target date: 11/10/23  Pt will be Independent with PD specific HEP Baseline: new to OPOT Goal status: in progress  2.  Pt will verbalize understanding of task modifications, adapted strategies, and/or potential  DME/AE needs to increase ease, safety, and independence w/ ADLs/IADLs. Baseline: not engaging in IADLs at previous level, and increased time for ADLs Goal status: in progress  3.  Pt will demonstrate improved fine motor coordination for ADLs as evidenced by decreasing 9 hole peg test score for RUE/LUE by 5 secs Baseline: 9 Hole Peg test: Right: 30.81 sec; Left: 39.18 sec 11/17/23: Right: 26.63 sec, Left: 31.72 sec Goal status: Partially met - Met on Left    LONG TERM GOALS: Target date: 12/15/23  Pt will demonstrate improved ease with feeding as evidenced by decreasing PPT#2 (self feeding) by 3 secs Baseline: 14.32 sec Goal status: in progress  2.  Pt will demonstrate improved UE functional use for ADLs as evidenced by increasing box/ blocks score by 5 blocks with RUE/LUE Baseline: R: 52 and L: 50 Goal status: in progress  3.  Pt will demonstrate understanding of memory compensations and ways to keep thinking skills sharp Baseline: impaired memory Goal status: in progress  4.  Pt will demonstrate ability to walk 20' while carrying cup without any spillage. Baseline: spilling  cup of water with ambulation Goal status: in progress  ASSESSMENT:  CLINICAL IMPRESSION: Patient is a 63 y.o. male who was seen today for occupational therapy treatment session for parkinsonism with tremors.  Pt with shuffling gait as walking into treatment gym, however with cues for increased step length and amplitude pt with improved gait pattern and speed. Pt with resting tremors in BUE (L>R), however demonstrating good understanding of large amplitude exercises and carryover during structured tasks and 9 hole peg test.  Pt will continue to benefit from skilled occupational therapy services to address strength and coordination,  balance, GM/FM control, cognition, safety awareness, introduction of compensatory strategies/AE prn, and implementation of an HEP to improve participation and safety during ADLs, IADLs, and quality of life.    PERFORMANCE DEFICITS: in functional skills including ADLs, IADLs, coordination, dexterity, sensation, ROM, strength, Fine motor control, Gross motor control, balance, body mechanics, endurance, decreased knowledge of precautions, decreased knowledge of use of DME, and UE functional use, cognitive skills including memory and safety awareness, and psychosocial skills including coping strategies, environmental adaptation, and routines and behaviors.   IMPAIRMENTS: are limiting patient from ADLs, IADLs, and leisure.     PLAN:  OT FREQUENCY: 1-2x/week  OT DURATION: 6 weeks (asking 8 weeks for scheduling)  PLANNED INTERVENTIONS: 97168 OT Re-evaluation, 97535 self care/ADL training, 02889 therapeutic exercise, 97530 therapeutic activity, 97112 neuromuscular re-education, 97750 Physical Performance Testing, balance training, functional mobility training, psychosocial skills training, energy conservation, coping strategies training, patient/family education, and DME and/or AE instructions  RECOMMENDED OTHER SERVICES: PT  CONSULTED AND AGREED WITH PLAN OF CARE:  Patient and family member/caregiver  PLAN FOR NEXT SESSION: initiate PD HEP with focus on large amplitude movements - bag exercises, coordination HEP, educate on memory/thinking skills, attempt dual tasking with seated progressing to standing and ambulating   Evelio Rueda, OTR/L 11/17/2023, 9:43 AM  [facility] at [facility] [address], [address] [place], [place] [address] Phone # [phone] Fax # [phone]

## 2023-11-17 NOTE — Patient Instructions (Signed)
 PWR! Hands  Then, start with elbows bent and hands closed, perform the following: PWR! Up: Close hands and flick fingers open and apart BIG PWR! Rock: Move wrists up and down [place]! Twist: Twist palms up and down BIG PWR! Step: Touch index finger to thumb while keeping other fingers straight. Flick fingers out BIG (thumb out/straighten fingers). Repeat with other fingers. (Step your thumb to each finger). PWR! Hands: Push hands out BIG. Elbows straight, wrists up, fingers open and spread apart BIG. (Can also perform by pushing down on table, chair, knees. Push above head, out to the side, behind you, in front of you.)   ** Make each movement big and deliberate so that you feel the movement.  Perform at least 10 repetitions 1x/day, but perform PWR! hands throughout the day when you are having trouble using your hands (picking up/manipulating small objects, writing, eating, typing, sewing, buttoning, etc.).

## 2023-11-17 NOTE — Therapy (Signed)
 OUTPATIENT PHYSICAL THERAPY NEURO TREATMENT   Patient Name: Melvin Roberts MRN: [id number] DOB:[birth year], 63 y.o., male Today's Date: 11/20/2023   PCP: [name], MD REFERRING PROVIDER: [name], MD  END OF SESSION:  PT End of Session - 11/20/23 0844     Visit Number 9    Number of Visits 16    Date for PT Re-Evaluation 12/18/23    Authorization Type aetna; no VL    PT Start Time 0801    PT Stop Time 0843    PT Time Calculation (min) 42 min    Equipment Utilized During Treatment Gait belt    Activity Tolerance Patient tolerated treatment well    Behavior During Therapy WFL for tasks assessed/performed             Past Medical History:  Diagnosis Date   Allergy    Bipolar 1 disorder (HCC)    GERD (gastroesophageal reflux disease)    Hyperlipidemia    Hypertension    Past Surgical History:  Procedure Laterality Date   BACK SURGERY  11/17/1995   Dr. Duwayne   COLONOSCOPY     EYE SURGERY     HERNIA REPAIR  04/19/2007   umbilical   UPPER GASTROINTESTINAL ENDOSCOPY  x2   w/ esophageal dilation   Patient Active Problem List   Diagnosis Date Noted   Tremor of both hands 04/20/2023   History of bipolar disorder 04/20/2023   Short-term memory loss 04/20/2023   Cognitive attention deficit 04/20/2023   Ataxia involving legs 04/20/2023   Upper respiratory tract infection 08/17/2020   Vitamin D  deficiency 04/15/2019   BMI 32.0-32.9,adult 04/15/2019   Hypertension 09/16/2013   Bipolar disorder (HCC) 03/11/2013   Allergic rhinitis 03/11/2013   BPH (benign prostatic hyperplasia) 03/11/2013   Hyperlipidemia 05/29/2007   Depression, recurrent (HCC) 05/29/2007   Sinusitis, chronic 05/29/2007   ESOPHAGEAL STRICTURE 05/29/2007   GERD 05/29/2007   HIATAL HERNIA 05/29/2007   LOW BACK PAIN, CHRONIC 05/29/2007    ONSET DATE: 10/02/23  REFERRING DIAG:  R25.1 (ICD-10-CM) - Tremor of both hands  R41.3 (ICD-10-CM) - Short-term memory loss  Z86.59  (ICD-10-CM) - History of bipolar disorder  F33.9 (ICD-10-CM) - Depression, recurrent (HCC    THERAPY DIAG:  Other symptoms and signs involving the nervous system  Unsteadiness on feet  Other abnormalities of gait and mobility  Abnormal posture  Rationale for Evaluation and Treatment: Rehabilitation  SUBJECTIVE:  SUBJECTIVE STATEMENT: My [employer] is building a ramp for me.   Pt accompanied by: self (spouse: [name])  PERTINENT HISTORY: parkinsonism, bipolar disorder, HTN, hyperlipidemia, knee surgeries 08/2022  PAIN:  Are you having pain? Yes: NPRS scale: no Pain location: low back, knees Pain description: varies Aggravating factors: weather Relieving factors: varies  PRECAUTIONS: Fall  WEIGHT BEARING RESTRICTIONS: No  FALLS: Has patient fallen in last 6 months? Yes. Number of falls 2, in the last month. He was falling daily until getting the rollator RW in the past few months.  LIVING ENVIRONMENT: Lives with: lives with their spouse Lives in: House/apartment Stairs: Yes: External: 5 steps; can uses a rail-- he doesn't get his foot on the step well and going up he catches his foot Has following equipment at home: Walker - 4 wheeled  PLOF: Independent , declining in past year  PATIENT GOALS: walk more steady (without device would be nice)  OBJECTIVE:     TODAY'S TREATMENT: 11/20/23 Activity Comments  nustep HIIT for neural priming 2 min warm up L1 30 sec L6 fast 30 sec L5 fast 1 min L2 1 min L6 30 sec L4 1 min L2 30 sec fast L6 30 sec L4 1 min cooldown L1 Cueing for long strides throughout  fwd step to target + B UE reach  Performed without UEs first. Required cueing for fwd wt shift onto stepping LE  backward step to target + B UE push Performed without UEs first.  Required cueing for back wt shift onto stepping LE. More difficulty wt shifting and stepping onto the R LE   gait training with TB across 4WW as visual cue [id number] Excellent improvement in step length and consistency of steps. Requires additional cueing to maintain continuity and size of steps with turns. Tendency to look down at feet throughout when walking   STS + immediate fwd step 10x  Discourage several small corrective steps and instead promoted 2 large steps to complete activity   figure 8 turns with 4WW Focus for large and continuous steps, tall posture      HOME EXERCISE PROGRAM Last updated: 10/23/23 Sitting PWR moves 2x10 each/day     Note: Objective measures were completed at Evaluation unless otherwise noted. DIAGNOSTIC FINDINGS: DAT scan, NPH assessment  COGNITION: Overall cognitive status: Impaired; he started aricept , also notes hearing declined significantly in last year   SENSATION: L hand  COORDINATION: RAM mildly ataxic, symmetrical with FTN and opposition  MUSCLE LENGTH: Hamstrings: tightness noted bilaterally  POSTURE: No Significant postural limitations  LOWER EXTREMITY ROM:    WFLs  LOWER EXTREMITY MMT:   MMT Right Eval Left Eval  Hip flexion 5/5 5/5  Hip extension    Hip abduction    Hip adduction    Hip internal rotation    Hip external rotation    Knee flexion 5/5 5/5  Knee extension 5/5 5/5  Ankle dorsiflexion 4-/5 5/5  Ankle plantarflexion    Ankle inversion    Ankle eversion    (Blank rows = not tested)  BED MOBILITY:  Findings: Sit to supine Complete Independence  TRANSFERS: Sit to stand: Complete Independence  Assistive device utilized: to rollator RW      STAIRS: TBA-- has 5 at home  GAIT: Findings: Gait Characteristics: decreased step length- Right, decreased step length- Left, decreased stride length, decreased ankle dorsiflexion- Right, decreased ankle dorsiflexion- Left, shuffling, poor foot clearance- Right, and poor foot  clearance- Left, Distance walked: 100 ft, Assistive device utilized:Walker - 4  wheeled and None, Level of assistance: SBA, and Comments: Patient ambulates with frequent festinating patterns and reports at times freezing for 1 minute in the home Gait speed=1.28 ft/sec without device Gait speed=1.76 ft/sec with rollator  FUNCTIONAL TESTS:  5 times sit to stand: 22.92 Timed up and go (TUG): 25.22, TUG cognitive is 33.60 seconds with diminished accuracy in counting Berg Balance Scale:  Item Test date: 10/19/23 Test date:  Test date:   Sitting to standing 4. able to stand without using hands and stabilize independently Insert OPRCBERGREEVAL SmartPhrase at re-test date Insert OPRCBERGREEVAL SmartPhrase at re-test date  2. Standing unsupported 4. able to stand safely for 2 minutes    3. Sitting with back unsupported, feet supported 4. able to sit safely and securely for 2 minutes    4. Standing to sitting 4. sits safely with minimal use of hands    5. Pivot transfer  4. able to transfer safely with minor use of hands    6. Standing unsupported with eyes closed 4. able to stand 10 seconds safely    7. Standing unsupported with feet together 3. able to place feet together independently and stand 1 minute with supervision    8. Reaching forward with outstretched arms while standing 2. can reach forward 5 cm (2 inches)    9. Pick up object from the floor from standing 3. able to pick up slipper but needs supervision    10. Turning to look behind over left and right shoulders while standing 2. turns sideways but only maintains balance    11. Turn 360 degrees 1. needs close supervision or verbal cuing    12. Place alternate foot on step or stool while standing unsupported 0. needs assistance to keep from falling/unable to try    13. Standing unsupported one foot in front 2. able to take small step independently and hold 30 seconds    14. Standing on one leg 1. tries to lift leg unable to hold 3 seconds but  remains standing independently.      Total Score 38/56 Total Score x /56 Total Score x/56   Turning= 16 steps to the R and 22 steps to the L  PATIENT SURVEYS:  Freezing of gait questionnaire Not performed                                                                                                                              [facility] Adult PT Treatment:                                                DATE: 10/19/23 Gait: Discussed tips to reduce freezing and practiced in session. Tips to reduce freezing episodes with standing or walking:  Stand tall with your feet wide, so that you can rock and weight shift through your  hips. Don't try to fight the freeze: if you begin taking slower, faster, smaller steps, STOP, get your posture tall, and RESET your posture and balance.  Take a deep breath before taking the BIG step to start again. March in place, with high knee stepping, to get started walking again. Use auditory cues:  Count out loud, think of a familiar tune or song or cadence, use pocket metronome, to use rhythm to get started walking again. Use visual cues:  Use a line to step over, use laser pointer line to step over, (using BIG steps) to start walking again. Use visual targets to keep your posture tall (look ahead and focus on an object or target at eye level). As you approach where your destination with walking, count your steps out loud and/or focus on your target with your eyes until you are fully there. Use appropriate assistive device, as advised by your physical therapist to assist with taking longer, consistent steps.   PATIENT EDUCATION: Education details: Nature of PT, goals for therapy, tips to reduce freezing. Person educated: Patient and Spouse Education method: Explanation, Demonstration, and Handouts Education comprehension: verbalized understanding and returned demonstration  HOME EXERCISE PROGRAM: None established-- provided tips for freezing  GOALS: Goals reviewed  with patient? Yes  SHORT TERM GOALS: Target date: 11/18/23  The patient will perform HEP with wife's assist. Baseline: initiated at eval Goal status: IN PROGRESS  2.  The patient will improve gait speed to 2.5 ft/sec with rollater demonstrating dec'd festination. Baseline:  1.76 ft/sec Goal status: IN PROGRESS  3.   The patient will improve Berg to > or equal to 42/56.  Baseline:  38/56 Goal status: IN PROGRESS  4.  The patient will move floor<>stand with UE support due to h/o falls. Baseline:  PT to further assess Goal status: IN PROGRESS  LONG TERM GOALS: Target date: 12/19/23  The patient will have progression of HEP with wife's assist Baseline:  initiated at eval Goal status: IN PROGRESS  2.  The patient will improve freezing of gait by 5 points to demonstrate improved functional abilities. Baseline: 17/24 10/23/23 Goal status: IN PROGRESS  3.  The patient will improve Berg to > or equal to 46/56. Baseline:  38/56 Goal status: IN PROGRESS  4.  The patient will improve TUG score < or equal to 20 seconds. Baseline:  25.22 seconds Goal status: IN PROGRESS  5.  The patient will improve 5 time sit<>stand to < 18 seconds. Baseline: 22.92 seconds Goal status: IN PROGRESS  6.  The patient will report reduction in falls to < or equal to 2x/month. Baseline:  varies-- has been as much as daily to 2x/month this year Goal status: IN PROGRESS  ASSESSMENT:  CLINICAL IMPRESSION: Patient arrived to session without complaints. Worked on use of visual targets to promote longer step length. Patient demonstrated good effort to correct according to cueing, but R LE appears to be more limited in step length and weight shift. Patient with good improvement in step length with use of visual cue on 4WW to improve step length and consistency of stepping. No complaints at end of session.   OBJECTIVE IMPAIRMENTS: Abnormal gait, decreased balance, decreased coordination, decreased knowledge of use  of DME, decreased mobility, difficulty walking, increased fascial restrictions, impaired flexibility, postural dysfunction, and pain.   ACTIVITY LIMITATIONS: carrying, lifting, bending, standing, transfers, bed mobility, and locomotion level  PARTICIPATION LIMITATIONS: cleaning, personal finances, driving, community activity, and occupation  PERSONAL FACTORS: 3+ comorbidities: see above are  also affecting patient's functional outcome.   REHAB POTENTIAL: Good  CLINICAL DECISION MAKING: Unstable/unpredictable  EVALUATION COMPLEXITY: High  PLAN:  PT FREQUENCY: 2x/week  PT DURATION: 8 weeks  PLANNED INTERVENTIONS: 97164- PT Re-evaluation, 97750- Physical Performance Testing, 97110-Therapeutic exercises, 97530- Therapeutic activity, [id number]- Neuromuscular re-education, 97535- Self Care, [id number]- Manual therapy, [phone]- Gait training, Patient/Family education, Balance training, Stair training, and DME instructions  PLAN FOR NEXT SESSION: PWR! Exercises working initially on turns and reducing freezing during gait.  *adjust height of walker   Louana Terrilyn Christians, PT, DPT 11/20/23 8:45 AM  [facility] at [facility] [address] [place], [address] [place], [place] [address] Phone # [phone] Fax # [phone]

## 2023-11-20 ENCOUNTER — Encounter: Payer: Self-pay | Admitting: Physical Therapy

## 2023-11-20 ENCOUNTER — Ambulatory Visit: Admitting: Physical Therapy

## 2023-11-20 ENCOUNTER — Ambulatory Visit: Admitting: Family Medicine

## 2023-11-20 DIAGNOSIS — R29818 Other symptoms and signs involving the nervous system: Secondary | ICD-10-CM | POA: Diagnosis not present

## 2023-11-20 DIAGNOSIS — R293 Abnormal posture: Secondary | ICD-10-CM

## 2023-11-20 DIAGNOSIS — R2689 Other abnormalities of gait and mobility: Secondary | ICD-10-CM

## 2023-11-20 DIAGNOSIS — R2681 Unsteadiness on feet: Secondary | ICD-10-CM

## 2023-11-20 NOTE — Telephone Encounter (Signed)
 The 100 mg strength did not lower the Uric acid low enough to prevent gout.

## 2023-11-22 NOTE — Telephone Encounter (Signed)
 Left detailed message making patient aware of Dr Zollie response to his/spouse question on his Allopurinol  dosage increase and advised for them to call the office if they had any other questions/concerns.

## 2023-11-27 ENCOUNTER — Ambulatory Visit

## 2023-11-27 ENCOUNTER — Ambulatory Visit: Admitting: Occupational Therapy

## 2023-11-27 DIAGNOSIS — R2689 Other abnormalities of gait and mobility: Secondary | ICD-10-CM

## 2023-11-27 DIAGNOSIS — R29818 Other symptoms and signs involving the nervous system: Secondary | ICD-10-CM | POA: Diagnosis not present

## 2023-11-27 DIAGNOSIS — R2681 Unsteadiness on feet: Secondary | ICD-10-CM

## 2023-11-27 DIAGNOSIS — R278 Other lack of coordination: Secondary | ICD-10-CM

## 2023-11-27 DIAGNOSIS — R293 Abnormal posture: Secondary | ICD-10-CM

## 2023-11-27 NOTE — Therapy (Signed)
 OUTPATIENT OCC[facility]ATIONAL THERAPY [facility]  Treatment Note  Patient Name: Melvin Roberts MRN: [id number] DOB:[birth year], 63 y.o., male Today's Date: 11/27/2023  PCP: Zollie Lowers, MD REFERRING PROVIDER: Dohmeier, Dedra, MD  END OF SESSION:  OT End of Session - 11/27/23 1020     Visit Number 3    Number of Visits 13    Date for OT Re-Evaluation 12/15/23    Authorization Type [employer] [id number]    OT Start Time 1018    OT Stop Time 1100    OT Time Calculation (min) 42 min            Past Medical History:  Diagnosis Date   Allergy    Bipolar 1 disorder (HCC)    GERD (gastroesophageal reflux disease)    Hyperlipidemia    Hypertension    Past Surgical History:  Procedure Laterality Date   BACK SURGERY  11/17/1995   Dr. Duwayne   COLONOSCOPY     EYE SURGERY     HERNIA REPAIR  04/19/2007   umbilical   UPPER GASTROINTESTINAL ENDOSCOPY  x2   w/ esophageal dilation   Patient Active Problem List   Diagnosis Date Noted   Tremor of both hands 04/20/2023   History of bipolar disorder 04/20/2023   Short-term memory loss 04/20/2023   Cognitive attention deficit 04/20/2023   Ataxia involving legs 04/20/2023   Upper respiratory tract infection 08/17/2020   Vitamin D  deficiency 04/15/2019   BMI 32.0-32.9,adult 04/15/2019   Hypertension 09/16/2013   Bipolar disorder (HCC) 03/11/2013   Allergic rhinitis 03/11/2013   BPH (benign prostatic hyperplasia) 03/11/2013   Hyperlipidemia 05/29/2007   Depression, recurrent (HCC) 05/29/2007   Sinusitis, chronic 05/29/2007   ESOPHAGEAL STRICTURE 05/29/2007   GERD 05/29/2007   HIATAL HERNIA 05/29/2007   LOW BACK PAIN, CHRONIC 05/29/2007    ONSET DATE: referral date 10/02/23  REFERRING DIAG: R25.1 (ICD-10-CM) - Tremor of both hands R41.3 (ICD-10-CM) - Short-term memory loss Z86.59 (ICD-10-CM) - History of bipolar disorder F33.9 (ICD-10-CM) - Depression, recurrent  THERAPY DIAG:  Other symptoms and signs involving the nervous  system  Unsteadiness on feet  Other abnormalities of gait and mobility  Abnormal posture  Other lack of coordination  Rationale for Evaluation and Treatment: Rehabilitation  SUBJECTIVE:   SUBJECTIVE STATEMENT: Pt reports going to the beach over the weekend, he was able to walk on the beach.  Pt accompanied by: self and significant other (spouse [name], dropped off)  PERTINENT HISTORY: parkinsonism, bipolar disorder, HTN, hyperlipidemia  PRECAUTIONS: Fall  WEIGHT BEARING RESTRICTIONS: No  PAIN:  Are you having pain? No  FALLS: Has patient fallen in last 6 months? Yes. Number of falls 6-8, but less since using Rollator end of May  LIVING ENVIRONMENT: Lives with: lives with their spouse Lives in: Mobile home Stairs: Yes: External: 4-5 steps; can reach both Has following equipment at home: Single point cane, Walker - 4 wheeled, shower chair, and bed assist rail  PLOF: Independent and Independent with basic ADLs  PATIENT GOALS: better balance to walk better  OBJECTIVE:  Note: Objective measures were completed at Evaluation unless otherwise noted.  HAND DOMINANCE: Right  ADLs: Overall ADLs: increased time for self-care tasks, about twice as long as before  Transfers/ambulation related to ADLs: Eating: increased spillage when eating and carrying cup, uses larger spoon Grooming: Mod I UB Dressing: Mod I LB Dressing: occasional difficulty with tying shoes, is now wearing [employer] slip ins, increased time with donning socks Toileting: Mod I Bathing: Mod I Tub  Shower transfers: Mod I Equipment: Shower seat with back and Walk in shower  IADLs: Light housekeeping: used to load and unload dishwasher but has not been feeling up to it Meal Prep: will occasionally make a sandwich Community mobility: not driving as pt would veer to right when driving Medication management: spouse fills pill box Handwriting: 75% legible, Mild micrographia, and PPT#1 handwriting ([place]  [place]): 17.87 seconds. Pt writing in all caps when writing in print.  Mild micrographia, but remained same size from start to finish.  MOBILITY STATUS: wide shuffling gait, he has noted some freezing - as if he has to tell his body to move and its doesn't do what he wants.  POSTURE COMMENTS:  Mild Right lean  ACTIVITY TOLERANCE: Activity tolerance: diminished  FUNCTIONAL OUTCOME MEASURES: Physical performance test: PPT#2 (simulated eating) 14.32 sec - 1 drop  COORDINATION: Finger Nose Finger test: tremors noted bilaterally in fingers and forearm, however not impacting motor control.  Slower overall bilaterally 9 Hole Peg test: Right: 30.81 sec; Left: 39.18 sec Box and Blocks:  Right 52 blocks, Left 50 blocks RAM: decreased quality of movement, requiring tactile cues of thighs  UE ROM:  WFL, B shoulder limited ~140-150*  UE MMT:   WFL  SENSATION: Reports occasional tingling in fingers on L hand  COGNITION: Overall cognitive status: Within functional limits for tasks assessed and pt reports memory is not as good as it was before, also difficulty with dual tasking  OBSERVATIONS: Bradykinesia                                                                                                                    TREATMENT DATE:  11/27/23 Large amplitude hands: OT reviewed large amplitude exercises from previous session.  Pt completing forearm supination/pronation, large hand opening, and finger flicks. Pt still with mild tremor L > R with hand movements. Coordination: w/ each UE including: flipping cards one at a time off a deck, picking up coins 1 at a time and placing into coin slot, stacking/unstacking coins, picking up 5-10 coins 1 at a time to hold in palm and then translate one at a time to place into container.  OT instructing pt in large amplitude coordination tasks with focus on big open hand prior to picking up and releasing items to challenge tendency of movements to  get smaller.   Pt with good carryover of opening hand intermittently throughout task. Self-feeding: engaged in simulated self-feeding with beans while OT providing demonstration and education on positioning of hand on to spoon handle, proximity towards bowl of spoon, intermittent grip strength, and various depths of bowls to increase ease and amplitude with scooping.  Pt demonstrating decreased tremors this session with self-feeding.    11/17/23 Large amplitude: OT instructed pt in large amplitude hand exercises (see pt instructions) providing pt with demonstration and cues for large amplitude.  Pt still with mild tremor L > R with hand movements. Function reach: engaged in reaching for cones in  cabinet with RUE and LUE with focus on large amplitude reaching, full grasp, and large hand opening when releasing cones.  OT educating on increased contact/surface area of hand on cone to improve motor control and keep task active to reduce tremors.  Pt demonstrating good carryover with improved motor control bilaterally. 9 hole ezh:Mphyu: 26.63 sec, Left: 31.72 sec.  Pt demonstrating improved coordination bilaterally.  OT reiterating use of large amplitude/PWR hands prior to completing fine motor control tasks to increase coordination/control.   10/19/23 Educated on large amplitude movements to aid in increased quality of movements with fine and gross motor tasks.      PATIENT EDUCATION: Education details: large amplitude hands for gross and fine motor control Person educated: Patient and Spouse Education method: Explanation, Demonstration, Verbal cues, and Handouts Education comprehension: verbalized understanding and needs further education  HOME EXERCISE PROGRAM: PWR hands (see pt instructions)  GOALS: Goals reviewed with patient? Yes  SHORT TERM GOALS: Target date: 11/10/23  Pt will be Independent with PD specific HEP Baseline: new to OPOT Goal status: in progress  2.  Pt will verbalize  understanding of task modifications, adapted strategies, and/or potential DME/AE needs to increase ease, safety, and independence w/ ADLs/IADLs. Baseline: not engaging in IADLs at previous level, and increased time for ADLs Goal status: in progress  3.  Pt will demonstrate improved fine motor coordination for ADLs as evidenced by decreasing 9 hole peg test score for RUE/LUE by 5 secs Baseline: 9 Hole Peg test: Right: 30.81 sec; Left: 39.18 sec 11/17/23: Right: 26.63 sec, Left: 31.72 sec Goal status: Partially met - Met on Left    LONG TERM GOALS: Target date: 12/15/23  Pt will demonstrate improved ease with feeding as evidenced by decreasing PPT#2 (self feeding) by 3 secs Baseline: 14.32 sec Goal status: in progress  2.  Pt will demonstrate improved UE functional use for ADLs as evidenced by increasing box/ blocks score by 5 blocks with RUE/LUE Baseline: R: 52 and L: 50 Goal status: in progress  3.  Pt will demonstrate understanding of memory compensations and ways to keep thinking skills sharp Baseline: impaired memory Goal status: in progress  4.  Pt will demonstrate ability to walk 20' while carrying cup without any spillage. Baseline: spilling cup of water with ambulation Goal status: in progress  ASSESSMENT:  CLINICAL IMPRESSION: Patient is a 63 y.o. male who was seen today for occupational therapy treatment session for parkinsonism with tremors.  Pt with resting tremors in BUE (L>R), however demonstrating good understanding of large amplitude exercises and carryover during structured tasks.  Pt with decreased impact of tremors on functional tasks with coins, cards, ball toss, and simulated self-feeding.  Pt will continue to benefit from skilled occupational therapy services to address strength and coordination,  balance, GM/FM control, cognition, safety awareness, introduction of compensatory strategies/AE prn, and implementation of an HEP to improve participation and safety during  ADLs, IADLs, and quality of life.    PERFORMANCE DEFICITS: in functional skills including ADLs, IADLs, coordination, dexterity, sensation, ROM, strength, Fine motor control, Gross motor control, balance, body mechanics, endurance, decreased knowledge of precautions, decreased knowledge of use of DME, and UE functional use, cognitive skills including memory and safety awareness, and psychosocial skills including coping strategies, environmental adaptation, and routines and behaviors.   IMPAIRMENTS: are limiting patient from ADLs, IADLs, and leisure.     PLAN:  OT FREQUENCY: 1-2x/week  OT DURATION: 6 weeks (asking 8 weeks for scheduling)  PLANNED INTERVENTIONS: 02831 OT  Re-evaluation, 97535 self care/ADL training, 02889 therapeutic exercise, 97530 therapeutic activity, 97112 neuromuscular re-education, 97750 Physical Performance Testing, balance training, functional mobility training, psychosocial skills training, energy conservation, coping strategies training, patient/family education, and DME and/or AE instructions  RECOMMENDED OTHER SERVICES: PT  CONSULTED AND AGREED WITH PLAN OF CARE: Patient and family member/caregiver  PLAN FOR NEXT SESSION: initiate PD HEP with focus on large amplitude movements - bag exercises, review coordination HEP, educate on memory/thinking skills, attempt dual tasking with seated progressing to standing and ambulating   Deamber Buckhalter, OTR/L 11/27/2023, 11:04 AM  [facility] at [facility] [address] [place], [place] [address] Phone # [phone] Fax # [phone]

## 2023-11-27 NOTE — Therapy (Signed)
 OUTPATIENT PHYSICAL THERAPY NEURO TREATMENT and Progress Note   Patient Name: Melvin Roberts MRN: [id number] DOB:27-Feb-1961, 63 y.o., male Today's Date: 11/27/2023   PCP: [name], MD REFERRING PROVIDER: [name], MD  Progress Note Reporting Period 10/19/23 to 11/27/23  See note below for Objective Data and Assessment of Progress/Goals.      END OF SESSION:  PT End of Session - 11/27/23 1034     Visit Number 10    Number of Visits 16    Date for PT Re-Evaluation 12/18/23    Authorization Type aetna; no VL    PT Start Time 1100    PT Stop Time 1145    PT Time Calculation (min) 45 min    Equipment Utilized During Treatment Gait belt    Activity Tolerance Patient tolerated treatment well    Behavior During Therapy WFL for tasks assessed/performed             Past Medical History:  Diagnosis Date   Allergy    Bipolar 1 disorder (HCC)    GERD (gastroesophageal reflux disease)    Hyperlipidemia    Hypertension    Past Surgical History:  Procedure Laterality Date   BACK SURGERY  11/17/1995   Dr. Duwayne   COLONOSCOPY     EYE SURGERY     HERNIA REPAIR  04/19/2007   umbilical   UPPER GASTROINTESTINAL ENDOSCOPY  x2   w/ esophageal dilation   Patient Active Problem List   Diagnosis Date Noted   Tremor of both hands 04/20/2023   History of bipolar disorder 04/20/2023   Short-term memory loss 04/20/2023   Cognitive attention deficit 04/20/2023   Ataxia involving legs 04/20/2023   Upper respiratory tract infection 08/17/2020   Vitamin D  deficiency 04/15/2019   BMI 32.0-32.9,adult 04/15/2019   Hypertension 09/16/2013   Bipolar disorder (HCC) 03/11/2013   Allergic rhinitis 03/11/2013   BPH (benign prostatic hyperplasia) 03/11/2013   Hyperlipidemia 05/29/2007   Depression, recurrent (HCC) 05/29/2007   Sinusitis, chronic 05/29/2007   ESOPHAGEAL STRICTURE 05/29/2007   GERD 05/29/2007   HIATAL HERNIA 05/29/2007   LOW BACK PAIN, CHRONIC 05/29/2007     ONSET DATE: 10/02/23  REFERRING DIAG:  R25.1 (ICD-10-CM) - Tremor of both hands  R41.3 (ICD-10-CM) - Short-term memory loss  Z86.59 (ICD-10-CM) - History of bipolar disorder  F33.9 (ICD-10-CM) - Depression, recurrent (HCC    THERAPY DIAG:  Other symptoms and signs involving the nervous system  Unsteadiness on feet  Other abnormalities of gait and mobility  Abnormal posture  Rationale for Evaluation and Treatment: Rehabilitation  SUBJECTIVE:  SUBJECTIVE STATEMENT: Doing well, legs are a little stiff. Got the ramp installed and works well  Pt accompanied by: self (spouse: [name])  PERTINENT HISTORY: parkinsonism, bipolar disorder, HTN, hyperlipidemia, knee surgeries 08/2022  PAIN:  Are you having pain? Yes: NPRS scale: no Pain location: low back, knees Pain description: varies Aggravating factors: weather Relieving factors: varies  PRECAUTIONS: Fall  WEIGHT BEARING RESTRICTIONS: No  FALLS: Has patient fallen in last 6 months? Yes. Number of falls 2, in the last month. He was falling daily until getting the rollator RW in the past few months.  LIVING ENVIRONMENT: Lives with: lives with their spouse Lives in: House/apartment Stairs: Yes: External: 5 steps; can uses a rail-- he doesn't get his foot on the step well and going up he catches his foot Has following equipment at home: Walker - 4 wheeled  PLOF: Independent , declining in past year  PATIENT GOALS: walk more steady (without device would be nice)  OBJECTIVE:    TODAY'S TREATMENT: 11/27/23 Activity Comments  Standing UBE x 5 min 1 min warm-up 30 sec fast; 30 sec recovery For improved transverse pelvic rotation for gait  Berg Balance Test 50/56  10 meter walk test: 2.11 ft/sec w/ rollator   Floor to stand  independent  treadmill Trials of increased speed and visual cues for stride length/foot clearance--incr festinating w/ speed        TODAY'S TREATMENT: 11/20/23 Activity Comments  nustep HIIT for neural priming 2 min warm up L1 30 sec L6 fast 30 sec L5 fast 1 min L2 1 min L6 30 sec L4 1 min L2 30 sec fast L6 30 sec L4 1 min cooldown L1 Cueing for long strides throughout  fwd step to target + B UE reach  Performed without UEs first. Required cueing for fwd wt shift onto stepping LE  backward step to target + B UE push Performed without UEs first. Required cueing for back wt shift onto stepping LE. More difficulty wt shifting and stepping onto the R LE   gait training with TB across 4WW as visual cue [id number] Excellent improvement in step length and consistency of steps. Requires additional cueing to maintain continuity and size of steps with turns. Tendency to look down at feet throughout when walking   STS + immediate fwd step 10x  Discourage several small corrective steps and instead promoted 2 large steps to complete activity   figure 8 turns with 4WW Focus for large and continuous steps, tall posture      HOME EXERCISE PROGRAM Last updated: 10/23/23 Sitting PWR moves 2x10 each/day     Note: Objective measures were completed at Evaluation unless otherwise noted. DIAGNOSTIC FINDINGS: DAT scan, NPH assessment  COGNITION: Overall cognitive status: Impaired; he started aricept , also notes hearing declined significantly in last year   SENSATION: L hand  COORDINATION: RAM mildly ataxic, symmetrical with FTN and opposition  MUSCLE LENGTH: Hamstrings: tightness noted bilaterally  POSTURE: No Significant postural limitations  LOWER EXTREMITY ROM:    WFLs  LOWER EXTREMITY MMT:   MMT Right Eval Left Eval  Hip flexion 5/5 5/5  Hip extension    Hip abduction    Hip adduction    Hip internal rotation    Hip external rotation    Knee flexion 5/5 5/5  Knee extension 5/5  5/5  Ankle dorsiflexion 4-/5 5/5  Ankle plantarflexion    Ankle inversion    Ankle eversion    (Blank rows = not tested)  BED MOBILITY:  Findings: Sit to supine Complete Independence  TRANSFERS: Sit to stand: Complete Independence  Assistive device utilized: to rollator RW      STAIRS: TBA-- has 5 at home  GAIT: Findings: Gait Characteristics: decreased step length- Right, decreased step length- Left, decreased stride length, decreased ankle dorsiflexion- Right, decreased ankle dorsiflexion- Left, shuffling, poor foot clearance- Right, and poor foot clearance- Left, Distance walked: 100 ft, Assistive device utilized:Walker - 4 wheeled and None, Level of assistance: SBA, and Comments: Patient ambulates with frequent festinating patterns and reports at times freezing for 1 minute in the home Gait speed=1.28 ft/sec without device Gait speed=1.76 ft/sec with rollator  FUNCTIONAL TESTS:  5 times sit to stand: 22.92 Timed up and go (TUG): 25.22, TUG cognitive is 33.60 seconds with diminished accuracy in counting Berg Balance Scale:  Item Test date: 10/19/23 Test date: 11/27/23 Test date:   Sitting to standing 4. able to stand without using hands and stabilize independently 4. able to stand without using hands and stabilize independently  4. able to stand safely for 2 minutes  4. able to sit safely and securely for 2 minutes   4. sits safely with minimal use of hands  4. able to transfer safely with minor use of hands  4. able to stand 10 seconds safely  4. able to place feet together independently and stand 1 minute safely  4. can reach forward confidently 25 cm (10 inches)    4. able to pick up slipper safely and easily  3. looks behind one side only, other side shows less weight shift    2. able to turn 360 degrees safely but slowly  4. ble to stand independently and safely and complete 8 steps in 20 seconds    3. able to place foot ahead independently and hold 30 seconds   2.  able to lift leg independently and hold >= 3 seconds     Insert OPRCBERGREEVAL SmartPhrase at re-test date  2. Standing unsupported 4. able to stand safely for 2 minutes    3. Sitting with back unsupported, feet supported 4. able to sit safely and securely for 2 minutes    4. Standing to sitting 4. sits safely with minimal use of hands    5. Pivot transfer  4. able to transfer safely with minor use of hands    6. Standing unsupported with eyes closed 4. able to stand 10 seconds safely    7. Standing unsupported with feet together 3. able to place feet together independently and stand 1 minute with supervision    8. Reaching forward with outstretched arms while standing 2. can reach forward 5 cm (2 inches)    9. Pick up object from the floor from standing 3. able to pick up slipper but needs supervision    10. Turning to look behind over left and right shoulders while standing 2. turns sideways but only maintains balance    11. Turn 360 degrees 1. needs close supervision or verbal cuing    12. Place alternate foot on step or stool while standing unsupported 0. needs assistance to keep from falling/unable to try    13. Standing unsupported one foot in front 2. able to take small step independently and hold 30 seconds    14. Standing on one leg 1. tries to lift leg unable to hold 3 seconds but remains standing independently.      Total Score 38/56 Total Score x 50/56 Total Score x/56   Turning= 16 steps  to the R and 22 steps to the L  PATIENT SURVEYS:  Freezing of gait questionnaire Not performed                                                                                                                              [facility] Adult PT Treatment:                                                DATE: 10/19/23 Gait: Discussed tips to reduce freezing and practiced in session. Tips to reduce freezing episodes with standing or walking:  Stand tall with your feet wide, so that you can rock and weight  shift through your hips. Don't try to fight the freeze: if you begin taking slower, faster, smaller steps, STOP, get your posture tall, and RESET your posture and balance.  Take a deep breath before taking the BIG step to start again. March in place, with high knee stepping, to get started walking again. Use auditory cues:  Count out loud, think of a familiar tune or song or cadence, use pocket metronome, to use rhythm to get started walking again. Use visual cues:  Use a line to step over, use laser pointer line to step over, (using BIG steps) to start walking again. Use visual targets to keep your posture tall (look ahead and focus on an object or target at eye level). As you approach where your destination with walking, count your steps out loud and/or focus on your target with your eyes until you are fully there. Use appropriate assistive device, as advised by your physical therapist to assist with taking longer, consistent steps.   PATIENT EDUCATION: Education details: Nature of PT, goals for therapy, tips to reduce freezing. Person educated: Patient and Spouse Education method: Explanation, Demonstration, and Handouts Education comprehension: verbalized understanding and returned demonstration  HOME EXERCISE PROGRAM: None established-- provided tips for freezing  GOALS: Goals reviewed with patient? Yes  SHORT TERM GOALS: Target date: 11/18/23  The patient will perform HEP with wife's assist. Baseline: initiated at eval Goal status: MET  2.  The patient will improve gait speed to 2.5 ft/sec with rollater demonstrating dec'd festination. Baseline:  1.76 ft/sec; 2.11 ft/sec Goal status: NOT MET 11/27/23  3.   The patient will improve Berg to > or equal to 42/56.  Baseline:  38/56; 50/56 Goal status: MET  4.  The patient will move floor<>stand with UE support due to h/o falls. Baseline:  PT to further assess; independent Goal status: MET  LONG TERM GOALS: Target date:  12/19/23  The patient will have progression of HEP with wife's assist Baseline:  initiated at eval Goal status: IN PROGRESS  2.  The patient will improve freezing of gait by 5 points to demonstrate improved functional abilities. Baseline: 17/24 10/23/23 Goal status:  IN PROGRESS  3.  The patient will improve Berg to > or equal to 46/56. Baseline:  38/56; 50/56 Goal status: MET  4.  The patient will improve TUG score < or equal to 20 seconds. Baseline:  25.22 seconds Goal status: IN PROGRESS  5.  The patient will improve 5 time sit<>stand to < 18 seconds. Baseline: 22.92 seconds Goal status: IN PROGRESS  6.  The patient will report reduction in falls to < or equal to 2x/month. Baseline:  varies-- has been as much as daily to 2x/month this year Goal status: IN PROGRESS  ASSESSMENT:  CLINICAL IMPRESSION: Progress note details completed w/ independence for initial HEP and freezing of gait strategies. Berg Balance Test w/ improved score from initial 38 to 50/56 indicating low risk for falls. Gait speed improved from initial 1.7 ft/sec to 2.1 ft/sec with improved abilities utilizing visual cues. Pre-gait activities to improve hip rotation for stride length and trials of treadmill training to facilitate increased gait speed, but this provoked increased festinating despite visual cues. Continued sessions to advance POC details to improve mobility and further reduce fall risk  OBJECTIVE IMPAIRMENTS: Abnormal gait, decreased balance, decreased coordination, decreased knowledge of use of DME, decreased mobility, difficulty walking, increased fascial restrictions, impaired flexibility, postural dysfunction, and pain.   ACTIVITY LIMITATIONS: carrying, lifting, bending, standing, transfers, bed mobility, and locomotion level  PARTICIPATION LIMITATIONS: cleaning, personal finances, driving, community activity, and occupation  PERSONAL FACTORS: 3+ comorbidities: see above are also affecting patient's  functional outcome.   REHAB POTENTIAL: Good  CLINICAL DECISION MAKING: Unstable/unpredictable  EVALUATION COMPLEXITY: High  PLAN:  PT FREQUENCY: 2x/week  PT DURATION: 8 weeks  PLANNED INTERVENTIONS: 97164- PT Re-evaluation, 97750- Physical Performance Testing, 97110-Therapeutic exercises, 97530- Therapeutic activity, [id number]- Neuromuscular re-education, 97535- Self Care, [id number]- Manual therapy, [phone]- Gait training, Patient/Family education, Balance training, Stair training, and DME instructions  PLAN FOR NEXT SESSION: PWR! Exercises working initially on turns and reducing freezing during gait.  *adjust height of walker   12:45 PM, 11/27/23 M. Kelly London [place], PT, DPT Physical Therapist- [place] Office Number: [phone]

## 2023-11-27 NOTE — Patient Instructions (Signed)
 Coordination Exercises  Perform the following exercises for 15 minutes 1 times per day. Perform with both hand(s). Perform using big movements.  Flipping Cards: Place deck of cards on the table. Flip cards over by opening your hand big to grasp and then turn your palm up big. Deal cards: Hold 1/2 or whole deck in your hand. Use thumb to push card off top of deck with one big push. Flip card between each finger.  Toss ball from one hand to the other: Toss big/high. Toss ball in the air and catch with the same hand: Toss big/high.  Pick up coins and place in coin bank or container: Pick up with big, intentional movements. Do not drag coin to the edge. Pick up coins and stack one at a time: Pick up with big, intentional movements. Do not drag coin to the edge. (5-10 in a stack) Pick up 5-10 coins one at a time and hold in palm. Then, move coins from palm to fingertips one at time and place in coin bank/container.

## 2023-11-29 ENCOUNTER — Ambulatory Visit

## 2023-11-29 ENCOUNTER — Ambulatory Visit: Admitting: Occupational Therapy

## 2023-11-29 DIAGNOSIS — R278 Other lack of coordination: Secondary | ICD-10-CM

## 2023-11-29 DIAGNOSIS — R2689 Other abnormalities of gait and mobility: Secondary | ICD-10-CM

## 2023-11-29 DIAGNOSIS — R2681 Unsteadiness on feet: Secondary | ICD-10-CM

## 2023-11-29 DIAGNOSIS — R29818 Other symptoms and signs involving the nervous system: Secondary | ICD-10-CM

## 2023-11-29 DIAGNOSIS — R293 Abnormal posture: Secondary | ICD-10-CM

## 2023-11-29 NOTE — Therapy (Signed)
 OUTPATIENT PHYSICAL THERAPY NEURO TREATMENT  Patient Name: Melvin Roberts MRN: [id number] DOB:December 04, 1960, 63 y.o., male Today's Date: 11/29/2023   PCP: [name], MD REFERRING PROVIDER: [name], MD   END OF SESSION:  PT End of Session - 11/29/23 1102     Visit Number 11    Number of Visits 16    Date for PT Re-Evaluation 12/18/23    Authorization Type aetna; no VL    PT Start Time 1100    PT Stop Time 1145    PT Time Calculation (min) 45 min    Equipment Utilized During Treatment Gait belt    Activity Tolerance Patient tolerated treatment well    Behavior During Therapy WFL for tasks assessed/performed             Past Medical History:  Diagnosis Date   Allergy    Bipolar 1 disorder (HCC)    GERD (gastroesophageal reflux disease)    Hyperlipidemia    Hypertension    Past Surgical History:  Procedure Laterality Date   BACK SURGERY  11/17/1995   Dr. Duwayne   COLONOSCOPY     EYE SURGERY     HERNIA REPAIR  04/19/2007   umbilical   UPPER GASTROINTESTINAL ENDOSCOPY  x2   w/ esophageal dilation   Patient Active Problem List   Diagnosis Date Noted   Tremor of both hands 04/20/2023   History of bipolar disorder 04/20/2023   Short-term memory loss 04/20/2023   Cognitive attention deficit 04/20/2023   Ataxia involving legs 04/20/2023   Upper respiratory tract infection 08/17/2020   Vitamin D  deficiency 04/15/2019   BMI 32.0-32.9,adult 04/15/2019   Hypertension 09/16/2013   Bipolar disorder (HCC) 03/11/2013   Allergic rhinitis 03/11/2013   BPH (benign prostatic hyperplasia) 03/11/2013   Hyperlipidemia 05/29/2007   Depression, recurrent (HCC) 05/29/2007   Sinusitis, chronic 05/29/2007   ESOPHAGEAL STRICTURE 05/29/2007   GERD 05/29/2007   HIATAL HERNIA 05/29/2007   LOW BACK PAIN, CHRONIC 05/29/2007    ONSET DATE: 10/02/23  REFERRING DIAG:  R25.1 (ICD-10-CM) - Tremor of both hands  R41.3 (ICD-10-CM) - Short-term memory loss  Z86.59  (ICD-10-CM) - History of bipolar disorder  F33.9 (ICD-10-CM) - Depression, recurrent (HCC    THERAPY DIAG:  Unsteadiness on feet  Other abnormalities of gait and mobility  Abnormal posture  Other symptoms and signs involving the nervous system  Rationale for Evaluation and Treatment: Rehabilitation  SUBJECTIVE:  SUBJECTIVE STATEMENT: Doing ok. [place] 1[place]2x[place]wk  Pt accompanied by: self (spouse: Andrea)  PERTINENT HISTORY: parkinsonism, bipolar disorder, HTN, hyperlipidemia, knee surgeries 08/2022  PAIN:  Are you having pain? Yes: NPRS scale: no Pain location: low back, knees Pain description: varies Aggravating factors: weather Relieving factors: varies  PRECAUTIONS: Fall  WEIGHT BEARING RESTRICTIONS: No  FALLS: Has patient fallen in last 6 months? Yes. Number of falls 2, in the last month. He was falling daily until getting the rollator RW in the past few months.  LIVING ENVIRONMENT: Lives with: lives with their spouse Lives in: House/apartment Stairs: Yes: External: 5 steps; can uses a rail-- he doesn't get his foot on the step well and going up he catches his foot Has following equipment at home: Walker - 4 wheeled  PLOF: Independent , declining in past year  PATIENT GOALS: walk more steady (without device would be nice)  OBJECTIVE:   TODAY'S TREATMENT: 11/29/23 Activity Comments  Forwards/backwards  High step 6 hurdles // bars x 2 min  Gait w/ rollator -trials for increased step height w/ slight carryover -1.5# cuff weights on ankles--modest improvement in step height -large amplitude hip swing x 60 sec 1.5# -high step march knees to hands x 60 sec -gait w/ rollator 2x85 ft: improved step height on straightaway, shuffling in turns -forced pace cycling:  pt on recumbent, therapist assist to sustain 80-100 RPM -gait w/ rollator: no shuffling in turns after cycling                 TODAY'S TREATMENT: 11/27/23 Activity Comments  Standing UBE x 5 min 1 min warm-up 30 sec fast; 30 sec recovery For improved transverse pelvic rotation for gait  Berg Balance Test 50/56  10 meter walk test: 2.11 ft/sec w/ rollator   Floor to stand independent  treadmill Trials of increased speed and visual cues for stride length/foot clearance--incr festinating w/ speed        TODAY'S TREATMENT: 11/20/23 Activity Comments  nustep HIIT for neural priming 2 min warm up L1 30 sec L6 fast 30 sec L5 fast 1 min L2 1 min L6 30 sec L4 1 min L2 30 sec fast L6 30 sec L4 1 min cooldown L1 Cueing for long strides throughout  fwd step to target + B UE reach  Performed without UEs first. Required cueing for fwd wt shift onto stepping LE  backward step to target + B UE push Performed without UEs first. Required cueing for back wt shift onto stepping LE. More difficulty wt shifting and stepping onto the R LE   gait training with TB across 4WW as visual cue x285ft Excellent improvement in step length and consistency of steps. Requires additional cueing to maintain continuity and size of steps with turns. Tendency to look down at feet throughout when walking   STS + immediate fwd step 10x  Discourage several small corrective steps and instead promoted 2 large steps to complete activity   figure 8 turns with 4WW Focus for large and continuous steps, tall posture      HOME EXERCISE PROGRAM Last updated: 10/23/23 Sitting PWR moves 2x10 each/day     Note: Objective measures were completed at Evaluation unless otherwise noted. DIAGNOSTIC FINDINGS: DAT scan, NPH assessment  COGNITION: Overall cognitive status: Impaired; he started aricept , also notes hearing declined significantly in last year   SENSATION: L hand  COORDINATION: RAM mildly ataxic, symmetrical  with FTN and opposition  MUSCLE LENGTH: Hamstrings: tightness noted bilaterally  POSTURE: No Significant postural limitations  LOWER EXTREMITY ROM:    WFLs  LOWER EXTREMITY MMT:   MMT Right Eval Left Eval  Hip flexion 5/5 5/5  Hip extension    Hip abduction    Hip adduction    Hip internal rotation    Hip external rotation    Knee flexion 5/5 5/5  Knee extension 5/5 5/5  Ankle dorsiflexion 4-/5 5/5  Ankle plantarflexion    Ankle inversion    Ankle eversion    (Blank rows = not tested)  BED MOBILITY:  Findings: Sit to supine Complete Independence  TRANSFERS: Sit to stand: Complete Independence  Assistive device utilized: to rollator RW      STAIRS: TBA-- has 5 at home  GAIT: Findings: Gait Characteristics: decreased step length- Right, decreased step length- Left, decreased stride length, decreased ankle dorsiflexion- Right, decreased ankle dorsiflexion- Left, shuffling, poor foot clearance- Right, and poor foot clearance- Left, Distance walked: 100 ft, Assistive device utilized:Walker - 4 wheeled and None, Level of assistance: SBA, and Comments: Patient ambulates with frequent festinating patterns and reports at times freezing for 1 minute in the home Gait speed=1.28 ft/sec without device Gait speed=1.76 ft/sec with rollator  FUNCTIONAL TESTS:  5 times sit to stand: 22.92 Timed up and go (TUG): 25.22, TUG cognitive is 33.60 seconds with diminished accuracy in counting Berg Balance Scale:  Item Test date: 10/19/23 Test date: 11/27/23 Test date:   Sitting to standing 4. able to stand without using hands and stabilize independently 4. able to stand without using hands and stabilize independently  4. able to stand safely for 2 minutes  4. able to sit safely and securely for 2 minutes   4. sits safely with minimal use of hands  4. able to transfer safely with minor use of hands  4. able to stand 10 seconds safely  4. able to place feet together independently and stand 1  minute safely  4. can reach forward confidently 25 cm (10 inches)    4. able to pick up slipper safely and easily  3. looks behind one side only, other side shows less weight shift    2. able to turn 360 degrees safely but slowly  4. ble to stand independently and safely and complete 8 steps in 20 seconds    3. able to place foot ahead independently and hold 30 seconds   2. able to lift leg independently and hold >= 3 seconds     Insert OPRCBERGREEVAL SmartPhrase at re-test date  2. Standing unsupported 4. able to stand safely for 2 minutes    3. Sitting with back unsupported, feet supported 4. able to sit safely and securely for 2 minutes    4. Standing to sitting 4. sits safely with minimal use of hands    5. Pivot transfer  4. able to transfer safely with minor use of hands    6. Standing unsupported with eyes closed 4. able to stand 10 seconds safely    7. Standing unsupported with feet together 3. able to place feet together independently and stand 1 minute with supervision    8. Reaching forward with outstretched arms while standing 2. can reach forward 5 cm (2 inches)    9. Pick up object from the floor from standing 3. able to pick up slipper but needs supervision    10. Turning to look behind over left and right shoulders while standing 2. turns sideways but only maintains balance    11.  Turn 360 degrees 1. needs close supervision or verbal cuing    12. Place alternate foot on step or stool while standing unsupported 0. needs assistance to keep from falling/unable to try    13. Standing unsupported one foot in front 2. able to take small step independently and hold 30 seconds    14. Standing on one leg 1. tries to lift leg unable to hold 3 seconds but remains standing independently.      Total Score 38/56 Total Score x 50/56 Total Score x/56   Turning= 16 steps to the R and 22 steps to the L  PATIENT SURVEYS:  Freezing of gait questionnaire Not performed                                                                                                                               [facility] Adult PT Treatment:                                                DATE: 10/19/23 Gait: Discussed tips to reduce freezing and practiced in session. Tips to reduce freezing episodes with standing or walking:  Stand tall with your feet wide, so that you can rock and weight shift through your hips. Don't try to fight the freeze: if you begin taking slower, faster, smaller steps, STOP, get your posture tall, and RESET your posture and balance.  Take a deep breath before taking the BIG step to start again. March in place, with high knee stepping, to get started walking again. Use auditory cues:  Count out loud, think of a familiar tune or song or cadence, use pocket metronome, to use rhythm to get started walking again. Use visual cues:  Use a line to step over, use laser pointer line to step over, (using BIG steps) to start walking again. Use visual targets to keep your posture tall (look ahead and focus on an object or target at eye level). As you approach where your destination with walking, count your steps out loud and/or focus on your target with your eyes until you are fully there. Use appropriate assistive device, as advised by your physical therapist to assist with taking longer, consistent steps.   PATIENT EDUCATION: Education details: Nature of PT, goals for therapy, tips to reduce freezing. Person educated: Patient and Spouse Education method: Explanation, Demonstration, and Handouts Education comprehension: verbalized understanding and returned demonstration  HOME EXERCISE PROGRAM: None established-- provided tips for freezing  GOALS: Goals reviewed with patient? Yes  SHORT TERM GOALS: Target date: 11/18/23  The patient will perform HEP with wife's assist. Baseline: initiated at eval Goal status: MET  2.  The patient will improve gait speed to 2.5 ft/sec with rollater  demonstrating dec'd festination. Baseline:  1.76 ft/sec; 2.11 ft/sec Goal status: NOT MET 11/27/23  3.  The patient will improve Berg to > or equal to 42/56.  Baseline:  38/56; 50/56 Goal status: MET  4.  The patient will move floor<>stand with UE support due to h/o falls. Baseline:  PT to further assess; independent Goal status: MET  LONG TERM GOALS: Target date: 12/19/23  The patient will have progression of HEP with wife's assist Baseline:  initiated at eval Goal status: IN PROGRESS  2.  The patient will improve freezing of gait by 5 points to demonstrate improved functional abilities. Baseline: 17/24 10/23/23 Goal status: IN PROGRESS  3.  The patient will improve Berg to > or equal to 46/56. Baseline:  38/56; 50/56 Goal status: MET  4.  The patient will improve TUG score < or equal to 20 seconds. Baseline:  25.22 seconds Goal status: IN PROGRESS  5.  The patient will improve 5 time sit<>stand to < 18 seconds. Baseline: 22.92 seconds Goal status: IN PROGRESS  6.  The patient will report reduction in falls to < or equal to 2x/month. Baseline:  varies-- has been as much as daily to 2x/month this year Goal status: IN PROGRESS  ASSESSMENT:  CLINICAL IMPRESSION: Focus on gait training for improved step height/foot clearance and stride length.  Good carryover to step height with visual cues but minimal carryover to regular walking. Greatest impact after forced pace fast cycling where thereafter he was able to sustain foot clearance and maintain through turns.  Discussed Pedaling for Parkinson's program and its derived benefits and provided info for local class.   OBJECTIVE IMPAIRMENTS: Abnormal gait, decreased balance, decreased coordination, decreased knowledge of use of DME, decreased mobility, difficulty walking, increased fascial restrictions, impaired flexibility, postural dysfunction, and pain.   ACTIVITY LIMITATIONS: carrying, lifting, bending, standing, transfers, bed  mobility, and locomotion level  PARTICIPATION LIMITATIONS: cleaning, personal finances, driving, community activity, and occupation  PERSONAL FACTORS: 3+ comorbidities: see above are also affecting patient's functional outcome.   REHAB POTENTIAL: Good  CLINICAL DECISION MAKING: Unstable/unpredictable  EVALUATION COMPLEXITY: High  PLAN:  PT FREQUENCY: 2x/week  PT DURATION: 8 weeks  PLANNED INTERVENTIONS: 97164- PT Re-evaluation, 97750- Physical Performance Testing, 97110-Therapeutic exercises, 97530- Therapeutic activity, [id number]- Neuromuscular re-education, 97535- Self Care, [id number]- Manual therapy, [phone]- Gait training, Patient/Family education, Balance training, Stair training, and DME instructions  PLAN FOR NEXT SESSION: PWR! Exercises working initially on turns and reducing freezing during gait.  *adjust height of walker   11:02 AM, 11/29/23 M. Kelly Nitara Szczerba, PT, DPT Physical Therapist- [place] Office Number: [phone]

## 2023-11-29 NOTE — Therapy (Signed)
 OUT[facility]  Treatment Note  Patient Name: Melvin Roberts MRN: [id number] DOB:03-29-1961, 63 y.o., male Today's Date: 11/29/2023  PCP: Zollie Lowers, MD REFERRING PROVIDER: Dohmeier, Dedra, MD  END OF SESSION:  OT End of Session - 11/29/23 1148     Visit Number 4    Number of Visits 13    Date for OT Re-Evaluation 12/15/23    Authorization Type [employer] [id number]    OT Start Time 1030    OT Stop Time 1100    OT Time Calculation (min) 30 min    Activity Tolerance Patient tolerated treatment well    Behavior During Therapy WFL for tasks assessed/performed             Past Medical History:  Diagnosis Date   Allergy    Bipolar 1 disorder (HCC)    GERD (gastroesophageal reflux disease)    Hyperlipidemia    Hypertension    Past Surgical History:  Procedure Laterality Date   BACK SURGERY  11/17/1995   Dr. Duwayne   COLONOSCOPY     EYE SURGERY     HERNIA REPAIR  04/19/2007   umbilical   UPPER GASTROINTESTINAL ENDOSCOPY  x2   w/ esophageal dilation   Patient Active Problem List   Diagnosis Date Noted   Tremor of both hands 04/20/2023   History of bipolar disorder 04/20/2023   Short-term memory loss 04/20/2023   Cognitive attention deficit 04/20/2023   Ataxia involving legs 04/20/2023   Upper respiratory tract infection 08/17/2020   Vitamin D  deficiency 04/15/2019   BMI 32.0-32.9,adult 04/15/2019   Hypertension 09/16/2013   Bipolar disorder (HCC) 03/11/2013   Allergic rhinitis 03/11/2013   BPH (benign prostatic hyperplasia) 03/11/2013   Hyperlipidemia 05/29/2007   Depression, recurrent (HCC) 05/29/2007   Sinusitis, chronic 05/29/2007   ESOPHAGEAL STRICTURE 05/29/2007   GERD 05/29/2007   HIATAL HERNIA 05/29/2007   LOW BACK PAIN, CHRONIC 05/29/2007    ONSET DATE: referral date 10/02/23  REFERRING DIAG: R25.1 (ICD-10-CM) - Tremor of both hands R41.3 (ICD-10-CM) - Short-term memory loss Z86.59 (ICD-10-CM) - History of bipolar  disorder F33.9 (ICD-10-CM) - Depression, recurrent  THERAPY DIAG:  Other symptoms and signs involving the nervous system  Other abnormalities of gait and mobility  Other lack of coordination  Rationale for Evaluation and Treatment: Rehabilitation  SUBJECTIVE:   SUBJECTIVE STATEMENT: Pt states he was at dentist this morning and got delayed at that appointment - that is why he is running 15 mins late this date.   Pt accompanied by: self and significant other (spouse [name], dropped off)  PERTINENT HISTORY: parkinsonism, bipolar disorder, HTN, hyperlipidemia  PRECAUTIONS: Fall  WEIGHT BEARING RESTRICTIONS: No  PAIN:  Are you having pain? No  FALLS: Has patient fallen in last 6 months? Yes. Number of falls 6-8, but less since using Rollator end of May  LIVING ENVIRONMENT: Lives with: lives with their spouse Lives in: Mobile home Stairs: Yes: External: 4-5 steps; can reach both Has following equipment at home: Single point cane, Walker - 4 wheeled, shower chair, and bed assist rail  PLOF: Independent and Independent with basic ADLs  PATIENT GOALS: better balance to walk better  OBJECTIVE:  Note: Objective measures were completed at Evaluation unless otherwise noted.  HAND DOMINANCE: Right  ADLs: Overall ADLs: increased time for self-care tasks, about twice as long as before  Transfers/ambulation related to ADLs: Eating: increased spillage when eating and carrying cup, uses larger spoon Grooming: Mod I UB Dressing: Mod I LB Dressing:  occasional difficulty with tying shoes, is now wearing [employer] slip ins, increased time with donning socks Toileting: Mod I Bathing: Mod I Tub Shower transfers: Mod I Equipment: Shower seat with back and Walk in shower  IADLs: Light housekeeping: used to load and unload dishwasher but has not been feeling up to it Meal Prep: will occasionally make a sandwich Community mobility: not driving as pt would veer to right when  driving Medication management: spouse fills pill box Handwriting: 75% legible, Mild micrographia, and PPT#1 handwriting (whales live in a blue ocean): 17.87 seconds. Pt writing in all caps when writing in print.  Mild micrographia, but remained same size from start to finish.  MOBILITY STATUS: wide shuffling gait, he has noted some freezing - as if he has to tell his body to move and its doesn't do what he wants.  POSTURE COMMENTS:  Mild Right lean  ACTIVITY TOLERANCE: Activity tolerance: diminished  FUNCTIONAL OUTCOME MEASURES: Physical performance test: PPT#2 (simulated eating) 14.32 sec - 1 drop  COORDINATION: Finger Nose Finger test: tremors noted bilaterally in fingers and forearm, however not impacting motor control.  Slower overall bilaterally 9 Hole Peg test: Right: 30.81 sec; Left: 39.18 sec Box and Blocks:  Right 52 blocks, Left 50 blocks RAM: decreased quality of movement, requiring tactile cues of thighs  UE ROM:  WFL, B shoulder limited ~140-150*  UE MMT:   WFL  SENSATION: Reports occasional tingling in fingers on L hand  COGNITION: Overall cognitive status: Within functional limits for tasks assessed and pt reports memory is not as good as it was before, also difficulty with dual tasking  OBSERVATIONS: Bradykinesia                                                                                                                    TREATMENT DATE:  11/29/23 Note pt 15 mins late to session, aware that only had 30 minutes for session. Pt had dentist appointment that ran over.  Focused initially on reaching task with 4# wrist weights at cabinet level to simulate home kitchen/IADL environment retrieving 1010 items, also functional mobility throughout gym maneuvering obstacles and reaching for cones as well retrieving 6/7 (missing 1) with wrist weights for additional proprioceptive input.  FMC: with 4# wrist weights, pt making pattern of 4 colors for clips along container  for reaching, pinching, and placing with pt noted to be easily distracted and needing cues to redirect or stopping task to focus on one thing at a time.  Pt ed and demonstration on techniques for self feeding for grounding and providing more stabilization to reduce tremors when self feeding.   11/27/23 Large amplitude hands: OT reviewed large amplitude exercises from previous session.  Pt completing forearm supination/pronation, large hand opening, and finger flicks. Pt still with mild tremor L > R with hand movements. Coordination: w/ each UE including: flipping cards one at a time off a deck, picking up coins 1 at a time and placing into coin slot, stacking/unstacking coins, picking up  5-10 coins 1 at a time to hold in palm and then translate one at a time to place into container.  OT instructing pt in large amplitude coordination tasks with focus on big open hand prior to picking up and releasing items to challenge tendency of movements to get smaller.   Pt with good carryover of opening hand intermittently throughout task. Self-feeding: engaged in simulated self-feeding with beans while OT providing demonstration and education on positioning of hand on to spoon handle, proximity towards bowl of spoon, intermittent grip strength, and various depths of bowls to increase ease and amplitude with scooping.  Pt demonstrating decreased tremors this session with self-feeding.    11/17/23 Large amplitude: OT instructed pt in large amplitude hand exercises (see pt instructions) providing pt with demonstration and cues for large amplitude.  Pt still with mild tremor L > R with hand movements. Function reach: engaged in reaching for cones in cabinet with RUE and LUE with focus on large amplitude reaching, full grasp, and large hand opening when releasing cones.  OT educating on increased contact/surface area of hand on cone to improve motor control and keep task active to reduce tremors.  Pt demonstrating good  carryover with improved motor control bilaterally. 9 hole ezh:Mphyu: 26.63 sec, Left: 31.72 sec.  Pt demonstrating improved coordination bilaterally.  OT reiterating use of large amplitude/PWR hands prior to completing fine motor control tasks to increase coordination/control.   10/19/23 Educated on large amplitude movements to aid in increased quality of movements with fine and gross motor tasks.      PATIENT EDUCATION: Education details: large amplitude hands for gross and fine motor control Person educated: Patient and Spouse Education method: Explanation, Demonstration, Verbal cues, and Handouts Education comprehension: verbalized understanding and needs further education  HOME EXERCISE PROGRAM: PWR hands (see pt instructions)  GOALS: Goals reviewed with patient? Yes  SHORT TERM GOALS: Target date: 11/10/23  Pt will be Independent with PD specific HEP Baseline: new to OPOT Goal status: in progress  2.  Pt will verbalize understanding of task modifications, adapted strategies, and/or potential DME/AE needs to increase ease, safety, and independence w/ ADLs/IADLs. Baseline: not engaging in IADLs at previous level, and increased time for ADLs Goal status: in progress  3.  Pt will demonstrate improved fine motor coordination for ADLs as evidenced by decreasing 9 hole peg test score for RUE/LUE by 5 secs Baseline: 9 Hole Peg test: Right: 30.81 sec; Left: 39.18 sec 11/17/23: Right: 26.63 sec, Left: 31.72 sec Goal status: Partially met - Met on Left    LONG TERM GOALS: Target date: 12/15/23  Pt will demonstrate improved ease with feeding as evidenced by decreasing PPT#2 (self feeding) by 3 secs Baseline: 14.32 sec Goal status: in progress  2.  Pt will demonstrate improved UE functional use for ADLs as evidenced by increasing box/ blocks score by 5 blocks with RUE/LUE Baseline: R: 52 and L: 50 Goal status: in progress  3.  Pt will demonstrate understanding of memory compensations  and ways to keep thinking skills sharp Baseline: impaired memory Goal status: in progress  4.  Pt will demonstrate ability to walk 20' while carrying cup without any spillage. Baseline: spilling cup of water with ambulation Goal status: in progress  ASSESSMENT:  CLINICAL IMPRESSION: Patient is a 63 y.o. male who was seen today for occupational therapy treatment session for parkinsonism with tremors.  Pt with resting tremors in BUE (L>R), however demonstrating good understanding of large amplitude exercises and carryover during  structured tasks.  Pt with decreased impact of tremors on functional tasks with coins, cards, ball toss, and simulated self-feeding.  Pt will continue to benefit from skilled occupational therapy services to address strength and coordination,  balance, GM/FM control, cognition, safety awareness, introduction of compensatory strategies/AE prn, and implementation of an HEP to improve participation and safety during ADLs, IADLs, and quality of life. Pt noted to be motivated and very pleasant - eager to participate.    PERFORMANCE DEFICITS: in functional skills including ADLs, IADLs, coordination, dexterity, sensation, ROM, strength, Fine motor control, Gross motor control, balance, body mechanics, endurance, decreased knowledge of precautions, decreased knowledge of use of DME, and UE functional use, cognitive skills including memory and safety awareness, and psychosocial skills including coping strategies, environmental adaptation, and routines and behaviors.   IMPAIRMENTS: are limiting patient from ADLs, IADLs, and leisure.     PLAN:  OT FREQUENCY: 1-2x/week  OT DURATION: 6 weeks (asking 8 weeks for scheduling)  PLANNED INTERVENTIONS: 97168 OT Re-evaluation, 97535 self care/ADL training, 02889 therapeutic exercise, 97530 therapeutic activity, 97112 neuromuscular re-education, 97750 Physical Performance Testing, balance training, functional mobility training,  psychosocial skills training, energy conservation, coping strategies training, patient/family education, and DME and/or AE instructions  RECOMMENDED OTHER SERVICES: PT  CONSULTED AND AGREED WITH PLAN OF CARE: Patient and family member/caregiver  PLAN FOR NEXT SESSION: initiate PD HEP with focus on large amplitude movements - bag exercises, review coordination HEP, educate on memory/thinking skills, attempt dual tasking with seated progressing to standing and ambulating   Chiquita JAYSON Hopping, OTR/L 11/29/2023, 11:49 AM  [facility] at [facility] [address], [address] [place], [place] [address] Phone # [phone] Fax # [phone]

## 2023-11-30 ENCOUNTER — Encounter: Attending: Psychology | Admitting: Psychology

## 2023-11-30 DIAGNOSIS — F09 Unspecified mental disorder due to known physiological condition: Secondary | ICD-10-CM

## 2023-11-30 DIAGNOSIS — G912 (Idiopathic) normal pressure hydrocephalus: Secondary | ICD-10-CM | POA: Diagnosis not present

## 2023-11-30 DIAGNOSIS — F319 Bipolar disorder, unspecified: Secondary | ICD-10-CM | POA: Diagnosis not present

## 2023-11-30 DIAGNOSIS — R251 Tremor, unspecified: Secondary | ICD-10-CM | POA: Insufficient documentation

## 2023-11-30 DIAGNOSIS — R413 Other amnesia: Secondary | ICD-10-CM | POA: Diagnosis not present

## 2023-12-04 ENCOUNTER — Ambulatory Visit: Admitting: Occupational Therapy

## 2023-12-04 ENCOUNTER — Ambulatory Visit

## 2023-12-04 DIAGNOSIS — R293 Abnormal posture: Secondary | ICD-10-CM

## 2023-12-04 DIAGNOSIS — R29818 Other symptoms and signs involving the nervous system: Secondary | ICD-10-CM

## 2023-12-04 DIAGNOSIS — R2681 Unsteadiness on feet: Secondary | ICD-10-CM

## 2023-12-04 DIAGNOSIS — R278 Other lack of coordination: Secondary | ICD-10-CM

## 2023-12-04 DIAGNOSIS — R2689 Other abnormalities of gait and mobility: Secondary | ICD-10-CM

## 2023-12-04 NOTE — Therapy (Signed)
 [facility]PAT[facility]  Treatment Note  Patient Name: Melvin Roberts MRN: [id number] DOB:[birth year], 63 y.o., male Today's Date: 12/04/2023  PCP: Zollie Lowers, MD REFERRING PROVIDER: Dohmeier, Dedra, MD  END OF SESSION:  OT End of Session - 12/04/23 1025     Visit Number 5    Number of Visits 13    Date for OT Re-Evaluation 12/15/23    Authorization Type [employer]et[employer] [id number]    OT Start Time 1018    OT Stop Time 1100    OT Time Calculation (min) 42 min    Activity Tolerance Patient tolerated treatment well    Behavior During Therapy WFL for tasks assessed/performed              Past Medical History:  Diagnosis Date   Allergy    Bipolar 1 disorder (HCC)    GERD (gastroesophageal reflux disease)    Hyperlipidemia    Hypertension    Past Surgical History:  Procedure Laterality Date   BACK SURGERY  11/17/1995   Dr. Duwayne   COLONOSCOPY     EYE SURGERY     HERNIA REPAIR  04/19/2007   umbilical   UPPER GASTROINTESTINAL ENDOSCOPY  x2   w/ esophageal dilation   Patient Active Problem List   Diagnosis Date Noted   Tremor of both hands 04/20/2023   History of bipolar disorder 04/20/2023   Short-term memory loss 04/20/2023   Cognitive attention deficit 04/20/2023   Ataxia involving legs 04/20/2023   Upper respiratory tract infection 08/17/2020   Vitamin D  deficiency 04/15/2019   BMI 32.0-32.9,adult 04/15/2019   Hypertension 09/16/2013   Bipolar disorder (HCC) 03/11/2013   Allergic rhinitis 03/11/2013   BPH (benign prostatic hyperplasia) 03/11/2013   Hyperlipidemia 05/29/2007   Depression, recurrent (HCC) 05/29/2007   Sinusitis, chronic 05/29/2007   ESOPHAGEAL STRICTURE 05/29/2007   GERD 05/29/2007   HIATAL HERNIA 05/29/2007   LOW BACK PAIN, CHRONIC 05/29/2007    ONSET DATE: referral date 10/02/23  REFERRING DIAG: R25.1 (ICD-10-CM) - Tremor of both hands R41.3 (ICD-10-CM) - Short-term memory loss Z86.59 (ICD-10-CM) - History of bipolar  disorder F33.9 (ICD-10-CM) - Depression, recurrent  THERAPY DIAG:  Other lack of coordination  Other symptoms and signs involving the nervous system  Abnormal posture  Rationale for Evaluation and Treatment: Rehabilitation  SUBJECTIVE:   SUBJECTIVE STATEMENT: Pt reports getting a lot of stuff completed around the house.  Pt accompanied by: self and significant other (spouse Melvin Roberts, dropped off)  PERTINENT HISTORY: parkinsonism, bipolar disorder, HTN, hyperlipidemia  PRECAUTIONS: Fall  WEIGHT BEARING RESTRICTIONS: No  PAIN:  Are you having pain? No just stiff  FALLS: Has patient fallen in last 6 months? Yes. Number of falls 6-8, but less since using Rollator end of May  LIVING ENVIRONMENT: Lives with: lives with their spouse Lives in: Mobile home Stairs: Yes: External: 4-5 steps; can reach both Has following equipment at home: Single point cane, Walker - 4 wheeled, shower chair, and bed assist rail  PLOF: Independent and Independent with basic ADLs  PATIENT GOALS: better balance to walk better  OBJECTIVE:  Note: Objective measures were completed at Evaluation unless otherwise noted.  HAND DOMINANCE: Right  ADLs: Overall ADLs: increased time for self-care tasks, about twice as long as before  Transfers/ambulation related to ADLs: Eating: increased spillage when eating and carrying cup, uses larger spoon Grooming: Mod I UB Dressing: Mod I LB Dressing: occasional difficulty with tying shoes, is now wearing [employer] slip ins, increased time with donning socks Toileting:  Mod I Bathing: Mod I Tub Shower transfers: Mod I Equipment: Shower seat with back and Walk in shower  IADLs: Light housekeeping: used to load and unload dishwasher but has not been feeling up to it Meal Prep: will occasionally make a sandwich Community mobility: not driving as pt would veer to right when driving Medication management: spouse fills pill box Handwriting: 75% legible, Mild  micrographia, and PPT#1 handwriting (whales live in a blue ocean): 17.87 seconds. Pt writing in all caps when writing in print.  Mild micrographia, but remained same size from start to finish.  MOBILITY STATUS: wide shuffling gait, he has noted some freezing - as if he has to tell his body to move and its doesn't do what he wants.  POSTURE COMMENTS:  Mild Right lean  ACTIVITY TOLERANCE: Activity tolerance: diminished  FUNCTIONAL OUTCOME MEASURES: Physical performance test: PPT#2 (simulated eating) 14.32 sec - 1 drop  COORDINATION: Finger Nose Finger test: tremors noted bilaterally in fingers and forearm, however not impacting motor control.  Slower overall bilaterally 9 Hole Peg test: Right: 30.81 sec; Left: 39.18 sec Box and Blocks:  Right 52 blocks, Left 50 blocks RAM: decreased quality of movement, requiring tactile cues of thighs  UE ROM:  WFL, B shoulder limited ~140-150*  UE MMT:   WFL  SENSATION: Reports occasional tingling in fingers on L hand  COGNITION: Overall cognitive status: Within functional limits for tasks assessed and pt reports memory is not as good as it was before, also difficulty with dual tasking  OBSERVATIONS: Bradykinesia                                                                                                                    TREATMENT DATE:  12/04/23 Bag exercises: Simulated ADLs for the following activities (using a plastic bag/scarf): donning shirt, drying back, pulling down shirt, and donning pants.  OT providing verbal cues and demonstration with focus on large amplitude movements and sequencing.  Pt with difficulty with picking up lower leg for simulated donning of pants.  Therefore OT modified task to lifting at hip to pick foot from ground while advancing bag under thigh.  Pt will continue to benefit from focus on amplitude to carry over to aiding in ADLs of bathing/dressing.     11/29/23 Note pt 15 mins late to session, aware that only  had 30 minutes for session. Pt had dentist appointment that ran over.  Focused initially on reaching task with 4# wrist weights at cabinet level to simulate home kitchen/IADL environment retrieving 1010 items, also functional mobility throughout gym maneuvering obstacles and reaching for cones as well retrieving 6/7 (missing 1) with wrist weights for additional proprioceptive input.  FMC: with 4# wrist weights, pt making pattern of 4 colors for clips along container for reaching, pinching, and placing with pt noted to be easily distracted and needing cues to redirect or stopping task to focus on one thing at a time.  Pt ed and demonstration on techniques for self feeding for  grounding and providing more stabilization to reduce tremors when self feeding.    11/27/23 Large amplitude hands: OT reviewed large amplitude exercises from previous session.  Pt completing forearm supination/pronation, large hand opening, and finger flicks. Pt still with mild tremor L > R with hand movements. Coordination: w/ each UE including: flipping cards one at a time off a deck, picking up coins 1 at a time and placing into coin slot, stacking/unstacking coins, picking up 5-10 coins 1 at a time to hold in palm and then translate one at a time to place into container.  OT instructing pt in large amplitude coordination tasks with focus on big open hand prior to picking up and releasing items to challenge tendency of movements to get smaller.   Pt with good carryover of opening hand intermittently throughout task. Self-feeding: engaged in simulated self-feeding with beans while OT providing demonstration and education on positioning of hand on to spoon handle, proximity towards bowl of spoon, intermittent grip strength, and various depths of bowls to increase ease and amplitude with scooping.  Pt demonstrating decreased tremors this session with self-feeding.     PATIENT EDUCATION: Education details: large amplitude movements  for ADLs Person educated: Patient and Spouse Education method: Explanation, Demonstration, Verbal cues, and Handouts Education comprehension: verbalized understanding and needs further education  HOME EXERCISE PROGRAM: PWR hands (see pt instructions)  GOALS: Goals reviewed with patient? Yes  SHORT TERM GOALS: Target date: 11/10/23  Pt will be Independent with PD specific HEP Baseline: new to OPOT Goal status: in progress  2.  Pt will verbalize understanding of task modifications, adapted strategies, and/or potential DME/AE needs to increase ease, safety, and independence w/ ADLs/IADLs. Baseline: not engaging in IADLs at previous level, and increased time for ADLs Goal status: in progress  3.  Pt will demonstrate improved fine motor coordination for ADLs as evidenced by decreasing 9 hole peg test score for RUE/LUE by 5 secs Baseline: 9 Hole Peg test: Right: 30.81 sec; Left: 39.18 sec 11/17/23: Right: 26.63 sec, Left: 31.72 sec Goal status: Partially met - Met on Left    LONG TERM GOALS: Target date: 12/15/23  Pt will demonstrate improved ease with feeding as evidenced by decreasing PPT#2 (self feeding) by 3 secs Baseline: 14.32 sec Goal status: in progress  2.  Pt will demonstrate improved UE functional use for ADLs as evidenced by increasing box/ blocks score by 5 blocks with RUE/LUE Baseline: R: 52 and L: 50 Goal status: in progress  3.  Pt will demonstrate understanding of memory compensations and ways to keep thinking skills sharp Baseline: impaired memory Goal status: in progress  4.  Pt will demonstrate ability to walk 20' while carrying cup without any spillage. Baseline: spilling cup of water with ambulation Goal status: in progress  ASSESSMENT:  CLINICAL IMPRESSION: Patient is a 63 y.o. male who was seen today for occupational therapy treatment session for parkinsonism with tremors.  Pt with resting tremors in BUE (L>R), however demonstrating good understanding of  large amplitude exercises and carryover during structured tasks.  Pt tolerating large amplitude UB activities with use of scarf to simulate ADLs, pt with increased difficulty with anterior weight shifting and lifting foot from ground to aid in simulated LB dressing tasks.  Pt receptive to modifications to progress towards activity.    PERFORMANCE DEFICITS: in functional skills including ADLs, IADLs, coordination, dexterity, sensation, ROM, strength, Fine motor control, Gross motor control, balance, body mechanics, endurance, decreased knowledge of precautions, decreased knowledge of  use of DME, and UE functional use, cognitive skills including memory and safety awareness, and psychosocial skills including coping strategies, environmental adaptation, and routines and behaviors.   IMPAIRMENTS: are limiting patient from ADLs, IADLs, and leisure.     PLAN:  OT FREQUENCY: 1-2x/week  OT DURATION: 6 weeks (asking 8 weeks for scheduling)  PLANNED INTERVENTIONS: 97168 OT Re-evaluation, 97535 self care/ADL training, 02889 therapeutic exercise, 97530 therapeutic activity, 97112 neuromuscular re-education, 97750 Physical Performance Testing, balance training, functional mobility training, psychosocial skills training, energy conservation, coping strategies training, patient/family education, and DME and/or AE instructions  RECOMMENDED OTHER SERVICES: PT  CONSULTED AND AGREED WITH PLAN OF CARE: Patient and family member/caregiver  PLAN FOR NEXT SESSION: initiate PD HEP with focus on large amplitude movements - bag exercises - review LB dressing and possible modifications, review coordination HEP, educate on memory/thinking skills, attempt dual tasking with seated progressing to standing and ambulating   Jaysie Benthall, OTR/L 12/04/2023, 10:56 AM  [facility] at [facility] [address] [place], [place] [address] Phone # [phone] Fax # [phone]

## 2023-12-04 NOTE — Therapy (Signed)
 OUTPATIENT PHYSICAL THERAPY NEURO TREATMENT  Patient Name: Melvin Roberts MRN: [id number] DOB:1961/04/07, 63 y.o., male Today's Date: 12/04/2023   PCP: [name], MD REFERRING PROVIDER: [name], MD   END OF SESSION:  PT End of Session - 12/04/23 1101     Visit Number 12    Number of Visits 16    Date for PT Re-Evaluation 12/18/23    Authorization Type aetna; no VL    PT Start Time 1100    PT Stop Time 1145    PT Time Calculation (min) 45 min    Equipment Utilized During Treatment Gait belt    Activity Tolerance Patient tolerated treatment well    Behavior During Therapy WFL for tasks assessed/performed             Past Medical History:  Diagnosis Date   Allergy    Bipolar 1 disorder (HCC)    GERD (gastroesophageal reflux disease)    Hyperlipidemia    Hypertension    Past Surgical History:  Procedure Laterality Date   BACK SURGERY  11/17/1995   Dr. Duwayne   COLONOSCOPY     EYE SURGERY     HERNIA REPAIR  04/19/2007   umbilical   UPPER GASTROINTESTINAL ENDOSCOPY  x2   w/ esophageal dilation   Patient Active Problem List   Diagnosis Date Noted   Tremor of both hands 04/20/2023   History of bipolar disorder 04/20/2023   Short-term memory loss 04/20/2023   Cognitive attention deficit 04/20/2023   Ataxia involving legs 04/20/2023   Upper respiratory tract infection 08/17/2020   Vitamin D  deficiency 04/15/2019   BMI 32.0-32.9,adult 04/15/2019   Hypertension 09/16/2013   Bipolar disorder (HCC) 03/11/2013   Allergic rhinitis 03/11/2013   BPH (benign prostatic hyperplasia) 03/11/2013   Hyperlipidemia 05/29/2007   Depression, recurrent (HCC) 05/29/2007   Sinusitis, chronic 05/29/2007   ESOPHAGEAL STRICTURE 05/29/2007   GERD 05/29/2007   HIATAL HERNIA 05/29/2007   LOW BACK PAIN, CHRONIC 05/29/2007    ONSET DATE: 10/02/23  REFERRING DIAG:  R25.1 (ICD-10-CM) - Tremor of both hands  R41.3 (ICD-10-CM) - Short-term memory loss  Z86.59  (ICD-10-CM) - History of bipolar disorder  F33.9 (ICD-10-CM) - Depression, recurrent (HCC    THERAPY DIAG:  Other symptoms and signs involving the nervous system  Abnormal posture  Unsteadiness on feet  Other abnormalities of gait and mobility  Rationale for Evaluation and Treatment: Rehabilitation  SUBJECTIVE:  SUBJECTIVE STATEMENT: Doing ok. [place] 1[place]2x[place]wk  Pt accompanied by: self (spouse: Andrea)  PERTINENT HISTORY: parkinsonism, bipolar disorder, HTN, hyperlipidemia, knee surgeries 08/2022  PAIN:  Are you having pain? Yes: NPRS scale: no Pain location: low back, knees Pain description: varies Aggravating factors: weather Relieving factors: varies  PRECAUTIONS: Fall  WEIGHT BEARING RESTRICTIONS: No  FALLS: Has patient fallen in last 6 months? Yes. Number of falls 2, in the last month. He was falling daily until getting the rollator RW in the past few months.  LIVING ENVIRONMENT: Lives with: lives with their spouse Lives in: House/apartment Stairs: Yes: External: 5 steps; can uses a rail-- he doesn't get his foot on the step well and going up he catches his foot Has following equipment at home: Walker - 4 wheeled  PLOF: Independent , declining in past year  PATIENT GOALS: walk more steady (without device would be nice)  OBJECTIVE:   TODAY'S TREATMENT: 12/04/23 Activity Comments  360 degree turns 14-16 steps w/ rollator  LE cycling Assist for speed intervals 3x2 min rounds 70+ RPM, 60 sec btwn  360 deg turns 10-12 steps   Gait training -Decreased shuffling in turns after cycling -trials in stride length w/ visual cues   balance -static multisensory -dynamic balance/coordination: bosu forward/lateral lunges w/ large amp arm movements. Sit to stand w/  medicine ball throw/toss. Step through and toss med ball alt UE/LE             HOME EXERCISE PROGRAM Last updated: 10/23/23 Sitting PWR moves 2x10 each/day     Note: Objective measures were completed at Evaluation unless otherwise noted. DIAGNOSTIC FINDINGS: DAT scan, NPH assessment  COGNITION: Overall cognitive status: Impaired; he started aricept , also notes hearing declined significantly in last year   SENSATION: L hand  COORDINATION: RAM mildly ataxic, symmetrical with FTN and opposition  MUSCLE LENGTH: Hamstrings: tightness noted bilaterally  POSTURE: No Significant postural limitations  LOWER EXTREMITY ROM:    WFLs  LOWER EXTREMITY MMT:   MMT Right Eval Left Eval  Hip flexion 5/5 5/5  Hip extension    Hip abduction    Hip adduction    Hip internal rotation    Hip external rotation    Knee flexion 5/5 5/5  Knee extension 5/5 5/5  Ankle dorsiflexion 4-/5 5/5  Ankle plantarflexion    Ankle inversion    Ankle eversion    (Blank rows = not tested)  BED MOBILITY:  Findings: Sit to supine Complete Independence  TRANSFERS: Sit to stand: Complete Independence  Assistive device utilized: to rollator RW      STAIRS: TBA-- has 5 at home  GAIT: Findings: Gait Characteristics: decreased step length- Right, decreased step length- Left, decreased stride length, decreased ankle dorsiflexion- Right, decreased ankle dorsiflexion- Left, shuffling, poor foot clearance- Right, and poor foot clearance- Left, Distance walked: 100 ft, Assistive device utilized:Walker - 4 wheeled and None, Level of assistance: SBA, and Comments: Patient ambulates with frequent festinating patterns and reports at times freezing for 1 minute in the home Gait speed=1.28 ft/sec without device Gait speed=1.76 ft/sec with rollator  FUNCTIONAL TESTS:  5 times sit to stand: 22.92 Timed up and go (TUG): 25.22, TUG cognitive is 33.60 seconds with diminished accuracy in counting Berg Balance  Scale:  Item Test date: 10/19/23 Test date: 11/27/23 Test date:   Sitting to standing 4. able to stand without using hands and stabilize independently 4. able to stand without using hands and stabilize independently  4. able  to stand safely for 2 minutes  4. able to sit safely and securely for 2 minutes   4. sits safely with minimal use of hands  4. able to transfer safely with minor use of hands  4. able to stand 10 seconds safely  4. able to place feet together independently and stand 1 minute safely  4. can reach forward confidently 25 cm (10 inches)    4. able to pick up slipper safely and easily  3. looks behind one side only, other side shows less weight shift    2. able to turn 360 degrees safely but slowly  4. ble to stand independently and safely and complete 8 steps in 20 seconds    3. able to place foot ahead independently and hold 30 seconds   2. able to lift leg independently and hold >= 3 seconds     Insert OPRCBERGREEVAL SmartPhrase at re-test date  2. Standing unsupported 4. able to stand safely for 2 minutes    3. Sitting with back unsupported, feet supported 4. able to sit safely and securely for 2 minutes    4. Standing to sitting 4. sits safely with minimal use of hands    5. Pivot transfer  4. able to transfer safely with minor use of hands    6. Standing unsupported with eyes closed 4. able to stand 10 seconds safely    7. Standing unsupported with feet together 3. able to place feet together independently and stand 1 minute with supervision    8. Reaching forward with outstretched arms while standing 2. can reach forward 5 cm (2 inches)    9. Pick up object from the floor from standing 3. able to pick up slipper but needs supervision    10. Turning to look behind over left and right shoulders while standing 2. turns sideways but only maintains balance    11. Turn 360 degrees 1. needs close supervision or verbal cuing    12. Place alternate foot on step or stool  while standing unsupported 0. needs assistance to keep from falling/unable to try    13. Standing unsupported one foot in front 2. able to take small step independently and hold 30 seconds    14. Standing on one leg 1. tries to lift leg unable to hold 3 seconds but remains standing independently.      Total Score 38/56 Total Score x 50/56 Total Score x/56   Turning= 16 steps to the R and 22 steps to the L  PATIENT SURVEYS:  Freezing of gait questionnaire Not performed                                                                                                                              [facility] Adult PT Treatment:  DATE: 10/19/23 Gait: Discussed tips to reduce freezing and practiced in session. Tips to reduce freezing episodes with standing or walking:  Stand tall with your feet wide, so that you can rock and weight shift through your hips. Don't try to fight the freeze: if you begin taking slower, faster, smaller steps, STOP, get your posture tall, and RESET your posture and balance.  Take a deep breath before taking the BIG step to start again. March in place, with high knee stepping, to get started walking again. Use auditory cues:  Count out loud, think of a familiar tune or song or cadence, use pocket metronome, to use rhythm to get started walking again. Use visual cues:  Use a line to step over, use laser pointer line to step over, (using BIG steps) to start walking again. Use visual targets to keep your posture tall (look ahead and focus on an object or target at eye level). As you approach where your destination with walking, count your steps out loud and/or focus on your target with your eyes until you are fully there. Use appropriate assistive device, as advised by your physical therapist to assist with taking longer, consistent steps.   PATIENT EDUCATION: Education details: Nature of PT, goals for therapy, tips to reduce  freezing. Person educated: Patient and Spouse Education method: Explanation, Demonstration, and Handouts Education comprehension: verbalized understanding and returned demonstration  HOME EXERCISE PROGRAM: None established-- provided tips for freezing  GOALS: Goals reviewed with patient? Yes  SHORT TERM GOALS: Target date: 11/18/23  The patient will perform HEP with wife's assist. Baseline: initiated at eval Goal status: MET  2.  The patient will improve gait speed to 2.5 ft/sec with rollater demonstrating dec'd festination. Baseline:  1.76 ft/sec; 2.11 ft/sec Goal status: NOT MET 11/27/23  3.   The patient will improve Berg to > or equal to 42/56.  Baseline:  38/56; 50/56 Goal status: MET  4.  The patient will move floor<>stand with UE support due to h/o falls. Baseline:  PT to further assess; independent Goal status: MET  LONG TERM GOALS: Target date: 12/19/23  The patient will have progression of HEP with wife's assist Baseline:  initiated at eval Goal status: IN PROGRESS  2.  The patient will improve freezing of gait by 5 points to demonstrate improved functional abilities. Baseline: 17/24 10/23/23 Goal status: IN PROGRESS  3.  The patient will improve Berg to > or equal to 46/56. Baseline:  38/56; 50/56 Goal status: MET  4.  The patient will improve TUG score < or equal to 20 seconds. Baseline:  25.22 seconds Goal status: IN PROGRESS  5.  The patient will improve 5 time sit<>stand to < 18 seconds. Baseline: 22.92 seconds Goal status: IN PROGRESS  6.  The patient will report reduction in falls to < or equal to 2x/month. Baseline:  varies-- has been as much as daily to 2x/month this year Goal status: IN PROGRESS  ASSESSMENT:  CLINICAL IMPRESSION: Forced pace cycling to increase RPM for rapid alternating movement w/ good carryover to gait w/ reduced shuffling in turns. Balance/coordination drills to improve postural stability and gross motor coordination w/ cues  for increased amplitude and step length to facilitate larger amplitude movements. Continued sessions to advance POC details to improve mobility and reduce risk fro falls.   OBJECTIVE IMPAIRMENTS: Abnormal gait, decreased balance, decreased coordination, decreased knowledge of use of DME, decreased mobility, difficulty walking, increased fascial restrictions, impaired flexibility, postural dysfunction, and pain.   ACTIVITY LIMITATIONS:  carrying, lifting, bending, standing, transfers, bed mobility, and locomotion level  PARTICIPATION LIMITATIONS: cleaning, personal finances, driving, community activity, and occupation  PERSONAL FACTORS: 3+ comorbidities: see above are also affecting patient's functional outcome.   REHAB POTENTIAL: Good  CLINICAL DECISION MAKING: Unstable/unpredictable  EVALUATION COMPLEXITY: High  PLAN:  PT FREQUENCY: 2x/week  PT DURATION: 8 weeks  PLANNED INTERVENTIONS: 97164- PT Re-evaluation, 97750- Physical Performance Testing, 97110-Therapeutic exercises, 97530- Therapeutic activity, [id number]- Neuromuscular re-education, 97535- Self Care, [id number]- Manual therapy, [phone]- Gait training, Patient/Family education, Balance training, Stair training, and DME instructions  PLAN FOR NEXT SESSION: PWR! Exercises working initially on turns and reducing freezing during gait.  *adjust height of walker   11:02 AM, 12/04/23 M. Kelly Chrisma Hurlock, PT, DPT Physical Therapist- [place] Office Number: [phone]

## 2023-12-04 NOTE — Patient Instructions (Signed)
 Bag Exercises:  Small trash bag or produce bag works best.  For all exercises, sit with big posture (sit up tall with head up) and use big movements. Perform the following exercises 1-2 times per day.  Hold bag in one hand. Stretch both arms/hands out to the side as big as you can. Then, pass bag from one hand to the other IN FRONT of you. Stretch arms back out big after each pass. Repeat 10 times. Hold bag in one hand. Stretch both arms/hands out to the side as big as you can. Then, pass bag from one hand to the other BEHIND you. Stretch arms back out big after each pass. Repeat 10 times. Hold bag in one hand. Stretch both arms/hands out to the side in a diagonal as big as you can. Then, pass bag from one hand to the other IN FRONT of you in a diagonal pattern. Stretch arms back out big after each pass. Repeat 10 times. Hold bag in right hand. Move right hand to reach behind shoulder. Then, reach behind back with left hand to pass bag from right hand to left hand. Switch sides. Repeat 10 times on each side. Hold bag in both hands in front of you with hands/arms shoulder length apart. Lift leg and move bag completely under each foot and back. Repeat 10 times on each side. Hold bag in both hands in front of you with hands/arms shoulder length apart. Move bag behind your head. Repeat 10 times.

## 2023-12-05 ENCOUNTER — Encounter (HOSPITAL_BASED_OUTPATIENT_CLINIC_OR_DEPARTMENT_OTHER): Admitting: Psychology

## 2023-12-05 DIAGNOSIS — G912 (Idiopathic) normal pressure hydrocephalus: Secondary | ICD-10-CM

## 2023-12-05 DIAGNOSIS — R413 Other amnesia: Secondary | ICD-10-CM | POA: Diagnosis not present

## 2023-12-05 DIAGNOSIS — F09 Unspecified mental disorder due to known physiological condition: Secondary | ICD-10-CM | POA: Diagnosis not present

## 2023-12-05 DIAGNOSIS — F319 Bipolar disorder, unspecified: Secondary | ICD-10-CM | POA: Diagnosis not present

## 2023-12-05 NOTE — Therapy (Signed)
 OUTPATIENT PHYSICAL THERAPY NEURO TREATMENT  Patient Name: Melvin Roberts MRN: [id number] DOB:July 04, 1960, 63 y.o., male Today's Date: 12/06/2023   PCP: [name], MD REFERRING PROVIDER: [name], MD   END OF SESSION:  PT End of Session - 12/06/23 1145     Visit Number 13    Number of Visits 16    Date for PT Re-Evaluation 12/18/23    Authorization Type aetna; no VL    PT Start Time 1100    PT Stop Time 1142    PT Time Calculation (min) 42 min    Equipment Utilized During Treatment Gait belt    Activity Tolerance Patient tolerated treatment well    Behavior During Therapy WFL for tasks assessed/performed              Past Medical History:  Diagnosis Date   Allergy    Bipolar 1 disorder (HCC)    GERD (gastroesophageal reflux disease)    Hyperlipidemia    Hypertension    Past Surgical History:  Procedure Laterality Date   BACK SURGERY  11/17/1995   Dr. Duwayne   COLONOSCOPY     EYE SURGERY     HERNIA REPAIR  04/19/2007   umbilical   UPPER GASTROINTESTINAL ENDOSCOPY  x2   w/ esophageal dilation   Patient Active Problem List   Diagnosis Date Noted   Tremor of both hands 04/20/2023   History of bipolar disorder 04/20/2023   Short-term memory loss 04/20/2023   Cognitive attention deficit 04/20/2023   Ataxia involving legs 04/20/2023   Upper respiratory tract infection 08/17/2020   Vitamin D  deficiency 04/15/2019   BMI 32.0-32.9,adult 04/15/2019   Hypertension 09/16/2013   Bipolar disorder (HCC) 03/11/2013   Allergic rhinitis 03/11/2013   BPH (benign prostatic hyperplasia) 03/11/2013   Hyperlipidemia 05/29/2007   Depression, recurrent (HCC) 05/29/2007   Sinusitis, chronic 05/29/2007   ESOPHAGEAL STRICTURE 05/29/2007   GERD 05/29/2007   HIATAL HERNIA 05/29/2007   LOW BACK PAIN, CHRONIC 05/29/2007    ONSET DATE: 10/02/23  REFERRING DIAG:  R25.1 (ICD-10-CM) - Tremor of both hands  R41.3 (ICD-10-CM) - Short-term memory loss  Z86.59  (ICD-10-CM) - History of bipolar disorder  F33.9 (ICD-10-CM) - Depression, recurrent (HCC    THERAPY DIAG:  Other symptoms and signs involving the nervous system  Abnormal posture  Unsteadiness on feet  Other abnormalities of gait and mobility  Rationale for Evaluation and Treatment: Rehabilitation  SUBJECTIVE:  SUBJECTIVE STATEMENT: Getting some work done on his house.   Pt accompanied by: self (spouse: [name])  PERTINENT HISTORY: parkinsonism, bipolar disorder, HTN, hyperlipidemia, knee surgeries 08/2022  PAIN:  Are you having pain? Yes: NPRS scale: I'm just a little bit sore but not bad Pain location: low back, knees Pain description: varies Aggravating factors: weather Relieving factors: varies  PRECAUTIONS: Fall  WEIGHT BEARING RESTRICTIONS: No  FALLS: Has patient fallen in last 6 months? Yes. Number of falls 2, in the last month. He was falling daily until getting the rollator RW in the past few months.  LIVING ENVIRONMENT: Lives with: lives with their spouse Lives in: House/apartment Stairs: Yes: External: 5 steps; can uses a rail-- he doesn't get his foot on the step well and going up he catches his foot Has following equipment at home: Walker - 4 wheeled  PLOF: Independent , declining in past year  PATIENT GOALS: walk more steady (without device would be nice)  OBJECTIVE:     TODAY'S TREATMENT: 12/06/23 Activity Comments  Nustep L5 x 6 min UEs/LEs  For neural priming, last minute all out:  standing PWR rock targets to initiate wt shifting for turns Reaching to therapy poles. Cueing for LE wt shift rather than excessive UE reach  Short distance gait to targets in gym without AD Focus on lateral wt shift, then large confident step (indicating # of steps to target) and  walking around therapy pole to practice turns     PATIENT EDUCATION: Education details: provided edu on PD freeze tips and added notes on: STOP, SIGH, SHIFT, STEP; discussed pt's plans to participate in PD spin class, edu on PD community resources and [place] PWR class, remainder of POC Person educated: Patient Education method: Explanation, Demonstration, Tactile cues, Verbal cues, and Handouts Education comprehension: verbalized understanding and returned demonstration    HOME EXERCISE PROGRAM Last updated: 10/23/23 Sitting PWR moves 2x10 each/day     Note: Objective measures were completed at Evaluation unless otherwise noted. DIAGNOSTIC FINDINGS: DAT scan, NPH assessment  COGNITION: Overall cognitive status: Impaired; he started aricept , also notes hearing declined significantly in last year   SENSATION: L hand  COORDINATION: RAM mildly ataxic, symmetrical with FTN and opposition  MUSCLE LENGTH: Hamstrings: tightness noted bilaterally  POSTURE: No Significant postural limitations  LOWER EXTREMITY ROM:    WFLs  LOWER EXTREMITY MMT:   MMT Right Eval Left Eval  Hip flexion 5/5 5/5  Hip extension    Hip abduction    Hip adduction    Hip internal rotation    Hip external rotation    Knee flexion 5/5 5/5  Knee extension 5/5 5/5  Ankle dorsiflexion 4-/5 5/5  Ankle plantarflexion    Ankle inversion    Ankle eversion    (Blank rows = not tested)  BED MOBILITY:  Findings: Sit to supine Complete Independence  TRANSFERS: Sit to stand: Complete Independence  Assistive device utilized: to rollator RW      STAIRS: TBA-- has 5 at home  GAIT: Findings: Gait Characteristics: decreased step length- Right, decreased step length- Left, decreased stride length, decreased ankle dorsiflexion- Right, decreased ankle dorsiflexion- Left, shuffling, poor foot clearance- Right, and poor foot clearance- Left, Distance walked: 100 ft, Assistive device utilized:Walker - 4 wheeled  and None, Level of assistance: SBA, and Comments: Patient ambulates with frequent festinating patterns and reports at times freezing for 1 minute in the home Gait speed=1.28 ft/sec without device Gait speed=1.76 ft/sec with rollator  FUNCTIONAL TESTS:  5  times sit to stand: 22.92 Timed up and go (TUG): 25.22, TUG cognitive is 33.60 seconds with diminished accuracy in counting Berg Balance Scale:  Item Test date: 10/19/23 Test date: 11/27/23 Test date:   Sitting to standing 4. able to stand without using hands and stabilize independently 4. able to stand without using hands and stabilize independently  4. able to stand safely for 2 minutes  4. able to sit safely and securely for 2 minutes   4. sits safely with minimal use of hands  4. able to transfer safely with minor use of hands  4. able to stand 10 seconds safely  4. able to place feet together independently and stand 1 minute safely  4. can reach forward confidently 25 cm (10 inches)    4. able to pick up slipper safely and easily  3. looks behind one side only, other side shows less weight shift    2. able to turn 360 degrees safely but slowly  4. ble to stand independently and safely and complete 8 steps in 20 seconds    3. able to place foot ahead independently and hold 30 seconds   2. able to lift leg independently and hold >= 3 seconds     Insert OPRCBERGREEVAL SmartPhrase at re-test date  2. Standing unsupported 4. able to stand safely for 2 minutes    3. Sitting with back unsupported, feet supported 4. able to sit safely and securely for 2 minutes    4. Standing to sitting 4. sits safely with minimal use of hands    5. Pivot transfer  4. able to transfer safely with minor use of hands    6. Standing unsupported with eyes closed 4. able to stand 10 seconds safely    7. Standing unsupported with feet together 3. able to place feet together independently and stand 1 minute with supervision    8. Reaching forward with  outstretched arms while standing 2. can reach forward 5 cm (2 inches)    9. Pick up object from the floor from standing 3. able to pick up slipper but needs supervision    10. Turning to look behind over left and right shoulders while standing 2. turns sideways but only maintains balance    11. Turn 360 degrees 1. needs close supervision or verbal cuing    12. Place alternate foot on step or stool while standing unsupported 0. needs assistance to keep from falling/unable to try    13. Standing unsupported one foot in front 2. able to take small step independently and hold 30 seconds    14. Standing on one leg 1. tries to lift leg unable to hold 3 seconds but remains standing independently.      Total Score 38/56 Total Score x 50/56 Total Score x/56   Turning= 16 steps to the R and 22 steps to the L  PATIENT SURVEYS:  Freezing of gait questionnaire Not performed  [facility] Adult PT Treatment:                                                DATE: 10/19/23 Gait: Discussed tips to reduce freezing and practiced in session. Tips to reduce freezing episodes with standing or walking:  Stand tall with your feet wide, so that you can rock and weight shift through your hips. Don't try to fight the freeze: if you begin taking slower, faster, smaller steps, STOP, get your posture tall, and RESET your posture and balance.  Take a deep breath before taking the BIG step to start again. March in place, with high knee stepping, to get started walking again. Use auditory cues:  Count out loud, think of a familiar tune or song or cadence, use pocket metronome, to use rhythm to get started walking again. Use visual cues:  Use a line to step over, use laser pointer line to step over, (using BIG steps) to start walking again. Use visual targets to keep your posture tall (look ahead and focus on an  object or target at eye level). As you approach where your destination with walking, count your steps out loud and/or focus on your target with your eyes until you are fully there. Use appropriate assistive device, as advised by your physical therapist to assist with taking longer, consistent steps.   PATIENT EDUCATION: Education details: Nature of PT, goals for therapy, tips to reduce freezing. Person educated: Patient and Spouse Education method: Explanation, Demonstration, and Handouts Education comprehension: verbalized understanding and returned demonstration  HOME EXERCISE PROGRAM: None established-- provided tips for freezing  GOALS: Goals reviewed with patient? Yes  SHORT TERM GOALS: Target date: 11/18/23  The patient will perform HEP with wife's assist. Baseline: initiated at eval Goal status: MET  2.  The patient will improve gait speed to 2.5 ft/sec with rollater demonstrating dec'd festination. Baseline:  1.76 ft/sec; 2.11 ft/sec Goal status: NOT MET 11/27/23  3.   The patient will improve Berg to > or equal to 42/56.  Baseline:  38/56; 50/56 Goal status: MET  4.  The patient will move floor<>stand with UE support due to h/o falls. Baseline:  PT to further assess; independent Goal status: MET  LONG TERM GOALS: Target date: 12/19/23  The patient will have progression of HEP with wife's assist Baseline:  initiated at eval Goal status: IN PROGRESS  2.  The patient will improve freezing of gait by 5 points to demonstrate improved functional abilities. Baseline: 17/24 10/23/23 Goal status: IN PROGRESS  3.  The patient will improve Berg to > or equal to 46/56. Baseline:  38/56; 50/56 Goal status: MET  4.  The patient will improve TUG score < or equal to 20 seconds. Baseline:  25.22 seconds Goal status: IN PROGRESS  5.  The patient will improve 5 time sit<>stand to < 18 seconds. Baseline: 22.92 seconds Goal status: IN PROGRESS  6.  The patient will report  reduction in falls to < or equal to 2x/month. Baseline:  varies-- has been as much as daily to 2x/month this year Goal status: IN PROGRESS  ASSESSMENT:  CLINICAL IMPRESSION: Patient arrived to session without complaints. Worked on addressing freezing with turns and gait today by using visual targets and landmarks to encourage large amplitude steps throughout. Patient responded very well and demonstrated good effort with activities. Edu provided  on tips to address freezing as well as PD community resources to maintain fitness post-DC.   OBJECTIVE IMPAIRMENTS: Abnormal gait, decreased balance, decreased coordination, decreased knowledge of use of DME, decreased mobility, difficulty walking, increased fascial restrictions, impaired flexibility, postural dysfunction, and pain.   ACTIVITY LIMITATIONS: carrying, lifting, bending, standing, transfers, bed mobility, and locomotion level  PARTICIPATION LIMITATIONS: cleaning, personal finances, driving, community activity, and occupation  PERSONAL FACTORS: 3+ comorbidities: see above are also affecting patient's functional outcome.   REHAB POTENTIAL: Good  CLINICAL DECISION MAKING: Unstable/unpredictable  EVALUATION COMPLEXITY: High  PLAN:  PT FREQUENCY: 2x/week  PT DURATION: 8 weeks  PLANNED INTERVENTIONS: 97164- PT Re-evaluation, 97750- Physical Performance Testing, 97110-Therapeutic exercises, 97530- Therapeutic activity, [id number]- Neuromuscular re-education, 97535- Self Care, [id number]- Manual therapy, [phone]- Gait training, Patient/Family education, Balance training, Stair training, and DME instructions  PLAN FOR NEXT SESSION: PWR! Exercises working initially on turns and reducing freezing during gait- may review STOP, SIGH, SHIFT, STEP.  *adjust height of walker   Louana Terrilyn Christians, [place], DPT 12/06/23 11:47 AM  [facility] at [facility] [address] [place], [address] [place], [place] [address] Phone # [phone] Fax # [phone]

## 2023-12-06 ENCOUNTER — Ambulatory Visit: Admitting: Occupational Therapy

## 2023-12-06 ENCOUNTER — Ambulatory Visit: Admitting: Physical Therapy

## 2023-12-06 ENCOUNTER — Encounter: Payer: Self-pay | Admitting: Physical Therapy

## 2023-12-06 DIAGNOSIS — R29818 Other symptoms and signs involving the nervous system: Secondary | ICD-10-CM

## 2023-12-06 DIAGNOSIS — R293 Abnormal posture: Secondary | ICD-10-CM

## 2023-12-06 DIAGNOSIS — R2689 Other abnormalities of gait and mobility: Secondary | ICD-10-CM

## 2023-12-06 DIAGNOSIS — R2681 Unsteadiness on feet: Secondary | ICD-10-CM

## 2023-12-06 DIAGNOSIS — R278 Other lack of coordination: Secondary | ICD-10-CM

## 2023-12-06 NOTE — Therapy (Signed)
 OUTPATIENT OCC[facility]ATIONAL THERAPY [facility]  Treatment Note  Patient Name: Melvin Roberts MRN: [id number] DOB:Aug 10, 1960, 63 y.o., male Today's Date: 12/06/2023  PCP: Zollie Lowers, MD REFERRING PROVIDER: Dohmeier, Dedra, MD  END OF SESSION:  OT End of Session - 12/06/23 1024     Visit Number 6    Number of Visits 13    Date for OT Re-Evaluation 12/15/23    Authorization Type [employer] [id number]    OT Start Time 1020    OT Stop Time 1100    OT Time Calculation (min) 40 min    Activity Tolerance Patient tolerated treatment well    Behavior During Therapy WFL for tasks assessed/performed               Past Medical History:  Diagnosis Date   Allergy    Bipolar 1 disorder (HCC)    GERD (gastroesophageal reflux disease)    Hyperlipidemia    Hypertension    Past Surgical History:  Procedure Laterality Date   BACK SURGERY  11/17/1995   Dr. Duwayne   COLONOSCOPY     EYE SURGERY     HERNIA REPAIR  04/19/2007   umbilical   UPPER GASTROINTESTINAL ENDOSCOPY  x2   w/ esophageal dilation   Patient Active Problem List   Diagnosis Date Noted   Tremor of both hands 04/20/2023   History of bipolar disorder 04/20/2023   Short-term memory loss 04/20/2023   Cognitive attention deficit 04/20/2023   Ataxia involving legs 04/20/2023   Upper respiratory tract infection 08/17/2020   Vitamin D  deficiency 04/15/2019   BMI 32.0-32.9,adult 04/15/2019   Hypertension 09/16/2013   Bipolar disorder (HCC) 03/11/2013   Allergic rhinitis 03/11/2013   BPH (benign prostatic hyperplasia) 03/11/2013   Hyperlipidemia 05/29/2007   Depression, recurrent (HCC) 05/29/2007   Sinusitis, chronic 05/29/2007   ESOPHAGEAL STRICTURE 05/29/2007   GERD 05/29/2007   HIATAL HERNIA 05/29/2007   LOW BACK PAIN, CHRONIC 05/29/2007    ONSET DATE: referral date 10/02/23  REFERRING DIAG: R25.1 (ICD-10-CM) - Tremor of both hands R41.3 (ICD-10-CM) - Short-term memory loss Z86.59 (ICD-10-CM) - History of  bipolar disorder F33.9 (ICD-10-CM) - Depression, recurrent  THERAPY DIAG:  Other symptoms and signs involving the nervous system  Abnormal posture  Unsteadiness on feet  Other lack of coordination  Rationale for Evaluation and Treatment: Rehabilitation  SUBJECTIVE:   SUBJECTIVE STATEMENT: Pt reports that the dentist is making new molds of his teeth for a new partial.  Pt accompanied by: self and significant other (spouse Melvin Roberts, dropped off)  PERTINENT HISTORY: parkinsonism, bipolar disorder, HTN, hyperlipidemia  PRECAUTIONS: Fall  WEIGHT BEARING RESTRICTIONS: No  PAIN:  Are you having pain? No just stiff  FALLS: Has patient fallen in last 6 months? Yes. Number of falls 6-8, but less since using Rollator end of May  LIVING ENVIRONMENT: Lives with: lives with their spouse Lives in: Mobile home Stairs: Yes: External: 4-5 steps; can reach both Has following equipment at home: Single point cane, Walker - 4 wheeled, shower chair, and bed assist rail  PLOF: Independent and Independent with basic ADLs  PATIENT GOALS: better balance to walk better  OBJECTIVE:  Note: Objective measures were completed at Evaluation unless otherwise noted.  HAND DOMINANCE: Right  ADLs: Overall ADLs: increased time for self-care tasks, about twice as long as before  Transfers/ambulation related to ADLs: Eating: increased spillage when eating and carrying cup, uses larger spoon Grooming: Mod I UB Dressing: Mod I LB Dressing: occasional difficulty with tying shoes, is now  wearing [employer]chers slip ins, increased time with donning socks Toileting: Mod I Bathing: Mod I Tub Shower transfers: Mod I Equipment: Shower seat with back and Walk in shower  IADLs: Light housekeeping: used to load and unload dishwasher but has not been feeling up to it Meal Prep: will occasionally make a sandwich Community mobility: not driving as pt would veer to right when driving Medication management: spouse  fills pill box Handwriting: 75% legible, Mild micrographia, and PPT#1 handwriting (whales live in a blue ocean): 17.87 seconds. Pt writing in all caps when writing in print.  Mild micrographia, but remained same size from start to finish.  MOBILITY STATUS: wide shuffling gait, he has noted some freezing - as if he has to tell his body to move and its doesn't do what he wants.  POSTURE COMMENTS:  Mild Right lean  ACTIVITY TOLERANCE: Activity tolerance: diminished  FUNCTIONAL OUTCOME MEASURES: Physical performance test: PPT#2 (simulated eating) 14.32 sec - 1 drop  COORDINATION: Finger Nose Finger test: tremors noted bilaterally in fingers and forearm, however not impacting motor control.  Slower overall bilaterally 9 Hole Peg test: Right: 30.81 sec; Left: 39.18 sec Box and Blocks:  Right 52 blocks, Left 50 blocks RAM: decreased quality of movement, requiring tactile cues of thighs  UE ROM:  WFL, B shoulder limited ~140-150*  UE MMT:   WFL  SENSATION: Reports occasional tingling in fingers on L hand  COGNITION: Overall cognitive status: Within functional limits for tasks assessed and pt reports memory is not as good as it was before, also difficulty with dual tasking  OBSERVATIONS: Bradykinesia                                                                                                                    TREATMENT DATE:  12/06/23 Large amplitude: engaged in reaching outside BOS, across midline, and to high and low targets with focus on trunk rotation and weight shifting.  OT providing demonstration, close supervision, and cues for sequencing and amplitude.  Placed Rollator in front of pt to allow for UE support as needed, used intermittently especially when crossing midline or reaching to lower targets. OT increased challenge to naming items based on color of target to incorporate cognition.  Pt with increased difficulty with cognitive/motor dual tasking.   Coordination: w/ each UE  including: flipping cards one at a time off a deck.  OT instructing pt in large amplitude coordination tasks with focus on big open hand prior to picking up and releasing items to challenge tendency of movements to get smaller.   Pt with good carryover of opening hand intermittently throughout task. Box and blocks: R: 58 blocks  (pt hitting barrier x5-6) and L: 64.  OT reiterating education on large amplitude, especially as pt demonstrating decreased amplitude with RUE as task continued.      12/04/23 Bag exercises: Simulated ADLs for the following activities (using a plastic bag/scarf): donning shirt, drying back, pulling down shirt, and donning pants.  OT providing verbal cues  and demonstration with focus on large amplitude movements and sequencing.  Pt with difficulty with picking up lower leg for simulated donning of pants.  Therefore OT modified task to lifting at hip to pick foot from ground while advancing bag under thigh.  Pt will continue to benefit from focus on amplitude to carry over to aiding in ADLs of bathing/dressing.     11/29/23 Note pt 15 mins late to session, aware that only had 30 minutes for session. Pt had dentist appointment that ran over.  Focused initially on reaching task with 4# wrist weights at cabinet level to simulate home kitchen/IADL environment retrieving 1010 items, also functional mobility throughout gym maneuvering obstacles and reaching for cones as well retrieving 6/7 (missing 1) with wrist weights for additional proprioceptive input.  FMC: with 4# wrist weights, pt making pattern of 4 colors for clips along container for reaching, pinching, and placing with pt noted to be easily distracted and needing cues to redirect or stopping task to focus on one thing at a time.  Pt ed and demonstration on techniques for self feeding for grounding and providing more stabilization to reduce tremors when self feeding.     PATIENT EDUCATION: Education details: large  amplitude movements  Person educated: Patient and Spouse Education method: Explanation, Facilities manager, Verbal cues, and Handouts Education comprehension: verbalized understanding and needs further education  HOME EXERCISE PROGRAM: PWR hands (see pt instructions)  GOALS: Goals reviewed with patient? Yes  SHORT TERM GOALS: Target date: 11/10/23  Pt will be Independent with PD specific HEP Baseline: new to OPOT Goal status: in progress  2.  Pt will verbalize understanding of task modifications, adapted strategies, and/or potential DME/AE needs to increase ease, safety, and independence w/ ADLs/IADLs. Baseline: not engaging in IADLs at previous level, and increased time for ADLs Goal status: in progress  3.  Pt will demonstrate improved fine motor coordination for ADLs as evidenced by decreasing 9 hole peg test score for RUE/LUE by 5 secs Baseline: 9 Hole Peg test: Right: 30.81 sec; Left: 39.18 sec 11/17/23: Right: 26.63 sec, Left: 31.72 sec Goal status: Partially met - Met on Left    LONG TERM GOALS: Target date: 12/15/23  Pt will demonstrate improved ease with feeding as evidenced by decreasing PPT#2 (self feeding) by 3 secs Baseline: 14.32 sec Goal status: in progress  2.  Pt will demonstrate improved UE functional use for ADLs as evidenced by increasing box/ blocks score by 5 blocks with RUE/LUE Baseline: R: 52 and L: 50 12/06/23: R: 58 blocks  (pt hitting barrier x5-6) and L: 64 Goal status: MET  3.  Pt will demonstrate understanding of memory compensations and ways to keep thinking skills sharp Baseline: impaired memory Goal status: in progress  4.  Pt will demonstrate ability to walk 20' while carrying cup without any spillage. Baseline: spilling cup of water with ambulation Goal status: in progress  ASSESSMENT:  CLINICAL IMPRESSION: Patient is a 63 y.o. male who was seen today for occupational therapy treatment session for parkinsonism with tremors.  Pt with resting  tremors in BUE (L>R), however demonstrating good understanding of large amplitude exercises and good carryover during reaching activity this session.  Pt does continue to benefit from verbal cues for amplitude as task continues both with reaching and noticed during Box and Blocks assessment.  Pt demonstrating increased difficulty with cognitive/motor dual tasking.   PERFORMANCE DEFICITS: in functional skills including ADLs, IADLs, coordination, dexterity, sensation, ROM, strength, Fine motor control, Gross motor control,  balance, body mechanics, endurance, decreased knowledge of precautions, decreased knowledge of use of DME, and UE functional use, cognitive skills including memory and safety awareness, and psychosocial skills including coping strategies, environmental adaptation, and routines and behaviors.   IMPAIRMENTS: are limiting patient from ADLs, IADLs, and leisure.     PLAN:  OT FREQUENCY: 1-2x/week  OT DURATION: 6 weeks (asking 8 weeks for scheduling)  PLANNED INTERVENTIONS: 97168 OT Re-evaluation, 97535 self care/ADL training, 02889 therapeutic exercise, 97530 therapeutic activity, 97112 neuromuscular re-education, 97750 Physical Performance Testing, balance training, functional mobility training, psychosocial skills training, energy conservation, coping strategies training, patient/family education, and DME and/or AE instructions  RECOMMENDED OTHER SERVICES: PT  CONSULTED AND AGREED WITH PLAN OF CARE: Patient and family member/caregiver  PLAN FOR NEXT SESSION: initiate PD HEP with focus on large amplitude movements - bag exercises - review LB dressing and possible modifications, review coordination HEP, educate on memory/thinking skills, attempt dual tasking with seated progressing to standing and ambulating   Yul Diana, OTR/L 12/06/2023, 10:25 AM  [facility] at [facility] [address], [address] [place], [place] [address] Phone # [phone] Fax # [phone]

## 2023-12-06 NOTE — Patient Instructions (Signed)
Tips to reduce freezing episodes with standing or walking:  Stand tall with your feet wide, so that you can rock and weight shift through your hips. Don't try to fight the freeze: if you begin taking slower, faster, smaller steps, STOP, get your posture tall, and RESET your posture and balance.  Take a deep breath before taking the BIG step to start again. March in place, with high knee stepping, to get started walking again. Use auditory cues:  Count out loud, think of a familiar tune or song or cadence, use pocket metronome, to use rhythm to get started walking again. Use visual cues:  Use a line to step over, use laser pointer line to step over, (using BIG steps) to start walking again. Use visual targets to keep your posture tall (look ahead and focus on an object or target at eye level). As you approach where your destination with walking, count your steps out loud and/or focus on your target with your eyes until you are fully there. Use appropriate assistive device, as advised by your physical therapist to assist with taking longer, consistent steps.  

## 2023-12-07 ENCOUNTER — Encounter: Payer: Self-pay | Admitting: Adult Health

## 2023-12-07 ENCOUNTER — Ambulatory Visit: Admitting: Adult Health

## 2023-12-07 ENCOUNTER — Other Ambulatory Visit: Payer: Self-pay

## 2023-12-07 DIAGNOSIS — Z79899 Other long term (current) drug therapy: Secondary | ICD-10-CM | POA: Diagnosis not present

## 2023-12-07 DIAGNOSIS — F411 Generalized anxiety disorder: Secondary | ICD-10-CM

## 2023-12-07 DIAGNOSIS — F319 Bipolar disorder, unspecified: Secondary | ICD-10-CM

## 2023-12-07 DIAGNOSIS — G4700 Insomnia, unspecified: Secondary | ICD-10-CM

## 2023-12-07 NOTE — Progress Notes (Signed)
 Melvin Roberts [id number] 20-Mar-1961 63 y.o.  Subjective:   Patient ID:  Melvin Roberts is a 63 y.o. (DOB [birth year]) male.  Chief Complaint: No chief complaint on file.   HPI Melvin Roberts presents to the office today for follow-up of Bipolar Disorder, GAD and insomnia.  Accompanied by wife. Wife feels like he is doing a little better.   Describes mood today as ok. Mood symptoms - reports decreased depression - I have more hope now. Reports improved interest and motivation. Reports anxiety about the future - son. Denies irritability. Denies panic attacks. Denies over thinking - at times. Denies worry and rumination. Worried about his son - admitted at [facility]. Reports mood is stable. Stating I feel like I'm doing better. Taking medications as prescribed. Energy levels improved - a little better. Active, does not have a regular exercise routine - walking more Working with P/T. Enjoys some usual interests and activities. Married. Lives with wife. Has 5 children and 11 grandchildren. Spending time with family. Appetite adequate. Weight loss - 225 from 242 pounds. Reports sleep consistent. Averages 7 to 8 hours. Denies recent daytime napping. Reports focus and concentration difficulties - it's been better - slowly getting better. Managing minimal aspects of household - doing small tasks around the house. Disabled. Denies SI or HI.  Denies AH or VH. Denies self harm. Denies substance use. Reports hearing loss.  Previous medication trials: Lithium , Seroquel    GAD-7    Flowsheet Row Office Visit from 09/20/2023 in [facility] Office Visit from 09/05/2023 in [facility] Office Visit from 08/21/2023 in [facility] Office Visit from 08/03/2023 in [facility] Office Visit from 06/08/2023 in [facility]   Total GAD-7 Score 1 1 2 4 10    Mini-Mental    Flowsheet Row Office Visit from 04/20/2023 in [facility] Office Visit from 03/22/2023 in [facility]  Total Score (max 30 points ) 25 30   PHQ2-9    Flowsheet Row Office Visit from 09/20/2023 in [facility] Office Visit from 09/05/2023 in [facility] Office Visit from 08/21/2023 in [facility] Office Visit from 08/03/2023 in [facility] Office Visit from 06/08/2023 in [facility]  PHQ-2 Total Score 0 2 1 2  0  PHQ-9 Total Score 3 6 6 5 5    Flowsheet Row ED from 09/10/2023 in [facility] Emergency Department at [facility] ED from 02/26/2022 in [facility] Emergency Department at [facility] ED from 09/20/2020 in [facility] Emergency Department at [facility]  C-SSRS RISK CATEGORY No Risk No Risk No Risk     Review of Systems:  Review of Systems  Musculoskeletal:  Negative for gait problem.  Neurological:  Negative for tremors.  Psychiatric/Behavioral:         Please refer to HPI    Medications: I have reviewed the patient's current medications.  Current Outpatient Medications  Medication Sig Dispense Refill   allopurinol  (ZYLOPRIM ) 300 MG tablet Take 1 tablet (300 mg total) by mouth daily. 90 tablet 1   carbidopa -levodopa  (SINEMET  IR) 25-100 MG tablet Take 1 tablet by mouth 3 (three) times daily. Take med 30 minutes before a meal. 270 tablet 3   cholecalciferol (VITAMIN D ) 1000 UNITS tablet Take 1,000  Units by mouth daily.     colchicine  0.6 MG tablet Take twice daily for gout attack. (may take every two hours up to 6 doses at acute onset) 60 tablet 2   divalproex  (DEPAKOTE ) 500 MG DR tablet Take two tablets in the morning and take two tablets at bedtime. 360 tablet 1   donepezil  (ARICEPT )  5 MG tablet TAKE 1 TABLET BY MOUTH EVERYDAY AT BEDTIME 90 tablet 0   famotidine  (PEPCID ) 20 MG tablet Take 1 tablet (20 mg total) by mouth 2 (two) times daily. 180 tablet 3   methocarbamol  (ROBAXIN ) 500 MG tablet TAKE 1 TABLET BY MOUTH 4 TIMES DAILY. 120 tablet 0   pantoprazole  (PROTONIX ) 40 MG tablet Take 1 tablet (40 mg total) by mouth daily. 90 tablet 3   tamsulosin  (FLOMAX ) 0.4 MG CAPS capsule Take 2 capsules (0.8 mg total) by mouth at bedtime. For urine flow and prostate 180 capsule 3   No current facility-administered medications for this visit.    Medication Side Effects: None  Allergies:  Allergies  Allergen Reactions   Lipitor [Atorvastatin] Other (See Comments)   Niacin Other (See Comments)    Hot flash    Past Medical History:  Diagnosis Date   Allergy    Bipolar 1 disorder (HCC)    GERD (gastroesophageal reflux disease)    Hyperlipidemia    Hypertension     Past Medical History, Surgical history, Social history, and Family history were reviewed and updated as appropriate.   Please see review of systems for further details on the patient's review from today.   Objective:   Physical Exam:  There were no vitals taken for this visit.  Physical Exam Constitutional:      General: He is not in acute distress. Musculoskeletal:        General: No deformity.  Neurological:     Mental Status: He is alert and oriented to person, place, and time.     Coordination: Coordination normal.  Psychiatric:        Attention and Perception: Attention and perception normal. He does not perceive auditory or visual hallucinations.        Mood and Affect: Mood normal. Mood is not anxious or depressed. Affect is not labile, blunt, angry or inappropriate.        Speech: Speech normal.        Behavior: Behavior normal.        Thought Content: Thought content normal. Thought content is not paranoid or delusional. Thought content does not include homicidal or suicidal ideation. Thought  content does not include homicidal or suicidal plan.        Cognition and Memory: Cognition and memory normal.        Judgment: Judgment normal.     Comments: Insight intact     Lab Review:     Component Value Date/Time   NA 137 09/10/2023 2039   NA 143 09/05/2023 1629   K 4.4 09/10/2023 2039   CL 101 09/10/2023 2039   CO2 25 09/10/2023 2039   GLUCOSE 97 09/10/2023 2039   BUN 18 09/10/2023 2039   BUN 10 09/05/2023 1629   CREATININE 1.55 (H) 09/10/2023 2039   CALCIUM  9.7 09/10/2023 2039   PROT 6.8 09/10/2023 2039   PROT 6.2 09/05/2023 1629   ALBUMIN 3.6 09/10/2023 2039   ALBUMIN 3.5 (L) 09/05/2023 1629   AST 19 09/10/2023 2039   ALT <5 09/10/2023 2039   ALKPHOS 58 09/10/2023 2039   BILITOT 0.2 09/10/2023  2039   BILITOT 0.3 09/05/2023 1629   GFRNONAA 50 (L) 09/10/2023 2039   GFRAA 61 05/29/2020 0854       Component Value Date/Time   WBC 7.0 09/10/2023 2039   RBC 3.79 (L) 09/10/2023 2039   HGB 11.4 (L) 09/10/2023 2039   HGB 11.2 (L) 09/05/2023 1629   HCT 35.3 (L) 09/10/2023 2039   HCT 34.4 (L) 09/05/2023 1629   PLT 193 09/10/2023 2039   PLT 194 09/05/2023 1629   MCV 93.1 09/10/2023 2039   MCV 94 09/05/2023 1629   MCH 30.1 09/10/2023 2039   MCHC 32.3 09/10/2023 2039   RDW 14.6 09/10/2023 2039   RDW 15.1 09/05/2023 1629   LYMPHSABS 1.9 09/10/2023 2039   LYMPHSABS 1.6 09/05/2023 1629   MONOABS 0.6 09/10/2023 2039   EOSABS 0.1 09/10/2023 2039   EOSABS 0.1 09/05/2023 1629   BASOSABS 0.0 09/10/2023 2039   BASOSABS 0.0 09/05/2023 1629    Lithium  Lvl  Date Value Ref Range Status  06/09/2022 1.3 (HH) 0.5 - 1.2 mmol/L Final    Comment:    A concentration of 0.5-0.8 mmol/L is advised for long-term use; concentrations of up to 1.2 mmol/L may be necessary during acute treatment.                                  Detection Limit = 0.1                           <0.1 indicates None Detected Patient drug level exceeds published reference range.  Evaluate clinically for  signs of potential toxicity.      Lab Results  Component Value Date   VALPROATE 79 04/20/2023     .res Assessment: Plan:    Plan:  PDMP reviewed  Continue:   Depakote  1000mg  daily.  Trazadone 100mg  - taking 1/2 tablet daily.  Neuropsych evaluation completed in April.  RTC 3/4 weeks   25 minutes spent dedicated to the care of this patient on the date of this encounter to include pre-visit review of records, ordering of medication, post visit documentation, and face-to-face time with the patient discussing Bipolar Disorder, GAD and insomnia. Discussed continuing current medication regimen.  Patient totally disabled an unable to work with current disabilities - mental health issues Bipolar disorder and recently diagnosed with Parkinson's with Lewy Body Dementia.   Discussed potential metabolic side effects associated with atypical antipsychotics, as well as potential risk for movement side effects. Advised pt to contact office if movement side effects occur.    Patient advised to contact office with any questions, adverse effects, or acute worsening in signs and symptoms.  Diagnoses and all orders for this visit:  Bipolar I disorder (HCC)  Generalized anxiety disorder  Insomnia, unspecified type  High risk medication use -     Valproic acid  level     Please see After Visit Summary for patient specific instructions.  Future Appointments  Date Time Provider Department Center  12/11/2023 10:15 AM Kaylene Lauraine HERO, OT [facility]  12/11/2023 11:00 AM Halpin, Mason K, PT [facility]  12/13/2023 10:15 AM Kaylene Lauraine HERO, OT [facility]  12/13/2023 11:00 AM Starlet Greig ORN, PT [facility]  12/19/2023  9:30 AM Kaylene Lauraine HERO, OT [facility]  12/21/2023  8:00 AM Kaylene Lauraine HERO, OT [facility]  12/21/2023 11:10 AM Zollie Lowers, MD [facility] None  01/08/2024 10:30  AM Dohmeier, Dedra, MD [facility] None    Orders Placed This Encounter  Procedures   Valproic acid   level    -------------------------------

## 2023-12-08 ENCOUNTER — Other Ambulatory Visit

## 2023-12-10 LAB — VALPROIC ACID LEVEL: Valproic Acid Lvl: 53 ug/mL (ref 50–100)

## 2023-12-11 ENCOUNTER — Ambulatory Visit

## 2023-12-11 ENCOUNTER — Ambulatory Visit: Admitting: Occupational Therapy

## 2023-12-11 DIAGNOSIS — R278 Other lack of coordination: Secondary | ICD-10-CM

## 2023-12-11 DIAGNOSIS — R2681 Unsteadiness on feet: Secondary | ICD-10-CM

## 2023-12-11 DIAGNOSIS — R2689 Other abnormalities of gait and mobility: Secondary | ICD-10-CM

## 2023-12-11 DIAGNOSIS — R293 Abnormal posture: Secondary | ICD-10-CM

## 2023-12-11 DIAGNOSIS — R29818 Other symptoms and signs involving the nervous system: Secondary | ICD-10-CM | POA: Diagnosis not present

## 2023-12-11 NOTE — Therapy (Signed)
 OUTPATIENT [facility]ATIONAL [facility]ERAPY [facility]  Treatment Note  Patient Name: Melvin Roberts MRN: [id number] DOB:06-Dec-1960, 63 y.o., male Today's Date: 12/11/2023  PCP: Zollie Lowers, MD REFERRING PROVIDER: Dohmeier, Dedra, MD  END OF SESSION:  OT End of Session - 12/11/23 1023     Visit Number 7    Number of Visits 13    Date for OT Re-Evaluation 12/15/23    Authorization Type [employer] [id number]    OT Start Time 1020    OT Stop Time 1100    OT Time Calculation (min) 40 min    Activity Tolerance Patient tolerated treatment well    Behavior During Therapy WFL for tasks assessed/performed                Past Medical History:  Diagnosis Date   Allergy    Bipolar 1 disorder (HCC)    GERD (gastroesophageal reflux disease)    Hyperlipidemia    Hypertension    Past Surgical History:  Procedure Laterality Date   BACK SURGERY  11/17/1995   Dr. Duwayne   COLONOSCOPY     EYE SURGERY     HERNIA REPAIR  04/19/2007   umbilical   UPPER GASTROINTESTINAL ENDOSCOPY  x2   w/ esophageal dilation   Patient Active Problem List   Diagnosis Date Noted   Tremor of both hands 04/20/2023   History of bipolar disorder 04/20/2023   Short-term memory loss 04/20/2023   Cognitive attention deficit 04/20/2023   Ataxia involving legs 04/20/2023   Upper respiratory tract infection 08/17/2020   Vitamin D  deficiency 04/15/2019   BMI 32.0-32.9,adult 04/15/2019   Hypertension 09/16/2013   Bipolar disorder (HCC) 03/11/2013   Allergic rhinitis 03/11/2013   BPH (benign prostatic hyperplasia) 03/11/2013   Hyperlipidemia 05/29/2007   Depression, recurrent (HCC) 05/29/2007   Sinusitis, chronic 05/29/2007   ESOPHAGEAL STRICTURE 05/29/2007   GERD 05/29/2007   HIATAL HERNIA 05/29/2007   LOW BACK PAIN, CHRONIC 05/29/2007    ONSET DATE: referral date 10/02/23  REFERRING DIAG: R25.1 (ICD-10-CM) - Tremor of both hands R41.3 (ICD-10-CM) - Short-term memory loss Z86.59 (ICD-10-CM) - History of  bipolar disorder F33.9 (ICD-10-CM) - Depression, recurrent  THERAPY DIAG:  Other symptoms and signs involving the nervous system  Other lack of coordination  Other abnormalities of gait and mobility  Rationale for Evaluation and Treatment: Rehabilitation  SUBJECTIVE:   SUBJECTIVE STATEMENT: Pt reports that his right leg was bothering him a bit last night but that it's okay this morning.   Pt accompanied by: self and significant other (spouse [name], dropped off)  PERTINENT HISTORY: parkinsonism, bipolar disorder, HTN, hyperlipidemia  PRECAUTIONS: Fall  WEIGHT BEARING RESTRICTIONS: No  PAIN:  Are you having pain? No just stiff  FALLS: Has patient fallen in last 6 months? Yes. Number of falls 6-8, but less since using Rollator end of May  LIVING ENVIRONMENT: Lives with: lives with their spouse Lives in: Mobile home Stairs: Yes: External: 4-5 steps; can reach both Has following equipment at home: Single point cane, Walker - 4 wheeled, shower chair, and bed assist rail  PLOF: Independent and Independent with basic ADLs  PATIENT GOALS: better balance to walk better  OBJECTIVE:  Note: Objective measures were completed at Evaluation unless otherwise noted.  HAND DOMINANCE: Right  ADLs: Overall ADLs: increased time for self-care tasks, about twice as long as before  Transfers/ambulation related to ADLs: Eating: increased spillage when eating and carrying cup, uses larger spoon Grooming: Mod I UB Dressing: Mod I LB Dressing: occasional difficulty  with tying shoes, is now wearing [employer] slip ins, increased time with donning socks Toileting: Mod I Bathing: Mod I Tub Shower transfers: Mod I Equipment: Shower seat with back and Walk in shower  IADLs: Light housekeeping: used to load and unload dishwasher but has not been feeling up to it Meal Prep: will occasionally make a sandwich Community mobility: not driving as pt would veer to right when driving Medication  management: spouse fills pill box Handwriting: 75% legible, Mild micrographia, and PPT#1 handwriting (whales live in [place]): 17.87 seconds. Pt writing in all caps when writing in print.  Mild micrographia, but remained same size from start to finish.  MOBILITY STATUS: wide shuffling gait, he has noted some freezing - as if he has to tell his body to move and its doesn't do what he wants.  POSTURE COMMENTS:  Mild Right lean  ACTIVITY TOLERANCE: Activity tolerance: diminished  FUNCTIONAL OUTCOME MEASURES: Physical performance test: PPT#2 (simulated eating) 14.32 sec - 1 drop  COORDINATION: Finger Nose Finger test: tremors noted bilaterally in fingers and forearm, however not impacting motor control.  Slower overall bilaterally 9 Hole Peg test: Right: 30.81 sec; Left: 39.18 sec Box and Blocks:  Right 52 blocks, Left 50 blocks RAM: decreased quality of movement, requiring tactile cues of thighs  UE ROM:  WFL, B shoulder limited ~140-150*  UE MMT:   WFL  SENSATION: Reports occasional tingling in fingers on L hand  COGNITION: Overall cognitive status: Within functional limits for tasks assessed and pt reports memory is not as good as it was before, also difficulty with dual tasking  OBSERVATIONS: Bradykinesia                                                                                                                    TREATMENT DATE:  12/11/23 Large amplitude: engaged in hand opening, finger flicks, and forearm supination/pronation with focus on large amplitude.  Pt demonstrating good hand opening and finger flicks, demonstrating decreased supination.   Coordination: engaged in picking up variety of coins with RUE and LUE with focus on large amplitude, purposeful movements.  Pt demonstrating good carryover of hand opening throughout as needed.   Self-feeding: engaged in simulated self-feeding with scooping beans from bowl with spoon.  Pt demonstrating good hand placement and  reports of increased awareness of hand and body positioning during self-feeding to increase success.  Pt completing PPT #2: in 12.72 sec Gross motor: engaged in cup stacking in pyramid setup in standing with focus on amplitude, upright posture, and weight shifting.  Pt with good upright standing posture, still with tendency to have smaller movements, knocking over cups x1. Bag exercises: reviewed simulated LB dressing with scarf.  Pt still with difficulty lifting legs from floor to advance scarf over feet to simulate donning pants.  OT educated pt on modifications - modified circle sitting, figure 4 position, and use of block/step stool to increase reach.  Pt unable to achieve figure 4 position with RLE but with improved  ease with placing R foot on block.  Pt then with improved ease with advance scarf under foot.  Encouraged pt to trial various positions to increase ease with LB dressing.    12/06/23 Large amplitude: engaged in reaching outside BOS, across midline, and to high and low targets with focus on trunk rotation and weight shifting.  OT providing demonstration, close supervision, and cues for sequencing and amplitude.  Placed Rollator in front of pt to allow for UE support as needed, used intermittently especially when crossing midline or reaching to lower targets. OT increased challenge to naming items based on color of target to incorporate cognition.  Pt with increased difficulty with cognitive/motor dual tasking.   Coordination: w/ each UE including: flipping cards one at a time off a deck.  OT instructing pt in large amplitude coordination tasks with focus on big open hand prior to picking up and releasing items to challenge tendency of movements to get smaller.   Pt with good carryover of opening hand intermittently throughout task. Box and blocks: R: 58 blocks  (pt hitting barrier x5-6) and L: 64.  OT reiterating education on large amplitude, especially as pt demonstrating decreased amplitude  with RUE as task continued.      12/04/23 Bag exercises: Simulated ADLs for the following activities (using a plastic bag/scarf): donning shirt, drying back, pulling down shirt, and donning pants.  OT providing verbal cues and demonstration with focus on large amplitude movements and sequencing.  Pt with difficulty with picking up lower leg for simulated donning of pants.  Therefore OT modified task to lifting at hip to pick foot from ground while advancing bag under thigh.  Pt will continue to benefit from focus on amplitude to carry over to aiding in ADLs of bathing/dressing.     PATIENT EDUCATION: Education details: large amplitude movements , modifications to positioning for LB dressing Person educated: Patient and Spouse Education method: Explanation, Demonstration, Verbal cues, and Handouts Education comprehension: verbalized understanding and needs further education  HOME EXERCISE PROGRAM: PWR hands (see pt instructions)  GOALS: Goals reviewed with patient? Yes  SHORT TERM GOALS: Target date: 11/10/23  Pt will be Independent with PD specific HEP Baseline: new to OPOT Goal status: in progress  2.  Pt will verbalize understanding of task modifications, adapted strategies, and/or potential DME/AE needs to increase ease, safety, and independence w/ ADLs/IADLs. Baseline: not engaging in IADLs at previous level, and increased time for ADLs Goal status: in progress  3.  Pt will demonstrate improved fine motor coordination for ADLs as evidenced by decreasing 9 hole peg test score for RUE/LUE by 5 secs Baseline: 9 Hole Peg test: Right: 30.81 sec; Left: 39.18 sec 11/17/23: Right: 26.63 sec, Left: 31.72 sec Goal status: Partially met - Met on Left    LONG TERM GOALS: Target date: 12/15/23  Pt will demonstrate improved ease with feeding as evidenced by decreasing PPT#2 (self feeding) by 3 secs Baseline: 14.32 sec 12/11/23: 12.72 sec Goal status: in progress  2.  Pt will demonstrate  improved UE functional use for ADLs as evidenced by increasing box/ blocks score by 5 blocks with RUE/LUE Baseline: R: 52 and L: 50 12/06/23: R: 58 blocks  (pt hitting barrier x5-6) and L: 64 Goal status: MET  3.  Pt will demonstrate understanding of memory compensations and ways to keep thinking skills sharp Baseline: impaired memory Goal status: in progress  4.  Pt will demonstrate ability to walk 20' while carrying cup without any spillage. Baseline:  spilling cup of water with ambulation Goal status: in progress  ASSESSMENT:  CLINICAL IMPRESSION: Patient is a 63 y.o. male who was seen today for occupational therapy treatment session for parkinsonism with tremors.  Pt with resting tremors in BUE (L>R), however demonstrating good understanding of large amplitude exercises and good carryover with intermittent hand opening during fine motor tasks.  Pt still with difficulty with LB dressing, however receptive to modifications provided this session.    PERFORMANCE DEFICITS: in functional skills including ADLs, IADLs, coordination, dexterity, sensation, ROM, strength, Fine motor control, Gross motor control, balance, body mechanics, endurance, decreased knowledge of precautions, decreased knowledge of use of DME, and UE functional use, cognitive skills including memory and safety awareness, and psychosocial skills including coping strategies, environmental adaptation, and routines and behaviors.   IMPAIRMENTS: are limiting patient from ADLs, IADLs, and leisure.     PLAN:  OT FREQUENCY: 1-2x/week  OT DURATION: 6 weeks (asking 8 weeks for scheduling)  PLANNED INTERVENTIONS: 02831 OT Re-evaluation, 97535 self care/ADL training, 02889 therapeutic exercise, 97530 therapeutic activity, 97112 neuromuscular re-education, 97750 Physical Performance Testing, balance training, functional mobility training, psychosocial skills training, energy conservation, coping strategies training, patient/family  education, and DME and/or AE instructions  RECOMMENDED OTHER SERVICES: PT  CONSULTED AND AGREED WITH PLAN OF CARE: Patient and family member/caregiver  PLAN FOR NEXT SESSION:  REVIEW GOALS and discuss d/c vs re-cert - have only used 7 of 13 visits  initiate PD HEP with focus on large amplitude movements - bag exercises - review LB dressing and possible modifications, review coordination HEP, educate on memory/thinking skills, attempt dual tasking with seated progressing to standing and ambulating   Mattalynn Crandle, OTR/L 12/11/2023, 10:23 AM  [facility] [address], [address] [place], [place] [address] Phone # [phone] Fax # [phone]

## 2023-12-11 NOTE — Therapy (Signed)
 OUTPATIENT PHYSICAL THERAPY NEURO TREATMENT  Patient Name: Melvin Roberts MRN: [id number] DOB:1960-07-05, 63 y.o., male Today's Date: 12/11/2023   PCP: [name], MD REFERRING PROVIDER: [name], MD   END OF SESSION:  PT End of Session - 12/11/23 1107     Visit Number 14    Number of Visits 16    Date for PT Re-Evaluation 12/18/23    Authorization Type aetna; no VL    PT Start Time 1100    PT Stop Time 1145    PT Time Calculation (min) 45 min    Equipment Utilized During Treatment Gait belt    Activity Tolerance Patient tolerated treatment well    Behavior During Therapy WFL for tasks assessed/performed              Past Medical History:  Diagnosis Date   Allergy    Bipolar 1 disorder (HCC)    GERD (gastroesophageal reflux disease)    Hyperlipidemia    Hypertension    Past Surgical History:  Procedure Laterality Date   BACK SURGERY  11/17/1995   Dr. Duwayne   COLONOSCOPY     EYE SURGERY     HERNIA REPAIR  04/19/2007   umbilical   UPPER GASTROINTESTINAL ENDOSCOPY  x2   w/ esophageal dilation   Patient Active Problem List   Diagnosis Date Noted   Tremor of both hands 04/20/2023   History of bipolar disorder 04/20/2023   Short-term memory loss 04/20/2023   Cognitive attention deficit 04/20/2023   Ataxia involving legs 04/20/2023   Upper respiratory tract infection 08/17/2020   Vitamin D  deficiency 04/15/2019   BMI 32.0-32.9,adult 04/15/2019   Hypertension 09/16/2013   Bipolar disorder (HCC) 03/11/2013   Allergic rhinitis 03/11/2013   BPH (benign prostatic hyperplasia) 03/11/2013   Hyperlipidemia 05/29/2007   Depression, recurrent (HCC) 05/29/2007   Sinusitis, chronic 05/29/2007   ESOPHAGEAL STRICTURE 05/29/2007   GERD 05/29/2007   HIATAL HERNIA 05/29/2007   LOW BACK PAIN, CHRONIC 05/29/2007    ONSET DATE: 10/02/23  REFERRING DIAG:  R25.1 (ICD-10-CM) - Tremor of both hands  R41.3 (ICD-10-CM) - Short-term memory loss  Z86.59  (ICD-10-CM) - History of bipolar disorder  F33.9 (ICD-10-CM) - Depression, recurrent (HCC    THERAPY DIAG:  Other symptoms and signs involving the nervous system  Other abnormalities of gait and mobility  Abnormal posture  Unsteadiness on feet  Rationale for Evaluation and Treatment: Rehabilitation  SUBJECTIVE:  SUBJECTIVE STATEMENT: Aggravated the right knee a little playing with the dogs  Pt accompanied by: self (spouse: [name])  PERTINENT HISTORY: parkinsonism, bipolar disorder, HTN, hyperlipidemia, knee surgeries 08/2022  PAIN:  Are you having pain? Yes: NPRS scale: I'm just a little bit sore but not bad Pain location: low back, knees Pain description: varies Aggravating factors: weather Relieving factors: varies  PRECAUTIONS: Fall  WEIGHT BEARING RESTRICTIONS: No  FALLS: Has patient fallen in last 6 months? Yes. Number of falls 2, in the last month. He was falling daily until getting the rollator RW in the past few months.  LIVING ENVIRONMENT: Lives with: lives with their spouse Lives in: House/apartment Stairs: Yes: External: 5 steps; can uses a rail-- he doesn't get his foot on the step well and going up he catches his foot Has following equipment at home: Walker - 4 wheeled  PLOF: Independent , declining in past year  PATIENT GOALS: walk more steady (without device would be nice)  OBJECTIVE:    TODAY'S TREATMENT: 12/11/23 Activity Comments  NU-step level 5 x 8 min Steady state 70-80 SPM  Gait training -trials w/ U-step w/ visual cues for step length--minimal difference between rollator -forwards/backwards x 2 min // bars -Forwards/lateral Bosu lunge w/ big arms -forwards/lateral over 6 hurdles  Balance  -static multisensory balance -4-square step 6 hurdles -tandem  walk w/ UE support               TODAY'S TREATMENT: 12/06/23 Activity Comments  Nustep L5 x 6 min UEs/LEs  For neural priming, last minute all out:  standing PWR rock targets to initiate wt shifting for turns Reaching to therapy poles. Cueing for LE wt shift rather than excessive UE reach  Short distance gait to targets in gym without AD Focus on lateral wt shift, then large confident step (indicating # of steps to target) and walking around therapy pole to practice turns     PATIENT EDUCATION: Education details: provided edu on PD freeze tips and added notes on: STOP, SIGH, SHIFT, STEP; discussed pt's plans to participate in PD spin class, edu on PD community resources and [place] PWR class, remainder of POC Person educated: Patient Education method: Explanation, Demonstration, Tactile cues, Verbal cues, and Handouts Education comprehension: verbalized understanding and returned demonstration    HOME EXERCISE PROGRAM Last updated: 10/23/23 Sitting PWR moves 2x10 each/day     Note: Objective measures were completed at Evaluation unless otherwise noted. DIAGNOSTIC FINDINGS: DAT scan, NPH assessment  COGNITION: Overall cognitive status: Impaired; he started aricept , also notes hearing declined significantly in last year   SENSATION: L hand  COORDINATION: RAM mildly ataxic, symmetrical with FTN and opposition  MUSCLE LENGTH: Hamstrings: tightness noted bilaterally  POSTURE: No Significant postural limitations  LOWER EXTREMITY ROM:    WFLs  LOWER EXTREMITY MMT:   MMT Right Eval Left Eval  Hip flexion 5/5 5/5  Hip extension    Hip abduction    Hip adduction    Hip internal rotation    Hip external rotation    Knee flexion 5/5 5/5  Knee extension 5/5 5/5  Ankle dorsiflexion 4-/5 5/5  Ankle plantarflexion    Ankle inversion    Ankle eversion    (Blank rows = not tested)  BED MOBILITY:  Findings: Sit to supine Complete Independence  TRANSFERS: Sit to  stand: Complete Independence  Assistive device utilized: to rollator RW      STAIRS: TBA-- has 5 at home  GAIT: Findings: Gait Characteristics: decreased step  length- Right, decreased step length- Left, decreased stride length, decreased ankle dorsiflexion- Right, decreased ankle dorsiflexion- Left, shuffling, poor foot clearance- Right, and poor foot clearance- Left, Distance walked: 100 ft, Assistive device utilized:Walker - 4 wheeled and None, Level of assistance: SBA, and Comments: Patient ambulates with frequent festinating patterns and reports at times freezing for 1 minute in the home Gait speed=1.28 ft/sec without device Gait speed=1.76 ft/sec with rollator  FUNCTIONAL TESTS:  5 times sit to stand: 22.92 Timed up and go (TUG): 25.22, TUG cognitive is 33.60 seconds with diminished accuracy in counting Berg Balance Scale:  Item Test date: 10/19/23 Test date: 11/27/23 Test date:   Sitting to standing 4. able to stand without using hands and stabilize independently 4. able to stand without using hands and stabilize independently  4. able to stand safely for 2 minutes  4. able to sit safely and securely for 2 minutes   4. sits safely with minimal use of hands  4. able to transfer safely with minor use of hands  4. able to stand 10 seconds safely  4. able to place feet together independently and stand 1 minute safely  4. can reach forward confidently 25 cm (10 inches)    4. able to pick up slipper safely and easily  3. looks behind one side only, other side shows less weight shift    2. able to turn 360 degrees safely but slowly  4. ble to stand independently and safely and complete 8 steps in 20 seconds    3. able to place foot ahead independently and hold 30 seconds   2. able to lift leg independently and hold >= 3 seconds     Insert OPRCBERGREEVAL SmartPhrase at re-test date  2. Standing unsupported 4. able to stand safely for 2 minutes    3. Sitting with back unsupported,  feet supported 4. able to sit safely and securely for 2 minutes    4. Standing to sitting 4. sits safely with minimal use of hands    5. Pivot transfer  4. able to transfer safely with minor use of hands    6. Standing unsupported with eyes closed 4. able to stand 10 seconds safely    7. Standing unsupported with feet together 3. able to place feet together independently and stand 1 minute with supervision    8. Reaching forward with outstretched arms while standing 2. can reach forward 5 cm (2 inches)    9. Pick up object from the floor from standing 3. able to pick up slipper but needs supervision    10. Turning to look behind over left and right shoulders while standing 2. turns sideways but only maintains balance    11. Turn 360 degrees 1. needs close supervision or verbal cuing    12. Place alternate foot on step or stool while standing unsupported 0. needs assistance to keep from falling/unable to try    13. Standing unsupported one foot in front 2. able to take small step independently and hold 30 seconds    14. Standing on one leg 1. tries to lift leg unable to hold 3 seconds but remains standing independently.      Total Score 38/56 Total Score x 50/56 Total Score x/56   Turning= 16 steps to the R and 22 steps to the L  PATIENT SURVEYS:  Freezing of gait questionnaire Not performed  [facility] Adult PT Treatment:                                                DATE: 10/19/23 Gait: Discussed tips to reduce freezing and practiced in session. Tips to reduce freezing episodes with standing or walking:  Stand tall with your feet wide, so that you can rock and weight shift through your hips. Don't try to fight the freeze: if you begin taking slower, faster, smaller steps, STOP, get your posture tall, and RESET your posture and balance.  Take a deep breath before taking the BIG  step to start again. March in place, with high knee stepping, to get started walking again. Use auditory cues:  Count out loud, think of a familiar tune or song or cadence, use pocket metronome, to use rhythm to get started walking again. Use visual cues:  Use a line to step over, use laser pointer line to step over, (using BIG steps) to start walking again. Use visual targets to keep your posture tall (look ahead and focus on an object or target at eye level). As you approach where your destination with walking, count your steps out loud and/or focus on your target with your eyes until you are fully there. Use appropriate assistive device, as advised by your physical therapist to assist with taking longer, consistent steps.   PATIENT EDUCATION: Education details: Nature of PT, goals for therapy, tips to reduce freezing. Person educated: Patient and Spouse Education method: Explanation, Demonstration, and Handouts Education comprehension: verbalized understanding and returned demonstration  HOME EXERCISE PROGRAM: None established-- provided tips for freezing  GOALS: Goals reviewed with patient? Yes  SHORT TERM GOALS: Target date: 11/18/23  The patient will perform HEP with wife's assist. Baseline: initiated at eval Goal status: MET  2.  The patient will improve gait speed to 2.5 ft/sec with rollater demonstrating dec'd festination. Baseline:  1.76 ft/sec; 2.11 ft/sec Goal status: NOT MET 11/27/23  3.   The patient will improve Berg to > or equal to 42/56.  Baseline:  38/56; 50/56 Goal status: MET  4.  The patient will move floor<>stand with UE support due to h/o falls. Baseline:  PT to further assess; independent Goal status: MET  LONG TERM GOALS: Target date: 12/19/23  The patient will have progression of HEP with wife's assist Baseline:  initiated at eval Goal status: IN PROGRESS  2.  The patient will improve freezing of gait by 5 points to demonstrate improved functional  abilities. Baseline: 17/24 10/23/23 Goal status: IN PROGRESS  3.  The patient will improve Berg to > or equal to 46/56. Baseline:  38/56; 50/56 Goal status: MET  4.  The patient will improve TUG score < or equal to 20 seconds. Baseline:  25.22 seconds Goal status: IN PROGRESS  5.  The patient will improve 5 time sit<>stand to < 18 seconds. Baseline: 22.92 seconds Goal status: IN PROGRESS  6.  The patient will report reduction in falls to < or equal to 2x/month. Baseline:  varies-- has been as much as daily to 2x/month this year Goal status: IN PROGRESS  ASSESSMENT:  CLINICAL IMPRESSION: Activities to facilitate balance and coordination to improve unsupported standing and manuevering to improve safety with ADL and household ambulation.  Gait training w/ U-step for visual cue without much significant difference between his adapted rollator and device in  regards to foot clearance and step length. Good carryover to visual cues for step height for clearance of obstacles but minimal carryover beyond 1-2 minutes of walking level surfaces with resumption of shuffling.  Continued sessions to refine HEP and community-based activities in preparation for D/C  OBJECTIVE IMPAIRMENTS: Abnormal gait, decreased balance, decreased coordination, decreased knowledge of use of DME, decreased mobility, difficulty walking, increased fascial restrictions, impaired flexibility, postural dysfunction, and pain.   ACTIVITY LIMITATIONS: carrying, lifting, bending, standing, transfers, bed mobility, and locomotion level  PARTICIPATION LIMITATIONS: cleaning, personal finances, driving, community activity, and occupation  PERSONAL FACTORS: 3+ comorbidities: see above are also affecting patient's functional outcome.   REHAB POTENTIAL: Good  CLINICAL DECISION MAKING: Unstable/unpredictable  EVALUATION COMPLEXITY: High  PLAN:  PT FREQUENCY: 2x/week  PT DURATION: 8 weeks  PLANNED INTERVENTIONS: 97164- PT  Re-evaluation, 97750- Physical Performance Testing, 97110-Therapeutic exercises, 97530- Therapeutic activity, [id number]- Neuromuscular re-education, 97535- Self Care, [id number]- Manual therapy, [phone]- Gait training, Patient/Family education, Balance training, Stair training, and DME instructions  PLAN FOR NEXT SESSION: PWR! Exercises working initially on turns and reducing freezing during gait- may review STOP, SIGH, SHIFT, STEP.  *adjust height of walker   11:48 AM, 12/11/23 M. Kelly Malini Flemings, PT, DPT Physical Therapist- [place] Office Number: [phone]

## 2023-12-12 ENCOUNTER — Encounter: Payer: Self-pay | Admitting: Psychology

## 2023-12-12 NOTE — Progress Notes (Signed)
 Neuropsychological Consultation   Patient:   Melvin Roberts   DOB:   Apr 13, 1961  MR Number:  [id number]  Location:  [facility] FOR PAIN AND REH[facility]IL[facility]IVE MEDIC[facility] PHYSICAL MEDICINE AND REHABILITATION [address] [place], [address] [place] [address] Dept: [phone]           Date of Service:   08/10/2023  Location of Service and Individuals present: Today's visit was conducted in outpatient clinic office with the patient, his wife and myself present.  Start Time:   3 PM End Time:   4 PM  Today's visit included a 1 hour and 20-minute face-to-face clinical interview as well as 40 minutes spent with records review, report writing and setting up testing protocols.  Patient Consent and Confidentiality: Limits of confidentiality were reviewed and the fact that the patient was being referred for neuropsychological evaluation which would include formal report and communication back to his referring physician and report being in the patient's electronic medical records were reviewed and patient consents to proceed.  Consent for Evaluation and Treatment:  Signed:  Yes Explanation of Privacy Policies:  Signed:  Yes Discussion of Confidentiality Limits:  Yes  Provider/Observer:  Norleen Asa, Psy.D.       Clinical Neuropsychologist       Billing Code/Service: 96116/96121  Chief Complaint:     Chief Complaint  Patient presents with   Gait Problem    Slowed information processing, confusion, tremors   Memory Loss    Reason for Service:    Melvin Roberts is a 63 year old male is referred by his neurologist, [name], MD, for a neuropsychological consultation due to progressively worsening cognitive difficulties and motor changes. He presents with increasing difficulties in both long-term and short-term memory, episodes of acute confusion, bilateral tremors, and gait disturbance. Functionally, he has experienced a lean to the right and  right-sided tracking while walking, with balance remaining problematic. He has also had a rightward drift when driving, which has led him to cease driving. Cueing has some impact on memory. He has experienced episodes of confusion with understanding situations and gets easily confused with some episodic worsening but a progressive worsening overall. Past medical history includes bipolar affective disorder.  His primary care physician initiated Parkinson's medications with some reported benefit. Aricept  was also trialed without significant side effects. There is no family history of progressive degenerative conditions. He has experienced freezing episodes, described as his body not responding to commands to move. He has a history of obstructive sleep apnea but was evaluated by Dr. Bland and found not to have the condition.  Medical History:   Past Medical History:  Diagnosis Date   Allergy    Bipolar 1 disorder (HCC)    GERD (gastroesophageal reflux disease)    Hyperlipidemia    Hypertension          Patient Active Problem List   Diagnosis Date Noted   Tremor of both hands 04/20/2023   History of bipolar disorder 04/20/2023   Short-term memory loss 04/20/2023   Cognitive attention deficit 04/20/2023   Ataxia involving legs 04/20/2023   Upper respiratory tract infection 08/17/2020   Vitamin D  deficiency 04/15/2019   BMI 32.0-32.9,adult 04/15/2019   Hypertension 09/16/2013   Bipolar disorder (HCC) 03/11/2013   Allergic rhinitis 03/11/2013   BPH (benign prostatic hyperplasia) 03/11/2013   Hyperlipidemia 05/29/2007   Depression, recurrent (HCC) 05/29/2007   Sinusitis, chronic 05/29/2007   ESOPHAGEAL STRICTURE 05/29/2007   GERD 05/29/2007   HIATAL  HERNIA 05/29/2007   LOW BACK PAIN, CHRONIC 05/29/2007    Onset and Duration of Symptoms:  Symptoms were first noticed by the individual after a knee replacement surgery in May of 2023, with progressive worsening of short-term memory and  acute confusion. His wife, however, reports an earlier onset in 2022, when she first noticed periods of confusion and his need to write everything down. The tremor in his right hand developed while he was still working. He stopped working in January of 2023.  Progression of Symptoms:  Symptoms have been slowly progressive. Tremors that began in the right hand have now progressed to involve both hands. Gait has worsened, with increased shuffling, a forward lean, and a directional change to the right. Balance issues are significant, with a fall or near-fall occurring almost daily. Mood has been reported as stable with current psychiatric interventions.  Additional Tests and Measures from other records:  Neuroimaging Results:  A brain DaTscan  was within normal limits, with no indication of reduced radiotracer activity in the basal ganglia to suggest Parkinson's syndrome pathology. A brain MRI in January of 2025 was unremarkable, showing minimal, age-appropriate chronic small vessel disease with no structural lesions, tumors, or acute process. A CT of the head in May of 2025 identified generalized atrophy and the previously noted microvascular disease, with no acute abnormalities.   Laboratory Tests:  Lab work has shown valproic acid  to be at target range. A CSF culture was negative. General blood work has been within normal limits. An ATN profile in January of 2025 showed a high p-tau 181 concentration but normal beta-amyloid 42/40 ratio and normal NFL concentration, results not associated with an increased risk for Alzheimer's type pathology   Sleep:  He has experienced some difficulty sleeping, primarily when symptoms first began. He generally sleeps well through the night currently.  Behavioral Observation/Mental Status:   The patient is a 63 year old male presents for neuropsychological consultation. He has difficulties with walking, gait disturbance, hand shaking primarily in his right dominant hand,  and episodes of confusion. His wife describes a slow, progressive change. He is experiencing increasing difficulties with executive functioning, including problem-solving and decision-making. He has had some geographic disorientation, though this appears related to reasoning and problem-solving difficulties, such as needing to call his wife for directions. Short-term memory is a primary issue, with both semantic and episodic memory difficulties reported. He has had difficulty with sequencing appointments and activities. Mood is reported as stable.  Marital Status/Living:  He was born in [address], [place] , and has two siblings. His mother had a normal pregnancy and delivery. Childhood illnesses included chickenpox. He is right-handed. He currently lives with his wife of 36 years. He has never been married previously. He has two 61 year old twin sons; one is under [place] treatment for significant PTSD resulting from combat deployment to [place], and the other also suffers from some PTSD-like symptoms from his service in [place].  Educational and Occupational History:     Highest Level of Education:  He completed high school with a 3.8 GPA and attended technical school.  He did well in science classes with relative weakness in math. He participated in the school's debate team and chorus. He received training in aeronautical maintenance.  Current Occupation:    The patient is retired due to his cognitive deficits.  Work History:   He worked as an Retail banker for 40 years, initially for [employer] and later for [employer]. He stopped working due to long-term disability related to physical  difficulties, including problems with his right knee, and the onset of tremors and cognitive difficulties.  Hobbies and Interests:  His hobbies include bird watching, hiking, and boating.   Psychiatric History:  The patient was first diagnosed with bipolar affective disorder at age 21. He has a history of taking  lithium  and Depakote  and continues on Depakote . He is followed by psychiatry. There is a family history of bipolar affective disorder in his maternal grandmother and bipolar depression in his father.  History of Substance Use or Abuse:  No concerns of substance abuse are reported.    Family Med/Psych History:  Family History  Problem Relation Age of Onset   Cancer Mother        breast   Cancer Father        lug   Esophageal cancer Maternal Grandfather    Cancer Maternal Grandfather    Cancer Paternal Grandfather    Colon cancer Neg Hx    Colon polyps Neg Hx    Rectal cancer Neg Hx    Stomach cancer Neg Hx     Impression/DX:   SAMIE REASONS is a 63 year old male referred by his neurologist, [name], MD, for neuropsychological consultation due to progressively worsening cognitive and motor changes. Primary complaints include difficulties with short-term memory, episodes of confusion, bilateral tremors, and gait disturbance with a rightward lean and track. Symptoms reportedly began after a knee surgery in May of 2023, though his wife notes an earlier onset of confusion in 2022. Functional difficulties include problems with problem-solving, decision-making, and sequencing, leading to his retirement. He has stopped driving due to a rightward drift. His symptoms have been slowly progressive.  While the patient is still being worked up one of the potential etiological factors considered is normal pressure hydrocephalus.  Disposition/Plan:  Further evaluation will be scheduled. This will include assessments such as the Wechsler Adult Intelligence Scale, Wechsler Memory Scale, and controlled oral word association test. A formal report will be generated and made available in the EMR, with copies provided to the referring physician and the neurology team.  Diagnosis:    Cognitive and neurobehavioral dysfunction  Short-term memory loss  Cognitive attention deficit  Bipolar affective  disorder, remission status unspecified (HCC)        Note: This document was prepared using Dragon voice recognition software and may include unintentional dictation errors.   Electronically Signed   _______________________ Norleen Asa, Psy.D. Clinical Neuropsychologist

## 2023-12-13 ENCOUNTER — Ambulatory Visit: Admitting: Occupational Therapy

## 2023-12-13 ENCOUNTER — Ambulatory Visit: Admitting: Physical Therapy

## 2023-12-13 ENCOUNTER — Encounter: Payer: Self-pay | Admitting: Physical Therapy

## 2023-12-13 DIAGNOSIS — R2689 Other abnormalities of gait and mobility: Secondary | ICD-10-CM

## 2023-12-13 DIAGNOSIS — R29818 Other symptoms and signs involving the nervous system: Secondary | ICD-10-CM

## 2023-12-13 DIAGNOSIS — R278 Other lack of coordination: Secondary | ICD-10-CM

## 2023-12-13 DIAGNOSIS — R2681 Unsteadiness on feet: Secondary | ICD-10-CM

## 2023-12-13 NOTE — Therapy (Signed)
 OUTPATIENT OCCUPATIONAL THERAPY [facility]  Treatment Note & Discharge  Patient Name: Melvin Roberts MRN: [id number] DOB:04-27-1960, 63 y.o., male Today's Date: 12/13/2023  PCP: Zollie Lowers, MD REFERRING PROVIDER: Dohmeier, Dedra, MD  OCCUPATIONAL THERAPY DISCHARGE SUMMARY  Visits from Start of Care: 8  Current functional level related to goals / functional outcomes: Pt demonstrating improvements in objective measurements with fine and gross motor control tasks.  Pt and spouse reporting improvements in motor control and ease of movement with ADLs.  Pt reports recollection of cues for purposeful grasp on cup and utensils during self-feeding.   Remaining deficits: tremors   Education / Equipment: Large amplitude exercises, modifications to setup and/or hand positioning with self-feeding and carrying items, memory strategies   Patient agrees to discharge. Patient goals were met. Patient is being discharged due to meeting the stated rehab goals. Pt to return to PD screens in 6-8 months.     END OF SESSION:  OT End of Session - 12/13/23 1037     Visit Number 8    Number of Visits 13    Date for OT Re-Evaluation 12/15/23    Authorization Type [employer] [id number]    OT Start Time 1020    OT Stop Time 1100    OT Time Calculation (min) 40 min    Activity Tolerance Patient tolerated treatment well    Behavior During Therapy WFL for tasks assessed/performed                 Past Medical History:  Diagnosis Date   Allergy    Bipolar 1 disorder (HCC)    GERD (gastroesophageal reflux disease)    Hyperlipidemia    Hypertension    Past Surgical History:  Procedure Laterality Date   BACK SURGERY  11/17/1995   Dr. Duwayne   COLONOSCOPY     EYE SURGERY     HERNIA REPAIR  04/19/2007   umbilical   UPPER GASTROINTESTINAL ENDOSCOPY  x2   w/ esophageal dilation   Patient Active Problem List   Diagnosis Date Noted   Tremor of both hands 04/20/2023   History of bipolar  disorder 04/20/2023   Short-term memory loss 04/20/2023   Cognitive attention deficit 04/20/2023   Ataxia involving legs 04/20/2023   Upper respiratory tract infection 08/17/2020   Vitamin D  deficiency 04/15/2019   BMI 32.0-32.9,adult 04/15/2019   Hypertension 09/16/2013   Bipolar disorder (HCC) 03/11/2013   Allergic rhinitis 03/11/2013   BPH (benign prostatic hyperplasia) 03/11/2013   Hyperlipidemia 05/29/2007   Depression, recurrent (HCC) 05/29/2007   Sinusitis, chronic 05/29/2007   ESOPHAGEAL STRICTURE 05/29/2007   GERD 05/29/2007   HIATAL HERNIA 05/29/2007   LOW BACK PAIN, CHRONIC 05/29/2007    ONSET DATE: referral date 10/02/23  REFERRING DIAG: R25.1 (ICD-10-CM) - Tremor of both hands R41.3 (ICD-10-CM) - Short-term memory loss Z86.59 (ICD-10-CM) - History of bipolar disorder F33.9 (ICD-10-CM) - Depression, recurrent  THERAPY DIAG:  Other symptoms and signs involving the nervous system  Other lack of coordination  Unsteadiness on feet  Rationale for Evaluation and Treatment: Rehabilitation  SUBJECTIVE:   SUBJECTIVE STATEMENT: Pt reports that he put his right foot in his pants first the last couple days and has noticed increased ease and improved timing of task - reporting ~15 seconds faster with LB dressing.    Pt accompanied by: self and significant other (spouse [name], dropped off)  PERTINENT HISTORY: parkinsonism, bipolar disorder, HTN, hyperlipidemia  PRECAUTIONS: Fall  WEIGHT BEARING RESTRICTIONS: No  PAIN:  Are you having  pain? No just stiff  FALLS: Has patient fallen in last 6 months? Yes. Number of falls 6-8, but less since using Rollator end of May  LIVING ENVIRONMENT: Lives with: lives with their spouse Lives in: Mobile home Stairs: Yes: External: 4-5 steps; can reach both Has following equipment at home: Single point cane, Walker - 4 wheeled, shower chair, and bed assist rail  PLOF: Independent and Independent with basic ADLs  PATIENT  GOALS: better balance to walk better  OBJECTIVE:  Note: Objective measures were completed at Evaluation unless otherwise noted.  HAND DOMINANCE: Right  ADLs: Overall ADLs: increased time for self-care tasks, about twice as long as before  Transfers/ambulation related to ADLs: Eating: increased spillage when eating and carrying cup, uses larger spoon Grooming: Mod I UB Dressing: Mod I LB Dressing: occasional difficulty with tying shoes, is now wearing [employer] slip ins, increased time with donning socks Toileting: Mod I Bathing: Mod I Tub Shower transfers: Mod I Equipment: Shower seat with back and Walk in shower  IADLs: Light housekeeping: used to load and unload dishwasher but has not been feeling up to it Meal Prep: will occasionally make a sandwich Community mobility: not driving as pt would veer to right when driving Medication management: spouse fills pill box Handwriting: 75% legible, Mild micrographia, and PPT#1 handwriting (whales live in [place]): 17.87 seconds. Pt writing in all caps when writing in print.  Mild micrographia, but remained same size from start to finish.  MOBILITY STATUS: wide shuffling gait, he has noted some freezing - as if he has to tell his body to move and its doesn't do what he wants.  POSTURE COMMENTS:  Mild Right lean  ACTIVITY TOLERANCE: Activity tolerance: diminished  FUNCTIONAL OUTCOME MEASURES: Physical performance test: PPT#2 (simulated eating) 14.32 sec - 1 drop  COORDINATION: Finger Nose Finger test: tremors noted bilaterally in fingers and forearm, however not impacting motor control.  Slower overall bilaterally 9 Hole Peg test: Right: 30.81 sec; Left: 39.18 sec Box and Blocks:  Right 52 blocks, Left 50 blocks RAM: decreased quality of movement, requiring tactile cues of thighs  UE ROM:  WFL, B shoulder limited ~140-150*  UE MMT:   WFL  SENSATION: Reports occasional tingling in fingers on L hand  COGNITION: Overall  cognitive status: Within functional limits for tasks assessed and pt reports memory is not as good as it was before, also difficulty with dual tasking  OBSERVATIONS: Bradykinesia                                                                                                                    TREATMENT DATE:  12/13/23 Memory strategies: engaged in education on memory compensation strategies such as WARM, writing down information and appts, use of sticky notes, use of alarms/reminders to increase recall of appts/medications.  Pt reports that he keeps all of his exercises in Rollator to recall information and that his spouse write down a lot of information. Large amplitude: pt engaging in large amplitude  hands prior to completing coordination tasks.  OT reiterating recommendation to complete hand flicks prior to or during [facility] tasks if noticing increased difficulty or tremors impacting task. Self-feeding: completed in 11:30 with improved hand placement and control of spoon this session. 9 hole peg: Right: 23.10 sec, Left: 25.72 sec Self-care: reviewed hemi-dressing technique, to dress RLE first due to decreased R knee mobility, from previous session as pt reporting increased ease when donning RLE first.  Pt's spouse reporting noticing improvements in ease with dressing tasks since last visit. Functional mobility: engaged in walking while carrying cup with water without AD.  Pt demonstrating good ambulation and minimal shuffling of feet without AD.  OT advising pt on hand placement to increase full grasp on cup, incorporating intermittent squeeze while holding cup to increase motor control, and recommend leaving 0.5 -1 inch from top to not overfill and spill. Pt ambulating 20' x5 without spilling cup.   12/11/23 Large amplitude: engaged in hand opening, finger flicks, and forearm supination/pronation with focus on large amplitude.  Pt demonstrating good hand opening and finger flicks, demonstrating  decreased supination.   Coordination: engaged in picking up variety of coins with RUE and LUE with focus on large amplitude, purposeful movements.  Pt demonstrating good carryover of hand opening throughout as needed.   Self-feeding: engaged in simulated self-feeding with scooping beans from bowl with spoon.  Pt demonstrating good hand placement and reports of increased awareness of hand and body positioning during self-feeding to increase success.  Pt completing PPT #2: in 12.72 sec Gross motor: engaged in cup stacking in pyramid setup in standing with focus on amplitude, upright posture, and weight shifting.  Pt with good upright standing posture, still with tendency to have smaller movements, knocking over cups x1. Bag exercises: reviewed simulated LB dressing with scarf.  Pt still with difficulty lifting legs from floor to advance scarf over feet to simulate donning pants.  OT educated pt on modifications - modified circle sitting, figure 4 position, and use of block/step stool to increase reach.  Pt unable to achieve figure 4 position with RLE but with improved ease with placing R foot on block.  Pt then with improved ease with advance scarf under foot.  Encouraged pt to trial various positions to increase ease with LB dressing.    12/06/23 Large amplitude: engaged in reaching outside BOS, across midline, and to high and low targets with focus on trunk rotation and weight shifting.  OT providing demonstration, close supervision, and cues for sequencing and amplitude.  Placed Rollator in front of pt to allow for UE support as needed, used intermittently especially when crossing midline or reaching to lower targets. OT increased challenge to naming items based on color of target to incorporate cognition.  Pt with increased difficulty with cognitive/motor dual tasking.   Coordination: w/ each UE including: flipping cards one at a time off a deck.  OT instructing pt in large amplitude coordination tasks  with focus on big open hand prior to picking up and releasing items to challenge tendency of movements to get smaller.   Pt with good carryover of opening hand intermittently throughout task. Box and blocks: R: 58 blocks  (pt hitting barrier x5-6) and L: 64.  OT reiterating education on large amplitude, especially as pt demonstrating decreased amplitude with RUE as task continued.      PATIENT EDUCATION: Education details: large amplitude movements , modifications to positioning for LB dressing Person educated: Patient and Spouse Education method: Explanation, Demonstration,  Verbal cues, and Handouts Education comprehension: verbalized understanding and needs further education  HOME EXERCISE PROGRAM: PWR hands (see pt instructions)  GOALS: Goals reviewed with patient? Yes  SHORT TERM GOALS: Target date: 11/10/23  Pt will be Independent with PD specific HEP Baseline: new to OPOT Goal status: MET  2.  Pt will verbalize understanding of task modifications, adapted strategies, and/or potential DME/AE needs to increase ease, safety, and independence w/ ADLs/IADLs. Baseline: not engaging in IADLs at previous level, and increased time for ADLs Goal status: MET  3.  Pt will demonstrate improved fine motor coordination for ADLs as evidenced by decreasing 9 hole peg test score for RUE/LUE by 5 secs Baseline: 9 Hole Peg test: Right: 30.81 sec; Left: 39.18 sec 11/17/23: Right: 26.63 sec, Left: 31.72 sec 12/13/23: Right: 23.10 sec, Left: 25.72 sec Goal status: MET    LONG TERM GOALS: Target date: 12/15/23  Pt will demonstrate improved ease with feeding as evidenced by decreasing PPT#2 (self feeding) by 3 secs Baseline: 14.32 sec 12/11/23: 12.72 sec 12/13/23: 11.30 sec Goal status: MET  2.  Pt will demonstrate improved UE functional use for ADLs as evidenced by increasing box/ blocks score by 5 blocks with RUE/LUE Baseline: R: 52 and L: 50 12/06/23: R: 58 blocks  (pt hitting barrier x5-6) and  L: 64 Goal status: MET  3.  Pt will demonstrate understanding of memory compensations and ways to keep thinking skills sharp Baseline: impaired memory Goal status: MET  4.  Pt will demonstrate ability to walk 20' while carrying cup without any spillage. Baseline: spilling cup of water with ambulation Goal status: MET  ASSESSMENT:  CLINICAL IMPRESSION: Patient is a 63 y.o. male who was seen today for occupational therapy treatment session for parkinsonism with tremors.  Pt demonstrating improved fine and gross motor coordination during all tasks this session.  Pt and spouse reporting observing improved motor control and ease with aspects of ADLs.  Pt with good recall of techniques from previous session to aid in dressing and self-feeding.  Pt in agreement with d/c from therapy at this time.  Pt receptive to recommendation for PD screen in 6-8 months to assess status and need for therapy due to progressive nature of disorder.  Pt also reporting understanding of recommendation to reach out to MD for referral if concerns arise prior to scheduled screen.  PERFORMANCE DEFICITS: in functional skills including ADLs, IADLs, coordination, dexterity, sensation, ROM, strength, Fine motor control, Gross motor control, balance, body mechanics, endurance, decreased knowledge of precautions, decreased knowledge of use of DME, and UE functional use, cognitive skills including memory and safety awareness, and psychosocial skills including coping strategies, environmental adaptation, and routines and behaviors.   IMPAIRMENTS: are limiting patient from ADLs, IADLs, and leisure.     PLAN:  OT FREQUENCY: 1-2x/week  OT DURATION: 6 weeks (asking 8 weeks for scheduling)  PLANNED INTERVENTIONS: 02831 OT Re-evaluation, 97535 self care/ADL training, 02889 therapeutic exercise, 97530 therapeutic activity, 97112 neuromuscular re-education, 97750 Physical Performance Testing, balance training, functional mobility  training, psychosocial skills training, energy conservation, coping strategies training, patient/family education, and DME and/or AE instructions  RECOMMENDED OTHER SERVICES: PT  CONSULTED AND AGREED WITH PLAN OF CARE: Patient and family member/caregiver    KAYLENE DOMINO, OTR/L 12/13/2023, 10:37 AM  [facility] [address], [address] [place], [place] [address] Phone # [phone] Fax # [phone]

## 2023-12-13 NOTE — Patient Instructions (Signed)

## 2023-12-13 NOTE — Therapy (Signed)
 OUTPATIENT PHYSICAL THERAPY NEURO TREATMENT/DISCHARGE SUMMARY  Patient Name: Melvin Roberts MRN: [id number] DOB:10/31/1960, 63 y.o., male Today's Date: 12/13/2023   PCP: [name], MD REFERRING PROVIDER: [name], MD  PHYSICAL THERAPY DISCHARGE SUMMARY  Visits from Start of Care: 15  Current functional level related to goals / functional outcomes: See below-pt has met all 6 of 6 LTGs.   Remaining deficits: Gait and balance-improved   Education / Equipment: Educated in [employer], tips to reduce freezing with gait, community fitness options   Patient agrees to discharge. Patient goals were met. Patient is being discharged due to meeting the stated rehab goals.  Recommend return PT screen in 6-9 months.  END OF SESSION:  PT End of Session - 12/13/23 1109     Visit Number 15    Number of Visits 16    Date for PT Re-Evaluation 12/18/23    Authorization Type aetna; no VL    PT Start Time 1106    PT Stop Time 1144    PT Time Calculation (min) 38 min    Equipment Utilized During Treatment Gait belt    Activity Tolerance Patient tolerated treatment well    Behavior During Therapy WFL for tasks assessed/performed               Past Medical History:  Diagnosis Date   Allergy    Bipolar 1 disorder (HCC)    GERD (gastroesophageal reflux disease)    Hyperlipidemia    Hypertension    Past Surgical History:  Procedure Laterality Date   BACK SURGERY  11/17/1995   Dr. Duwayne   COLONOSCOPY     EYE SURGERY     HERNIA REPAIR  04/19/2007   umbilical   UPPER GASTROINTESTINAL ENDOSCOPY  x2   w/ esophageal dilation   Patient Active Problem List   Diagnosis Date Noted   Tremor of both hands 04/20/2023   History of bipolar disorder 04/20/2023   Short-term memory loss 04/20/2023   Cognitive attention deficit 04/20/2023   Ataxia involving legs 04/20/2023   Upper respiratory tract infection 08/17/2020   Vitamin D  deficiency 04/15/2019   BMI 32.0-32.9,adult  04/15/2019   Hypertension 09/16/2013   Bipolar disorder (HCC) 03/11/2013   Allergic rhinitis 03/11/2013   BPH (benign prostatic hyperplasia) 03/11/2013   Hyperlipidemia 05/29/2007   Depression, recurrent (HCC) 05/29/2007   Sinusitis, chronic 05/29/2007   ESOPHAGEAL STRICTURE 05/29/2007   GERD 05/29/2007   HIATAL HERNIA 05/29/2007   LOW BACK PAIN, CHRONIC 05/29/2007    ONSET DATE: 10/02/23  REFERRING DIAG:  R25.1 (ICD-10-CM) - Tremor of both hands  R41.3 (ICD-10-CM) - Short-term memory loss  Z86.59 (ICD-10-CM) - History of bipolar disorder  F33.9 (ICD-10-CM) - Depression, recurrent (HCC    THERAPY DIAG:  Unsteadiness on feet  Other abnormalities of gait and mobility  Other symptoms and signs involving the nervous system  Rationale for Evaluation and Treatment: Rehabilitation  SUBJECTIVE:  SUBJECTIVE STATEMENT: Stumbled getting off the mower.  Didn't hurt myself.  Was able to get up from the ground.    Pt accompanied by: self (spouse: [name])  PERTINENT HISTORY: parkinsonism, bipolar disorder, HTN, hyperlipidemia, knee surgeries 08/2022  PAIN:  Are you having pain? Yes: NPRS scale: I'm just a little bit sore but not bad Pain location: low back, knees Pain description: varies Aggravating factors: weather Relieving factors: varies  PRECAUTIONS: Fall  WEIGHT BEARING RESTRICTIONS: No  FALLS: Has patient fallen in last 6 months? Yes. Number of falls 2, in the last month. He was falling daily until getting the rollator RW in the past few months.  LIVING ENVIRONMENT: Lives with: lives with their spouse Lives in: House/apartment Stairs: Yes: External: 5 steps; can uses a rail-- he doesn't get his foot on the step well and going up he catches his foot Has following equipment at home:  Walker - 4 wheeled  PLOF: Independent , declining in past year  PATIENT GOALS: walk more steady (without device would be nice)  OBJECTIVE:    TODAY'S TREATMENT: 12/13/2023 Activity Comments  NuStep, Level 4>6, 4 extremities, x 8 minutes SPM>80-90, with 30 sec bouts >100; rates effort as 7/10  FOG questionnaire Score = 8 (he rates initially score as 16)  TUG:  18.72 sec with rollator 17.34 sec no device Improved from 25 sec  Gait velocity:  14.44 sec = 2.27 ft/sec Improved from 1.76 ft/sec  FTSTS:  15.25 sec Improved from 22.9 sec  Reviewed tips to reduce freezing and exercise options          PATIENT EDUCATION: Education details: Reviewed tips to reduce freezing and community fitness options; discussed POC and discharge today.  Discussed return screen in 6-9 months Person educated: Patient Education method: Explanation, Demonstration, Tactile cues, Verbal cues, and Handouts Education comprehension: verbalized understanding and returned demonstration    HOME EXERCISE PROGRAM Last updated: 10/23/23 Sitting PWR moves 2x10 each/day     Note: Objective measures were completed at Evaluation unless otherwise noted. DIAGNOSTIC FINDINGS: DAT scan, NPH assessment  COGNITION: Overall cognitive status: Impaired; he started aricept , also notes hearing declined significantly in last year   SENSATION: L hand  COORDINATION: RAM mildly ataxic, symmetrical with FTN and opposition  MUSCLE LENGTH: Hamstrings: tightness noted bilaterally  POSTURE: No Significant postural limitations  LOWER EXTREMITY ROM:    WFLs  LOWER EXTREMITY MMT:   MMT Right Eval Left Eval  Hip flexion 5/5 5/5  Hip extension    Hip abduction    Hip adduction    Hip internal rotation    Hip external rotation    Knee flexion 5/5 5/5  Knee extension 5/5 5/5  Ankle dorsiflexion 4-/5 5/5  Ankle plantarflexion    Ankle inversion    Ankle eversion    (Blank rows = not tested)  BED MOBILITY:  Findings:  Sit to supine Complete Independence  TRANSFERS: Sit to stand: Complete Independence  Assistive device utilized: to rollator RW      STAIRS: TBA-- has 5 at home  GAIT: Findings: Gait Characteristics: decreased step length- Right, decreased step length- Left, decreased stride length, decreased ankle dorsiflexion- Right, decreased ankle dorsiflexion- Left, shuffling, poor foot clearance- Right, and poor foot clearance- Left, Distance walked: 100 ft, Assistive device utilized:Walker - 4 wheeled and None, Level of assistance: SBA, and Comments: Patient ambulates with frequent festinating patterns and reports at times freezing for 1 minute in the home Gait speed=1.28 ft/sec without device Gait speed=1.76 ft/sec with  rollator  FUNCTIONAL TESTS:  5 times sit to stand: 22.92 Timed up and go (TUG): 25.22, TUG cognitive is 33.60 seconds with diminished accuracy in counting Berg Balance Scale:  Item Test date: 10/19/23 Test date: 11/27/23 Test date:   Sitting to standing 4. able to stand without using hands and stabilize independently 4. able to stand without using hands and stabilize independently  4. able to stand safely for 2 minutes  4. able to sit safely and securely for 2 minutes   4. sits safely with minimal use of hands  4. able to transfer safely with minor use of hands  4. able to stand 10 seconds safely  4. able to place feet together independently and stand 1 minute safely  4. can reach forward confidently 25 cm (10 inches)    4. able to pick up slipper safely and easily  3. looks behind one side only, other side shows less weight shift    2. able to turn 360 degrees safely but slowly  4. ble to stand independently and safely and complete 8 steps in 20 seconds    3. able to place foot ahead independently and hold 30 seconds   2. able to lift leg independently and hold >= 3 seconds     Insert OPRCBERGREEVAL SmartPhrase at re-test date  2. Standing unsupported 4. able to stand  safely for 2 minutes    3. Sitting with back unsupported, feet supported 4. able to sit safely and securely for 2 minutes    4. Standing to sitting 4. sits safely with minimal use of hands    5. Pivot transfer  4. able to transfer safely with minor use of hands    6. Standing unsupported with eyes closed 4. able to stand 10 seconds safely    7. Standing unsupported with feet together 3. able to place feet together independently and stand 1 minute with supervision    8. Reaching forward with outstretched arms while standing 2. can reach forward 5 cm (2 inches)    9. Pick up object from the floor from standing 3. able to pick up slipper but needs supervision    10. Turning to look behind over left and right shoulders while standing 2. turns sideways but only maintains balance    11. Turn 360 degrees 1. needs close supervision or verbal cuing    12. Place alternate foot on step or stool while standing unsupported 0. needs assistance to keep from falling/unable to try    13. Standing unsupported one foot in front 2. able to take small step independently and hold 30 seconds    14. Standing on one leg 1. tries to lift leg unable to hold 3 seconds but remains standing independently.      Total Score 38/56 Total Score x 50/56 Total Score x/56   Turning= 16 steps to the R and 22 steps to the L  PATIENT SURVEYS:  Freezing of gait questionnaire Not performed  [facility] Adult PT Treatment:                                                DATE: 10/19/23 Gait: Discussed tips to reduce freezing and practiced in session. Tips to reduce freezing episodes with standing or walking:  Stand tall with your feet wide, so that you can rock and weight shift through your hips. Don't try to fight the freeze: if you begin taking slower, faster, smaller steps, STOP, get your posture tall, and RESET your  posture and balance.  Take a deep breath before taking the BIG step to start again. March in place, with high knee stepping, to get started walking again. Use auditory cues:  Count out loud, think of a familiar tune or song or cadence, use pocket metronome, to use rhythm to get started walking again. Use visual cues:  Use a line to step over, use laser pointer line to step over, (using BIG steps) to start walking again. Use visual targets to keep your posture tall (look ahead and focus on an object or target at eye level). As you approach where your destination with walking, count your steps out loud and/or focus on your target with your eyes until you are fully there. Use appropriate assistive device, as advised by your physical therapist to assist with taking longer, consistent steps.   PATIENT EDUCATION: Education details: Nature of PT, goals for therapy, tips to reduce freezing. Person educated: Patient and Spouse Education method: Explanation, Demonstration, and Handouts Education comprehension: verbalized understanding and returned demonstration  HOME EXERCISE PROGRAM: None established-- provided tips for freezing  GOALS: Goals reviewed with patient? Yes  SHORT TERM GOALS: Target date: 11/18/23  The patient will perform HEP with wife's assist. Baseline: initiated at eval Goal status: MET  2.  The patient will improve gait speed to 2.5 ft/sec with rollater demonstrating dec'd festination. Baseline:  1.76 ft/sec; 2.11 ft/sec Goal status: NOT MET 11/27/23  3.   The patient will improve Berg to > or equal to 42/56.  Baseline:  38/56; 50/56 Goal status: MET  4.  The patient will move floor<>stand with UE support due to h/o falls. Baseline:  PT to further assess; independent Goal status: MET  LONG TERM GOALS: Target date: 12/19/23  The patient will have progression of HEP with wife's assist Baseline:  initiated at eval Goal status: MET, 12/13/2023  2.  The patient will improve  freezing of gait by 5 points to demonstrate improved functional abilities. Baseline: 17/24 10/23/23>score of 8 12/13/2023 (improved from 16) Goal status: MET 12/13/2023  3.  The patient will improve Berg to > or equal to 46/56. Baseline:  38/56; 50/56 Goal status: MET  4.  The patient will improve TUG score < or equal to 20 seconds. Baseline:  25.22 seconds>18.72 sec 12/13/2023 (4WW) Goal status: MET, 12/13/2023  5.  The patient will improve 5 time sit<>stand to < 18 seconds. Baseline: 22.92 seconds>15.15 sec 12/13/23 Goal status: MET, 12/13/2023  6.  The patient will report reduction in falls to < or equal to 2x/month. Baseline:  varies-- /has been as much as daily to 2x/month this year; 12/13/2023 1 fall in the past month /Goal status: MET, 12/13/2023  ASSESSMENT:  CLINICAL IMPRESSION: Pt presents today with no new complaints; feels therapy has really helped him. Skilled PT session focused on assessing LTGs. He  has met 6 of 6 LTGs.  He has improved all objective measures:  FTSTS 15.15 sec, gait velocity 2.27 ft/sec, TUG 17.34 sec, all improved from eval.  He is using rollator in community, no device at home and reports only 1 fall in the past month.  He verbalizes understanding of HEP and community fitness options.  He is appropriate for discharge from PT today.    OBJECTIVE IMPAIRMENTS: Abnormal gait, decreased balance, decreased coordination, decreased knowledge of use of DME, decreased mobility, difficulty walking, increased fascial restrictions, impaired flexibility, postural dysfunction, and pain.   ACTIVITY LIMITATIONS: carrying, lifting, bending, standing, transfers, bed mobility, and locomotion level  PARTICIPATION LIMITATIONS: cleaning, personal finances, driving, community activity, and occupation  PERSONAL FACTORS: 3+ comorbidities: see above are also affecting patient's functional outcome.   REHAB POTENTIAL: Good  CLINICAL DECISION MAKING: Unstable/unpredictable  EVALUATION  COMPLEXITY: High  PLAN:  PT FREQUENCY: 2x/week  PT DURATION: 8 weeks  PLANNED INTERVENTIONS: 97164- PT Re-evaluation, 97750- Physical Performance Testing, 97110-Therapeutic exercises, 97530- Therapeutic activity, [id number]- Neuromuscular re-education, 97535- Self Care, [id number]- Manual therapy, [phone]- Gait training, Patient/Family education, Balance training, Stair training, and DME instructions  PLAN FOR NEXT SESSION: Discharge PT; plan for return PT (OT and speech as well) screens in 6-9 months.  Greig Anon, PT 12/13/23 3:55 PM Phone: [phone] Fax: [phone]  [facility] at [facility] [address] [place], [address] [place], [place] [address] Phone # [phone] Fax # [phone]

## 2023-12-17 ENCOUNTER — Encounter: Payer: Self-pay | Admitting: Adult Health

## 2023-12-17 MED ORDER — TRAZODONE HCL 50 MG PO TABS: 50.0000 mg | ORAL_TABLET | Freq: Every day | ORAL | 1 refills | Status: DC

## 2023-12-19 ENCOUNTER — Ambulatory Visit: Admitting: Occupational Therapy

## 2023-12-21 ENCOUNTER — Encounter: Payer: Self-pay | Admitting: Family Medicine

## 2023-12-21 ENCOUNTER — Other Ambulatory Visit: Payer: Self-pay

## 2023-12-21 ENCOUNTER — Ambulatory Visit: Admitting: Family Medicine

## 2023-12-21 ENCOUNTER — Ambulatory Visit: Admitting: Occupational Therapy

## 2023-12-21 VITALS — BP 113/73 | HR 62 | Temp 98.3°F | Ht 71.0 in | Wt 233.0 lb

## 2023-12-21 DIAGNOSIS — R41840 Attention and concentration deficit: Secondary | ICD-10-CM | POA: Diagnosis not present

## 2023-12-21 DIAGNOSIS — F339 Major depressive disorder, recurrent, unspecified: Secondary | ICD-10-CM | POA: Diagnosis not present

## 2023-12-21 DIAGNOSIS — G20C Parkinsonism, unspecified: Secondary | ICD-10-CM | POA: Diagnosis not present

## 2023-12-21 DIAGNOSIS — I10 Essential (primary) hypertension: Secondary | ICD-10-CM

## 2023-12-21 MED ORDER — TRAZODONE HCL 50 MG PO TABS: 50.0000 mg | ORAL_TABLET | Freq: Every day | ORAL | 0 refills | Status: DC

## 2023-12-21 NOTE — Progress Notes (Signed)
 Subjective:  Patient ID: Melvin Roberts, male    DOB: [birth year]  Age: 63 y.o. MRN: [id number]  CC: Medical Management of Chronic Issues (3 month follow up/)   HPI  Discussed the use of AI scribe software for clinical note transcription with the patient, who gave verbal consent to proceed.  History of Present Illness Melvin Roberts is a 63 year old male with Parkinson's disease who presents for follow-up after recent medication adjustments.  He is experiencing improvement after recent changes to his medication regimen. He is taking less Depakote , now at a dose of one tablet in the morning and one at bedtime, which he feels is beneficial. He cannot recall all the specific adjustments made, but overall, his condition has improved.  He continues to experience tremors, which have improved but are still present. He also reports morning stiffness, which has been a long-standing issue. His wife notes that he has become more interactive and social since the reduction in Depakote , describing him as more like his usual self.  He is still taking Carbidopa -Levodopa , now switched from extended-release to instant-release, at a dose of 25 mg three times a day. He mentions that his legs sometimes swell, but this has improved with the use of diuretics prescribed previously.  He underwent a DAT test and a lumbar puncture, after which he noticed a temporary improvement in tremors and posture. He is awaiting further evaluation by his neurologist regarding these findings.  He completed a course of physical therapy, which he found beneficial, although he continues to use a rollator for walking due to balance issues and festination. He is scheduled for a urology appointment to further investigate prostate concerns.  No shortness of breath or chest pain.          12/21/2023   11:01 AM 09/20/2023    2:46 PM 09/05/2023    3:31 PM  Depression screen PHQ 2/9  Decreased Interest 1 0 1  Down,  Depressed, Hopeless 0 0 1  PHQ - 2 Score 1 0 2  Altered sleeping 1 1 0  Tired, decreased energy 1 1 1   Change in appetite 0 0 0  Feeling bad or failure about yourself  1 0 1  Trouble concentrating 1 1 1   Moving slowly or fidgety/restless 1 0 1  Suicidal thoughts 0 0 0  PHQ-9 Score 6 3 6   Difficult doing work/chores Not difficult at all  Not difficult at all    History Melvin Roberts has a past medical history of Allergy, Bipolar 1 disorder (HCC), GERD (gastroesophageal reflux disease), Hyperlipidemia, and Hypertension.   He has a past surgical history that includes Back surgery (11/17/1995); Hernia repair (04/19/2007); Eye surgery; Upper gastrointestinal endoscopy (x2); and Colonoscopy.   His family history includes Cancer in his father, maternal grandfather, mother, and paternal grandfather; Esophageal cancer in his maternal grandfather.He reports that he has never smoked. He has never used smokeless tobacco. He reports that he does not currently use alcohol. He reports that he does not use drugs.    ROS Review of Systems  Constitutional:  Negative for fever.  Respiratory:  Negative for shortness of breath.   Cardiovascular:  Negative for chest pain.  Musculoskeletal:  Negative for arthralgias.  Skin:  Negative for rash.    Objective:  BP 113/73   Pulse 62   Temp 98.3 F (36.8 C)   Ht 5' 11 (1.803 m)   Wt 233 lb (105.7 kg)   SpO2 96%   BMI 32.50  kg/m   BP Readings from Last 3 Encounters:  12/21/23 113/73  10/04/23 132/88  10/04/23 (!) 142/77    Wt Readings from Last 3 Encounters:  12/21/23 233 lb (105.7 kg)  10/02/23 236 lb (107 kg)  09/20/23 238 lb (108 kg)     Physical Exam Vitals reviewed.  Constitutional:      Appearance: He is well-developed.  HENT:     Head: Normocephalic and atraumatic.     Right Ear: External ear normal.     Left Ear: External ear normal.     Mouth/Throat:     Pharynx: No oropharyngeal exudate or posterior oropharyngeal erythema.   Eyes:     Pupils: Pupils are equal, round, and reactive to light.  Cardiovascular:     Rate and Rhythm: Normal rate and regular rhythm.     Heart sounds: No murmur heard. Pulmonary:     Effort: No respiratory distress.     Breath sounds: Normal breath sounds.  Musculoskeletal:     Cervical back: Normal range of motion and neck supple.  Neurological:     Mental Status: He is alert and oriented to person, place, and time.      Assessment & Plan:  Cognitive attention deficit  Depression, recurrent (HCC)  Primary hypertension  Parkinsonism, unspecified Parkinsonism type (HCC)    Assessment and Plan Assessment & Plan Parkinsonism   Tremor has improved, but stiffness remains, particularly in the morning. Although the DAT test did not confirm Parkinson's, clinical symptoms persist. Physical therapy has enhanced strength and control. Continue the current carbidopa /levodopa  regimen and maintain Depakote  at one tablet twice daily. Monitor symptoms and adjust treatment as needed after neurology follow-up.  Possible normal pressure hydrocephalus   Normal pressure hydrocephalus is considered a differential diagnosis due to balance issues and tremor. Lumbar puncture showed symptom improvement, suggesting potential benefit from shunt placement. Discussed treatment options, including shunt placement, which involves drilling a hole in the skull and placing a tube to drain excess fluid. Risks include potential meningitis. Await further evaluation and recommendations from neurology.       Follow-up: Return in about 3 months (around 03/21/2024).  [name], M.D.

## 2023-12-24 ENCOUNTER — Encounter: Payer: Self-pay | Admitting: Family Medicine

## 2023-12-24 DIAGNOSIS — G20C Parkinsonism, unspecified: Secondary | ICD-10-CM | POA: Insufficient documentation

## 2024-01-04 ENCOUNTER — Ambulatory Visit: Admitting: Adult Health

## 2024-01-04 ENCOUNTER — Encounter: Payer: Self-pay | Admitting: Adult Health

## 2024-01-04 DIAGNOSIS — F411 Generalized anxiety disorder: Secondary | ICD-10-CM

## 2024-01-04 DIAGNOSIS — F319 Bipolar disorder, unspecified: Secondary | ICD-10-CM

## 2024-01-04 DIAGNOSIS — G4700 Insomnia, unspecified: Secondary | ICD-10-CM

## 2024-01-04 MED ORDER — TRAZODONE HCL 50 MG PO TABS: 50.0000 mg | ORAL_TABLET | Freq: Every day | ORAL | 1 refills | Status: AC

## 2024-01-04 NOTE — Progress Notes (Signed)
 Melvin Roberts [id number] [birth year] 63 y.o.  Subjective:   Patient ID:  Melvin Roberts is a 63 y.o. (DOB 1960/08/11) male.  Chief Complaint: No chief complaint on file.   HPI Melvin Roberts presents to the office today for follow-up of Bipolar Disorder, GAD and insomnia.  Accompanied by wife.   Describes mood today as ok. Mood symptoms - reports decreased depression - more hopeful about things. Reports varying interest and motivation. Reports situational anxiety. Denies irritability. Denies panic attacks. Reports some worry and over thinking. Reports occasional rumination. Reports mood is stable. Stating I feel like I'm doing about the same - I would like some answers. Taking medications as prescribed. Energy levels vary. Active, does not have a regular exercise routine - walking more.  Enjoys some usual interests and activities. Married. Lives with wife. Has 5 children and 11 grandchildren. Spending time with family. Appetite adequate. Weight loss - 225 from 242 pounds. Reports sleep consistent. Averages 7 to 8 hours. Reports some daytime napping. Reports focus and concentration difficulties - it's a little better. Managing minimal aspects of household. Completing tasks. Disabled. Denies SI or HI.  Denies AH or VH. Denies self harm. Denies substance use. Reports hearing loss.  Previous medication trials: Lithium , Seroquel     GAD-7    Flowsheet Row Office Visit from 12/21/2023 in [facility] Office Visit from 09/20/2023 in [facility] Office Visit from 09/05/2023 in [facility] Office Visit from 08/21/2023 in [facility] Office Visit from 08/03/2023 in [facility]  Total GAD-7 Score 2 1 1 2 4    Mini-Mental    Flowsheet Row Office Visit from 04/20/2023 in [facility] Office Visit from 03/22/2023 in [facility]  Total Score (max 30 points ) 25 30   PHQ2-9    Flowsheet Row Office Visit from 12/21/2023 in [facility] Office Visit from 09/20/2023 in [facility] Office Visit from 09/05/2023 in [facility] Office Visit from 08/21/2023 in [facility] Office Visit from 08/03/2023 in [facility]  PHQ-2 Total Score 1 0 2 1 2   PHQ-9 Total Score 6 3 6 6 5    Flowsheet Row ED from 09/10/2023 in [facility] at [facility] ED from 02/26/2022 in [facility] Emergency Department at [facility] ED from 09/20/2020 in [facility] Emergency Department at [facility]  C-SSRS RISK CATEGORY No Risk No Risk No Risk     Review of Systems:  Review of Systems  Musculoskeletal:  Negative for gait problem.  Neurological:  Negative for tremors.  Psychiatric/Behavioral:         Please refer to HPI    Medications: I have reviewed the patient's current medications.  Current Outpatient Medications  Medication Sig Dispense Refill   allopurinol  (ZYLOPRIM ) 300 MG tablet Take 1 tablet (300 mg total) by mouth daily. 90 tablet 1   carbidopa -levodopa  (SINEMET  IR) 25-100 MG tablet Take 1 tablet by mouth 3 (three) times daily. Take med 30 minutes before a meal. 270 tablet 3   cholecalciferol (VITAMIN D ) 1000 UNITS tablet Take 1,000 Units by mouth daily.     colchicine  0.6 MG tablet Take twice daily for gout attack. (may take every two hours up to 6 doses at  acute onset) 60 tablet 2   divalproex  (DEPAKOTE ) 500 MG DR tablet Take two tablets in the morning and take two tablets at bedtime. (Patient taking differently: Take 500 mg by mouth 2 (two) times daily. Take one tablet in the morning and take one tablet at bedtime.) 360 tablet 1   donepezil   (ARICEPT ) 5 MG tablet TAKE 1 TABLET BY MOUTH EVERYDAY AT BEDTIME 90 tablet 0   famotidine  (PEPCID ) 20 MG tablet Take 1 tablet (20 mg total) by mouth 2 (two) times daily. 180 tablet 3   pantoprazole  (PROTONIX ) 40 MG tablet Take 1 tablet (40 mg total) by mouth daily. 90 tablet 3   tamsulosin  (FLOMAX ) 0.4 MG CAPS capsule Take 2 capsules (0.8 mg total) by mouth at bedtime. For urine flow and prostate 180 capsule 3   traZODone  (DESYREL ) 50 MG tablet Take 1 tablet (50 mg total) by mouth at bedtime. 90 tablet 1   No current facility-administered medications for this visit.    Medication Side Effects: None  Allergies:  Allergies  Allergen Reactions   Lipitor [Atorvastatin] Other (See Comments)   Niacin Other (See Comments)    Hot flash    Past Medical History:  Diagnosis Date   Allergy    Bipolar 1 disorder (HCC)    GERD (gastroesophageal reflux disease)    Hyperlipidemia    Hypertension     Past Medical History, Surgical history, Social history, and Family history were reviewed and updated as appropriate.   Please see review of systems for further details on the patient's review from today.   Objective:   Physical Exam:  There were no vitals taken for this visit.  Physical Exam Constitutional:      General: He is not in acute distress. Musculoskeletal:        General: No deformity.  Neurological:     Mental Status: He is alert and oriented to person, place, and time.     Coordination: Coordination normal.  Psychiatric:        Attention and Perception: Attention and perception normal. He does not perceive auditory or visual hallucinations.        Mood and Affect: Mood normal. Mood is not anxious or depressed. Affect is not labile, blunt, angry or inappropriate.        Speech: Speech normal.        Behavior: Behavior normal.        Thought Content: Thought content normal. Thought content is not paranoid or delusional. Thought content does not include homicidal or suicidal  ideation. Thought content does not include homicidal or suicidal plan.        Cognition and Memory: Cognition and memory normal.        Judgment: Judgment normal.     Comments: Insight intact     Lab Review:     Component Value Date/Time   NA 137 09/10/2023 2039   NA 143 09/05/2023 1629   K 4.4 09/10/2023 2039   CL 101 09/10/2023 2039   CO2 25 09/10/2023 2039   GLUCOSE 97 09/10/2023 2039   BUN 18 09/10/2023 2039   BUN 10 09/05/2023 1629   CREATININE 1.55 (H) 09/10/2023 2039   CALCIUM  9.7 09/10/2023 2039   PROT 6.8 09/10/2023 2039   PROT 6.2 09/05/2023 1629   ALBUMIN 3.6 09/10/2023 2039   ALBUMIN 3.5 (L) 09/05/2023 1629   AST 19 09/10/2023 2039   ALT <5 09/10/2023 2039   ALKPHOS 58 09/10/2023 2039   BILITOT 0.2 09/10/2023 2039  BILITOT 0.3 09/05/2023 1629   GFRNONAA 50 (L) 09/10/2023 2039   GFRAA 61 05/29/2020 0854       Component Value Date/Time   WBC 7.0 09/10/2023 2039   RBC 3.79 (L) 09/10/2023 2039   HGB 11.4 (L) 09/10/2023 2039   HGB 11.2 (L) 09/05/2023 1629   HCT 35.3 (L) 09/10/2023 2039   HCT 34.4 (L) 09/05/2023 1629   PLT 193 09/10/2023 2039   PLT 194 09/05/2023 1629   MCV 93.1 09/10/2023 2039   MCV 94 09/05/2023 1629   MCH 30.1 09/10/2023 2039   MCHC 32.3 09/10/2023 2039   RDW 14.6 09/10/2023 2039   RDW 15.1 09/05/2023 1629   LYMPHSABS 1.9 09/10/2023 2039   LYMPHSABS 1.6 09/05/2023 1629   MONOABS 0.6 09/10/2023 2039   EOSABS 0.1 09/10/2023 2039   EOSABS 0.1 09/05/2023 1629   BASOSABS 0.0 09/10/2023 2039   BASOSABS 0.0 09/05/2023 1629    Lithium  Lvl  Date Value Ref Range Status  06/09/2022 1.3 (HH) 0.5 - 1.2 mmol/L Final    Comment:    A concentration of 0.5-0.8 mmol/L is advised for long-term use; concentrations of up to 1.2 mmol/L may be necessary during acute treatment.                                  Detection Limit = 0.1                           <0.1 indicates None Detected Patient drug level exceeds published reference range.   Evaluate clinically for signs of potential toxicity.      Lab Results  Component Value Date   VALPROATE 53 12/08/2023     .res Assessment: Plan:    Plan:  PDMP reviewed  Continue:  Depakote  1000mg  daily.  Trazadone 100mg  - taking 1/2 tablet daily.  Neuropsych evaluation completed in April.  RTC 3/4 weeks   30 minutes spent dedicated to the care of this patient on the date of this encounter to include pre-visit review of records, ordering of medication, post visit documentation, and face-to-face time with the patient discussing Bipolar Disorder, GAD and insomnia. Discussed continuing current medication regimen.  Patient totally disabled an unable to work with current disabilities - mental health issues Bipolar disorder and recently diagnosed with Parkinson's with Lewy Body Dementia.   Discussed potential metabolic side effects associated with atypical antipsychotics, as well as potential risk for movement side effects. Advised pt to contact office if movement side effects occur.    Patient advised to contact office with any questions, adverse effects, or acute worsening in signs and symptoms.  Diagnoses and all orders for this visit:  Bipolar I disorder (HCC)  Generalized anxiety disorder  Insomnia, unspecified type -     traZODone  (DESYREL ) 50 MG tablet; Take 1 tablet (50 mg total) by mouth at bedtime.     Please see After Visit Summary for patient specific instructions.  Future Appointments  Date Time Provider Department Center  01/08/2024 10:30 AM Dohmeier, Dedra, MD [facility] None  03/21/2024  9:55 AM Zollie Lowers, MD [facility] [address]  05/30/2024 11:45 AM Jacelyn Lupita NOVAK, CCC-SLP [facility]  05/30/2024 12:00 PM Kaylene Lauraine HERO, OT [facility]  05/30/2024 12:15 PM Marriott, Greig ORN, PT [facility]    No orders of the defined types were placed in this encounter.   -------------------------------

## 2024-01-07 ENCOUNTER — Encounter: Payer: Self-pay | Admitting: Family Medicine

## 2024-01-08 ENCOUNTER — Encounter: Payer: Self-pay | Admitting: Neurology

## 2024-01-08 ENCOUNTER — Other Ambulatory Visit: Payer: Self-pay | Admitting: Family Medicine

## 2024-01-08 ENCOUNTER — Ambulatory Visit (INDEPENDENT_AMBULATORY_CARE_PROVIDER_SITE_OTHER): Admitting: Neurology

## 2024-01-08 VITALS — BP 120/80 | HR 76 | Ht 71.0 in | Wt 235.0 lb

## 2024-01-08 DIAGNOSIS — R41840 Attention and concentration deficit: Secondary | ICD-10-CM

## 2024-01-08 DIAGNOSIS — R251 Tremor, unspecified: Secondary | ICD-10-CM

## 2024-01-08 DIAGNOSIS — R2689 Other abnormalities of gait and mobility: Secondary | ICD-10-CM | POA: Insufficient documentation

## 2024-01-08 DIAGNOSIS — N3941 Urge incontinence: Secondary | ICD-10-CM | POA: Insufficient documentation

## 2024-01-08 DIAGNOSIS — G3184 Mild cognitive impairment, so stated: Secondary | ICD-10-CM | POA: Insufficient documentation

## 2024-01-08 DIAGNOSIS — G20C Parkinsonism, unspecified: Secondary | ICD-10-CM

## 2024-01-08 DIAGNOSIS — G912 (Idiopathic) normal pressure hydrocephalus: Secondary | ICD-10-CM

## 2024-01-08 NOTE — Telephone Encounter (Signed)
 Copied from CRM [id number]. Topic: Clinical - Medication Refill >> Jan 08, 2024  3:59 PM Yolanda T wrote: Patient took last dosage last night  Medication: donepezil  (ARICEPT ) 5 MG tablet  Has the patient contacted their pharmacy? Yes  This is the patient's preferred pharmacy:  [id number] - [place], [place] - [address] [place] [address] Phone: [phone] Fax: [phone]  Is this the correct pharmacy for this prescription? Yes  Has the prescription been filled recently? Yes  Is the patient out of the medication? Yes  Has the patient been seen for an appointment in the last year OR does the patient have an upcoming appointment? Yes  Can we respond through MyChart? Yes  Agent: Please be advised that Rx refills may take up to 3 business days. We ask that you follow-up with your pharmacy.

## 2024-01-08 NOTE — Addendum Note (Signed)
 Addended by: CHALICE SAUNAS on: 01/08/2024 10:54 AM   Modules accepted: Orders

## 2024-01-08 NOTE — Patient Instructions (Signed)
 ASSESSMENT AND PLAN :   63 y.o. year old male  here with:    1)  STM impairment, MMSE 29/ 30 today   2)  cognitive attention deficit.   3)  balance and gait impairment,  festinated gait , he started on sinemet  but noted no impairment   4) tremor of variable amplitude     Post Lp results were insignificant , NPH-  10-04-2023- at [facility].  But no memory test was done.   I want to repeat the LP and NPH testing at [facility]- I gave instructions to my Nurse, Catheryn Alexander.    I will wean him off sinemet  form tid, to bid for 14 days to once a day 14 days. Po.   RV in January with Np or me.  Fluid in the Brain (Normal-Pressure Hydrocephalus) in Adults: What to Know Normal-pressure hydrocephalus (NPH) is a build-up of fluid called cerebrospinal fluid, or CSF, inside the brain. NPH doesn't usually increase the pressure inside the head. CSF is normally present in the brain, but when too much CSF is made and can't leave the brain, it can change how the brain works. NPH usually happens in people who are older than 63 years old. Many symptoms of NPH are also found with aging or with certain health problems. It is important to pay attention to any changes in your mood or your ability to think or walk. What are the causes? A build-up of fluid inside the brain may be caused by anything that blocks the flow of CSF, such as: Head injury. Infections. Bleeding from a blood vessel in the brain. In many cases, the cause is not known. What are the signs or symptoms? Symptoms include: Trouble walking. You may: Shuffle your feet when walking. Feel unsteady. Have problems walking after you have been sitting. Feel that your feet are stuck to the floor. Problems with memory and thinking, like: Forgetfulness. Not being able to concentrate. Mood problems. Confusion. Problems holding your pee. How is this diagnosed? A build-up of fluid inside the brain is diagnosed based on: Your medical  history. A physical exam. This can show walking changes, twitching movements, or sudden muscle tightening (spasms) in the legs. Imaging tests such as: CT scan. MRI. You may need a test that uses a needle placed in your spine. This is called a lumbar puncture or spinal tap. During the spinal tap the health care provider may: Remove and look at a large amount of CSF. Inject a radioactive dye in order to take pictures with a special camera. These pictures can show how well the dye is flowing through the cavities within your brain. How is this treated? This problem may be treated with surgery in which a thin tube is placed in the brain to drain extra CSF into another part of the body. This is called a shunt. After a shunt is placed, you will be monitored to make sure CSF is draining properly. In some cases, a shunt may be placed in the lower spine instead of the brain. If your symptoms are mild, you may be monitored regularly before a decision is made about whether a shunt is needed. Follow these instructions at home: Medicines Take your medicines only as told. Avoid taking medicines that can affect thinking, such as pain medicines or sleeping medicines. Lifestyle Do not smoke, vape, or use nicotine or tobacco. Drink more fluids as told. General instructions Work with your provider to determine what your safety needs are and if  you need help at home. Ask what things are safe for you to do at home. Ask when you can go back to work or school. Keep all follow-up visits. Your provider will monitor your symptoms and the need for surgery. Contact a health care provider if: You have any new symptoms. Your symptoms get worse. You are having trouble walking or with balance. You have problems holding your pee. You or your family members become concerned for your safety. You notice you feel confused sometimes. Get help right away if: You start to twitch or shake (have a seizure). You have: A fever  or other signs of infection. A hard time staying awake. A headache that is getting worse. Blurred vision, especially early in the morning. These symptoms may be an emergency. Call 911 right away. Do not wait to see if the symptoms will go away. Do not drive yourself to the hospital. This information is not intended to replace advice given to you by your health care provider. Make sure you discuss any questions you have with your health care provider. Document Revised: 02/19/2023 Document Reviewed: 02/19/2023 Elsevier Patient Education  2025 [employer].

## 2024-01-08 NOTE — Progress Notes (Signed)
 Provider:  [name], MD  Primary Care Physician:  Zollie Lowers, MD [address] [place] [address]     Referring Provider: Zollie Lowers, Md [address] [place],  [place] [address]          Chief Complaint according to patient   Patient presents with:                HISTORY OF PRESENT ILLNESS:  Melvin Roberts is a 63 y.o. male patient who is here for revisit 01/08/2024 for  Memory, gait and tremor.  Summary: .  We reviewed today the brain MRI study that the patient underwent on May 03, 2023 which showed some temporal lobe atrophy but had disproportional enlargement of the ventricles as well.,  His Mini-Mental status examination was obtained today showing 29 out of 30 points which is excellent.  A MoCA test last time we met was in the lower 20 range out of 30 points.  He has spoken in August to Dr. Corina, neuropsychology, who was wondering if his gait disorder and memory impairment may be related to normal pressure hydrocephalus.  And reviewing his MRI brain his negative DaTscan  is normal parkinsonian features I think this is a valid concern.    Chief concern according to patient :   hearing loss, tinnitus - may be related to his work environment- but tremor   gait disorder, memory impairment could be related to NPH.   10-04-2023.  He underwent a spinal tap and felt that the process helped him for about 2 days, he was less tremulous and didn't list forwards.  Walking test. Clear colorless CSF spontaneously returned, with opening pressure of 17 cm water. 30 ml CSF were collected and divided among 5 sterile vials for the requested laboratory studies.      IMPRESSION: Ioflupane scan within normal limits. No reduced radiotracer activity in basal ganglia to suggest Parkinson's syndrome pathology.   Of note, DaTSCAN  is not diagnostic of Parkinsonian syndromes, which remains a clinical diagnosis. DaTscan  is an adjuvant test to aid in the  clinical diagnosis of Parkinsonian syndromes.     Electronically Signed   By: Jackquline Boxer M.D.   Patient Active Problem List    Diagnosis Date Noted   Tremor of both hands 04/20/2023   History of bipolar disorder 04/20/2023   Short-term memory loss 04/20/2023   Cognitive attention deficit 04/20/2023   Ataxia involving legs 04/20/2023   Upper respiratory tract infection 08/17/2020   Vitamin D  deficiency 04/15/2019   BMI 32.0-32.9,adult 04/15/2019   Hypertension 09/16/2013   Bipolar disorder (HCC) 03/11/2013   Allergic rhinitis 03/11/2013   BPH (benign prostatic hyperplasia) 03/11/2013   Hyperlipidemia 05/29/2007   Depression, recurrent (HCC) 05/29/2007   Sinusitis, chronic 05/29/2007   ESOPHAGEAL STRICTURE 05/29/2007   GERD 05/29/2007   HIATAL HERNIA 05/29/2007   LOW BACK PAIN, CHRONIC 05/29/2007       Review of Systems: Out of a complete 14 system review, the patient complains of only the following symptoms, and all other reviewed systems are negative.:   SLEEPINESS ?  How likely are you to doze in the following situations: 0 = not likely, 1 = slight chance, 2 = moderate chance, 3 = high chance  Sitting and Reading? Watching Television? Sitting inactive in a public place (theater or meeting)? Lying down in the afternoon when circumstances permit? Sitting and talking to someone? Sitting quietly after lunch without alcohol? In a  car, while stopped for a few minutes in traffic? As a passenger in a car for an hour without a break?  Total =        Social History   Socioeconomic History   Marital status: Married    Spouse name: Not on file   Number of children: Not on file   Years of education: Not on file   Highest education level: Associate degree: occupational, Scientist, product/process development, or vocational program  Occupational History   Not on file  Tobacco Use   Smoking status: Never   Smokeless tobacco: Never  Vaping Use   Vaping status: Never Used  Substance and  Sexual Activity   Alcohol use: Not Currently   Drug use: No   Sexual activity: Not on file  Other Topics Concern   Not on file  Social History Narrative   Not on file   Social Drivers of Health   Financial Resource Strain: Low Risk  (06/07/2023)   Overall Financial Resource Strain (CARDIA)    Difficulty of Paying Living Expenses: Not hard at all  Food Insecurity: No Food Insecurity (06/07/2023)   Hunger Vital Sign    Worried About Running Out of Food in the Last Year: Never true    Ran Out of Food in the Last Year: Never true  Transportation Needs: No Transportation Needs (06/07/2023)   PRAPARE - Administrator, Civil Service (Medical): No    Lack of Transportation (Non-Medical): No  Physical Activity: Sufficiently Active (06/07/2023)   Exercise Vital Sign    Days of Exercise per Week: 3 days    Minutes of Exercise per Session: 50 min  Stress: No Stress Concern Present (06/07/2023)   [employer] of Occupational Health - Occupational Stress Questionnaire    Feeling of Stress : Only a little  Social Connections: Socially Integrated (06/07/2023)   Social Connection and Isolation Panel    Frequency of Communication with Friends and Family: Twice a week    Frequency of Social Gatherings with Friends and Family: Once a week    Attends Religious Services: 1 to 4 times per year    Active Member of [employer] or Organizations: Yes    Attends Banker Meetings: 1 to 4 times per year    Marital Status: Married    Family History  Problem Relation Age of Onset   Cancer Mother        breast   Cancer Father        lug   Esophageal cancer Maternal Grandfather    Cancer Maternal Grandfather    Cancer Paternal Grandfather    Colon cancer Neg Hx    Colon polyps Neg Hx    Rectal cancer Neg Hx    Stomach cancer Neg Hx     Past Medical History:  Diagnosis Date   Allergy    Bipolar 1 disorder (HCC)    GERD (gastroesophageal reflux disease)    Hyperlipidemia     Hypertension     Past Surgical History:  Procedure Laterality Date   BACK SURGERY  11/17/1995   Dr. Duwayne   COLONOSCOPY     EYE SURGERY     HERNIA REPAIR  04/19/2007   umbilical   UPPER GASTROINTESTINAL ENDOSCOPY  x2   w/ esophageal dilation     Current Outpatient Medications on File Prior to Visit  Medication Sig Dispense Refill   allopurinol  (ZYLOPRIM ) 300 MG tablet Take 1 tablet (300 mg total) by mouth daily. 90 tablet 1  carbidopa -levodopa  (SINEMET  IR) 25-100 MG tablet Take 1 tablet by mouth 3 (three) times daily. Take med 30 minutes before a meal. 270 tablet 3   cholecalciferol (VITAMIN D ) 1000 UNITS tablet Take 1,000 Units by mouth daily.     colchicine  0.6 MG tablet Take twice daily for gout attack. (may take every two hours up to 6 doses at acute onset) 60 tablet 2   divalproex  (DEPAKOTE ) 500 MG DR tablet Take two tablets in the morning and take two tablets at bedtime. (Patient taking differently: Take 500 mg by mouth 2 (two) times daily. Take one tablet in the morning and take one tablet at bedtime.) 360 tablet 1   donepezil  (ARICEPT ) 5 MG tablet TAKE 1 TABLET BY MOUTH EVERYDAY AT BEDTIME 90 tablet 0   famotidine  (PEPCID ) 20 MG tablet Take 1 tablet (20 mg total) by mouth 2 (two) times daily. 180 tablet 3   pantoprazole  (PROTONIX ) 40 MG tablet Take 1 tablet (40 mg total) by mouth daily. 90 tablet 3   tamsulosin  (FLOMAX ) 0.4 MG CAPS capsule Take 2 capsules (0.8 mg total) by mouth at bedtime. For urine flow and prostate (Patient taking differently: Take 0.4 mg by mouth at bedtime. For urine flow and prostate) 180 capsule 3   traZODone  (DESYREL ) 50 MG tablet Take 1 tablet (50 mg total) by mouth at bedtime. 90 tablet 1   Vibegron (GEMTESA) 75 MG TABS Take 75 mg by mouth at bedtime.     No current facility-administered medications on file prior to visit.    Allergies  Allergen Reactions   Lipitor [Atorvastatin] Other (See Comments)   Niacin Other (See Comments)    Hot flash      DIAGNOSTIC DATA (LABS, IMAGING, TESTING) - I reviewed patient records, labs, notes, testing and imaging myself where available.  Lab Results  Component Value Date   WBC 7.0 09/10/2023   HGB 11.4 (L) 09/10/2023   HCT 35.3 (L) 09/10/2023   MCV 93.1 09/10/2023   PLT 193 09/10/2023      Component Value Date/Time   NA 137 09/10/2023 2039   NA 143 09/05/2023 1629   K 4.4 09/10/2023 2039   CL 101 09/10/2023 2039   CO2 25 09/10/2023 2039   GLUCOSE 97 09/10/2023 2039   BUN 18 09/10/2023 2039   BUN 10 09/05/2023 1629   CREATININE 1.55 (H) 09/10/2023 2039   CALCIUM  9.7 09/10/2023 2039   PROT 6.8 09/10/2023 2039   PROT 6.2 09/05/2023 1629   ALBUMIN 3.6 09/10/2023 2039   ALBUMIN 3.5 (L) 09/05/2023 1629   AST 19 09/10/2023 2039   ALT <5 09/10/2023 2039   ALKPHOS 58 09/10/2023 2039   BILITOT 0.2 09/10/2023 2039   BILITOT 0.3 09/05/2023 1629   GFRNONAA 50 (L) 09/10/2023 2039   GFRAA 61 05/29/2020 0854   Lab Results  Component Value Date   CHOL 172 07/28/2021   HDL 33 (L) 07/28/2021   LDLCALC 105 (H) 07/28/2021   TRIG 195 (H) 07/28/2021   CHOLHDL 5.2 (H) 07/28/2021   Lab Results  Component Value Date   HGBA1C 5.7 (H) 04/20/2023   Lab Results  Component Value Date   VITAMINB12 830 04/20/2023   Lab Results  Component Value Date   TSH 1.710 04/20/2023    PHYSICAL EXAM:  Vitals:   01/08/24 0959  BP: 120/80  Pulse: 76   No data found. Body mass index is 32.78 kg/m.   Wt Readings from Last 3 Encounters:  01/08/24 235 lb (106.6 kg)  12/21/23 233 lb (105.7 kg)  10/02/23 236 lb (107 kg)     Ht Readings from Last 3 Encounters:  01/08/24 5' 11 (1.803 m)  12/21/23 5' 11 (1.803 m)  10/02/23 5' 11 (1.803 m)      General: The patient is awake, alert and appears not in acute distress and groomed. Head: Normocephalic, atraumatic.  Neck is supple. Normocephalic, atraumatic. Neck is supple. Dental status:  Cardiovascular:  Regular rate and cardiac rhythm by  pulse, without distended neck veins. Respiratory: Lungs are clear to auscultation.  Skin:  Without evidence of ankle edema, or rash. Trunk: he sits erect. He has great difficulties to get out of the chair.      NEUROLOGIC EXAM: The patient is awake and alert, oriented to place and time.   Memory subjective described as intact.  Attention span & concentration ability appears normal.  Speech is fluent,  without  dysarthria, dysphonia or aphasia.  Mood and affect are appropriate.   Cranial nerves: no loss of smell or taste reported    Pupils are equal and briskly reactive to light. Funduscopic exam deferred. Extraocular movements with coarse saccades-   nystagmus. No Diplopia. Visual fields by finger perimetry are intact. Hearing was intact with hearing aids.   Facial sensation intact to fine touch.  NO FACIAL MASKING>   Facial motor strength is symmetric and tongue and uvula move midline.  Neck ROM : rotation, tilt and flexion extension were normal for age and shoulder shrug was symmetrical.     Motor exam:  Symmetric bulk, tone and ROM.   Elevated tone without cog- wheeling,  more waxy than clasp knife-  And symmetric grip strength .   Sensory:  normal.  Proprioception tested in the upper extremities was normal.   Coordination: Rapid alternating movements in the fingers/hands were of reduced  speed.  The Finger-to-nose maneuver was intact , slowed but no evidence of ataxia, dysmetria and minimal  tremor.   Gait and station: Patient could  not rise unassisted from a seated position, walked with a walker as assistive device.       Deep tendon reflexes: in the  upper and lower extremities are symmetric and intact.      Impression/DX:       08-10-2023                    Melvin Roberts is a 63 year old male referred by his neurologist, [name], MD, for neuropsychological consultation due to progressively worsening cognitive and motor changes. Primary complaints  include difficulties with short-term memory, episodes of confusion, bilateral tremors, and gait disturbance with a rightward lean and track. Symptoms reportedly began after a knee surgery in May of 2023, though his wife notes an earlier onset of confusion in 2022. Functional difficulties include problems with problem-solving, decision-making, and sequencing, leading to his retirement. He has stopped driving due to a rightward drift. His symptoms have been slowly progressive.  While the patient is still being worked up one of the potential etiological factors considered is normal pressure hydrocephalus.   Disposition/Plan:                      Further evaluation will be scheduled. This will include assessments such as the Wechsler Adult Intelligence Scale, Wechsler Memory Scale, and controlled oral word association test. A formal report will be generated and made available in the EMR, with copies provided to the referring physician and the neurology team.  Diagnosis:                                    Cognitive and neurobehavioral dysfunction   Short-term memory loss   Cognitive attention deficit   Bipolar affective disorder, remission status unspecified (HCC)    ASSESSMENT AND PLAN :   63 y.o. year old male  here with:    1)  STM impairment, MMSE 29/ 30 today   2)  cognitive attention deficit.   3)  balance and gait impairment,  festinated gait , he started on sinemet  but noted no impairment   4) tremor of variable amplitude     Post Lp results were insignificant , NPH-  10-04-2023- at [facility].  But no memory test was done.   I want to repeat the LP and NPH testing at [facility]- I gave instructions to my Nurse, Catheryn Alexander.    I will wean him off sinemet  form tid, to bid for 14 days to once a day 14 days. Po.   RV in January with Np or me.    I would like to thank Zollie Lowers, MD and Zollie Lowers, Md [address] [place],  [place] [address] for allowing me to meet with  this pleasant patient.   Sleep Clinic Patients are generally offered input on sleep hygiene, life style changes and how to improve compliance with medical treatment where applicable. Review and reiteration of good sleep hygiene measures is offered to any sleep clinic patient, be it in the first consultation or with any follow up visits.    Any patient with sleepiness should be cautioned not to drive, work at heights, or operate dangerous or heavy equipment when feeling tired or sleepy.      The patient will be seen in follow-up in the sleep clinic at [facility] for discussion of test results, sleep related symptoms and treatment compliance review, further management strategies, etc.   The referring provider will be notified of the test results.   The patient's condition requires frequent monitoring and adjustments in the treatment plan, reflecting the ongoing complexity of care.  This provider is the continuing focal point for all needed services for this condition.  After spending a total time of  35  minutes face to face and time for  history taking, physical and neurologic examination, review of laboratory studies,  personal review of imaging studies, reports and results of other testing and review of referral information / records as far as provided in visit,   Electronically signed by: [name], MD 01/08/2024 10:20 AM  [facility] Board certified by The [employer] of Sleep Medicine and Diplomate of the [employer] of Sleep Medicine. Board certified In Neurology through the ABPN, Fellow of the [employer] of Neu[employer].

## 2024-01-09 ENCOUNTER — Telehealth: Payer: Self-pay | Admitting: Neurology

## 2024-01-09 NOTE — Addendum Note (Signed)
 Addended by: Gabryelle Whitmoyer K on: 01/09/2024 03:55 PM   Modules accepted: Orders

## 2024-01-09 NOTE — Telephone Encounter (Signed)
 Referral for physical therapy fax to [facility]. Phone: [phone], Fax: [phone]

## 2024-01-09 NOTE — Telephone Encounter (Signed)
 This was wrong, it needed to go to [facility] to be scheduled as pre and post lumbar puncture PT. I changed the location on the referral and sent a fax to the hospital to schedule. [phone]

## 2024-01-09 NOTE — Addendum Note (Signed)
 Addended by: Adelisa Satterwhite K on: 01/09/2024 11:21 AM   Modules accepted: Orders

## 2024-01-11 ENCOUNTER — Ambulatory Visit: Payer: Self-pay | Admitting: Neurology

## 2024-01-11 ENCOUNTER — Ambulatory Visit
Admission: RE | Admit: 2024-01-11 | Discharge: 2024-01-11 | Disposition: A | Discharge status: Home / Self Care | Source: Ambulatory Visit | Attending: Neurology | Admitting: Neurology

## 2024-01-11 DIAGNOSIS — G3184 Mild cognitive impairment, so stated: Secondary | ICD-10-CM

## 2024-01-11 DIAGNOSIS — R251 Tremor, unspecified: Secondary | ICD-10-CM

## 2024-01-11 DIAGNOSIS — R2689 Other abnormalities of gait and mobility: Secondary | ICD-10-CM

## 2024-01-11 DIAGNOSIS — G20C Parkinsonism, unspecified: Secondary | ICD-10-CM | POA: Diagnosis not present

## 2024-01-11 DIAGNOSIS — N3941 Urge incontinence: Secondary | ICD-10-CM

## 2024-01-16 ENCOUNTER — Encounter: Payer: Self-pay | Admitting: Psychology

## 2024-01-16 NOTE — Progress Notes (Signed)
 Neuropsychological Evaluation   Patient:  Melvin Roberts   DOB: 07-31-1960  MR Number: [id number]  Location: [facility] [address] [place], [address] [place] [address] Dept: [phone]  Start: 2 PM End: 3 PM  Provider/Observer:     Norleen JONELLE Asa PsyD  Chief Complaint:      Chief Complaint  Patient presents with   Gait Problem   Memory Loss   Other    Slowed information processing, confusion, tremors    Reason For Service:     MODESTO GANOE is a 63 year old male is referred by his neurologist, [name], MD, for a neuropsychological consultation due to progressively worsening cognitive difficulties and motor changes. He presents with increasing difficulties in both long-term and short-term memory, episodes of acute confusion, bilateral tremors, and gait disturbance. Functionally, he has experienced a lean to the right and right-sided tracking while walking, with balance remaining problematic. He has also had a rightward drift when driving, which has led him to cease driving. Cueing has some impact on memory. He has experienced episodes of confusion with understanding situations and gets easily confused with some episodic worsening but a progressive worsening overall. Past medical history includes bipolar affective disorder.  His primary care physician initiated Parkinson's medications with some reported benefit. Aricept  was also trialed without significant side effects. There is no family history of progressive degenerative conditions. He has experienced freezing episodes, described as his body not responding to commands to move. He has a history of obstructive sleep apnea but was evaluated by Dr. Gores and found not to have the condition.   Medical History:                         Past Medical History:  Diagnosis Date   Allergy     Bipolar 1 disorder (HCC)      GERD (gastroesophageal reflux disease)     Hyperlipidemia     Hypertension                                                                 Patient Active Problem List    Diagnosis Date Noted   Tremor of both hands 04/20/2023   History of bipolar disorder 04/20/2023   Short-term memory loss 04/20/2023   Cognitive attention deficit 04/20/2023   Ataxia involving legs 04/20/2023   Upper respiratory tract infection 08/17/2020   Vitamin D  deficiency 04/15/2019   BMI 32.0-32.9,adult 04/15/2019   Hypertension 09/16/2013   Bipolar disorder (HCC) 03/11/2013   Allergic rhinitis 03/11/2013   BPH (benign prostatic hyperplasia) 03/11/2013   Hyperlipidemia 05/29/2007   Depression, recurrent (HCC) 05/29/2007   Sinusitis, chronic 05/29/2007   ESOPHAGEAL STRICTURE 05/29/2007   GERD 05/29/2007   HIATAL HERNIA 05/29/2007   LOW BACK PAIN, CHRONIC 05/29/2007      Onset and Duration of Symptoms:  Symptoms were first noticed by the individual after a knee replacement surgery in May of 2023, with progressive worsening of short-term memory and acute confusion. His wife, however, reports an earlier onset in 2022, when she first noticed periods of confusion and his need to write everything down. The tremor in his right hand developed while he  was still working. He stopped working in January of 2023.   Progression of Symptoms:  Symptoms have been slowly progressive. Tremors that began in the right hand have now progressed to involve both hands. Gait has worsened, with increased shuffling, a forward lean, and a directional change to the right. Balance issues are significant, with a fall or near-fall occurring almost daily. Mood has been reported as stable with current psychiatric interventions.   Additional Tests and Measures from other records:   Neuroimaging Results:  A brain DaTscan  was within normal limits, with no indication of reduced radiotracer activity in the basal ganglia to suggest Parkinson's  syndrome pathology. A brain MRI in January of 2025 was unremarkable, showing minimal, age-appropriate chronic small vessel disease with no structural lesions, tumors, or acute process. A CT of the head in May of 2025 identified generalized atrophy and the previously noted microvascular disease, with no acute abnormalities.    Laboratory Tests:  Lab work has shown valproic acid  to be at target range. A CSF culture was negative. General blood work has been within normal limits. An ATN profile in January of 2025 showed a high p-tau 181 concentration but normal beta-amyloid 42/40 ratio and normal NFL concentration, results not associated with an increased risk for Alzheimer's type pathology    Sleep:  He has experienced some difficulty sleeping, primarily when symptoms first began. He generally sleeps well through the night currently.  Tests Administered: Controlled Oral Word Association Test (COWAT; FAS & Animals)  Finger Tapping Test (FTT) Grooved Pegboard Wechsler Adult Intelligence Scale, 4th Edition (WAIS-IV) Wechsler Memory Scale, 4th Edition (WMS-IV); Adult Battery  [place]  Card Sorting Test [facility])  Participation Level:   Active  Participation Quality:  Appropriate      Behavioral Observation:  The patient appeared well-groomed and appropriately dressed. His manners were polite and appropriate to the situation. He ambulated using a walker and utilized hearing aids for the duration of testing. His attitude towards testing was positive and his effort was good.   Well Groomed, Alert, and Appropriate.   Test Results:   Initially, an estimation was made as to the patient's premorbid intellectual and cognitive functioning to provide a comparison point to assess current objective neuropsychological test data.  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  Patient always did very well in  science related classes but did acknowledge a relative weakness in math.  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  It is estimated that the patient is likely performed in the average to high average range as far as global cognitive abilities morbidly with likely individual strengths and aspects of visual-spatial, reasoning and problem-solving and information processing speed capacities.  The patient clearly has a premorbid history of good memory capacity to be able to perform the job duties he has effectively done for more than 40 years.  Secondly, consideration as to the validity of the current assessment was made.  The patient appeared to try his hardest throughout and demonstrated good effort and persistent throughout the extended testing visit.  Embedded validity checks suggest good effort throughout as well and this does appear to be a fair and valid assessment of the patient's current status.  COWAT:  FAS= 25 Z= -0.99 Animals=  25 Z= 1.57  As the patient has described changes and worsening in information processing speed and retrieval of information he was administered the control or word association test which looks at expressive language fluency components.  On the FAS subtest, which assesses lexical fluency the patient produced 25 total words in the time allotted which is right at 1 standard deviation below normative expectations when compared for age, gender and education variables.  However, on the animal naming subtest, which assesses semantic fluency the patient did much better producing 25 words in the allotted time which is 1-1/2 standard deviation above normative comparison.  Targeted challenge naming appears to be much better than free recall and self  produced lexical fluency.  FTT: R (DH) Average= 22 Percentile Rank=15th L (NDH) Average=25.8 Percentile Rank=15th    Grooved Pegboard:  *Tremor observed on both hands, more prominent on R (DH)  R (DH) time= 266s Percentile Rank= 15th Drops= 3  L (NDH) time= 171s Percentile Rank= 27th  Drops= 3   As the patient describes changes in gait and motor control with noted tremors he was administered both the finger tapping test and grooved pegboard test to look at both fine motor speed and fine motor control as well as considering any possible lateralization of findings.  On the finger tapping Test the patient performed in the mildly impaired range relative to normative expectations that were consistent for right dominant hand and left dominant hand where he performed at the 15th percentile for both hands.  A similar pattern was also noted on measures of fine motor control.  Tremor was observed in both hands during this measure and were noted as being more prominent for his right dominant hand.  The patient showed 3 drops of the keys for both his right and left hands during this test and showed significant slowing and overall completion of the task.  He had greater difficulties for his right dominant hand versus left dominant hand suggesting some subtle differences between fine motor control and completed the tasks more quickly with his nondominant hand consistent with observing of greater tremors for his right hand.  This pattern would be consistent with patient's reports of changes in motor functioning and gait.  It would not suggest any strong findings of lateralization of difficulties but clearly right hand greater than left hand for fine motor control difficulties and tremors.  WAIS-IV:            Composite Score Summary          Scale Sum of Scaled Scores Composite Score Percentile Rank 95% Conf. Interval Qualitative Description  Verbal Comprehension 33 VCI 105 63 99-110 Average   Perceptual Reasoning 18 PRI 77 6 72-84 Borderline  Working Memory 14 WMI 83 13 77-91 Low Average  Processing Speed 9 PSI 71 3 66-82 Borderline  Full Scale 74 FSIQ 82 12 78-86 Low Average  General Ability 51 GAI 91 27 86-96 Average    In order to provide a thorough assessment of a broader range of intellectual and cognitive domains in a structured repeatable well normed and standardized measure the patient was administered the Wechsler Adult Intelligence Scale-IV in its entirety.  Given the fact that the patient and family are reporting significant change in his day-to-day functioning and overall cognitive capacity the current score should not be seen as reflexive of his lifelong capacity but it descriptor of his current status.  2 Global composite scores were calculated with this battery.  The patient produced a full-scale  IQ score of 82 which falls at the 12 percentile and is in the low average range relative to a normative population.  This is approaching 2 standard deviations below premorbid estimates of average to high average premorbid intellectual and cognitive functioning and suggest multiple domains of cognitive functioning being negatively impaired.  A second composite score placing less emphasis on attentional variables including auditory encoding and focus execute abilities was also calculated.  The patient produced a general abilities index score of 91 which falls at the 27th percentile and is in the lower end of the average range.  This is still likely to be more than 1 standard deviation below premorbid estimates indicating change overall from premorbid capacity but this difference between full scale and general abilities index scores would also suggest that issues related to attention and information processing speed are significantly involved in his cognitive deficits.  Looking at other global composite abilities the patient had his best performance on measures of verbal comprehension  and language based cognitive skills where he produced a performance at the 63rd percentile and much more consistent with premorbid estimates.  These measures are also more likely to be stable components of cognitive functioning versus some of the other elements.  The patient displayed his greatest deficits relative to premorbid estimates for visual-spatial and visual reasoning and problem-solving, information processing speed/focus execute abilities and auditory encoding components.          Verbal Comprehension Subtests Summary        Subtest Raw Score Scaled Score Percentile Rank Reference Group Scaled Score SEM  Similarities 27 11 63 11 1.08  Vocabulary 40 10 50 11 0.73  Information 19 12 75 14 0.67   The patient produced a verbal comprehension index score of 105 which falls at the 63rd percentile is in the average range.  There is little to no scatter in subtest performance with the patient performing consistent with premorbid intellectual cognitive functional measures of verbal reasoning and problem-solving, vocabulary knowledge and general fund of information.  Perceptual Reasoning Subtests Summary        Subtest Raw Score Scaled Score Percentile Rank Reference Group Scaled Score SEM  Block Design 20 6 9 5  1.04  Matrix Reasoning 6 5 5 3  0.95  Visual Puzzles 8 7 16 6  0.99   The patient produced a perceptual reasoning index score of 77 which falls at the 6 percentile and is in the borderline range relative to normative population.  This is significantly below premorbid estimates and even more significantly below predictions based on his occupational history where visual-spatial and visual reasoning skills are likely at a premium.  Patient showed significant deficits with regard to visual analysis and organizational skills, visual reasoning and problem-solving abilities, the capacity to produce hold-part visual analysis, and visual estimation and predictive capacities.  Working Engineer, petroleum Raw Score Scaled Score Percentile Rank Reference Group Scaled Score SEM  Digit Span 20 7 16 6  0.85  Arithmetic 10 7 16 7  1.04   The patient produced a working memory index score of 83 which falls at the 13th percentile and is in the low average range relative to a normative population.  There was consistencies between subtest performance indicating patterns consistent with significant change in his auditory encoding capacity as well as his ability to actively process information in his auditory Register.  This level of deficits around encoding capacity would be consistent with reports of memory difficulties  as encoding as a prerequisite for capacity to store and organize new information.  Processing Speed Subtests Summary        Subtest Raw Score Scaled Score Percentile Rank Reference Group Scaled Score SEM  Symbol Search 19 6 9 5  1.31  Coding 25 3 1 2  0.99    The patient produced a processing speed index score of 71 which falls to 3rd percentile and is in the borderline range relative to a normative population.  There was some significant scatter in subtest performance but both subtest were significantly below estimates of premorbid functioning particularly taking into account his occupational history.  The patient had significant difficulty with visual scanning, visual searching and overall speed of mental operations with significant deficits in focus execute capacity that did require visual motor component to it.    WMS-IV:          Index Score Summary        Index Sum of Scaled Scores Index Score Percentile Rank 95% Confidence Interval Qualitative Descriptor  Auditory Memory (AMI) 27 81 10 76-88 Low Average  Visual Memory (VMI) 20 69 2 65-76 Extremely Low  Visual Working Memory (VWMI) 9 67 1 62-77 Extremely Low  Immediate Memory (IMI) 21 69 2 64-77 Extremely Low  Delayed Memory (DMI) 26 76 5 71-84 Borderline    In order to objectively assess a wide range of  memory and learning components in a structured well normed repeatable battery the patient was administered the Wechsler Memory Scale-IV.  Again, given the patient's educational and occupational history he likely had very good memory and learning capacities relative to a normative population premorbidly.  On the Wechsler Adult Intelligence Scale the patient showed moderate weaknesses and deficits with regard to auditory encoding and his capacity to actively process information in his auditory Register relative to a normative population and likely significant changes from premorbid capacity.  The patient showed a similar type of pattern for his visual encoding capacity on the Wechsler Memory Scale and particular deficits for his capacity to actively process information in his visual Register.  These encoding deficits both auditorily and visually will have a significant impact on capacity to store and organize both auditory and visual type information.  Breaking memory functions down between auditory versus visual the patient produced an auditory memory index score of 81 which falls at the 10th percentile is a low average range.  The patient had even greater difficulties with regard to visual memory where he produced a visual memory index score of 69 falling at the 2nd percentile and in the extremely low range relative to normal population.  Even taken into account the patient's weaknesses with regard to auditory and visual encoding these are significantly impaired particularly with regard to visual memory but both of these components are well below expected levels of premorbid functioning suggesting significant change in this area.  Breaking memory and learning components down between immediate versus delayed the patient produced an immediate memory index score of 69 which falls at the 2nd percentile in the extremely low range relative to normative population.  The patient did somewhat better on delayed memory  functions but still well below predicted levels of premorbid functioning.  The very limited amount of information that the patient initially is able to store, organize and retrieve appears to be maintained.  The patient did show some improvements under recognition/cued recall types of settings.  The overall pattern of cognitive strengths and weaknesses with regard to memory capacity does suggest  that there are significant deficits with regard to auditory and visual encoding, and significant deficits around focus execute/information processing speed having a the primary impact on capacity to store and organize new information.  The patient does appear to be able to maintain the limited amount of information he initially stores and does improve under recognitions type formats.  This would suggest a combination of encoding deficits and retrieval deficits are the primary components of his observed significant memory difficulties.  These patterns are consistent with subjective reports and descriptions by the patient.         Primary Subtest Scaled Score Summary       Subtest Domain Raw Score Scaled Score Percentile Rank  Logical Memory I AM 21 8 25   Logical Memory II AM 15 7 16   Verbal Paired Associates I AM 11 5 5   Verbal Paired Associates II AM 6 7 16   Designs I VM 36 2 0.4  Designs II VM 36 6 9  Visual Reproduction I VM 26 6 9   Visual Reproduction II VM 8 6 9   Spatial Addition VWM 7 7 16   Symbol Span VWM 3 2 0.4          Auditory Memory Process Score Summary      Process Score Raw Score Scaled Score Percentile Rank Cumulative Percentage (Base Rate)  LM II Recognition 24 - - 26-50%  VPA II Recognition 37 - - 26-50%         Visual Memory Process Score Summary      Process Score Raw Score Scaled Score Percentile Rank Cumulative Percentage (Base Rate)  DE I Content 22 4 2  -  DE I Spatial 8 3 1  -  DE II Content 26 7 16  -  DE II Spatial 10 9 37 -  DE II Recognition 14 - - 26-50%  VR II  Recognition 5 - - 26-50%    ABILITY-MEMORY ANALYSIS   Ability Score:    VCI: 105 Date of Testing:           WAIS-IV; WMS-IV 2023/09/04             Predicted Difference Method    Index Predicted WMS-IV Index Score Actual WMS-IV Index Score Difference Critical Value   Significant Difference Y/N Base Rate  Auditory Memory 103 81 22 9.00 Y 5%  Visual Memory 102 69 33 8.38 Y <1%  Visual Working Memory 103 67 36 10.86 Y <1%  Immediate Memory 103 69 34 10.12 Y <1%  Delayed Memory 103 76 27 9.95 Y 2%  Statistical significance (critical value) at the .01 level.              WCST:             [place]  Card Sorting Test (WCST)                    Total Errors     t = 31 3 %ile Below Average    Perseverative Errors     t = 35 7 %ile Below Average    Non-Perseverative Errors     t = 31 3 %ile Below Average    % Conceptual Responses     t = 32 4 %ile Below Average    Categories Completed           6 to 10 %ile Low to Below Average    Trials to First Category Complete           >16 %  ile WNL    Failure to Maintain Set           >16 %ile WNL   As the patient has been describing difficulties with reasoning and problem-solving, the patient was administered the [place]  card sorting test.  The patient only showed mild deficits with regard to visual reasoning and problem-solving types of components and had a mild increase in errors of both a perseverative and nonperseverative type but was able to complete the task with only mild deficits although this would be below predicted levels of premorbid functioning.  Summary of Results:   Overall, the results of the current objective neuropsychological assessment do show global decreases in overall cognitive functioning relative to premorbid estimates.  The patient shows significant deficits with regard to visual analysis and visual reasoning capacities, visual-spatial and visual constructional capacities, significant weaknesses and information processing  speed and focus execute abilities, significant weaknesses relative to premorbid estimates on measures of lexical fluency but not semantic fluency/challenge naming, changes in both fine motor speed and fine motor coordination that was generally consistent between her right dominant hand and left dominant hand although greater tremors and fine motor control deficits for right dominant hand, mild weaknesses with regard to reasoning and problem-solving primarily visual reasoning and problem-solving capacity and significant deficits for both auditory and visual learning.  Careful analysis of attention and memory components suggest that this is true for both visual and auditory memory with the primary causative factors of memory and learning deficits related to both auditory and visual encoding changes in information processing speed's as well as retrieval of information.  This does not appear to be a primary storage deficit type memory impairment but more related to attentional components and retrieval of information that would be consistent with the type of changes in lexical fluency versus semantic/targeted naming verbal changes.  The patient is maintaining his longstanding knowledge including general fund of information and is still able to effectively reasoning and problem solve verbally versus visually.  Impression/Diagnosis:   Overall, the results of the current neuropsychological evaluation are not consistent with patterns typically seen with Alzheimer's type pathology.  The patient's significant change in gait, bilateral hand tremors although right greater than left, significant changes in information processing speed and changes in subcortical types of cognitive elements related to both auditory and visual encoding are all noted.  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  Blood work studies  including ATN profile were not consistent with an Alzheimer's type pathology but the patient did have elevation levels of P tau 181 with normal beta amyloid 42/40 ratio.  Brain DaTscan  was within normal limits and not consistent with a Parkinson's type presentation and while the patient has shown some mild microvascular ischemic/small vessel changes these are not significant enough to explain his current presentation.  Tremors bilaterally, significant gait changes and motor functioning changes, significant changes in overall cognitive functioning particularly related to information processing speed, auditory and visual encoding deficits, geographic/visual spatial changes are all noted.  As far as diagnostic considerations, the above noted information and cognitive strengths and weaknesses as well as some described improvements after lumbar puncture would all be much more consistent with patterns typically seen with normal pressure hydrocephalus and would not typically be seen consistent with manifestations of his longstanding bipolar affective disorder or current medication regimen.  The patient is no longer taking some of the previous  mood stabilizing agents such as lithium  but does continue to have a prescription for Depakote  noted.  While the patient has taking Aricept  and Sinemet  without particular adverse response and some potential mild improvements the current pattern is much more consistent with Normal pressure hydrocephalus syndrome.  The patient is treating neurologist has already been considering this possibility and he has follow-up visits with neurology coming up.  The patient's clinical presentation, cognitive strengths and weaknesses and laboratory studies would not be consistent with an Alzheimer's type pathology or a Parkinson's type pathology and DaTscan  would also not be consistent with a Parkinson's type pathology.  Diagnosis:    Cognitive and neurobehavioral dysfunction  Short-term  memory loss  Normal pressure hydrocephalus syndrome (HCC)   _____________________ Norleen Asa, Psy.D. Clinical Neuropsychologist

## 2024-01-16 NOTE — Progress Notes (Signed)
 Neuropsychological Evaluation   Patient:  Melvin Roberts   DOB: March 08, 1961  MR Number: [id number]  Location: [facility] [address] [place], [address] [place] [address] Dept: [phone]  Start: 9 AM End: 10 AM  Provider/Observer:     Norleen JONELLE Asa PsyD  Chief Complaint:      Chief Complaint  Patient presents with   Gait Problem   Memory Loss   Other    Normal pressure hydrocephalus syndrome (HCC)    12/05/2023: Today I provided feedback regarding the results of the recent neuropsychological evaluation.  We went over the results and the fact that the patient is returning to see Dr. Dohmeier/neurology for follow-up in September and that the results of the recent neuropsychological evaluation and clinical presentation and overall medical workup does appear to be consistent with a possible normal pressure hydrocephalus further workup and consideration is needed by neurology.  The neuropsychological test data is not consistent with an Alzheimer's type pathology and not generally consistent with a Parkinson's type pathology but there are consistencies with NPH.  The patient has a longstanding diagnosis of bipolar affective disorder but his clinical pattern and cognitive strengths and weaknesses would not suggest that this would be a explanation of his clinical presentation or neuropsychological test data.  I have included a copy of the reason for service and summary of the neuropsychological test data below for convenience and the full neuropsychological assessment can be found in the patient's EMR dated 11/30/2023.  Reason For Service:     Melvin Roberts is a 63 year old male is referred by his neurologist, [name], MD, for a neuropsychological consultation due to progressively worsening cognitive difficulties and motor changes. He presents with increasing difficulties in  both long-term and short-term memory, episodes of acute confusion, bilateral tremors, and gait disturbance. Functionally, he has experienced a lean to the right and right-sided tracking while walking, with balance remaining problematic. He has also had a rightward drift when driving, which has led him to cease driving. Cueing has some impact on memory. He has experienced episodes of confusion with understanding situations and gets easily confused with some episodic worsening but a progressive worsening overall. Past medical history includes bipolar affective disorder.  His primary care physician initiated Parkinson's medications with some reported benefit. Aricept  was also trialed without significant side effects. There is no family history of progressive degenerative conditions. He has experienced freezing episodes, described as his body not responding to commands to move. He has a history of obstructive sleep apnea but was evaluated by Dr. Gores and found not to have the condition.    Impression/Diagnosis:   Overall, the results of the current neuropsychological evaluation are not consistent with patterns typically seen with Alzheimer's type pathology.  The patient's significant change in gait, bilateral hand tremors although right greater than left, significant changes in information processing speed and changes in subcortical types of cognitive elements related to both auditory and visual encoding are all noted.  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  Blood work studies including ATN profile were not consistent with an Alzheimer's type pathology but the patient did have elevation levels of P tau 181 with normal beta amyloid 42/40 ratio.  Brain DaTscan  was within normal limits and not consistent with a Parkinson's type presentation and while the patient has shown  some mild microvascular  ischemic/small vessel changes these are not significant enough to explain his current presentation.  Tremors bilaterally, significant gait changes and motor functioning changes, significant changes in overall cognitive functioning particularly related to information processing speed, auditory and visual encoding deficits, geographic/visual spatial changes are all noted.  As far as diagnostic considerations, the above noted information and cognitive strengths and weaknesses as well as some described improvements after lumbar puncture would all be much more consistent with patterns typically seen with normal pressure hydrocephalus and would not typically be seen consistent with manifestations of his longstanding bipolar affective disorder or current medication regimen.  The patient is no longer taking some of the previous mood stabilizing agents such as lithium  but does continue to have a prescription for Depakote  noted.  While the patient has taken Aricept  and Sinemet  without particular adverse response and some potential mild improvements the current pattern is much more consistent with Normal pressure hydrocephalus syndrome.  The patient's treating neurologist has already been considering this possibility and he has follow-up visits with neurology coming up.  The patient's clinical presentation, cognitive strengths and weaknesses and laboratory studies would not be consistent with an Alzheimer's type pathology or a Parkinson's type pathology and DaTscan  would also not be consistent with a Parkinson's type pathology.  Diagnosis:    Cognitive and neurobehavioral dysfunction  Short-term memory loss  Normal pressure hydrocephalus syndrome (HCC)  Bipolar affective disorder, remission status unspecified (HCC)   _____________________ Norleen Asa, Psy.D. Clinical Neuropsychologist

## 2024-01-18 ENCOUNTER — Ambulatory Visit (INDEPENDENT_AMBULATORY_CARE_PROVIDER_SITE_OTHER): Admitting: Family Medicine

## 2024-01-18 ENCOUNTER — Encounter: Payer: Self-pay | Admitting: Family Medicine

## 2024-01-18 VITALS — BP 104/68 | HR 68 | Temp 97.8°F | Ht 71.0 in | Wt 230.0 lb

## 2024-01-18 DIAGNOSIS — G912 (Idiopathic) normal pressure hydrocephalus: Secondary | ICD-10-CM

## 2024-01-18 DIAGNOSIS — G20C Parkinsonism, unspecified: Secondary | ICD-10-CM | POA: Diagnosis not present

## 2024-01-18 DIAGNOSIS — J0110 Acute frontal sinusitis, unspecified: Secondary | ICD-10-CM | POA: Diagnosis not present

## 2024-01-18 DIAGNOSIS — F3172 Bipolar disorder, in full remission, most recent episode hypomanic: Secondary | ICD-10-CM | POA: Diagnosis not present

## 2024-01-18 MED ORDER — BETAMETHASONE SOD PHOS & ACET 6 (3-3) MG/ML IJ SUSP
6.0000 mg | Freq: Once | INTRAMUSCULAR | Status: AC
  Administered 2024-01-18: 6 mg via INTRAMUSCULAR

## 2024-01-18 MED ORDER — DONEPEZIL HCL 5 MG PO TABS: 5.0000 mg | ORAL_TABLET | Freq: Every day | ORAL | 0 refills | Status: AC

## 2024-01-18 MED ORDER — AMOXICILLIN-POT CLAVULANATE 875-125 MG PO TABS: 1.0000 | ORAL_TABLET | Freq: Two times a day (BID) | ORAL | 0 refills | Status: DC

## 2024-01-18 MED ORDER — PSEUDOEPHEDRINE-GUAIFENESIN ER 120-1200 MG PO TB12: 1.0000 | ORAL_TABLET | Freq: Two times a day (BID) | ORAL | 0 refills | Status: DC

## 2024-01-18 NOTE — Progress Notes (Signed)
 Subjective:  Patient ID: Melvin Roberts, male    DOB: Jul 13, 1960  Age: 63 y.o. MRN: [id number]  CC: Sinusitis   HPI  Discussed the use of AI scribe software for clinical note transcription with the patient, who gave verbal consent to proceed.  History of Present Illness Melvin Roberts is a 63 year old male who presents with sinus congestion and facial pain following dust exposure.  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 Symptoms include significant nasal congestion, headaches, and facial pain localized to the forehead and nose, described as 'almost like somebody punched me in the nose.' No cheek pain is reported. No fever or earache, but he notes difficulty hearing due to congestion. Nighttime coughing and sneezing have been present.  He has been using over-the-counter medications, initially trying cortisidine without relief, and then switching to Alka-Seltzer sinus and allergy medication. He reports significant nasal discharge, using paper towels due to the volume and consistency of the mucus, which is described as 'gross stuff.' He also experiences post-nasal drip, leading to throat clearing and expectoration of green or yellow mucus.  In addition to the sinus issues, he has a history of being evaluated for neurological symptoms initially thought to be Parkinson's disease or bipolar disorder. Recent evaluations suggest normal pressure hydrocephalus, despite a negative initial scan. He has been tapering off carbidopa -levodopa , initially reducing to two pills a day, but returned to three due to increased festination and a concurrent sinus infection. No improvement in symptoms is noted regardless of medication adjustments.          01/18/2024   10:40 AM 12/21/2023   11:01 AM 09/20/2023    2:46 PM  Depression screen PHQ 2/9  Decreased Interest 1 1 0   Down, Depressed, Hopeless 1 0 0  PHQ - 2 Score 2 1 0  Altered sleeping 1 1 1   Tired, decreased energy 1 1 1   Change in appetite 0 0 0  Feeling bad or failure about yourself  1 1 0  Trouble concentrating 1 1 1   Moving slowly or fidgety/restless 0 1 0  Suicidal thoughts 0 0 0  PHQ-9 Score 6 6 3   Difficult doing work/chores Not difficult at all Not difficult at all     History Melvin Roberts has a past medical history of Allergy, Bipolar 1 disorder (HCC), GERD (gastroesophageal reflux disease), Hyperlipidemia, and Hypertension.   He has a past surgical history that includes Back surgery (11/17/1995); Hernia repair (04/19/2007); Eye surgery; Upper gastrointestinal endoscopy (x2); and Colonoscopy.   His family history includes Cancer in his father, maternal grandfather, mother, and paternal grandfather; Esophageal cancer in his maternal grandfather.He reports that he has never smoked. He has never used smokeless tobacco. He reports that he does not currently use alcohol. He reports that he does not use drugs.    ROS Review of Systems  Objective:  BP 104/68   Pulse 68   Temp 97.8 F (36.6 C)   Ht 5' 11 (1.803 m)   Wt 230 lb (104.3 kg)   SpO2 96%   BMI 32.08 kg/m   BP Readings from Last 3 Encounters:  01/18/24 104/68  01/08/24 120/80  12/21/23 113/73    Wt Readings from Last 3 Encounters:  01/18/24 230 lb (104.3 kg)  01/08/24 235 lb (106.6 kg)  12/21/23 233 lb (105.7 kg)     Physical Exam  Physical Exam GENERAL: Alert, cooperative, well developed, no acute distress. HEENT: Normocephalic, normal oropharynx, moist mucous membranes. Nasal passages swollen and red. Mild tenderness over forehead sinuses. No tenderness over cheek sinuses. Hearing diminished due to congestion. CHEST: Clear to auscultation bilaterally. No wheezes, rhonchi, or crackles. CARDIOVASCULAR: Normal heart rate and rhythm, S1 and S2 normal without murmurs. ABDOMEN: Soft, non-tender, non-distended, without  organomegaly. Normal bowel sounds. EXTREMITIES: No cyanosis or edema. NEUROLOGICAL: Cranial nerves grossly intact, moves all extremities without gross motor or sensory deficit.   Assessment & Plan:  NPH (normal pressure hydrocephalus) (HCC)  Acute non-recurrent frontal sinusitis -     Betamethasone  Sod Phos & Acet  Parkinsonism, unspecified Parkinsonism type (HCC)  Bipolar disorder, in full remission, most recent episode hypomanic  Other orders -     Amoxicillin -Pot Clavulanate; Take 1 tablet by mouth 2 (two) times daily. Take all of this medication  Dispense: 20 tablet; Refill: 0 -     Pseudoephedrine-guaiFENesin  ER; Take 1 tablet by mouth 2 (two) times daily. For congestion  Dispense: 14 tablet; Refill: 0    Assessment and Plan Assessment & Plan Acute sinusitis   Acute sinusitis is likely due to recent exposure to dust and dirt, presenting with nasal congestion, facial pain, headaches, and purulent nasal discharge, worsening at night. There is no fever or earache. Examination shows swollen, red nasal passages, tenderness over forehead sinuses, and pressure in eustachian tubes. Administer a corticosteroid injection to reduce inflammation and congestion. Prescribe Augmentin  for antibiotic therapy and Mucinex  D for decongestion, to be taken in the morning and possibly mid-afternoon to avoid sleep disturbances. Consider Nyquil at bedtime if Mucinex  D causes insomnia.  Normal pressure hydrocephalus   Ongoing evaluation for normal pressure hydrocephalus continues. A previous scan was negative, but a dedicated scan is planned. Symptoms include balance issues and fatigue. A potential diagnosis could lead to treatment options such as a stent to improve symptoms.  Parkinsonism, unspecified type   The current diagnosis of Parkinsonism is under reconsideration. A specialist suggests it may not be Parkinson's disease. A tapering of carbidopa -levodopa  was attempted but resulted in increased  festination, leading to resumption of the original dosage. Symptoms remain unchanged regardless of medication adjustments.       Follow-up: No follow-ups on file.  [name], M.D.

## 2024-01-21 ENCOUNTER — Encounter: Payer: Self-pay | Admitting: Family Medicine

## 2024-01-24 ENCOUNTER — Ambulatory Visit (HOSPITAL_COMMUNITY)
Admission: RE | Admit: 2024-01-24 | Discharge: 2024-01-24 | Disposition: A | Discharge status: Home / Self Care | Source: Ambulatory Visit | Attending: Neurology | Admitting: Neurology

## 2024-01-24 ENCOUNTER — Other Ambulatory Visit: Payer: Self-pay

## 2024-01-24 ENCOUNTER — Ambulatory Visit (HOSPITAL_COMMUNITY)
Admission: RE | Admit: 2024-01-24 | Discharge: 2024-01-24 | Disposition: A | Source: Ambulatory Visit | Attending: Family Medicine | Admitting: Family Medicine

## 2024-01-24 DIAGNOSIS — G912 (Idiopathic) normal pressure hydrocephalus: Secondary | ICD-10-CM | POA: Diagnosis present

## 2024-01-24 LAB — PROTEIN AND GLUCOSE, CSF
Glucose, CSF: 56 mg/dL (ref 40–70)
Total  Protein, CSF: 48 mg/dL — ABNORMAL HIGH (ref 15–45)

## 2024-01-24 LAB — CSF CELL COUNT WITH DIFFERENTIAL
RBC Count, CSF: 2875 /mm3 — ABNORMAL HIGH
Tube #: 1
WBC, CSF: 1 /mm3 (ref 0–5)

## 2024-01-24 MED ORDER — LIDOCAINE 1 % OPTIME INJ - NO CHARGE
2.0000 mL | Freq: Once | INTRAMUSCULAR | Status: AC
  Administered 2024-01-24: 5 mL via INTRADERMAL
  Filled 2024-01-24: qty 2

## 2024-01-24 MED ORDER — ACETAMINOPHEN 500 MG PO TABS: 1000.0000 mg | ORAL_TABLET | Freq: Once | ORAL | Status: DC | PRN

## 2024-01-24 NOTE — Discharge Instructions (Signed)
Myelogram and Lumbar Puncture Discharge Instructions  Go home and rest quietly for the next 24 hours.  It is important to lie flat for the next 24 hours.  Get up only to go to the restroom.  You may lie in the bed or on a couch on your back, your stomach, your left side or your right side.  You may have one pillow under your head.  You may have pillows between your knees while you are on your side or under your knees while you are on your back.  DO NOT drive today.  Recline the seat as far back as it will go, while still wearing your seat belt, on the way home.  You may get up to go to the bathroom as needed.  You may sit up for 10 minutes to eat.  You may resume your normal diet and medications unless otherwise indicated.  The incidence of headache, nausea, or vomiting is about 5% (one in 20 patients).  If you develop a headache, lie flat and drink plenty of fluids until the headache goes away.  Caffeinated beverages may be helpful.  If you develop severe nausea and vomiting or a headache that does not go away with flat bed rest, call [phone].  You may resume normal activities after your 24 hours of bed rest is over; however, do not exert yourself strongly or do any heavy lifting tomorrow.  Call your physician for a follow-up appointment.  The results of your myelogram will be sent directly to your physician by the following day.  Discharge instructions have been explained to the patient.  The patient, or the person responsible for the patient, fully understands these instructions.   

## 2024-01-24 NOTE — Procedures (Signed)
    PROCEDURE SUMMARY:   Successful image-guided lumbar puncture at level of L3/4 Opening pressure 11 cm water.  Yielded 30 mL of clear  CSF.  No immediate complications.  EBL = trace. Patient tolerated well.    Specimen was sent for labs.   Please see imaging section of Epic for full dictation.   Post procedure orders placed.  - Bedrest with bathroom privileges for 2 hours  - Remain flat in the bed for rest of the day  - Tylenol for headache, up to 4,000 mg a day

## 2024-01-26 ENCOUNTER — Ambulatory Visit: Payer: Self-pay | Admitting: Neurology

## 2024-01-26 ENCOUNTER — Ambulatory Visit: Payer: Self-pay

## 2024-01-26 NOTE — Telephone Encounter (Signed)
 FYI Only or Action Required?: FYI only for provider.  Patient was last seen in primary care on 01/18/2024 by Zollie Lowers, MD.  Called Nurse Triage reporting Sinusitis.  Symptoms began 1-2 weeks ago.  Interventions attempted: Prescription medications: Augmentin .  Symptoms are: unchanged.  Triage Disposition: See PCP When Office is Open (Within 3 Days)  Patient/caregiver understands and will follow disposition?: Yes      Copied from CRM [id number]. Topic: Clinical - Medication Question >> Jan 26, 2024  8:55 AM Melvin Roberts wrote: Reason for CRM: pt is calling to speak with MD stacks nurse about getting an antibiotic that's stronger. Stated his sinus infection is getting worse but has no other symptoms. He currently take the amoxicillin -clavulanate (AUGMENTIN ) 875-125 MG tablet [[id number]].    Please advise [phone]     Reason for Disposition  [1] Taking antibiotic > 7 days AND [2] nasal discharge not improved  Answer Assessment - Initial Assessment Questions 1. ANTIBIOTIC: What antibiotic are you taking? How many times a day?     Augmentin   2. ONSET: When was the antibiotic started?     8 days ago  3. PAIN: How bad is the pain?   (Scale 0-10; or none, mild, moderate or severe)     8/10 4. FEVER: Do you have a fever? If Yes, ask: What is it, how was it measured, and when did it start?      No 5. SYMPTOMS: Are there any other symptoms you're concerned about? If Yes, ask: When did it start?     Cough, sneezing, nasal congestion  Protocols used: Sinus Infection on Antibiotic Follow-up Call-[facility]

## 2024-01-26 NOTE — Telephone Encounter (Signed)
 Apt scheduled.

## 2024-01-27 LAB — CSF CULTURE W GRAM STAIN: Culture: NO GROWTH

## 2024-01-27 LAB — OLIGOCLONAL BANDS, CSF + SERM

## 2024-01-29 ENCOUNTER — Ambulatory Visit (INDEPENDENT_AMBULATORY_CARE_PROVIDER_SITE_OTHER): Admitting: Family Medicine

## 2024-01-29 ENCOUNTER — Ambulatory Visit (INDEPENDENT_AMBULATORY_CARE_PROVIDER_SITE_OTHER): Admitting: Adult Health

## 2024-01-29 ENCOUNTER — Encounter: Payer: Self-pay | Admitting: Adult Health

## 2024-01-29 ENCOUNTER — Encounter: Payer: Self-pay | Admitting: Family Medicine

## 2024-01-29 VITALS — BP 120/72 | HR 63 | Temp 98.2°F | Ht 71.0 in | Wt 231.0 lb

## 2024-01-29 DIAGNOSIS — F319 Bipolar disorder, unspecified: Secondary | ICD-10-CM | POA: Diagnosis not present

## 2024-01-29 DIAGNOSIS — G4700 Insomnia, unspecified: Secondary | ICD-10-CM | POA: Diagnosis not present

## 2024-01-29 DIAGNOSIS — J0100 Acute maxillary sinusitis, unspecified: Secondary | ICD-10-CM

## 2024-01-29 DIAGNOSIS — F411 Generalized anxiety disorder: Secondary | ICD-10-CM

## 2024-01-29 MED ORDER — MOXIFLOXACIN HCL 400 MG PO TABS: 400.0000 mg | ORAL_TABLET | Freq: Every day | ORAL | 0 refills | Status: DC

## 2024-01-29 MED ORDER — PREDNISONE 10 MG PO TABS: ORAL_TABLET | ORAL | 0 refills | Status: DC

## 2024-01-29 NOTE — Telephone Encounter (Signed)
 I called and spoke to wife and relayed the results of the LP/ pre and post PT , relayed to her that was slight improvement in speed from pre and post LP, PT may lead to a similar improvement.  Risk of doing procedure outweigh the benefits seen here.  Wife verbalized understanding.  They were to start decreasing the sinemet , but he is now fighting sinus infection (augmentin  and now avelox).  Will wait till he if back to baseline then will start the taper of sinemet  .  She appreciated call back.

## 2024-01-29 NOTE — Telephone Encounter (Signed)
-----   Message from [place] Dohmeier sent at 01/26/2024 11:49 AM EDT ----- I reviewed the PT results, there is slight improvement in speed from pre to post LP function, but it remains slight.  Physical therapy may lead to a similar improvement or beyond , the procedure's  risks outweigh the benefits seen here.  ----- Message ----- From: Interface, [facility] Sent: 01/24/2024  12:02 PM EDT To: [name], MD

## 2024-01-29 NOTE — Progress Notes (Signed)
 Chief Complaint  Patient presents with   sinus, congestion    HPI  Patient presents today for Discussed the use of AI scribe software for clinical note transcription with the patient, who gave verbal consent to proceed.  History of Present Illness Melvin Roberts is a 63 year old male who presents with persistent sinus symptoms.  He has been experiencing ongoing sinus symptoms despite completing a ten-day course of antibiotics, which ended on Saturday. His symptoms include persistent coughing, post-nasal drainage, and congestion. He also experiences sinus headaches located across his forehead above the brow, which have improved since last Monday but remain present. He frequently blows his nose, producing thick yellow mucus.  He has a history of sinus problems, particularly exacerbated during this time of year. Previous treatments with amoxicillin  have been insufficient.  He has been taking Mucinex  DM daily, avoiding it at night, and uses Nyquil at night to aid sleep.  No sneezing.     PMH: Smoking status noted Review of Systems  Objective: BP 120/72   Pulse 63   Temp 98.2 F (36.8 C)   Ht 5' 11 (1.803 m)   Wt 231 lb (104.8 kg)   SpO2 95%   BMI 32.22 kg/m  Gen: NAD, alert, cooperative with exam HEENT: NCAT, EOMI, PERRL nasal passages swollen, red. Frontal sinuses tener to percussion CV: RRR, good S1/S2, no murmur Resp: bronchitic changes Ext: No edema, warm Neuro: Alert and oriented, No gross deficits  There are no diagnoses linked to this encounter.  Assessment and Plan Assessment & Plan Acute frontal sinusitis with persistent symptoms   He continues to experience symptoms of acute frontal sinusitis, such as post-nasal drainage, sinus headaches, and yellow nasal discharge, despite completing a course of antibiotics. Although symptoms are less severe, he persists. Start prednisone  with a regimen of 5 pills daily for 2 days, then taper by 1 pill every 2 days  over a 10-day course. Discussed potential side effects, including insomnia, and advised taking it early in the day with food to reduce gastrointestinal discomfort. Prescribe moxifloxacin (Avelox) due to persistent symptoms and previous ineffectiveness of amoxicillin . Discussed potential side effect of diarrhea and recommended probiotic use. Instruct to monitor for diarrhea; if experiencing 6 or more bowel movements a day, stop moxifloxacin and contact the provider. Discussed rare risk of C. difficile infection with high-potency antibiotics and advised on signs to watch for. Continue Mucinex  D for symptom relief.  Acute bronchitis   He has mild acute bronchitis with some bronchial sounds but no evidence of pneumonia. Symptoms include coughing and expectoration of yellow-green sputum. Continue Mucinex  DM during the day for cough management and Nyquil at night to aid sleep.      [name], MD

## 2024-01-29 NOTE — Progress Notes (Signed)
 Melvin Roberts [id number] May 04, 1960 63 y.o.  Subjective:   Patient ID:  Melvin Roberts is a 63 y.o. (DOB 02-Apr-1961) male.  Chief Complaint: No chief complaint on file.   HPI Melvin Roberts presents to the office today for follow-up of Bipolar Disorder, GAD and insomnia.  Accompanied by wife.   Describes mood today as ok. Mood symptoms - reports decreased depression - a little bit down. Reports ok interest and motivation when he feels better. Reports situational anxiety - mother - son. Reports situational irritability. Denies panic attacks. Reports some worry, over thinking and  rumination. Reports mood is stable. Stating I feel like I'm doing ok. Taking medications as prescribed. Energy levels vary. Active, does not have a regular exercise routine - walking more.  Enjoys some usual interests and activities. Married. Lives with wife. Has 5 children and 11 grandchildren. Spending time with family. Appetite adequate. Weight loss - 231 from pounds. Reports sleep consistent. Averages 7 to 8 hours. Reports decreased daytime napping. Reports focus and concentration difficulties. Managing minimal aspects of household. Completing tasks. Disabled. Denies SI or HI.  Denies AH or VH. Denies self harm. Denies substance use. Reports hearing loss.  Previous medication trials: Lithium , Seroquel    GAD-7    Flowsheet Row Office Visit from 01/29/2024 in [facility] Office Visit from 01/18/2024 in [facility] Office Visit from 12/21/2023 in [facility] Office Visit from 09/20/2023 in [facility] Office Visit from 09/05/2023 in [facility]  Total GAD-7 Score 5 2 2 1 1    Mini-Mental    Flowsheet Row Office Visit from 01/08/2024 in [facility] Office Visit from 04/20/2023 in [facility] Office Visit from 03/22/2023 in [facility]  Total Score (max 30 points ) 29 25 30    PHQ2-9    Flowsheet Row Office Visit from 01/29/2024 in [facility] Office Visit from 01/18/2024 in [facility] Office Visit from 12/21/2023 in [facility] Office Visit from 09/20/2023 in [facility] Office Visit from 09/05/2023 in [facility]  PHQ-2 Total Score 2 2 1  0 2  PHQ-9 Total Score 5 6 6 3 6    Flowsheet Row DG ARIZONA LUMBAR DX FL CT GUIDE from 01/24/2024 [facility]STIC RADIOLOG[facility] ED from 09/10/2023 in [facility] Emergency Department at [facility] ED from 02/26/2022 in [facility] Emergency Department at [facility]  C-SSRS RISK CATEGORY No Risk No Risk No Risk     Review of Systems:  Review of Systems  Musculoskeletal:  Negative for gait problem.  Neurological:  Negative for tremors.  Psychiatric/Behavioral:         Please refer to HPI    Medications: I have reviewed the patient's current medications.  Current Outpatient Medications  Medication Sig Dispense Refill   allopurinol  (ZYLOPRIM ) 300 MG tablet Take 1 tablet (300 mg total) by mouth daily. 90 tablet 1   amoxicillin -clavulanate (AUGMENTIN ) 875-125 MG tablet Take 1 tablet by mouth 2 (two) times daily. Take all of this medication 20 tablet 0   carbidopa -levodopa  (SINEMET  IR) 25-100 MG tablet Take 1 tablet by mouth 3 (three) times daily. Take med 30 minutes before a meal. 270 tablet 3   cholecalciferol (VITAMIN D ) 1000  UNITS tablet Take 1,000 Units by mouth daily.     colchicine  0.6 MG tablet Take twice daily for gout attack. (may take every two hours up to 6 doses at acute onset) 60 tablet 2   divalproex  (DEPAKOTE ) 500 MG DR tablet Take two tablets in the morning and  take two tablets at bedtime. (Patient taking differently: Take 500 mg by mouth 2 (two) times daily. Take one tablet in the morning and take one tablet at bedtime.) 360 tablet 1   donepezil  (ARICEPT ) 5 MG tablet Take 1 tablet (5 mg total) by mouth at bedtime. 90 tablet 0   famotidine  (PEPCID ) 20 MG tablet Take 1 tablet (20 mg total) by mouth 2 (two) times daily. 180 tablet 3   moxifloxacin (AVELOX) 400 MG tablet Take 1 tablet (400 mg total) by mouth daily. 10 tablet 0   pantoprazole  (PROTONIX ) 40 MG tablet Take 1 tablet (40 mg total) by mouth daily. 90 tablet 3   predniSONE  (DELTASONE ) 10 MG tablet Take 5 daily for 2 days followed by 4,3,2 and 1 for 2 days each. 30 tablet 0   Pseudoephedrine-Guaifenesin  [id number] MG TB12 Take 1 tablet by mouth 2 (two) times daily. For congestion 14 tablet 0   tamsulosin  (FLOMAX ) 0.4 MG CAPS capsule Take 2 capsules (0.8 mg total) by mouth at bedtime. For urine flow and prostate (Patient taking differently: Take 0.4 mg by mouth at bedtime. For urine flow and prostate) 180 capsule 3   traZODone  (DESYREL ) 50 MG tablet Take 1 tablet (50 mg total) by mouth at bedtime. 90 tablet 1   Vibegron (GEMTESA) 75 MG TABS Take 75 mg by mouth at bedtime.     No current facility-administered medications for this visit.    Medication Side Effects: None  Allergies:  Allergies  Allergen Reactions   Lipitor [Atorvastatin] Other (See Comments)   Niacin Other (See Comments)    Hot flash    Past Medical History:  Diagnosis Date   Allergy    Bipolar 1 disorder (HCC)    GERD (gastroesophageal reflux disease)    Hyperlipidemia    Hypertension     Past Medical History, Surgical history, Social history, and Family history were reviewed and updated as appropriate.   Please see review of systems for further details on the patient's review from today.   Objective:   Physical Exam:  There were no vitals taken for this visit.  Physical Exam Constitutional:      General: He is  not in acute distress. Musculoskeletal:        General: No deformity.  Neurological:     Mental Status: He is alert and oriented to person, place, and time.     Coordination: Coordination normal.  Psychiatric:        Attention and Perception: Attention and perception normal. He does not perceive auditory or visual hallucinations.        Mood and Affect: Mood normal. Mood is not anxious or depressed. Affect is not labile, blunt, angry or inappropriate.        Speech: Speech normal.        Behavior: Behavior normal.        Thought Content: Thought content normal. Thought content is not paranoid or delusional. Thought content does not include homicidal or suicidal ideation. Thought content does not include homicidal or suicidal plan.        Cognition and Memory: Cognition and memory normal.        Judgment: Judgment normal.  Comments: Insight intact     Lab Review:     Component Value Date/Time   NA 137 09/10/2023 2039   NA 143 09/05/2023 1629   K 4.4 09/10/2023 2039   CL 101 09/10/2023 2039   CO2 25 09/10/2023 2039   GLUCOSE 97 09/10/2023 2039   BUN 18 09/10/2023 2039   BUN 10 09/05/2023 1629   CREATININE 1.55 (H) 09/10/2023 2039   CALCIUM  9.7 09/10/2023 2039   PROT 6.8 09/10/2023 2039   PROT 6.2 09/05/2023 1629   ALBUMIN 3.6 09/10/2023 2039   ALBUMIN 3.5 (L) 09/05/2023 1629   AST 19 09/10/2023 2039   ALT <5 09/10/2023 2039   ALKPHOS 58 09/10/2023 2039   BILITOT 0.2 09/10/2023 2039   BILITOT 0.3 09/05/2023 1629   GFRNONAA 50 (L) 09/10/2023 2039   GFRAA 61 05/29/2020 0854       Component Value Date/Time   WBC 7.0 09/10/2023 2039   RBC 3.79 (L) 09/10/2023 2039   HGB 11.4 (L) 09/10/2023 2039   HGB 11.2 (L) 09/05/2023 1629   HCT 35.3 (L) 09/10/2023 2039   HCT 34.4 (L) 09/05/2023 1629   PLT 193 09/10/2023 2039   PLT 194 09/05/2023 1629   MCV 93.1 09/10/2023 2039   MCV 94 09/05/2023 1629   MCH 30.1 09/10/2023 2039   MCHC 32.3 09/10/2023 2039   RDW 14.6 09/10/2023  2039   RDW 15.1 09/05/2023 1629   LYMPHSABS 1.9 09/10/2023 2039   LYMPHSABS 1.6 09/05/2023 1629   MONOABS 0.6 09/10/2023 2039   EOSABS 0.1 09/10/2023 2039   EOSABS 0.1 09/05/2023 1629   BASOSABS 0.0 09/10/2023 2039   BASOSABS 0.0 09/05/2023 1629    Lithium  Lvl  Date Value Ref Range Status  06/09/2022 1.3 (HH) 0.5 - 1.2 mmol/L Final    Comment:    A concentration of 0.5-0.8 mmol/L is advised for long-term use; concentrations of up to 1.2 mmol/L may be necessary during acute treatment.                                  Detection Limit = 0.1                           <0.1 indicates None Detected Patient drug level exceeds published reference range.  Evaluate clinically for signs of potential toxicity.      Lab Results  Component Value Date   VALPROATE 53 12/08/2023     .res Assessment: Plan:    Plan:  PDMP reviewed  Continue:  Depakote  1000mg  daily - 500mg  BID.  Trazadone 100mg  - taking 1/2 tablet daily.  Neuropsych evaluation completed in April.  RTC 3/4 weeks   30 minutes spent dedicated to the care of this patient on the date of this encounter to include pre-visit review of records, ordering of medication, post visit documentation, and face-to-face time with the patient discussing Bipolar Disorder, GAD and insomnia. Discussed continuing current medication regimen.  Patient totally disabled an unable to work with current disabilities - mental health issues Bipolar disorder and recently diagnosed with Parkinson's with Lewy Body Dementia.   Discussed potential metabolic side effects associated with atypical antipsychotics, as well as potential risk for movement side effects. Advised pt to contact office if movement side effects occur.    Patient advised to contact office with any questions, adverse effects, or acute worsening in signs and symptoms.  There are  no diagnoses linked to this encounter.   Please see After Visit Summary for patient specific  instructions.  Future Appointments  Date Time Provider Department Center  03/21/2024  9:55 AM Zollie Lowers, MD [facility] [address]  05/30/2024 11:45 AM Jacelyn Lupita NOVAK, CCC-SLP [facility]  05/30/2024 12:00 PM Kaylene Lauraine HERO, OT [facility]  05/30/2024 12:15 PM Starlet Greig ORN, PT [facility]  06/06/2024 10:30 AM Dohmeier, Dedra, MD [facility] None    No orders of the defined types were placed in this encounter.   -------------------------------

## 2024-02-14 ENCOUNTER — Ambulatory Visit: Admitting: Psychology

## 2024-02-15 ENCOUNTER — Ambulatory Visit: Admitting: Psychology

## 2024-02-18 ENCOUNTER — Encounter: Payer: Self-pay | Admitting: Neurology

## 2024-02-18 ENCOUNTER — Encounter: Payer: Self-pay | Admitting: Family Medicine

## 2024-03-08 ENCOUNTER — Encounter: Payer: Self-pay | Admitting: Adult Health

## 2024-03-08 ENCOUNTER — Ambulatory Visit: Admitting: Adult Health

## 2024-03-08 DIAGNOSIS — F319 Bipolar disorder, unspecified: Secondary | ICD-10-CM | POA: Diagnosis not present

## 2024-03-08 DIAGNOSIS — F411 Generalized anxiety disorder: Secondary | ICD-10-CM | POA: Diagnosis not present

## 2024-03-08 DIAGNOSIS — G4700 Insomnia, unspecified: Secondary | ICD-10-CM | POA: Diagnosis not present

## 2024-03-08 NOTE — Progress Notes (Signed)
 Melvin Roberts [id number] Apr 13, 1961 63 y.o.  Subjective:   Patient ID:  Melvin Roberts is a 63 y.o. (DOB [birth year]) male.  Chief Complaint: No chief complaint on file.   HPI Melvin Roberts presents to the office today for follow-up of Bipolar Disorder, GAD and insomnia.  Accompanied by wife.   Describes mood today as ok. Mood symptoms - reports decreased depression - a little bit now and then. Reports varying interest and motivation - depends on how he feels physically. Reports situational anxiety. Reports irritability at times. Denies panic attacks. Reports some worry, over thinking and rumination. Reports mood is stable. Stating I feel like I'm doing better than I was. Taking medications as prescribed. Energy levels vary. Active, does not have a regular exercise routine - walking more.  Enjoys some usual interests and activities. Married. Lives with wife. Has 5 children and 11 grandchildren. Spending time with family. Appetite adequate. Weight loss - 225 to 230 pounds. Reports sleep is consistent. Averages 8 hours of broken sleep. Denies daytime napping. Reports focus and concentration difficulties. Managing minimal aspects of household. Completing tasks. Disabled. Denies SI or HI.  Denies AH or VH. Denies self harm. Denies substance use. Reports hearing loss.  Previous medication trials: Lithium , Seroquel    GAD-7    Flowsheet Row Office Visit from 01/29/2024 in [facility] Office Visit from 01/18/2024 in [facility] Office Visit from 12/21/2023 in [facility] Office Visit from 09/20/2023 in [facility] Office Visit from 09/05/2023 in [facility]  Total GAD-7 Score 5 2 2 1 1    Mini-Mental    Flowsheet Row Office Visit from 01/08/2024 in [facility] Office  Visit from 04/20/2023 in [facility] Office Visit from 03/22/2023 in [facility]  Total Score (max 30 points ) 29 25 30    PHQ2-9    Flowsheet Row Office Visit from 01/29/2024 in [facility] Office Visit from 01/18/2024 in [facility] Office Visit from 12/21/2023 in [facility] Office Visit from 09/20/2023 in [facility] Office Visit from 09/05/2023 in [facility]  PHQ-2 Total Score 2 2 1  0 2  PHQ-9 Total Score 5 6 6 3 6    Flowsheet Row DG [place]ONA LUMBAR DX FL CT GUIDE from 01/24/2024 in [facility] DI[facility]NOSTIC RADIOLOGY ED from 09/10/2023 in [facility] Emergency Department at [facility] ED from 02/26/2022 in [facility] Department at [facility]  C-SSRS RISK CATEGORY No Risk No Risk No Risk     Review of Systems:  Review of Systems  Musculoskeletal:  Negative for gait problem.  Neurological:  Negative for tremors.  Psychiatric/Behavioral:         Please refer to HPI    Medications: I have reviewed the patient's current medications.  Current Outpatient Medications  Medication Sig Dispense Refill   allopurinol  (ZYLOPRIM ) 300 MG tablet Take 1 tablet (300 mg total) by mouth daily. 90 tablet 1   amoxicillin -clavulanate (AUGMENTIN ) 875-125 MG tablet Take 1 tablet by mouth 2 (two) times daily. Take all of this medication 20 tablet 0   carbidopa -levodopa  (SINEMET  IR) 25-100 MG tablet Take 1 tablet by mouth 3 (three) times daily. Take med 30 minutes before a meal. 270 tablet 3  cholecalciferol (VITAMIN D ) 1000 UNITS tablet Take 1,000 Units by mouth daily.     colchicine  0.6 MG tablet Take twice daily for gout attack. (may take every two hours up to 6 doses at acute onset) 60 tablet 2   divalproex  (DEPAKOTE ) 500 MG DR tablet Take two  tablets in the morning and take two tablets at bedtime. (Patient taking differently: Take 500 mg by mouth 2 (two) times daily. Take one tablet in the morning and take one tablet at bedtime.) 360 tablet 1   donepezil  (ARICEPT ) 5 MG tablet Take 1 tablet (5 mg total) by mouth at bedtime. 90 tablet 0   famotidine  (PEPCID ) 20 MG tablet Take 1 tablet (20 mg total) by mouth 2 (two) times daily. 180 tablet 3   moxifloxacin  (AVELOX ) 400 MG tablet Take 1 tablet (400 mg total) by mouth daily. 10 tablet 0   pantoprazole  (PROTONIX ) 40 MG tablet Take 1 tablet (40 mg total) by mouth daily. 90 tablet 3   predniSONE  (DELTASONE ) 10 MG tablet Take 5 daily for 2 days followed by 4,3,2 and 1 for 2 days each. 30 tablet 0   Pseudoephedrine -Guaifenesin  [id number] MG TB12 Take 1 tablet by mouth 2 (two) times daily. For congestion 14 tablet 0   tamsulosin  (FLOMAX ) 0.4 MG CAPS capsule Take 2 capsules (0.8 mg total) by mouth at bedtime. For urine flow and prostate (Patient taking differently: Take 0.4 mg by mouth at bedtime. For urine flow and prostate) 180 capsule 3   traZODone  (DESYREL ) 50 MG tablet Take 1 tablet (50 mg total) by mouth at bedtime. 90 tablet 1   Vibegron (GEMTESA) 75 MG TABS Take 75 mg by mouth at bedtime.     No current facility-administered medications for this visit.    Medication Side Effects: None  Allergies:  Allergies  Allergen Reactions   Lipitor [Atorvastatin] Other (See Comments)   Niacin Other (See Comments)    Hot flash    Past Medical History:  Diagnosis Date   Allergy    Bipolar 1 disorder (HCC)    GERD (gastroesophageal reflux disease)    Hyperlipidemia    Hypertension     Past Medical History, Surgical history, Social history, and Family history were reviewed and updated as appropriate.   Please see review of systems for further details on the patient's review from today.   Objective:   Physical Exam:  There were no vitals taken for this visit.  Physical  Exam Constitutional:      General: He is not in acute distress. Musculoskeletal:        General: No deformity.  Neurological:     Mental Status: He is alert and oriented to person, place, and time.     Coordination: Coordination normal.  Psychiatric:        Attention and Perception: Attention and perception normal. He does not perceive auditory or visual hallucinations.        Mood and Affect: Mood normal. Mood is not anxious or depressed. Affect is not labile, blunt, angry or inappropriate.        Speech: Speech normal.        Behavior: Behavior normal.        Thought Content: Thought content normal. Thought content is not paranoid or delusional. Thought content does not include homicidal or suicidal ideation. Thought content does not include homicidal or suicidal plan.        Cognition and Memory: Cognition and memory normal.        Judgment:  Judgment normal.     Comments: Insight intact     Lab Review:     Component Value Date/Time   NA 137 09/10/2023 2039   NA 143 09/05/2023 1629   K 4.4 09/10/2023 2039   CL 101 09/10/2023 2039   CO2 25 09/10/2023 2039   GLUCOSE 97 09/10/2023 2039   BUN 18 09/10/2023 2039   BUN 10 09/05/2023 1629   CREATININE 1.55 (H) 09/10/2023 2039   CALCIUM  9.7 09/10/2023 2039   PROT 6.8 09/10/2023 2039   PROT 6.2 09/05/2023 1629   ALBUMIN 3.6 09/10/2023 2039   ALBUMIN 3.5 (L) 09/05/2023 1629   AST 19 09/10/2023 2039   ALT <5 09/10/2023 2039   ALKPHOS 58 09/10/2023 2039   BILITOT 0.2 09/10/2023 2039   BILITOT 0.3 09/05/2023 1629   GFRNONAA 50 (L) 09/10/2023 2039   GFRAA 61 05/29/2020 0854       Component Value Date/Time   WBC 7.0 09/10/2023 2039   RBC 3.79 (L) 09/10/2023 2039   HGB 11.4 (L) 09/10/2023 2039   HGB 11.2 (L) 09/05/2023 1629   HCT 35.3 (L) 09/10/2023 2039   HCT 34.4 (L) 09/05/2023 1629   PLT 193 09/10/2023 2039   PLT 194 09/05/2023 1629   MCV 93.1 09/10/2023 2039   MCV 94 09/05/2023 1629   MCH 30.1 09/10/2023 2039   MCHC  32.3 09/10/2023 2039   RDW 14.6 09/10/2023 2039   RDW 15.1 09/05/2023 1629   LYMPHSABS 1.9 09/10/2023 2039   LYMPHSABS 1.6 09/05/2023 1629   MONOABS 0.6 09/10/2023 2039   EOSABS 0.1 09/10/2023 2039   EOSABS 0.1 09/05/2023 1629   BASOSABS 0.0 09/10/2023 2039   BASOSABS 0.0 09/05/2023 1629    Lithium  Lvl  Date Value Ref Range Status  06/09/2022 1.3 (HH) 0.5 - 1.2 mmol/L Final    Comment:    A concentration of 0.5-0.8 mmol/L is advised for long-term use; concentrations of up to 1.2 mmol/L may be necessary during acute treatment.                                  Detection Limit = 0.1                           <0.1 indicates None Detected Patient drug level exceeds published reference range.  Evaluate clinically for signs of potential toxicity.      Lab Results  Component Value Date   VALPROATE 53 12/08/2023     .res Assessment: Plan:    Plan:  PDMP reviewed  Continue:  Depakote  1000mg  daily - 500mg  BID.  Trazadone 100mg  - taking 1/2 tablet daily.  Neuropsych evaluation completed in April.  RTC 3/4 weeks   30 minutes spent dedicated to the care of this patient on the date of this encounter to include pre-visit review of records, ordering of medication, post visit documentation, and face-to-face time with the patient discussing Bipolar Disorder, GAD and insomnia. Discussed continuing current medication regimen.  Patient totally disabled an unable to work with current disabilities - mental health issues Bipolar disorder and recently diagnosed with Parkinson's with Lewy Body Dementia.   Discussed potential metabolic side effects associated with atypical antipsychotics, as well as potential risk for movement side effects. Advised pt to contact office if movement side effects occur.    Patient advised to contact office with any questions, adverse effects, or acute worsening in  signs and symptoms.  There are no diagnoses linked to this encounter.   Please see After  Visit Summary for patient specific instructions.  Future Appointments  Date Time Provider Department Center  03/21/2024  9:55 AM Zollie Lowers, MD [facility] [address]  05/30/2024 11:45 AM Jacelyn Lupita NOVAK, CCC-SLP [facility]  05/30/2024 12:00 PM Kaylene Lauraine HERO, OT [facility]  05/30/2024 12:15 PM Starlet Greig ORN, PT [facility]  06/06/2024 10:30 AM Dohmeier, Dedra, MD [facility] None    No orders of the defined types were placed in this encounter.   -------------------------------

## 2024-03-21 ENCOUNTER — Encounter: Payer: Self-pay | Admitting: Family Medicine

## 2024-03-21 ENCOUNTER — Ambulatory Visit: Payer: Self-pay | Admitting: Family Medicine

## 2024-03-21 DIAGNOSIS — M1A0790 Idiopathic chronic gout, unspecified ankle and foot, without tophus (tophi): Secondary | ICD-10-CM

## 2024-03-21 MED ORDER — ALLOPURINOL 300 MG PO TABS: 300.0000 mg | ORAL_TABLET | Freq: Every day | ORAL | 1 refills | Status: AC

## 2024-03-21 MED ORDER — COLCHICINE 0.6 MG PO TABS: ORAL_TABLET | ORAL | 2 refills | Status: AC

## 2024-03-21 NOTE — Progress Notes (Addendum)
 Subjective:  Patient ID: Melvin Roberts, male    DOB: 03/14/1961  Age: 63 y.o. MRN: [id number]  CC: Medical Management of Chronic Issues   HPI  Discussed the use of AI scribe software for clinical note transcription with the patient, who gave verbal consent to proceed.  History of Present Illness Melvin Roberts is a 63 year old male who presents for medication management and follow-up.  He has experienced improvement in his overall condition following adjustments to his medication regimen. Previously, he was on Sinemet  (carbidopa /levodopa ) for symptoms resembling Parkinson's disease but has since been tapered off due to side effects and uncertainty about the diagnosis. Initially, he experienced withdrawal symptoms such as body aches, but he now feels better and believes the medication is out of his system.  He continues to take Depakote  for bipolar disorder, donepezil  for memory support, trazodone  for sleep, and tamsulosin  for urinary flow. These medications are reported to be effective. He has not experienced any recent gout attacks and is on medication to prevent them.  He is almost able to walk normally and only uses a cane for safety. No recent falls have occurred, and he feels his risk of falling has decreased since stopping Sinemet . His memory is 'not bad' for his age, though he occasionally forgets names.  He is also taking pantoprazole  and famotidine  for reflux and heartburn, which he states are effective.          03/21/2024    9:56 AM 01/29/2024    9:19 AM 01/18/2024   10:40 AM  Depression screen PHQ 2/9  Decreased Interest 1 1 1   Down, Depressed, Hopeless 0 1 1  PHQ - 2 Score 1 2 2   Altered sleeping 1 0 1  Tired, decreased energy 1 1 1   Change in appetite 0 0 0  Feeling bad or failure about yourself  1 1 1   Trouble concentrating 0 1 1  Moving slowly or fidgety/restless 0 0 0  Suicidal thoughts 0 0 0  PHQ-9 Score 4 5  6    Difficult doing  work/chores Not difficult at all Not difficult at all Not difficult at all     Data saved with a previous flowsheet row definition    History Melvin Roberts has a past medical history of Allergy, Bipolar 1 disorder (HCC), GERD (gastroesophageal reflux disease), Hyperlipidemia, and Hypertension.   He has a past surgical history that includes Back surgery (11/17/1995); Hernia repair (04/19/2007); Eye surgery; Upper gastrointestinal endoscopy (x2); and Colonoscopy.   His family history includes Cancer in his father, maternal grandfather, mother, and paternal grandfather; Esophageal cancer in his maternal grandfather.He reports that he has never smoked. He has never used smokeless tobacco. He reports that he does not currently use alcohol. He reports that he does not use drugs.    ROS Review of Systems  Constitutional: Negative.   HENT: Negative.    Eyes:  Negative for visual disturbance.  Respiratory:  Negative for cough and shortness of breath.   Cardiovascular:  Negative for chest pain and leg swelling.  Gastrointestinal:  Negative for abdominal pain, diarrhea, nausea and vomiting.  Genitourinary:  Negative for difficulty urinating.  Musculoskeletal:  Negative for arthralgias and myalgias.  Skin:  Negative for rash.  Neurological:  Negative for headaches.  Psychiatric/Behavioral:  Negative for sleep disturbance.     Objective:  BP 117/74   Pulse 68   Temp 98 F (36.7 C)   Ht 5' 11 (1.803 m)   Wt 236 lb (  107 kg)   SpO2 96%   BMI 32.92 kg/m   BP Readings from Last 3 Encounters:  03/21/24 117/74  01/29/24 120/72  01/24/24 (!) 127/90    Wt Readings from Last 3 Encounters:  03/21/24 236 lb (107 kg)  01/29/24 231 lb (104.8 kg)  01/24/24 228 lb (103.4 kg)     Physical Exam Physical Exam GENERAL: Alert, cooperative, well developed, no acute distress. HEENT: Normocephalic, normal oropharynx, moist mucous membranes. CHEST: Clear to auscultation bilaterally, no wheezes,  rhonchi, or crackles. CARDIOVASCULAR: Normal heart rate and rhythm, S1 and S2 normal without murmurs. ABDOMEN: Soft, non-tender, non-distended, without organomegaly, normal bowel sounds. EXTREMITIES: No cyanosis or edema. NEUROLOGICAL: Cranial nerves grossly intact, moves all extremities without gross motor or sensory deficit. Just slight cogwheeling in wrists noted. Slight wide base gait with slightly smaller steps.   Assessment & Plan:  Benign prostatic hyperplasia with urinary frequency  Chronic idiopathic gout involving toe without tophus, unspecified laterality -     Allopurinol ; Take 1 tablet (300 mg total) by mouth daily.  Dispense: 90 tablet; Refill: 1 -     Colchicine ; Take twice daily for gout attack. (may take every two hours up to 6 doses at acute onset)  Dispense: 60 tablet; Refill: 2  Bipolar disorder, in full remission, most recent episode hypomanic  Gastroesophageal reflux disease with esophagitis without hemorrhage    Assessment and Plan Assessment & Plan Drug-induced gout, right ankle and foot   No recent gout attacks reported. Current medication regimen effectively prevents flares. Continue current medication regimen.  Bipolar disorder   Managed by neurologist with Depakote . Medication adjustments may reduce symptoms. Continue current regimen as managed by neurologist.  Urinary frequency   Tamsulosin  effectively improves urine flow and reduces frequency. Continue tamsulosin .  Gastroesophageal reflux disease   Pantoprazole  and famotidine  effectively control symptoms. Continue both medications.  Memory impairment   Managed with donepezil . Memory is not significantly impaired for age. Continue donepezil .  General Health Maintenance   Schedule follow-up appointment in six months for a head-to-toe physical examination.       Follow-up: Return in about 6 months (around 09/19/2024) for [facility]ete physical.  [name], M.D.

## 2024-04-02 ENCOUNTER — Ambulatory Visit: Admitting: Family Medicine

## 2024-04-02 ENCOUNTER — Encounter: Payer: Self-pay | Admitting: Family Medicine

## 2024-04-02 VITALS — BP 122/78 | HR 62 | Temp 97.9°F | Ht 71.0 in | Wt 241.0 lb

## 2024-04-02 DIAGNOSIS — J0100 Acute maxillary sinusitis, unspecified: Secondary | ICD-10-CM

## 2024-04-02 MED ORDER — PSEUDOEPHEDRINE-GUAIFENESIN ER 120-1200 MG PO TB12: 1.0000 | ORAL_TABLET | Freq: Two times a day (BID) | ORAL | 0 refills | Status: AC

## 2024-04-02 MED ORDER — MOXIFLOXACIN HCL 400 MG PO TABS: 400.0000 mg | ORAL_TABLET | Freq: Every day | ORAL | 0 refills | Status: AC

## 2024-04-02 NOTE — Progress Notes (Signed)
 Subjective:  Patient ID: Melvin Roberts, male    DOB: 04/10/1961  Age: 63 y.o. MRN: [id number]  CC: sinus issue (Chronic sinus issue. Seen Ent before. Worse the past 4 days. Teeth hurt, headache, earache, and sinus pressure. Thinks he needs abx again. )   HPI  Discussed the use of AI scribe software for clinical note transcription with the patient, who gave verbal consent to proceed.  History of Present Illness Melvin Roberts is a 63 year old male with chronic sinus issues who presents with sinus pain and congestion.  He has been experiencing sinus pain for the past four days, primarily located in the frontal sinus area above his eyes, radiating to his teeth and ears. The pain is similar to previous episodes but not as severe as the last occurrence. He typically manages his sinus infections with antibiotics and sometimes steroids and decongestants, which provide relief for several months before symptoms recur.  In the review of symptoms, he notes bilateral ear pain and confirms the location of his head pain in the frontal sinus area.  His family history includes his mother, who recently passed away at the age of 86 due to heart problems. This recent loss has been emotionally challenging for him, especially during the holiday season.          04/02/2024    9:56 AM 03/21/2024    9:56 AM 01/29/2024    9:19 AM  Depression screen PHQ 2/9  Decreased Interest 1 1 1   Down, Depressed, Hopeless 1 0 1  PHQ - 2 Score 2 1 2   Altered sleeping 0 1 0  Tired, decreased energy 1 1 1   Change in appetite 0 0 0  Feeling bad or failure about yourself  1 1 1   Trouble concentrating 1 0 1  Moving slowly or fidgety/restless 0 0 0  Suicidal thoughts 0 0 0  PHQ-9 Score 5 4 5    Difficult doing work/chores Not difficult at all Not difficult at all Not difficult at all     Data saved with a previous flowsheet row definition    History Melvin Roberts has a past medical history of Allergy,  Bipolar 1 disorder (HCC), GERD (gastroesophageal reflux disease), Hyperlipidemia, and Hypertension.   He has a past surgical history that includes Back surgery (11/17/1995); Hernia repair (04/19/2007); Eye surgery; Upper gastrointestinal endoscopy (x2); and Colonoscopy.   His family history includes Cancer in his father, maternal grandfather, mother, and paternal grandfather; Esophageal cancer in his maternal grandfather.He reports that he has never smoked. He has never used smokeless tobacco. He reports that he does not currently use alcohol. He reports that he does not use drugs.    ROS Review of Systems  Objective:  BP 122/78   Pulse 62   Temp 97.9 F (36.6 C)   Ht 5' 11 (1.803 m)   Wt 241 lb (109.3 kg)   SpO2 97%   BMI 33.61 kg/m   BP Readings from Last 3 Encounters:  04/02/24 122/78  03/21/24 117/74  01/29/24 120/72    Wt Readings from Last 3 Encounters:  04/02/24 241 lb (109.3 kg)  03/21/24 236 lb (107 kg)  01/29/24 231 lb (104.8 kg)     Physical Exam Physical Exam GENERAL: Alert, cooperative, well developed, no acute distress HEENT: Normocephalic, normal oropharynx, moist mucous membranes CHEST: Clear to auscultation bilaterally, No wheezes, rhonchi, or crackles CARDIOVASCULAR: Normal heart rate and rhythm, S1 and S2 normal without murmurs ABDOMEN: Soft, non-tender, non-distended, without organomegaly,  Normal bowel sounds EXTREMITIES: No cyanosis or edema NEUROLOGICAL: Cranial nerves grossly intact, Moves all extremities without gross motor or sensory deficit   Assessment & Plan:  Acute maxillary sinusitis, recurrence not specified  Other orders -     Moxifloxacin  HCl; Take 1 tablet (400 mg total) by mouth daily.  Dispense: 10 tablet; Refill: 0 -     Pseudoephedrine -guaiFENesin  ER; Take 1 tablet by mouth 2 (two) times daily. For congestion  Dispense: 20 tablet; Refill: 0    Assessment and Plan Assessment & Plan Acute recurrent sinusitis   He  experiences chronic sinus issues with recurrent sinusitis episodes. The current episode involves pain in the frontal sinuses, teeth, and ears, but symptoms are less severe than before. An ENT consultation previously advised against septum surgery due to poor outcomes. Current management includes antibiotics and decongestants. He should monitor for potential side effects such as heart palpitations and elevated blood pressure from decongestants, and gastrointestinal discomfort from antibiotics.       Follow-up: Return if symptoms worsen or fail to improve.  [name], M.D.

## 2024-04-29 ENCOUNTER — Telehealth: Admitting: Adult Health

## 2024-04-29 ENCOUNTER — Encounter: Payer: Self-pay | Admitting: Adult Health

## 2024-04-29 DIAGNOSIS — G4700 Insomnia, unspecified: Secondary | ICD-10-CM

## 2024-04-29 DIAGNOSIS — F319 Bipolar disorder, unspecified: Secondary | ICD-10-CM | POA: Diagnosis not present

## 2024-04-29 DIAGNOSIS — F411 Generalized anxiety disorder: Secondary | ICD-10-CM

## 2024-04-29 NOTE — Progress Notes (Signed)
 ZANDON TALTON [id number] August 09, 1960 64 y.o.  Virtual Visit via Video Note  I connected with pt @ on 04/29/2024 at 11:30 AM EST by a video enabled telemedicine application and verified that I am speaking with the correct person using two identifiers.   I discussed the limitations of evaluation and management by telemedicine and the availability of in person appointments. The patient expressed understanding and agreed to proceed.  I discussed the assessment and treatment plan with the patient. The patient was provided an opportunity to ask questions and all were answered. The patient agreed with the plan and demonstrated an understanding of the instructions.   The patient was advised to call back or seek an in-person evaluation if the symptoms worsen or if the condition fails to improve as anticipated.  I provided 25 minutes of non-face-to-face time during this encounter.  The patient was located at home.  The provider was located at [facility].   Angeline LOISE Sayers, NP   Subjective:   Patient ID:  Melvin Roberts is a 64 y.o. (DOB 03-24-1961) male.  Chief Complaint: No chief complaint on file.   HPI Mauro D Stickles presents for follow-up of Bipolar Disorder, GAD and insomnia.  Accompanied by wife - contributing to interview.   Describes mood today as ok. Mood symptoms - reports some depression with mother over the holidays. Reports lower interest and motivation. Reports feeling anxious at times. Reports irritability at times. Denies panic attacks. Reports some worry, over thinking and rumination. Reports losing mother over the holidays. Reports mood is variable - some ups and downs. Stating I feel like I'm hanging in there ok. Taking medications as prescribed. Energy levels ok, not great. Active, does not have a regular exercise routine - has been walking more, but is sick. Reports he is unable to enjoy usual interests and activities - I'm not to bad  off. Married. Lives with wife. Has 5 children and 11 grandchildren. Spending time with family. Appetite adequate. Weight gain - 225 to 230 pounds. Reports sleep is consistent. Averages 8 or more hours. Reports nightmares. Denies daytime napping. Reports focus and concentration difficulties - sometimes, not to bad. Managing minimal aspects of household. Completing tasks. Disabled. Denies SI or HI.  Denies AH or VH. Denies self harm. Denies substance use. Reports hearing loss.  Previous medication trials: Lithium , Seroquel   Review of Systems:  Review of Systems  Musculoskeletal:  Negative for gait problem.  Neurological:  Negative for tremors.  Psychiatric/Behavioral:         Please refer to HPI    Medications: I have reviewed the patient's current medications.  Current Outpatient Medications  Medication Sig Dispense Refill   allopurinol  (ZYLOPRIM ) 300 MG tablet Take 1 tablet (300 mg total) by mouth daily. 90 tablet 1   cholecalciferol (VITAMIN D ) 1000 UNITS tablet Take 1,000 Units by mouth daily.     colchicine  0.6 MG tablet Take twice daily for gout attack. (may take every two hours up to 6 doses at acute onset) 60 tablet 2   divalproex  (DEPAKOTE ) 500 MG DR tablet Take two tablets in the morning and take two tablets at bedtime. 360 tablet 1   donepezil  (ARICEPT ) 5 MG tablet Take 1 tablet (5 mg total) by mouth at bedtime. 90 tablet 0   famotidine  (PEPCID ) 20 MG tablet Take 1 tablet (20 mg total) by mouth 2 (two) times daily. 180 tablet 3   moxifloxacin  (AVELOX ) 400 MG tablet Take 1 tablet (400 mg total) by mouth  daily. 10 tablet 0   pantoprazole  (PROTONIX ) 40 MG tablet Take 1 tablet (40 mg total) by mouth daily. 90 tablet 3   Pseudoephedrine -Guaifenesin  [phone] MG TB12 Take 1 tablet by mouth 2 (two) times daily. For congestion 20 tablet 0   tamsulosin  (FLOMAX ) 0.4 MG CAPS capsule Take 2 capsules (0.8 mg total) by mouth at bedtime. For urine flow and prostate 180 capsule 3    traZODone  (DESYREL ) 50 MG tablet Take 1 tablet (50 mg total) by mouth at bedtime. 90 tablet 1   Vibegron (GEMTESA) 75 MG TABS Take 75 mg by mouth at bedtime.     No current facility-administered medications for this visit.    Medication Side Effects: None  Allergies: Allergies[1]  Past Medical History:  Diagnosis Date   Allergy    Bipolar 1 disorder (HCC)    GERD (gastroesophageal reflux disease)    Hyperlipidemia    Hypertension     Family History  Problem Relation Age of Onset   Cancer Mother        breast   Cancer Father        lug   Esophageal cancer Maternal Grandfather    Cancer Maternal Grandfather    Cancer Paternal Grandfather    Colon cancer Neg Hx    Colon polyps Neg Hx    Rectal cancer Neg Hx    Stomach cancer Neg Hx     Social History   Socioeconomic History   Marital status: Married    Spouse name: Not on file   Number of children: Not on file   Years of education: Not on file   Highest education level: Associate degree: occupational, scientist, product/process development, or vocational program  Occupational History   Not on file  Tobacco Use   Smoking status: Never   Smokeless tobacco: Never  Vaping Use   Vaping status: Never Used  Substance and Sexual Activity   Alcohol use: Not Currently   Drug use: No   Sexual activity: Not on file  Other Topics Concern   Not on file  Social History Narrative   Not on file   Social Drivers of Health   Tobacco Use: Low Risk (04/02/2024)   Patient History    Smoking Tobacco Use: Never    Smokeless Tobacco Use: Never    Passive Exposure: Not on file  Financial Resource Strain: Low Risk (03/20/2024)   Overall Financial Resource Strain (CARDIA)    Difficulty of Paying Living Expenses: Not hard at all  Food Insecurity: No Food Insecurity (03/20/2024)   Epic    Worried About Radiation Protection Practitioner of Food in the Last Year: Never true    Ran Out of Food in the Last Year: Never true  Transportation Needs: No Transportation Needs (03/20/2024)    Epic    Lack of Transportation (Medical): No    Lack of Transportation (Non-Medical): No  Physical Activity: Insufficiently Active (03/20/2024)   Exercise Vital Sign    Days of Exercise per Week: 1 day    Minutes of Exercise per Session: 30 min  Stress: Stress Concern Present (03/20/2024)   [employer] of Occupational Health - Occupational Stress Questionnaire    Feeling of Stress: To some extent  Social Connections: Socially Integrated (03/20/2024)   Social Connection and Isolation Panel    Frequency of Communication with Friends and Family: More than three times a week    Frequency of Social Gatherings with Friends and Family: Once a week    Attends Religious Services: More than 4  times per year    Active Member of Clubs or Organizations: Yes    Attends Banker Meetings: More than 4 times per year    Marital Status: Married  Catering Manager Violence: Not At Risk (12/21/2023)   Epic    Fear of Current or Ex-Partner: No    Emotionally Abused: No    Physically Abused: No    Sexually Abused: No  Depression (PHQ2-9): Medium Risk (04/02/2024)   Depression (PHQ2-9)    PHQ-2 Score: 5  Alcohol Screen: Not on file  Housing: Unknown (03/20/2024)   Epic    Unable to Pay for Housing in the Last Year: No    Number of Times Moved in the Last Year: Not on file    Homeless in the Last Year: No  Utilities: Not At Risk (12/21/2023)   Epic    Threatened with loss of utilities: No  Health Literacy: Not on file    Past Medical History, Surgical history, Social history, and Family history were reviewed and updated as appropriate.   Please see review of systems for further details on the patient's review from today.   Objective:   Physical Exam:  There were no vitals taken for this visit.  Physical Exam Constitutional:      General: He is not in acute distress. Musculoskeletal:        General: No deformity.  Neurological:     Mental Status: He is alert and oriented to  person, place, and time.     Coordination: Coordination normal.  Psychiatric:        Attention and Perception: Attention and perception normal. He does not perceive auditory or visual hallucinations.        Mood and Affect: Mood normal. Mood is not anxious or depressed. Affect is not labile, blunt, angry or inappropriate.        Speech: Speech normal.        Behavior: Behavior normal.        Thought Content: Thought content normal. Thought content is not paranoid or delusional. Thought content does not include homicidal or suicidal ideation. Thought content does not include homicidal or suicidal plan.        Cognition and Memory: Cognition and memory normal.        Judgment: Judgment normal.     Comments: Insight intact     Lab Review:     Component Value Date/Time   NA 137 09/10/2023 2039   NA 143 09/05/2023 1629   K 4.4 09/10/2023 2039   CL 101 09/10/2023 2039   CO2 25 09/10/2023 2039   GLUCOSE 97 09/10/2023 2039   BUN 18 09/10/2023 2039   BUN 10 09/05/2023 1629   CREATININE 1.55 (H) 09/10/2023 2039   CALCIUM  9.7 09/10/2023 2039   PROT 6.8 09/10/2023 2039   PROT 6.2 09/05/2023 1629   ALBUMIN 3.6 09/10/2023 2039   ALBUMIN 3.5 (L) 09/05/2023 1629   AST 19 09/10/2023 2039   ALT <5 09/10/2023 2039   ALKPHOS 58 09/10/2023 2039   BILITOT 0.2 09/10/2023 2039   BILITOT 0.3 09/05/2023 1629   GFRNONAA 50 (L) 09/10/2023 2039   GFRAA 61 05/29/2020 0854       Component Value Date/Time   WBC 7.0 09/10/2023 2039   RBC 3.79 (L) 09/10/2023 2039   HGB 11.4 (L) 09/10/2023 2039   HGB 11.2 (L) 09/05/2023 1629   HCT 35.3 (L) 09/10/2023 2039   HCT 34.4 (L) 09/05/2023 1629   PLT 193 09/10/2023 2039  PLT 194 09/05/2023 1629   MCV 93.1 09/10/2023 2039   MCV 94 09/05/2023 1629   MCH 30.1 09/10/2023 2039   MCHC 32.3 09/10/2023 2039   RDW 14.6 09/10/2023 2039   RDW 15.1 09/05/2023 1629   LYMPHSABS 1.9 09/10/2023 2039   LYMPHSABS 1.6 09/05/2023 1629   MONOABS 0.6 09/10/2023 2039    EOSABS 0.1 09/10/2023 2039   EOSABS 0.1 09/05/2023 1629   BASOSABS 0.0 09/10/2023 2039   BASOSABS 0.0 09/05/2023 1629    Lithium  Lvl  Date Value Ref Range Status  06/09/2022 1.3 (HH) 0.5 - 1.2 mmol/L Final    Comment:    A concentration of 0.5-0.8 mmol/L is advised for long-term use; concentrations of up to 1.2 mmol/L may be necessary during acute treatment.                                  Detection Limit = 0.1                           <0.1 indicates None Detected Patient drug level exceeds published reference range.  Evaluate clinically for signs of potential toxicity.      Lab Results  Component Value Date   VALPROATE 53 12/08/2023     .res Assessment: Plan:   Plan:  PDMP reviewed  Continue: Depakote  1000mg  daily - 500mg  BID.  Change: Trazadone 100mg  - taking 1/2 tablet daily as needed  Neuropsych evaluation completed in April.  RTC 3/4 weeks   30 minutes spent dedicated to the care of this patient on the date of this encounter to include pre-visit review of records, ordering of medication, post visit documentation, and face-to-face time with the patient discussing Bipolar Disorder, GAD and insomnia. Discussed continuing current medication regimen.  Patient totally disabled an unable to work with current disabilities - mental health issues Bipolar disorder and recently diagnosed with Parkinson's with Lewy Body Dementia.   Discussed potential metabolic side effects associated with atypical antipsychotics, as well as potential risk for movement side effects. Advised pt to contact office if movement side effects occur.    Patient advised to contact office with any questions, adverse effects, or acute worsening in signs and symptoms.  There are no diagnoses linked to this encounter.   Please see After Visit Summary for patient specific instructions.  Future Appointments  Date Time Provider Department Center  04/29/2024 11:30 AM Torryn Hudspeth, Angeline Mattocks, NP [facility]  None  05/30/2024 11:45 AM Jacelyn Lupita NOVAK, CCC-SLP [facility]  05/30/2024 12:00 PM Kaylene Lauraine HERO, OT [facility]  05/30/2024 12:15 PM Starlet Greig ORN, PT [facility]  06/06/2024 10:30 AM Dohmeier, Dedra, MD [facility] None  09/19/2024  9:55 AM Zollie Lowers, MD [facility] [phone] [address]    No orders of the defined types were placed in this encounter.     -------------------------------     [1]  Allergies Allergen Reactions   Lipitor [Atorvastatin] Other (See Comments)   Niacin Other (See Comments)    Hot flash

## 2024-05-30 ENCOUNTER — Ambulatory Visit: Admitting: Occupational Therapy

## 2024-05-30 ENCOUNTER — Ambulatory Visit

## 2024-05-30 ENCOUNTER — Ambulatory Visit: Admitting: Physical Therapy

## 2024-06-05 ENCOUNTER — Ambulatory Visit: Admitting: Adult Health

## 2024-06-06 ENCOUNTER — Ambulatory Visit: Admitting: Neurology

## 2024-09-19 ENCOUNTER — Encounter: Admitting: Family Medicine
